# Patient Record
Sex: Female | Born: 1950 | Race: White | Hispanic: No | Smoking: Former smoker
Health system: Southern US, Community
[De-identification: ages and names within clinical notes are randomized; demographics above are authoritative.]

## PROBLEM LIST (undated history)

## (undated) DIAGNOSIS — I6523 Occlusion and stenosis of bilateral carotid arteries: Secondary | ICD-10-CM

## (undated) DIAGNOSIS — I639 Cerebral infarction, unspecified: Secondary | ICD-10-CM

## (undated) DIAGNOSIS — I5189 Other ill-defined heart diseases: Secondary | ICD-10-CM

## (undated) DIAGNOSIS — E785 Hyperlipidemia, unspecified: Secondary | ICD-10-CM

## (undated) DIAGNOSIS — I471 Supraventricular tachycardia: Secondary | ICD-10-CM

## (undated) DIAGNOSIS — F419 Anxiety disorder, unspecified: Secondary | ICD-10-CM

## (undated) DIAGNOSIS — E119 Type 2 diabetes mellitus without complications: Secondary | ICD-10-CM

## (undated) DIAGNOSIS — I259 Chronic ischemic heart disease, unspecified: Secondary | ICD-10-CM

## (undated) DIAGNOSIS — I509 Heart failure, unspecified: Secondary | ICD-10-CM

## (undated) DIAGNOSIS — H409 Unspecified glaucoma: Secondary | ICD-10-CM

## (undated) DIAGNOSIS — I2510 Atherosclerotic heart disease of native coronary artery without angina pectoris: Secondary | ICD-10-CM

## (undated) DIAGNOSIS — I10 Essential (primary) hypertension: Secondary | ICD-10-CM

## (undated) DIAGNOSIS — H3500 Unspecified background retinopathy: Secondary | ICD-10-CM

## (undated) DIAGNOSIS — T7840XA Allergy, unspecified, initial encounter: Secondary | ICD-10-CM

## (undated) DIAGNOSIS — M1990 Unspecified osteoarthritis, unspecified site: Secondary | ICD-10-CM

## (undated) DIAGNOSIS — F32A Depression, unspecified: Secondary | ICD-10-CM

## (undated) DIAGNOSIS — M810 Age-related osteoporosis without current pathological fracture: Secondary | ICD-10-CM

## (undated) DIAGNOSIS — D649 Anemia, unspecified: Secondary | ICD-10-CM

## (undated) DIAGNOSIS — G43909 Migraine, unspecified, not intractable, without status migrainosus: Secondary | ICD-10-CM

## (undated) DIAGNOSIS — F329 Major depressive disorder, single episode, unspecified: Secondary | ICD-10-CM

## (undated) DIAGNOSIS — H269 Unspecified cataract: Secondary | ICD-10-CM

## (undated) DIAGNOSIS — J45909 Unspecified asthma, uncomplicated: Secondary | ICD-10-CM

## (undated) DIAGNOSIS — G5730 Lesion of lateral popliteal nerve, unspecified lower limb: Secondary | ICD-10-CM

## (undated) DIAGNOSIS — Z87440 Personal history of urinary (tract) infections: Secondary | ICD-10-CM

## (undated) DIAGNOSIS — R011 Cardiac murmur, unspecified: Secondary | ICD-10-CM

## (undated) DIAGNOSIS — K219 Gastro-esophageal reflux disease without esophagitis: Secondary | ICD-10-CM

## (undated) HISTORY — DX: Unspecified cataract: H26.9

## (undated) HISTORY — DX: Supraventricular tachycardia: I47.1

## (undated) HISTORY — DX: Unspecified background retinopathy: H35.00

## (undated) HISTORY — DX: Lesion of lateral popliteal nerve, unspecified lower limb: G57.30

## (undated) HISTORY — DX: Atherosclerotic heart disease of native coronary artery without angina pectoris: I25.10

## (undated) HISTORY — DX: Occlusion and stenosis of bilateral carotid arteries: I65.23

## (undated) HISTORY — DX: Depression, unspecified: F32.A

## (undated) HISTORY — DX: Anemia, unspecified: D64.9

## (undated) HISTORY — DX: Major depressive disorder, single episode, unspecified: F32.9

## (undated) HISTORY — PX: VENTRAL HERNIA REPAIR: SHX424

## (undated) HISTORY — DX: Allergy, unspecified, initial encounter: T78.40XA

## (undated) HISTORY — DX: Morbid (severe) obesity due to excess calories: E66.01

## (undated) HISTORY — DX: Essential (primary) hypertension: I10

## (undated) HISTORY — DX: Unspecified asthma, uncomplicated: J45.909

## (undated) HISTORY — DX: Cerebral infarction, unspecified: I63.9

## (undated) HISTORY — PX: CHOLECYSTECTOMY: SHX55

## (undated) HISTORY — PX: RADIOFREQUENCY ABLATION: SHX2290

## (undated) HISTORY — DX: Anxiety disorder, unspecified: F41.9

## (undated) HISTORY — DX: Cardiac murmur, unspecified: R01.1

## (undated) HISTORY — PX: VAGINAL HYSTERECTOMY: SUR661

## (undated) HISTORY — DX: Hypocalcemia: E83.51

## (undated) HISTORY — DX: Type 2 diabetes mellitus without complications: E11.9

## (undated) HISTORY — DX: Chronic ischemic heart disease, unspecified: I25.9

## (undated) HISTORY — DX: Hyperlipidemia, unspecified: E78.5

## (undated) HISTORY — PX: APPENDECTOMY: SHX54

## (undated) HISTORY — DX: Other ill-defined heart diseases: I51.89

## (undated) HISTORY — PX: SMALL INTESTINE SURGERY: SHX150

## (undated) HISTORY — DX: Migraine, unspecified, not intractable, without status migrainosus: G43.909

## (undated) HISTORY — DX: Heart failure, unspecified: I50.9

## (undated) HISTORY — DX: Gastro-esophageal reflux disease without esophagitis: K21.9

## (undated) HISTORY — PX: HERNIA REPAIR: SHX51

## (undated) HISTORY — DX: Age-related osteoporosis without current pathological fracture: M81.0

## (undated) HISTORY — DX: Unspecified glaucoma: H40.9

## (undated) HISTORY — DX: Personal history of urinary (tract) infections: Z87.440

## (undated) NOTE — *Deleted (*Deleted)
[facility] EMERGENCY DEPARTMENT Provider Note   CSN: [id number] Arrival date & time: 11/07/20  0240     History Chief Complaint  Patient presents with  . Altered Mental Status    [name] is a 59 y.o. female.  HPI 50 year old female with history of dementia, unspecified CVA, diabetes mellitus type 2, recurrent UTIs, hypertension, chronic diastolic CHF, AVNRT status post ablation, COPD, hyperlipidemia, bariatric surgery presented from assisted living facility for altered mental status.  This is same presenting chief complaint from admission 11\01\2021.  During that hospitalization patient had acute metabolic encephalopathy with dementia palliative care was consulted.  No source of infection identified.  Today, [facility] assisted living reports patient has been confused for 2 to 3 days.  No report of fall or injury.  Patient is a very poor historian.  She endorses that she does not feel good generally but cannot localize any pain.  She seems uncomfortable with movements and exam and when examining endorses pain in her legs.    Past Medical History:  Diagnosis Date  . Allergy   . Anxiety   . Arthritis   . Asthma   . 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 Bilateral carotid artery occlusion 09/25/2013  . CAD s/p CABG 1994 09/25/2013   LIMA-LAD, SVG Diag, SVG-RCA, [place] Cath 2010: Stents in the proximal LAD with 40% in-stent restenosis and 50% post-stent stenosis, 80% small OM 1, 70% small OM 2, 40% first PLA; diffuse 70% distal RCA; patent SVG to diagonal, patent SVG to distal RCA, patent atretic LIMA EF 55%; normal nuclear stress test 2011  . Cataract   . CHF (congestive heart failure) ([place])   . Chronic anemia   . Depression   . Diabetes mellitus without complication ([place])   . Diastolic dysfunction   . GERD (gastroesophageal reflux disease)   . Glaucoma   . Heart murmur   . History of recurrent UTIs   . Hyperlipidemia   . Hypertension    . Hypocalcemia   . Ischemic heart disease   . Migraine   . Morbid obesity ([place])    s/p gastric bypass  . Osteoporosis   . Peroneal neuropathy   . Retinopathy   . Stroke [facility])     Patient Active Problem List   Diagnosis Date Noted  . AMS (altered mental status) 10/17/2020  . Acute metabolic encephalopathy 97/35/3299  . Hypomagnesemia 10/17/2020  . Rhabdomyolysis 09/01/2017  . Hypokalemia 09/01/2017  . Coffee ground emesis 09/01/2017  . SIRS (systemic inflammatory response syndrome) ([place]) 09/01/2017  . Acute lower UTI 09/01/2017  . AKI (acute kidney injury) ([place]) 09/01/2017  . Chronic pain 09/01/2017  . Hematemesis   . 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 COPD mixed type ([place]) 08/09/2017  . 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 Diabetes mellitus type 2, uncontrolled, without complications 24/26/8341  . Amaurosis fugax of right eye 10/26/2015  . Depression 02/28/2015  . Bilateral leg edema, asymmetrical (R>L) 02/20/2015  . SVT (supraventricular tachycardia) ([place]) 12/31/2014  . Diabetes ([place]) 12/30/2014  . Primary osteoarthritis of right hip 12/27/2014  . Angina decubitus ([place]) 11/08/2014  . Hip joint pain 11/08/2014  . Chronic diastolic heart failure ([place]) 11/08/2014  . Acute on chronic diastolic heart failure ([place]) 09/23/2014  . Protein-calorie malnutrition, severe ([place]) 03/31/2014  . Hypocalcemia 03/30/2014  . Diarrhea 03/30/2014  . Bronchitis, acute 03/30/2014  . CAD s/p CABG 1994 09/25/2013  . Bilateral carotid artery occlusion 09/25/2013  . AVNRT  s/p successful RF ablation 09/25/2013  . H/O gastric bypass 09/25/2013  . Acute encephalopathy 09/25/2013  . History of Diastolic heart failure 09/25/2013  . Iron deficiency anemia 05/28/2012    Past Surgical History:  Procedure Laterality Date  . APPENDECTOMY    . CARDIAC CATHETERIZATION  03/30/2009   3 vessel CAD,patent grafts  . CHOLECYSTECTOMY    . CORONARY ARTERY BYPASS GRAFT  1994    LIM to LAD,SVG to first diagonal,SVG to distal RCA  . GASTRIC BYPASS  2002  . HEEL SPUR EXCISION  2012  . HERNIA REPAIR    . NM MYOCAR PERF WALL MOTION  2011   no ischemia  . RADIOFREQUENCY ABLATION     AVNRT - successful  . SMALL INTESTINE SURGERY    . TOTAL HIP ARTHROPLASTY Right 12/27/2014   Procedure: TOTAL HIP ARTHROPLASTY ANTERIOR APPROACH;  Surgeon: Harvie Junior, MD;  Location: [facility] OR;  Service: Orthopedics;  Laterality: Right;  . TRIGGER FINGER RELEASE  2012  . US ECHOCARDIOGRAPHY  05/09/2010   mild MR,mild mitral and aortic sclerosis  . VAGINAL HYSTERECTOMY    . VENTRAL HERNIA REPAIR       OB History   No obstetric history on file.     Family History  Problem Relation Age of Onset  . Diabetes Brother   . Asthma Brother   . Depression Brother   . Asthma Brother   . Depression Brother   . Diabetes Brother   . Cancer Father        skin  . Alcohol abuse Father   . Arthritis Father   . Depression Father   . Hypertension Father   . Alzheimer's disease Father   . Diabetes Mother   . Heart failure Mother   . Arthritis Mother   . Heart disease Mother   . Asthma Sister   . Heart attack Sister   . Depression Sister   . Diabetes Sister   . Drug abuse Sister   . Heart disease Sister   . Mental illness Sister   . Heart attack Son   . Hypertension Son   . Kidney disease Son        transplant 2003  . Vision loss Son     Social History   Tobacco Use  . Smoking status: Former Smoker    Packs/day: 2.00    Quit date: 12/18/1987    Years since quitting: 32.9  . Smokeless tobacco: Never Used  . Tobacco comment: Pt unsure how many years she smoked total.  Substance Use Topics  . Alcohol use: No  . Drug use: No    Home Medications Prior to Admission medications   Medication Sig Start Date End Date Taking? Authorizing Provider  acetaminophen (TYLENOL) 500 MG tablet Take 1 tablet (500 mg total) by mouth every 6 (six) hours as needed for mild pain. 10/20/20  Yes  Glade Lloyd, MD  aspirin 81 MG tablet Take 81 mg by mouth daily.   Yes [provider]  blood glucose meter kit and supplies KIT Dispense based on patient and insurance preference.  Check blood sugar twice daily as directed. 12/24/16  Yes Dorena Bodo, PA-C  buPROPion (WELLBUTRIN XL) 150 MG 24 hr tablet Take 150 mg by mouth daily.   Yes [provider]  Calcium Citrate-Vitamin D (CALCIUM CITRATE MAXIMUM/VIT D PO) Take 1 tablet by mouth at bedtime. Give 1 tablet by mouth   Yes [provider]  DULoxetine (CYMBALTA) 60 MG capsule Take  60 mg by mouth daily.   Yes [provider]  furosemide (LASIX) 20 MG tablet Take 20 mg by mouth daily. For 3 days   Yes [provider]  glucose blood test strip 1 each by Other route 2 (two) times daily. Dispense based on Insurance and patient preference.  Check blood sugars twice daily as instructed. 12/24/16  Yes Orlena Sheldon, PA-C  Iron, Ferrous Sulfate, 325 (65 Fe) MG TABS Take 325 mg by mouth daily.    Yes [provider]  Multiple Vitamins-Minerals (MULTIVITAMIN WITH MINERALS) tablet Take 1 tablet by mouth daily.   Yes [provider]  nitroGLYCERIN (NITROLINGUAL) 0.4 MG/SPRAY spray USE ONE TO TWO SPRAYS UNDER THE TONGUE EVERY 5 MINUTES AS NEEDED FOR CHEST PAIN Patient taking differently: Place 2 sprays under the tongue every 5 (five) minutes x 3 doses as needed for chest pain.  09/06/14  Yes Croitoru, Mihai, MD  OLANZapine (ZYPREXA) 2.5 MG tablet Take 1 tablet (2.5 mg total) by mouth at bedtime. 10/20/20  Yes Aline August, MD  omeprazole (PRILOSEC) 40 MG capsule Take 40 mg by mouth daily.   Yes [provider]  Skin Protectants, Misc. (BAZA PROTECT EX) Apply 1 application topically. Apply to buttock topically three times a day for skin protection    Yes [provider]  sulfamethoxazole-trimethoprim (BACTRIM DS) 800-160 MG tablet Take 1 tablet by mouth 2 (two) times daily. Pt on day  4 of 7 day suppply   Yes [provider]  vitamin E (VITAMIN E) 180 MG (400 UNITS) capsule Take 400 Units by mouth daily.   Yes [provider]    Allergies    Penicillins, Latex, Metoclopramide hcl, and Neurontin [gabapentin]  Review of Systems   Review of Systems 10 systems reviewed and negative except as per HPI Physical Exam Updated Vital Signs BP (!) 89/65   Pulse 99   Temp 97.7 F (36.5 C) (Oral)   Resp (!) 25   SpO2 100%   Physical Exam Constitutional:      Comments: Patient is pale and thin in appearance.  She awakens to voice.  She seems confused but some speech is situationally appropriate.  No respiratory distress.  HENT:     Mouth/Throat:     Comments: Mucous membrane slightly dry, airway clear. Eyes:     Extraocular Movements: Extraocular movements intact.  Cardiovascular:     Rate and Rhythm: Normal rate and regular rhythm.  Pulmonary:     Comments: No respiratory distress.  Lungs are grossly clear.  Possible crackle left base. Abdominal:     General: There is no distension.     Palpations: Abdomen is soft.     Tenderness: There is no abdominal tenderness. There is no guarding.  Genitourinary:    Comments: Digital rectal exam: No stool in the vault, no impaction. Musculoskeletal:     Comments: Examination of buttock area does not show any skin or tissue breakdown.  Patient has Coban dressing wrap on the lower leg on the right appears to be a wound care dressing.  No swelling above the dressing.  Patient's toes on the right are slightly cool and significant onychomycosis.  Temperature feet however is symmetric.  Bilateral dorsalis pedis pulses present by handheld Doppler device.  Left lower leg has erythema and swelling around the calf and posterior leg.  Small crenulated wound pattern suggestive of previous, significantly larger edema of the extremity.  Left foot slightly cool significant onychomycosis.  Left hand has  what appears to been  previously a skin tear or possibly burn to the dorsum.  Small amount of purulent ooze present.  The hand itself does not have any large amount of swelling or appearance of diffuse cellulitis.  Skin:    General: Skin is warm and dry.     Coloration: Skin is pale.  Neurological:     Comments: Patient is resting.  She awakens to voice.  She can give her full name.  She is not sure which hospital she is in.  Some speech is situationally appropriate but other times wanders and exhibit significant confusion.  She attempts some help at following commands to roll over the stretcher but is very limited by habitus and pain with activity as well as comprehension.  There did not appear to be any focal motor deficits.  There is no flaccid paralysis of any extremities.  If I assist the patient to hold the stretcher rail with each hand she will do so.  She can move her lower extremities.  Overall function however is very poor.     ED Results / Procedures / Treatments   Labs (all labs ordered are listed, but only abnormal results are displayed) Labs Reviewed  COMPREHENSIVE METABOLIC PANEL - Abnormal; Notable for the following components:      Result Value   Potassium 3.3 (*)    CO2 17 (*)    Glucose, Bld 174 (*)    BUN 37 (*)    Creatinine, Ser 2.09 (*)    Calcium 7.6 (*)    Total Protein 3.9 (*)    Albumin 1.6 (*)    AST 45 (*)    ALT 45 (*)    Total Bilirubin 1.3 (*)    GFR, Estimated 25 (*)    All other components within normal limits  CBC WITH DIFFERENTIAL/PLATELET - Abnormal; Notable for the following components:   WBC 19.2 (*)    RBC 3.08 (*)    Hemoglobin 9.4 (*)    HCT 30.0 (*)    Neutro Abs 16.9 (*)    Abs Immature Granulocytes 0.15 (*)    All other components within normal limits  LACTIC ACID, PLASMA - Abnormal; Notable for the following components:   Lactic Acid, Venous 2.5 (*)    All other components within normal limits  PROTIME-INR - Abnormal; Notable for the following  components:   Prothrombin Time 17.1 (*)    INR 1.4 (*)    All other components within normal limits  PHOSPHORUS - Abnormal; Notable for the following components:   Phosphorus 5.8 (*)    All other components within normal limits  I-STAT VENOUS BLOOD GAS, ED - Abnormal; Notable for the following components:   pCO2, Ven 33.3 (*)    pO2, Ven 26.0 (*)    Bicarbonate 19.8 (*)    TCO2 21 (*)    Acid-base deficit 5.0 (*)    Potassium 3.2 (*)    Calcium, Ion 1.13 (*)    HCT 29.0 (*)    Hemoglobin 9.9 (*)    All other components within normal limits  CULTURE, BLOOD (ROUTINE X 2)  CULTURE, BLOOD (ROUTINE X 2)  RESPIRATORY PANEL BY RT PCR (FLU A&B, COVID)  MAGNESIUM  AMMONIA  URINALYSIS, ROUTINE W REFLEX MICROSCOPIC  LACTIC ACID, PLASMA  BLOOD GAS, VENOUS  BRAIN NATRIURETIC PEPTIDE  TROPONIN I (HIGH SENSITIVITY)    EKG EKG Interpretation  Date/Time:  Monday November 07 2020 11:02:15 EST Ventricular Rate:  114 PR Interval:  QRS Duration: 95 QT Interval:  339 QTC Calculation: 467 R Axis:   100 Text Interpretation: Sinus tachycardia Multiple premature complexes, vent & supraven Right axis deviation Low voltage, precordial leads similar to previous, artifact, no appearance of acute ischemia although artifact obscurs inferior leads. Confirmed by Charlesetta Shanks [phone]) on 11/07/2020 12:28:12 PM   Radiology CT Head Wo Contrast  Result Date: 11/07/2020 CLINICAL DATA:  Subdural hematoma. EXAM: CT HEAD WITHOUT CONTRAST TECHNIQUE: Contiguous axial images were obtained from the base of the skull through the vertex without intravenous contrast. COMPARISON:  CT head October 17, 2020. FINDINGS: Brain: Interval decrease in size of the left cerebral convexity low-density extra-axial collection with 6 mm persistent collection anteriorly (for example series 5, images 26 and 28). No substantial mass effect or midline shift. No evidence of acute large vascular territory infarct. Remote right frontal  infarct. Similar generalized cerebral atrophy with patchy white matter hypoattenuation, compatible with chronic microvascular ischemic disease. No hydrocephalus. No mass lesion. Vascular: Calcific atherosclerosis. Skull: No acute fracture. Sinuses/Orbits: Ethmoid air cell mucosal thickening. Otherwise, sinuses are clear. Unremarkable orbits. Other: No mastoid effusions. IMPRESSION: 1. Interval decrease in size of the left cerebral convexity low-density extra-axial collection with 5 mm persistent collection anteriorly. No substantial mass effect or midline shift. 2. 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 Electronically Signed   By: Margaretha Sheffield MD   On: 11/07/2020 10:06   DG Chest Port 1 View  Result Date: 11/07/2020 CLINICAL DATA:  Altered mental status. EXAM: PORTABLE CHEST 1 VIEW COMPARISON:  October 17, 2020. FINDINGS: Stable cardiomediastinal silhouette. Status post coronary bypass graft. No pneumothorax or pleural effusion is noted. Lungs are clear. Bony thorax is unremarkable. IMPRESSION: No active disease. Electronically Signed   By: Marijo Conception M.D.   On: 11/07/2020 11:01    Procedures Procedures (including critical care time) CRITICAL CARE Performed by: Charlesetta Shanks   Total critical care time: 60 minutes  Critical care time was exclusive of separately billable procedures and treating other patients.  Critical care was necessary to treat or prevent imminent or life-threatening deterioration.  Critical care was time spent personally by me on the following activities: development of treatment plan with patient and/or surrogate as well as nursing, discussions with consultants, evaluation of patient's response to treatment, examination of patient, obtaining history from patient or surrogate, ordering and performing treatments and interventions, ordering and review of laboratory studies, ordering and review of radiographic studies, pulse oximetry  and re-evaluation of patient's condition. Medications Ordered in ED Medications  lactated ringers infusion (has no administration in time range)  lactated ringers bolus 1,000 mL (1,000 mLs Intravenous New Bag/Given 11/07/20 1043)    And  lactated ringers bolus 1,000 mL (1,000 mLs Intravenous New Bag/Given 11/07/20 1110)  vancomycin (VANCOREADY) IVPB 1250 mg/250 mL (has no administration in time range)  vancomycin (VANCOREADY) IVPB 500 mg/100 mL (has no administration in time range)  cefTRIAXone (ROCEPHIN) 2 g in sodium chloride 0.9 % 100 mL IVPB (2 g Intravenous New Bag/Given 11/07/20 1049)    ED Course  I have reviewed the triage vital signs and the nursing notes.  Pertinent labs & imaging results that were available during my care of the patient were reviewed by me and considered in my medical decision making (see chart for details).    MDM Rules/Calculators/A&P                         Patient presents  with altered mental status approximately 18 days after last discharge from the hospital.  Repeat CT head does not show any increase of previous identified subdural hematoma.  This is decreased.  No acute findings.  Patient does have hypotension and leukocytosis.  Unclear definitive source of sepsis however patient does have erythema of the left lower extremity consistent with cellulitis and a hand wound with some purulent drainage.  Will initiate sepsis protocol.  BUN and creatinine elevated consistent with AKI.  Patient has significant leukocytosis.  Patient does have history of CHF and has some peripheral edema in the legs.  At this time she does not have significant crackle of the lungs.  Patient does appear to have risk for developing volume overload and CHF, prognosis guarded, palliative care had been consulted on last admission but patient still remains full code.  Plan for admission.  Patient with blood pressure has remained in the 80s systolic after 2-1/2 L rehydration.  Will add  Levophed.  Have consulted first hospitalist for admission but now consulted critical care due to need to start pressor.  His mental status waxes and wanes.  She will talk directly to me and answer questions.  Then she becomes confused and starts exhibiting signs of delirium.  Patient denies pain but seems very uncomfortable when I am moving touch her legs.  I did remove the Coban from her dressing on the right.  Gauze wraps left in place.  These are not tight.  Doppler pulses were confirmed in both feet.  13: 20 patient seen by hospitalist service.  At this time they advised patient his map is 88 and does not need to be in ICU.  Systolic pressure remains 80/68, heart rate low 100s.  Patient has had 3 L of fluid.  Recommendation is for continued hydration as patient is still dehydrated.  They advised they will discuss the case with hospitalist service, and continue to consult as needed.  Will add additional liter fluids to total 4 L.  Final Clinical Impression(s) / ED Diagnoses Final diagnoses:  Sepsis, due to unspecified organism, unspecified whether acute organ dysfunction present (HCC)  AKI (acute kidney injury) (HCC)  Confusion    Rx / DC Orders ED Discharge Orders    None       Arby Barrette, MD 11/07/20 1131    Arby Barrette, MD 11/07/20 1300

---

## 1992-12-17 HISTORY — PX: CORONARY ARTERY BYPASS GRAFT: SHX141

## 1998-06-29 ENCOUNTER — Ambulatory Visit (HOSPITAL_COMMUNITY): Admission: RE | Admit: 1998-06-29 | Discharge: 1998-06-29 | Payer: Self-pay | Admitting: Cardiology

## 1998-06-30 ENCOUNTER — Ambulatory Visit (HOSPITAL_COMMUNITY): Admission: RE | Admit: 1998-06-30 | Discharge: 1998-06-30 | Payer: Self-pay | Admitting: Cardiology

## 1998-10-20 ENCOUNTER — Ambulatory Visit (HOSPITAL_BASED_OUTPATIENT_CLINIC_OR_DEPARTMENT_OTHER): Admission: RE | Admit: 1998-10-20 | Discharge: 1998-10-20 | Payer: Self-pay | Admitting: Orthopedic Surgery

## 1999-02-14 ENCOUNTER — Observation Stay (HOSPITAL_COMMUNITY): Admission: AD | Admit: 1999-02-14 | Discharge: 1999-02-15 | Payer: Self-pay | Admitting: Cardiology

## 1999-03-21 ENCOUNTER — Observation Stay (HOSPITAL_COMMUNITY): Admission: AD | Admit: 1999-03-21 | Discharge: 1999-03-22 | Payer: Self-pay | Admitting: Cardiology

## 1999-05-31 ENCOUNTER — Inpatient Hospital Stay (HOSPITAL_COMMUNITY): Admission: AD | Admit: 1999-05-31 | Discharge: 1999-06-08 | Payer: Self-pay | Admitting: Cardiology

## 1999-06-01 ENCOUNTER — Encounter: Payer: Self-pay | Admitting: Thoracic Surgery (Cardiothoracic Vascular Surgery)

## 1999-06-02 ENCOUNTER — Encounter: Payer: Self-pay | Admitting: Thoracic Surgery (Cardiothoracic Vascular Surgery)

## 1999-06-03 ENCOUNTER — Encounter: Payer: Self-pay | Admitting: Thoracic Surgery (Cardiothoracic Vascular Surgery)

## 1999-06-04 ENCOUNTER — Encounter: Payer: Self-pay | Admitting: Surgery

## 1999-07-17 ENCOUNTER — Ambulatory Visit (HOSPITAL_COMMUNITY): Admission: RE | Admit: 1999-07-17 | Discharge: 1999-07-17 | Payer: Self-pay | Admitting: Cardiology

## 1999-11-01 ENCOUNTER — Encounter
Admission: RE | Admit: 1999-11-01 | Discharge: 1999-11-01 | Payer: Self-pay | Admitting: Thoracic Surgery (Cardiothoracic Vascular Surgery)

## 1999-11-01 ENCOUNTER — Encounter: Payer: Self-pay | Admitting: Thoracic Surgery (Cardiothoracic Vascular Surgery)

## 1999-11-06 ENCOUNTER — Encounter
Admission: RE | Admit: 1999-11-06 | Discharge: 1999-11-06 | Payer: Self-pay | Admitting: Thoracic Surgery (Cardiothoracic Vascular Surgery)

## 1999-11-06 ENCOUNTER — Encounter: Payer: Self-pay | Admitting: Thoracic Surgery (Cardiothoracic Vascular Surgery)

## 1999-12-19 ENCOUNTER — Encounter: Payer: Self-pay | Admitting: Thoracic Surgery (Cardiothoracic Vascular Surgery)

## 1999-12-19 ENCOUNTER — Inpatient Hospital Stay (HOSPITAL_COMMUNITY)
Admission: RE | Admit: 1999-12-19 | Discharge: 1999-12-25 | Payer: Self-pay | Admitting: Thoracic Surgery (Cardiothoracic Vascular Surgery)

## 1999-12-21 ENCOUNTER — Encounter: Payer: Self-pay | Admitting: Thoracic Surgery (Cardiothoracic Vascular Surgery)

## 1999-12-22 ENCOUNTER — Encounter: Payer: Self-pay | Admitting: Thoracic Surgery (Cardiothoracic Vascular Surgery)

## 2000-01-03 ENCOUNTER — Encounter: Admission: RE | Admit: 2000-01-03 | Discharge: 2000-04-02 | Payer: Self-pay | Admitting: Internal Medicine

## 2000-01-17 ENCOUNTER — Encounter: Payer: Self-pay | Admitting: Thoracic Surgery (Cardiothoracic Vascular Surgery)

## 2000-01-17 ENCOUNTER — Encounter
Admission: RE | Admit: 2000-01-17 | Discharge: 2000-01-17 | Payer: Self-pay | Admitting: Thoracic Surgery (Cardiothoracic Vascular Surgery)

## 2000-02-28 ENCOUNTER — Encounter
Admission: RE | Admit: 2000-02-28 | Discharge: 2000-02-28 | Payer: Self-pay | Admitting: Thoracic Surgery (Cardiothoracic Vascular Surgery)

## 2000-02-28 ENCOUNTER — Encounter: Payer: Self-pay | Admitting: Thoracic Surgery (Cardiothoracic Vascular Surgery)

## 2000-06-16 ENCOUNTER — Encounter: Payer: Self-pay | Admitting: Internal Medicine

## 2000-06-16 ENCOUNTER — Ambulatory Visit (HOSPITAL_COMMUNITY): Admission: RE | Admit: 2000-06-16 | Discharge: 2000-06-16 | Payer: Self-pay | Admitting: Internal Medicine

## 2000-06-22 ENCOUNTER — Encounter: Payer: Self-pay | Admitting: Internal Medicine

## 2000-06-22 ENCOUNTER — Ambulatory Visit (HOSPITAL_COMMUNITY): Admission: RE | Admit: 2000-06-22 | Discharge: 2000-06-22 | Payer: Self-pay | Admitting: Internal Medicine

## 2000-06-25 ENCOUNTER — Ambulatory Visit (HOSPITAL_COMMUNITY): Admission: RE | Admit: 2000-06-25 | Discharge: 2000-06-25 | Payer: Self-pay | Admitting: *Deleted

## 2000-07-01 ENCOUNTER — Encounter: Payer: Self-pay | Admitting: Pediatrics

## 2000-07-01 ENCOUNTER — Ambulatory Visit (HOSPITAL_COMMUNITY): Admission: RE | Admit: 2000-07-01 | Discharge: 2000-07-01 | Payer: Self-pay | Admitting: Pediatrics

## 2000-07-05 ENCOUNTER — Ambulatory Visit (HOSPITAL_COMMUNITY): Admission: RE | Admit: 2000-07-05 | Discharge: 2000-07-05 | Payer: Self-pay | Admitting: Pediatrics

## 2000-07-05 ENCOUNTER — Encounter: Payer: Self-pay | Admitting: Pediatrics

## 2000-07-19 ENCOUNTER — Ambulatory Visit (HOSPITAL_COMMUNITY): Admission: RE | Admit: 2000-07-19 | Discharge: 2000-07-19 | Payer: Self-pay | Admitting: Cardiovascular Disease

## 2000-12-17 HISTORY — PX: GASTRIC BYPASS: SHX52

## 2001-02-03 ENCOUNTER — Encounter: Admission: RE | Admit: 2001-02-03 | Discharge: 2001-05-04 | Payer: Self-pay | Admitting: Surgical Oncology

## 2001-03-12 ENCOUNTER — Ambulatory Visit (HOSPITAL_COMMUNITY): Admission: RE | Admit: 2001-03-12 | Discharge: 2001-03-12 | Payer: Self-pay | Admitting: Cardiology

## 2001-06-27 ENCOUNTER — Emergency Department (HOSPITAL_COMMUNITY): Admission: EM | Admit: 2001-06-27 | Discharge: 2001-06-27 | Payer: Self-pay | Admitting: Emergency Medicine

## 2001-07-19 ENCOUNTER — Emergency Department (HOSPITAL_COMMUNITY): Admission: EM | Admit: 2001-07-19 | Discharge: 2001-07-19 | Payer: Self-pay | Admitting: *Deleted

## 2001-07-19 ENCOUNTER — Encounter: Payer: Self-pay | Admitting: *Deleted

## 2001-09-18 ENCOUNTER — Inpatient Hospital Stay (HOSPITAL_COMMUNITY): Admission: RE | Admit: 2001-09-18 | Discharge: 2001-09-28 | Payer: Self-pay | Admitting: Pediatrics

## 2001-09-19 ENCOUNTER — Encounter: Payer: Self-pay | Admitting: Pediatrics

## 2001-09-22 ENCOUNTER — Encounter: Payer: Self-pay | Admitting: Pediatrics

## 2001-09-23 ENCOUNTER — Encounter: Payer: Self-pay | Admitting: Pediatrics

## 2002-01-26 ENCOUNTER — Ambulatory Visit (HOSPITAL_COMMUNITY): Admission: RE | Admit: 2002-01-26 | Discharge: 2002-01-26 | Payer: Self-pay | Admitting: Ophthalmology

## 2002-09-02 ENCOUNTER — Ambulatory Visit (HOSPITAL_COMMUNITY): Admission: RE | Admit: 2002-09-02 | Discharge: 2002-09-02 | Payer: Self-pay | Admitting: Ophthalmology

## 2003-04-04 ENCOUNTER — Emergency Department (HOSPITAL_COMMUNITY): Admission: EM | Admit: 2003-04-04 | Discharge: 2003-04-04 | Payer: Self-pay | Admitting: Emergency Medicine

## 2003-04-05 ENCOUNTER — Encounter: Payer: Self-pay | Admitting: Emergency Medicine

## 2004-07-24 ENCOUNTER — Encounter (INDEPENDENT_AMBULATORY_CARE_PROVIDER_SITE_OTHER): Payer: Self-pay | Admitting: Specialist

## 2004-07-24 ENCOUNTER — Other Ambulatory Visit: Admission: RE | Admit: 2004-07-24 | Discharge: 2004-07-24 | Payer: Self-pay | Admitting: Oncology

## 2004-09-04 ENCOUNTER — Emergency Department (HOSPITAL_COMMUNITY): Admission: EM | Admit: 2004-09-04 | Discharge: 2004-09-04 | Payer: Self-pay | Admitting: Emergency Medicine

## 2004-10-19 ENCOUNTER — Ambulatory Visit: Payer: Self-pay | Admitting: Hematology and Oncology

## 2005-01-02 ENCOUNTER — Ambulatory Visit: Payer: Self-pay | Admitting: Hematology and Oncology

## 2005-06-21 ENCOUNTER — Encounter (INDEPENDENT_AMBULATORY_CARE_PROVIDER_SITE_OTHER): Payer: Self-pay | Admitting: Specialist

## 2005-06-22 ENCOUNTER — Inpatient Hospital Stay (HOSPITAL_COMMUNITY): Admission: RE | Admit: 2005-06-22 | Discharge: 2005-06-26 | Payer: Self-pay | Admitting: Urology

## 2005-12-23 ENCOUNTER — Emergency Department (HOSPITAL_COMMUNITY): Admission: EM | Admit: 2005-12-23 | Discharge: 2005-12-23 | Payer: Self-pay | Admitting: Emergency Medicine

## 2006-02-23 ENCOUNTER — Emergency Department (HOSPITAL_COMMUNITY): Admission: EM | Admit: 2006-02-23 | Discharge: 2006-02-23 | Payer: Self-pay | Admitting: Emergency Medicine

## 2006-06-07 ENCOUNTER — Encounter: Payer: Self-pay | Admitting: Vascular Surgery

## 2006-06-07 ENCOUNTER — Inpatient Hospital Stay (HOSPITAL_COMMUNITY): Admission: EM | Admit: 2006-06-07 | Discharge: 2006-06-08 | Payer: Self-pay | Admitting: Emergency Medicine

## 2006-12-31 ENCOUNTER — Encounter: Admission: RE | Admit: 2006-12-31 | Discharge: 2006-12-31 | Payer: Self-pay | Admitting: Family Medicine

## 2007-02-28 ENCOUNTER — Ambulatory Visit (HOSPITAL_COMMUNITY): Admission: RE | Admit: 2007-02-28 | Discharge: 2007-02-28 | Payer: Self-pay | Admitting: Anesthesiology

## 2007-03-12 ENCOUNTER — Encounter: Admission: RE | Admit: 2007-03-12 | Discharge: 2007-03-12 | Payer: Self-pay | Admitting: Orthopedic Surgery

## 2007-03-18 ENCOUNTER — Encounter: Admission: RE | Admit: 2007-03-18 | Discharge: 2007-03-18 | Payer: Self-pay | Admitting: Orthopedic Surgery

## 2007-08-22 ENCOUNTER — Ambulatory Visit (HOSPITAL_COMMUNITY): Admission: RE | Admit: 2007-08-22 | Discharge: 2007-08-22 | Payer: Self-pay | Admitting: Anesthesiology

## 2007-12-06 ENCOUNTER — Emergency Department (HOSPITAL_COMMUNITY): Admission: EM | Admit: 2007-12-06 | Discharge: 2007-12-06 | Payer: Self-pay | Admitting: Emergency Medicine

## 2007-12-19 ENCOUNTER — Ambulatory Visit: Payer: Self-pay | Admitting: Hematology and Oncology

## 2007-12-31 LAB — CBC & DIFF AND RETIC
BASO%: 0.3 % (ref 0.0–2.0)
EOS%: 0.6 % (ref 0.0–7.0)
Eosinophils Absolute: 0 10*3/uL (ref 0.0–0.5)
MCH: 24.2 pg — ABNORMAL LOW (ref 26.0–34.0)
MCHC: 33 g/dL (ref 32.0–36.0)
MCV: 73.2 fL — ABNORMAL LOW (ref 81.0–101.0)
MONO%: 6.2 % (ref 0.0–13.0)
NEUT#: 4.5 10*3/uL (ref 1.5–6.5)
RBC: 3.97 10*6/uL (ref 3.70–5.32)
RDW: 18.2 % — ABNORMAL HIGH (ref 11.3–14.5)
RETIC #: 54.8 10*3/uL (ref 19.7–115.1)
Retic %: 1.4 % (ref 0.4–2.3)

## 2007-12-31 LAB — COMPREHENSIVE METABOLIC PANEL
AST: 21 U/L (ref 0–37)
Alkaline Phosphatase: 203 U/L — ABNORMAL HIGH (ref 39–117)
BUN: 11 mg/dL (ref 6–23)
Creatinine, Ser: 0.63 mg/dL (ref 0.40–1.20)
Total Bilirubin: 1 mg/dL (ref 0.3–1.2)

## 2007-12-31 LAB — IRON AND TIBC
%SAT: 6 % — ABNORMAL LOW (ref 20–55)
Iron: 24 ug/dL — ABNORMAL LOW (ref 42–145)
TIBC: 404 ug/dL (ref 250–470)
UIBC: 380 ug/dL

## 2007-12-31 LAB — FERRITIN: Ferritin: 5 ng/mL — ABNORMAL LOW (ref 10–291)

## 2008-01-20 ENCOUNTER — Ambulatory Visit: Payer: Self-pay | Admitting: Internal Medicine

## 2008-01-20 ENCOUNTER — Inpatient Hospital Stay (HOSPITAL_COMMUNITY): Admission: EM | Admit: 2008-01-20 | Discharge: 2008-01-22 | Payer: Self-pay | Admitting: Emergency Medicine

## 2008-02-02 LAB — BASIC METABOLIC PANEL
BUN: 14 mg/dL (ref 6–23)
Calcium: 8.9 mg/dL (ref 8.4–10.5)
Chloride: 107 mEq/L (ref 96–112)
Creatinine, Ser: 0.68 mg/dL (ref 0.40–1.20)

## 2008-02-02 LAB — IRON AND TIBC
%SAT: 34 % (ref 20–55)
Iron: 86 ug/dL (ref 42–145)

## 2008-02-02 LAB — CBC WITH DIFFERENTIAL/PLATELET
Basophils Absolute: 0 10*3/uL (ref 0.0–0.1)
EOS%: 0.4 % (ref 0.0–7.0)
HCT: 36 % (ref 34.8–46.6)
HGB: 11.8 g/dL (ref 11.6–15.9)
LYMPH%: 32.4 % (ref 14.0–48.0)
MCH: 25.5 pg — ABNORMAL LOW (ref 26.0–34.0)
MCV: 78 fL — ABNORMAL LOW (ref 81.0–101.0)
NEUT%: 62 % (ref 39.6–76.8)
Platelets: 391 10*3/uL (ref 145–400)
lymph#: 3.1 10*3/uL (ref 0.9–3.3)

## 2008-02-02 LAB — FERRITIN: Ferritin: 429 ng/mL — ABNORMAL HIGH (ref 10–291)

## 2008-02-04 ENCOUNTER — Ambulatory Visit: Payer: Self-pay | Admitting: Hematology and Oncology

## 2008-02-06 ENCOUNTER — Emergency Department (HOSPITAL_COMMUNITY): Admission: EM | Admit: 2008-02-06 | Discharge: 2008-02-06 | Payer: Self-pay | Admitting: Emergency Medicine

## 2008-04-26 ENCOUNTER — Ambulatory Visit: Payer: Self-pay | Admitting: Hematology and Oncology

## 2008-07-26 ENCOUNTER — Ambulatory Visit: Payer: Self-pay | Admitting: Hematology and Oncology

## 2008-08-03 LAB — CBC WITH DIFFERENTIAL/PLATELET
Basophils Absolute: 0 10*3/uL (ref 0.0–0.1)
Eosinophils Absolute: 0 10*3/uL (ref 0.0–0.5)
HGB: 11.5 g/dL — ABNORMAL LOW (ref 11.6–15.9)
MCV: 93.9 fL (ref 81.0–101.0)
MONO#: 0.3 10*3/uL (ref 0.1–0.9)
MONO%: 4.3 % (ref 0.0–13.0)
NEUT#: 4.1 10*3/uL (ref 1.5–6.5)
RBC: 3.59 10*6/uL — ABNORMAL LOW (ref 3.70–5.32)
RDW: 13.4 % (ref 11.3–14.5)
WBC: 6.4 10*3/uL (ref 3.9–10.0)
lymph#: 1.9 10*3/uL (ref 0.9–3.3)

## 2008-08-04 LAB — BASIC METABOLIC PANEL
BUN: 15 mg/dL (ref 6–23)
Calcium: 8 mg/dL — ABNORMAL LOW (ref 8.4–10.5)
Creatinine, Ser: 0.73 mg/dL (ref 0.40–1.20)

## 2008-08-04 LAB — IRON AND TIBC
Iron: 82 ug/dL (ref 42–145)
TIBC: 274 ug/dL (ref 250–470)
UIBC: 192 ug/dL

## 2009-01-28 ENCOUNTER — Ambulatory Visit: Payer: Self-pay | Admitting: Hematology and Oncology

## 2009-02-01 LAB — CBC WITH DIFFERENTIAL/PLATELET
Eosinophils Absolute: 0 10*3/uL (ref 0.0–0.5)
HCT: 33.4 % — ABNORMAL LOW (ref 34.8–46.6)
LYMPH%: 20 % (ref 14.0–48.0)
MCHC: 34.1 g/dL (ref 32.0–36.0)
MCV: 95.1 fL (ref 81.0–101.0)
MONO%: 4.9 % (ref 0.0–13.0)
NEUT#: 4.3 10*3/uL (ref 1.5–6.5)
NEUT%: 74.4 % (ref 39.6–76.8)
Platelets: 150 10*3/uL (ref 145–400)
RBC: 3.51 10*6/uL — ABNORMAL LOW (ref 3.70–5.32)

## 2009-02-01 LAB — IRON AND TIBC
%SAT: 20 % (ref 20–55)
TIBC: 323 ug/dL (ref 250–470)

## 2009-02-01 LAB — BASIC METABOLIC PANEL
Chloride: 108 mEq/L (ref 96–112)
Sodium: 139 mEq/L (ref 135–145)

## 2009-02-01 LAB — VITAMIN B12: Vitamin B-12: 603 pg/mL (ref 211–911)

## 2009-03-29 ENCOUNTER — Inpatient Hospital Stay (HOSPITAL_COMMUNITY): Admission: AD | Admit: 2009-03-29 | Discharge: 2009-03-31 | Payer: Self-pay | Admitting: Cardiology

## 2009-03-30 HISTORY — PX: CARDIAC CATHETERIZATION: SHX172

## 2009-04-06 ENCOUNTER — Encounter: Admission: RE | Admit: 2009-04-06 | Discharge: 2009-04-06 | Payer: Self-pay | Admitting: Cardiology

## 2009-05-31 ENCOUNTER — Ambulatory Visit: Payer: Self-pay | Admitting: Hematology and Oncology

## 2009-06-02 LAB — CBC WITH DIFFERENTIAL/PLATELET
Eosinophils Absolute: 0 10*3/uL (ref 0.0–0.5)
MCV: 98.3 fL (ref 79.5–101.0)
MONO#: 0.4 10*3/uL (ref 0.1–0.9)
MONO%: 6.5 % (ref 0.0–14.0)
NEUT#: 4.4 10*3/uL (ref 1.5–6.5)
RBC: 3.16 10*6/uL — ABNORMAL LOW (ref 3.70–5.45)
RDW: 13.3 % (ref 11.2–14.5)
WBC: 6.8 10*3/uL (ref 3.9–10.3)
lymph#: 1.9 10*3/uL (ref 0.9–3.3)

## 2009-06-02 LAB — IRON AND TIBC
%SAT: 29 % (ref 20–55)
Iron: 69 ug/dL (ref 42–145)
TIBC: 239 ug/dL — ABNORMAL LOW (ref 250–470)

## 2009-06-02 LAB — BASIC METABOLIC PANEL
Calcium: 6.6 mg/dL — ABNORMAL LOW (ref 8.4–10.5)
Creatinine, Ser: 0.86 mg/dL (ref 0.40–1.20)
Sodium: 141 mEq/L (ref 135–145)

## 2009-06-22 LAB — VITAMIN B12: Vitamin B-12: 972 pg/mL — ABNORMAL HIGH (ref 211–911)

## 2009-08-03 ENCOUNTER — Encounter: Admission: RE | Admit: 2009-08-03 | Discharge: 2009-08-03 | Payer: Self-pay | Admitting: Family Medicine

## 2009-08-10 ENCOUNTER — Ambulatory Visit: Payer: Self-pay | Admitting: Vascular Surgery

## 2009-08-12 ENCOUNTER — Ambulatory Visit: Payer: Self-pay | Admitting: Vascular Surgery

## 2009-08-12 ENCOUNTER — Ambulatory Visit (HOSPITAL_COMMUNITY): Admission: RE | Admit: 2009-08-12 | Discharge: 2009-08-12 | Payer: Self-pay | Admitting: Vascular Surgery

## 2009-12-17 HISTORY — PX: NM MYOCAR PERF WALL MOTION: HXRAD629

## 2010-01-03 ENCOUNTER — Ambulatory Visit (HOSPITAL_COMMUNITY): Admission: RE | Admit: 2010-01-03 | Discharge: 2010-01-03 | Payer: Self-pay | Admitting: Anesthesiology

## 2010-01-04 ENCOUNTER — Ambulatory Visit (HOSPITAL_COMMUNITY): Admission: RE | Admit: 2010-01-04 | Discharge: 2010-01-04 | Payer: Self-pay | Admitting: Anesthesiology

## 2010-01-06 ENCOUNTER — Ambulatory Visit: Payer: Self-pay | Admitting: Hematology and Oncology

## 2010-01-10 LAB — BASIC METABOLIC PANEL
BUN: 17 mg/dL (ref 6–23)
CO2: 24 mEq/L (ref 19–32)
Calcium: 7.4 mg/dL — ABNORMAL LOW (ref 8.4–10.5)
Chloride: 106 mEq/L (ref 96–112)
Creatinine, Ser: 0.67 mg/dL (ref 0.40–1.20)

## 2010-01-10 LAB — CBC WITH DIFFERENTIAL/PLATELET
HCT: 35 % (ref 34.8–46.6)
HGB: 11.9 g/dL (ref 11.6–15.9)
MCH: 34.8 pg — ABNORMAL HIGH (ref 25.1–34.0)
MCHC: 34.1 g/dL (ref 31.5–36.0)
MONO#: 0.3 10*3/uL (ref 0.1–0.9)
NEUT#: 4.3 10*3/uL (ref 1.5–6.5)
Platelets: 178 10*3/uL (ref 145–400)
RDW: 13.6 % (ref 11.2–14.5)
WBC: 6.3 10*3/uL (ref 3.9–10.3)

## 2010-01-10 LAB — IRON AND TIBC: Iron: 96 ug/dL (ref 42–145)

## 2010-01-10 LAB — FERRITIN: Ferritin: 193 ng/mL (ref 10–291)

## 2010-01-17 LAB — PROTEIN ELECTROPHORESIS, SERUM, WITH REFLEX
Alpha-2-Globulin: 8.4 % (ref 7.1–11.8)
Beta 2: 8.7 % — ABNORMAL HIGH (ref 3.2–6.5)

## 2010-01-19 ENCOUNTER — Ambulatory Visit (HOSPITAL_COMMUNITY): Admission: RE | Admit: 2010-01-19 | Discharge: 2010-01-19 | Payer: Self-pay | Admitting: Hematology and Oncology

## 2010-04-25 ENCOUNTER — Encounter: Admission: RE | Admit: 2010-04-25 | Discharge: 2010-04-25 | Payer: Self-pay | Admitting: *Deleted

## 2010-05-05 ENCOUNTER — Emergency Department (HOSPITAL_COMMUNITY): Admission: EM | Admit: 2010-05-05 | Discharge: 2010-05-05 | Payer: Self-pay | Admitting: Emergency Medicine

## 2010-05-09 HISTORY — PX: US ECHOCARDIOGRAPHY: HXRAD669

## 2010-06-14 ENCOUNTER — Ambulatory Visit: Payer: Self-pay | Admitting: Hematology and Oncology

## 2010-06-16 LAB — CBC WITH DIFFERENTIAL/PLATELET
BASO%: 0.2 % (ref 0.0–2.0)
Basophils Absolute: 0 10*3/uL (ref 0.0–0.1)
EOS%: 0.6 % (ref 0.0–7.0)
Eosinophils Absolute: 0 10*3/uL (ref 0.0–0.5)
HGB: 11.3 g/dL — ABNORMAL LOW (ref 11.6–15.9)
LYMPH%: 27.3 % (ref 14.0–49.7)
MCHC: 34.1 g/dL (ref 31.5–36.0)
MONO%: 6.3 % (ref 0.0–14.0)
NEUT#: 4.5 10*3/uL (ref 1.5–6.5)
NEUT%: 65.6 % (ref 38.4–76.8)

## 2010-06-16 LAB — LACTATE DEHYDROGENASE: LDH: 152 U/L (ref 94–250)

## 2010-06-16 LAB — COMPREHENSIVE METABOLIC PANEL
ALT: 21 U/L (ref 0–35)
Albumin: 3.5 g/dL (ref 3.5–5.2)
CO2: 20 mEq/L (ref 19–32)
Calcium: 7.4 mg/dL — ABNORMAL LOW (ref 8.4–10.5)
Chloride: 111 mEq/L (ref 96–112)
Glucose, Bld: 106 mg/dL — ABNORMAL HIGH (ref 70–99)
Sodium: 141 mEq/L (ref 135–145)
Total Protein: 5.9 g/dL — ABNORMAL LOW (ref 6.0–8.3)

## 2010-06-16 LAB — IRON AND TIBC: Iron: 46 ug/dL (ref 42–145)

## 2010-08-07 ENCOUNTER — Ambulatory Visit: Payer: Self-pay | Admitting: Cardiology

## 2010-12-13 ENCOUNTER — Ambulatory Visit: Payer: Self-pay | Admitting: Hematology and Oncology

## 2010-12-17 HISTORY — PX: TRIGGER FINGER RELEASE: SHX641

## 2010-12-17 HISTORY — PX: HEEL SPUR EXCISION: SHX1733

## 2010-12-20 LAB — IRON AND TIBC
%SAT: 28 % (ref 20–55)
Iron: 72 ug/dL (ref 42–145)
TIBC: 255 ug/dL (ref 250–470)
UIBC: 183 ug/dL

## 2010-12-20 LAB — CBC WITH DIFFERENTIAL/PLATELET
BASO%: 0.2 % (ref 0.0–2.0)
Basophils Absolute: 0 10*3/uL (ref 0.0–0.1)
EOS%: 0.7 % (ref 0.0–7.0)
Eosinophils Absolute: 0 10*3/uL (ref 0.0–0.5)
HCT: 35.1 % (ref 34.8–46.6)
HGB: 11.4 g/dL — ABNORMAL LOW (ref 11.6–15.9)
LYMPH%: 31.9 % (ref 14.0–49.7)
MCH: 31.2 pg (ref 25.1–34.0)
MCHC: 32.5 g/dL (ref 31.5–36.0)
MCV: 96.2 fL (ref 79.5–101.0)
MONO#: 0.4 10*3/uL (ref 0.1–0.9)
MONO%: 6.7 % (ref 0.0–14.0)
NEUT#: 3.7 10*3/uL (ref 1.5–6.5)
NEUT%: 60.5 % (ref 38.4–76.8)
Platelets: 166 10*3/uL (ref 145–400)
RBC: 3.65 10*6/uL — ABNORMAL LOW (ref 3.70–5.45)
RDW: 12.4 % (ref 11.2–14.5)
WBC: 6.1 10*3/uL (ref 3.9–10.3)
lymph#: 2 10*3/uL (ref 0.9–3.3)

## 2010-12-20 LAB — COMPREHENSIVE METABOLIC PANEL
ALT: 16 U/L (ref 0–35)
AST: 20 U/L (ref 0–37)
Albumin: 3.6 g/dL (ref 3.5–5.2)
Alkaline Phosphatase: 272 U/L — ABNORMAL HIGH (ref 39–117)
BUN: 10 mg/dL (ref 6–23)
CO2: 23 mEq/L (ref 19–32)
Calcium: 6.6 mg/dL — ABNORMAL LOW (ref 8.4–10.5)
Chloride: 105 mEq/L (ref 96–112)
Creatinine, Ser: 0.47 mg/dL (ref 0.40–1.20)
Glucose, Bld: 104 mg/dL — ABNORMAL HIGH (ref 70–99)
Potassium: 4.1 mEq/L (ref 3.5–5.3)
Sodium: 136 mEq/L (ref 135–145)
Total Bilirubin: 0.7 mg/dL (ref 0.3–1.2)
Total Protein: 6.1 g/dL (ref 6.0–8.3)

## 2010-12-20 LAB — VITAMIN B12: Vitamin B-12: 540 pg/mL (ref 211–911)

## 2010-12-20 LAB — FERRITIN: Ferritin: 106 ng/mL (ref 10–291)

## 2011-01-07 ENCOUNTER — Encounter: Payer: Self-pay | Admitting: Anesthesiology

## 2011-01-07 ENCOUNTER — Encounter: Payer: Self-pay | Admitting: Urology

## 2011-02-07 ENCOUNTER — Ambulatory Visit: Payer: Self-pay | Admitting: Cardiology

## 2011-03-05 LAB — CBC
HCT: 31.5 % — ABNORMAL LOW (ref 36.0–46.0)
Hemoglobin: 10.8 g/dL — ABNORMAL LOW (ref 12.0–15.0)
MCV: 100.7 fL — ABNORMAL HIGH (ref 78.0–100.0)
RBC: 3.13 MIL/uL — ABNORMAL LOW (ref 3.87–5.11)
WBC: 5.8 10*3/uL (ref 4.0–10.5)

## 2011-03-05 LAB — POCT I-STAT, CHEM 8
BUN: 12 mg/dL (ref 6–23)
Chloride: 110 mEq/L (ref 96–112)
Creatinine, Ser: 0.4 mg/dL (ref 0.4–1.2)
Potassium: 4.1 mEq/L (ref 3.5–5.1)
Sodium: 140 mEq/L (ref 135–145)
TCO2: 22 mmol/L (ref 0–100)

## 2011-03-05 LAB — POCT CARDIAC MARKERS
CKMB, poc: <1 ng/mL — ABNORMAL LOW (ref 1.0–8.0)
Myoglobin, poc: 31.7 ng/mL (ref 12–200)
Troponin i, poc: <0.05 ng/mL (ref 0.00–0.09)

## 2011-03-05 LAB — DIFFERENTIAL
Basophils Relative: 0 % (ref 0–1)
Lymphocytes Relative: 23 % (ref 12–46)
Lymphs Abs: 1.3 10*3/uL (ref 0.7–4.0)
Monocytes Relative: 5 % (ref 3–12)
Neutro Abs: 4.2 10*3/uL (ref 1.7–7.7)
Neutrophils Relative %: 72 % (ref 43–77)

## 2011-03-05 LAB — GLUCOSE, CAPILLARY: Glucose-Capillary: 136 mg/dL — ABNORMAL HIGH (ref 70–99)

## 2011-03-24 LAB — POCT I-STAT, CHEM 8
Creatinine, Ser: 0.7 mg/dL (ref 0.4–1.2)
HCT: 35 % — ABNORMAL LOW (ref 36.0–46.0)
Hemoglobin: 11.9 g/dL — ABNORMAL LOW (ref 12.0–15.0)
Sodium: 140 mEq/L (ref 135–145)
TCO2: 22 mmol/L (ref 0–100)

## 2011-03-27 ENCOUNTER — Telehealth: Payer: Self-pay | Admitting: Cardiology

## 2011-03-27 NOTE — Telephone Encounter (Signed)
[facility] Fax: [phone]. Office notes, EKG, Stress test, Echo, Cath, H&P

## 2011-03-28 LAB — COMPREHENSIVE METABOLIC PANEL
BUN: 12 mg/dL (ref 6–23)
CO2: 26 mEq/L (ref 19–32)
Chloride: 110 mEq/L (ref 96–112)
Creatinine, Ser: 0.7 mg/dL (ref 0.4–1.2)
GFR calc non Af Amer: >60 mL/min (ref >60)
Total Bilirubin: 0.7 mg/dL (ref 0.3–1.2)

## 2011-03-28 LAB — CBC
HCT: 37.6 % (ref 36.0–46.0)
Hemoglobin: 10.1 g/dL — ABNORMAL LOW (ref 12.0–15.0)
MCV: 96.9 fL (ref 78.0–100.0)
Platelets: 141 10*3/uL — ABNORMAL LOW (ref 150–400)
RBC: 3.06 MIL/uL — ABNORMAL LOW (ref 3.87–5.11)
RBC: 3.88 MIL/uL (ref 3.87–5.11)
RDW: 14 % (ref 11.5–15.5)
WBC: 5.4 10*3/uL (ref 4.0–10.5)
WBC: 5.5 10*3/uL (ref 4.0–10.5)

## 2011-03-28 LAB — CARDIAC PANEL(CRET KIN+CKTOT+MB+TROPI)
CK, MB: 1.7 ng/mL (ref 0.3–4.0)
Relative Index: INVALID (ref 0.0–2.5)
Total CK: 91 U/L (ref 7–177)
Troponin I: <0.01 ng/mL (ref 0.00–0.06)
Troponin I: <0.01 ng/mL (ref 0.00–0.06)

## 2011-03-28 LAB — GLUCOSE, CAPILLARY
Glucose-Capillary: 120 mg/dL — ABNORMAL HIGH (ref 70–99)
Glucose-Capillary: 94 mg/dL (ref 70–99)

## 2011-03-28 LAB — PROTIME-INR
INR: 1.5 (ref 0.00–1.49)
Prothrombin Time: 18.8 seconds — ABNORMAL HIGH (ref 11.6–15.2)

## 2011-03-28 LAB — APTT: aPTT: 31 seconds (ref 24–37)

## 2011-04-24 ENCOUNTER — Emergency Department (HOSPITAL_COMMUNITY): Payer: Federal, State, Local not specified - PPO

## 2011-04-24 ENCOUNTER — Inpatient Hospital Stay (HOSPITAL_COMMUNITY)
Admission: EM | Admit: 2011-04-24 | Discharge: 2011-04-27 | DRG: 551 | Disposition: A | Discharge status: Home / Self Care | Payer: Federal, State, Local not specified - PPO | Attending: Internal Medicine | Admitting: Internal Medicine

## 2011-04-24 DIAGNOSIS — Z9884 Bariatric surgery status: Secondary | ICD-10-CM

## 2011-04-24 DIAGNOSIS — K7689 Other specified diseases of liver: Secondary | ICD-10-CM | POA: Diagnosis present

## 2011-04-24 DIAGNOSIS — H3500 Unspecified background retinopathy: Secondary | ICD-10-CM | POA: Diagnosis present

## 2011-04-24 DIAGNOSIS — E119 Type 2 diabetes mellitus without complications: Secondary | ICD-10-CM | POA: Diagnosis present

## 2011-04-24 DIAGNOSIS — E876 Hypokalemia: Secondary | ICD-10-CM | POA: Diagnosis present

## 2011-04-24 DIAGNOSIS — F341 Dysthymic disorder: Secondary | ICD-10-CM | POA: Diagnosis present

## 2011-04-24 DIAGNOSIS — I2589 Other forms of chronic ischemic heart disease: Secondary | ICD-10-CM | POA: Diagnosis present

## 2011-04-24 DIAGNOSIS — I5032 Chronic diastolic (congestive) heart failure: Secondary | ICD-10-CM | POA: Diagnosis present

## 2011-04-24 DIAGNOSIS — G609 Hereditary and idiopathic neuropathy, unspecified: Secondary | ICD-10-CM | POA: Diagnosis present

## 2011-04-24 DIAGNOSIS — Z8673 Personal history of transient ischemic attack (TIA), and cerebral infarction without residual deficits: Secondary | ICD-10-CM

## 2011-04-24 DIAGNOSIS — I509 Heart failure, unspecified: Secondary | ICD-10-CM | POA: Diagnosis present

## 2011-04-24 DIAGNOSIS — Z87440 Personal history of urinary (tract) infections: Secondary | ICD-10-CM

## 2011-04-24 DIAGNOSIS — A088 Other specified intestinal infections: Principal | ICD-10-CM | POA: Diagnosis present

## 2011-04-24 DIAGNOSIS — M542 Cervicalgia: Secondary | ICD-10-CM | POA: Diagnosis present

## 2011-04-24 DIAGNOSIS — F1120 Opioid dependence, uncomplicated: Secondary | ICD-10-CM | POA: Diagnosis present

## 2011-04-24 DIAGNOSIS — I739 Peripheral vascular disease, unspecified: Secondary | ICD-10-CM | POA: Diagnosis present

## 2011-04-24 DIAGNOSIS — E440 Moderate protein-calorie malnutrition: Secondary | ICD-10-CM | POA: Diagnosis present

## 2011-04-24 DIAGNOSIS — G8929 Other chronic pain: Secondary | ICD-10-CM | POA: Diagnosis present

## 2011-04-24 DIAGNOSIS — I2510 Atherosclerotic heart disease of native coronary artery without angina pectoris: Secondary | ICD-10-CM | POA: Diagnosis present

## 2011-04-24 DIAGNOSIS — M549 Dorsalgia, unspecified: Secondary | ICD-10-CM | POA: Diagnosis present

## 2011-04-24 LAB — COMPREHENSIVE METABOLIC PANEL
BUN: 6 mg/dL (ref 6–23)
CO2: 24 mEq/L (ref 19–32)
Calcium: 7.8 mg/dL — ABNORMAL LOW (ref 8.4–10.5)
Creatinine, Ser: 0.81 mg/dL (ref 0.4–1.2)
GFR calc Af Amer: >60 mL/min (ref >60)
GFR calc non Af Amer: >60 mL/min (ref >60)
Glucose, Bld: 97 mg/dL (ref 70–99)

## 2011-04-24 LAB — CBC
HCT: 34.3 % — ABNORMAL LOW (ref 36.0–46.0)
Hemoglobin: 12 g/dL (ref 12.0–15.0)
MCH: 31.3 pg (ref 26.0–34.0)
MCHC: 35 g/dL (ref 30.0–36.0)
MCV: 89.6 fL (ref 78.0–100.0)

## 2011-04-24 LAB — URINALYSIS, ROUTINE W REFLEX MICROSCOPIC
Bilirubin Urine: NEGATIVE
Glucose, UA: NEGATIVE mg/dL
Ketones, ur: NEGATIVE mg/dL
pH: 6.5 (ref 5.0–8.0)

## 2011-04-24 LAB — CLOSTRIDIUM DIFFICILE BY PCR: Toxigenic C. Difficile by PCR: NEGATIVE

## 2011-04-24 LAB — LIPASE, BLOOD: Lipase: 25 U/L (ref 11–59)

## 2011-04-24 LAB — DIFFERENTIAL
Basophils Absolute: 0 10*3/uL (ref 0.0–0.1)
Lymphs Abs: 0.7 10*3/uL (ref 0.7–4.0)
Monocytes Relative: 4 % (ref 3–12)
Neutro Abs: 6.4 10*3/uL (ref 1.7–7.7)

## 2011-04-24 LAB — GLUCOSE, CAPILLARY
Glucose-Capillary: 117 mg/dL — ABNORMAL HIGH (ref 70–99)
Glucose-Capillary: 65 mg/dL — ABNORMAL LOW (ref 70–99)

## 2011-04-24 MED ORDER — IOHEXOL 300 MG/ML  SOLN
80.0000 mL | Freq: Once | INTRAMUSCULAR | Status: AC | PRN
  Administered 2011-04-24: 80 mL via INTRAVENOUS

## 2011-04-25 LAB — COMPREHENSIVE METABOLIC PANEL
AST: 12 U/L (ref 0–37)
CO2: 22 mEq/L (ref 19–32)
Chloride: 114 mEq/L — ABNORMAL HIGH (ref 96–112)
Creatinine, Ser: 0.75 mg/dL (ref 0.4–1.2)
GFR calc Af Amer: >60 mL/min (ref >60)
GFR calc non Af Amer: >60 mL/min (ref >60)
Total Bilirubin: 0.2 mg/dL — ABNORMAL LOW (ref 0.3–1.2)

## 2011-04-25 LAB — LIPID PANEL
HDL: 20 mg/dL — ABNORMAL LOW (ref >39)
Total CHOL/HDL Ratio: 2.6 RATIO
Triglycerides: 47 mg/dL (ref <150)
VLDL: 9 mg/dL (ref 0–40)

## 2011-04-25 LAB — CBC
Hemoglobin: 9.5 g/dL — ABNORMAL LOW (ref 12.0–15.0)
MCH: 30.4 pg (ref 26.0–34.0)
MCHC: 33.8 g/dL (ref 30.0–36.0)
Platelets: 163 10*3/uL (ref 150–400)

## 2011-04-25 LAB — CARDIAC PANEL(CRET KIN+CKTOT+MB+TROPI)
Total CK: 88 U/L (ref 7–177)
Troponin I: <0.3 ng/mL (ref <0.30)

## 2011-04-25 LAB — MAGNESIUM: Magnesium: 1.7 mg/dL (ref 1.5–2.5)

## 2011-04-25 LAB — GLUCOSE, CAPILLARY: Glucose-Capillary: 91 mg/dL (ref 70–99)

## 2011-04-26 LAB — CBC
Hemoglobin: 11.3 g/dL — ABNORMAL LOW (ref 12.0–15.0)
MCHC: 34.2 g/dL (ref 30.0–36.0)
Platelets: 168 10*3/uL (ref 150–400)
RDW: 13.8 % (ref 11.5–15.5)

## 2011-04-26 LAB — MAGNESIUM
Magnesium: 1.5 mg/dL (ref 1.5–2.5)
Magnesium: 1.6 mg/dL (ref 1.5–2.5)

## 2011-04-26 LAB — BASIC METABOLIC PANEL
Calcium: 7.9 mg/dL — ABNORMAL LOW (ref 8.4–10.5)
GFR calc Af Amer: >60 mL/min (ref >60)
GFR calc non Af Amer: >60 mL/min (ref >60)
Sodium: 140 mEq/L (ref 135–145)

## 2011-04-26 LAB — POTASSIUM: Potassium: 3.5 mEq/L (ref 3.5–5.1)

## 2011-04-27 LAB — CBC
HCT: 32.5 % — ABNORMAL LOW (ref 36.0–46.0)
MCV: 89.8 fL (ref 78.0–100.0)
Platelets: 181 10*3/uL (ref 150–400)
Platelets: 159 10*3/uL (ref 150–400)
RBC: 3.59 MIL/uL — ABNORMAL LOW (ref 3.87–5.11)
RBC: 3.62 MIL/uL — ABNORMAL LOW (ref 3.87–5.11)
RDW: 13.6 % (ref 11.5–15.5)
WBC: 7.1 10*3/uL (ref 4.0–10.5)
WBC: 6.6 10*3/uL (ref 4.0–10.5)

## 2011-04-27 LAB — BASIC METABOLIC PANEL
Chloride: 108 mEq/L (ref 96–112)
Creatinine, Ser: 0.58 mg/dL (ref 0.4–1.2)
GFR calc Af Amer: >60 mL/min (ref >60)
GFR calc non Af Amer: >60 mL/min (ref >60)
Potassium: 3.5 mEq/L (ref 3.5–5.1)

## 2011-04-28 LAB — STOOL CULTURE

## 2011-04-30 ENCOUNTER — Other Ambulatory Visit: Payer: Self-pay | Admitting: *Deleted

## 2011-04-30 DIAGNOSIS — I2510 Atherosclerotic heart disease of native coronary artery without angina pectoris: Secondary | ICD-10-CM

## 2011-04-30 MED ORDER — ISOSORBIDE MONONITRATE ER 30 MG PO TB24: 30.0000 mg | ORAL_TABLET | Freq: Every day | ORAL | Status: DC

## 2011-04-30 MED ORDER — NITROGLYCERIN 0.4 MG SL SUBL: 0.4000 mg | SUBLINGUAL_TABLET | SUBLINGUAL | Status: DC | PRN

## 2011-05-01 NOTE — Consult Note (Signed)
[name], [name] NO.:  [id number]   MEDICAL RECORD NO.:  [id number]          PATIENT TYPE:  INP   LOCATION:  1514                         FACILITY:  [facility]   PHYSICIAN:  Ardeth Sportsman, MD     DATE OF BIRTH:  1951/09/27   DATE OF CONSULTATION:  01/20/2008  DATE OF DISCHARGE:                                 CONSULTATION   PRIMARY CARE PHYSICIAN:  Sigmund Hazel, M.D.   REQUESTING PHYSICIAN:  Bethann Berkshire, M.D.   REASON FOR CONSULTATION:  Nausea, vomiting, abdominal pain with history  of bariatric procedure, question surgical etiology.   HISTORY OF PRESENT ILLNESS:  Ms. Sesma is a 60 year old female who had  a bariatric procedure done in 2002 by Dr. Marcos Eke at [place] in [place].  She tells me with a duodenal switch which usually involves a  sleeve gastrectomy.  She had an open cholecystectomy done at the same  time.  She has had no abdominal surgeries since that time.  Normally she  has about 4-6 bowel movements a day and often eats large volumes of  food.  She occasionally will get bloating and diarrhea after she eats,  and she confesses that she is poorly compliant with diet and taking of  her medications.  She has complicated health history including diabetes,  hypocalcemia, anemia, history of kidney stones, chronic back pain with  herniated disks. She is on chronic anticoagulation.   She notes since 2 weeks ago she has been having intermittent episodes of  nausea, vomiting and worsening diarrhea.  She says her son was sick  similar is about 2 weeks ago, but he got better.  She has persisted.  She feels like she is getting dehydrated.  No hematochezia or melena.  No hematemesis.  She is usually just bringing up small volumes of clear  liquids and not really any solid food.  She has never really had  anything like this before.  Based on concerns, she was brought in for  evaluation.   PAST MEDICAL HISTORY:  1. Coronary disease status post  cardiac bypass surgery back in 2000.  2. Chronic anemia from her bariatric surgery, being evaluated Dr.      Dalene Carrow with poor compliance and intake of iron.  3. Diabetes, used to be on 4 medications, currently just on oral      hypoglycemic.  4. History of left renal calculi status post nephroscopy and removal      of a large left renal stone back in a July 2006.  5. She had a stroke after that, and I think she has been on chronic      anticoagulation for that.  6. Some question of cardiac dysrhythmia or chronic atrial      fibrillation, and she was ablated prior to her cardiac surgery back      in the late 1990s. No episodes since that she can recall.  7. Obesity, used to be fairly morbid but improved after 180-pound      weight loss.  8. Depression  9. Peripheral neuropathy.  10.Diabetic retinopathy.  11.Right retinal hemorrhage  and vitreous hemorrhage in the right eye      in the past.   PAST SURGICAL HISTORY:  1. She had three-vessel cardiac bypass.  2. She has had repair for sternal debridement and rewiring in 2002 by      Dr. Marcos Eke in [place] at [place].  3. She had some type of bariatric procedure which she tells me was a      duodenal switch.  She had a cholecystectomy done at that time.      Other reports talk about a gastric bypass, but it looks like she      had a sleeve gastrectomy by CT scan  4. Hysterectomy in the past.  5. Question carotid endarterectomy in the distant past.  6. Questionable ventral hernia repair and appendectomy in 2003,      although I do not see where that was done.  It was not done in the      [employer]   ALLERGIES:  She tells me she is allergic to PENICILLIN, REGLAN and  NEURONTIN.   MEDICATIONS:  Include Cymbalta, lovastatin, morphine, iron, Glucophage,  Citrucel, Prilosec and Coumadin.  She does not know why she still on  Coumadin.   SOCIAL HISTORY:  I guess she used to work for [employer] .  I  do not  know if she is actively employed at this time.  She confesses she  is poorly compliant. No tobacco or drug use.  She is here today with her  son.   FAMILY HISTORY:  Not contributory for any major GI issues except for  recent gastroenteritis per her son.   REVIEW OF SYSTEMS:  CONSTITUTIONAL:  Negative for any fever, chills or  sweats but positive for fatigue.  RESPIRATORY:  She has had a little bit  of a cough but no shortness of breath or wheezing.  CARDIAC:  Negative  for any exertional chest pain or angina.  GI:  As noted above.  No  melena.  No abdominal pain, no hematochezia or melena.  No abdominal  cramps.  No hematemesis or rectal bleeding.  GU:  She denies any  frequency, urgency, dysuria, pyuria, hematuria.  NEUROLOGICAL:  Occasional headache but no dizziness.  No mental status changes or  confusion or aphasia or weakness or lightheadedness.  METABOLIC:  She  says her sugars have been much rather controlled with the oral  medication.  No polyuria, some mild thirst, and she has been feeling  dehydrated and nauseated.  HEPATIC AND ENDOCRINE:  Negative.  PSYCHIATRIC:  No recent bouts of depression.  DERMATOLOGIC:  Negative.  MUSCULOSKELETAL:  Chronic back pain, unchanged.  BREASTS:  Negative.  GYN:  Negative.   PHYSICAL EXAMINATION:  VITAL SIGNS:  Her temperature is 91.  Her pulse  has been 88-100, respirations 18. Blood pressure has been in the  120s/70s.  GENERAL:  She is a well-developed, pale female, fairly groomed, tired  but not frankly toxic.  PSYCH:  She is interactive with at least average intelligence. Seems to  have relatively good insight and no evidence of dementia, delirium,  psychosis, paranoia.  EYES:  Pupils equal, round, and reactive to light.  Extraocular  movements intact.  Sclerae nonicteric or injected.  NEUROLOGICAL:  Cranial nerves II-XII are intact.  Strength 5/5 equal and  symmetrical.  No resting or intention tremors.  HEENT:  She is normocephalic  with no facial asymmetry.  Mucous membranes  are dry.  Dentition fair.  Nasopharynx and oropharynx  are clear.  NECK:  Supple, no mass.  Trachea is midline.  HEART:  Regular rate and rhythm.  No murmurs, heaves or rubs.  CHEST:  Clear to auscultation bilaterally.  No wheezes, rales, or  rhonchi.  No pain on regular sternal compression.  ABDOMEN:  Completely soft, nontender, nondistended.  She has a few  midline incisions and laparoscopic incisions.  She has no abdominal pain  anywhere.  No Murphy's sign.  No tenderness over McBurney's point and no  incisional hernia.  No umbilical hernia.  GENITOURINARY:  No inguinal hernias.  Normal external female genitalia.  RECTAL:  Deferred per patient request.  EXTREMITIES:  No clubbing, cyanosis, no edema.  MUSCULOSKELETAL:  Full range motion shoulders, elbows, wrists, ankles.  BREASTS:  Deferred.  SKIN:  No obvious petechia or purpura. No sores or lesions.   LABORATORY VALUES:  Her urinalysis was strongly positive with leukocyte  esterase, nitrates and white blood cells.  Her white count was 6.1.  She  does not have a left shift.  Her chemistries are normal range.   She has a CT scan of the abdomen which I reviewed with radiology.  Radiologist concerned with some fecalization of the small bowel and some  decompressed small bowel.  He could not see a definite transition wound.  There are mildly dilated small-bowel loops and colon as well.  To me, it  looks more like an ileus, more consistent with a gastroenteritis.  The  radiologist shows concern for possible partial small-bowel obstruction.  I do not see any definite transition point so is not massively dilated.  She does not have any evidence hydronephrosis or significant kidney  stones to my evaluation.   ASSESSMENT AND PLAN:  A 60 year old woman with prior bariatric surgery,  probable duodenal switch, with most of her intestine bypassed with  nausea, vomiting, diarrhea with a history of  recent contact with a son  with gastritis as well.  1. I do not think this is a surgical abdomen by exam nor by CT scan.  2. I strongly suspect gastroenteritis with some ileus given the      dilated small-bowel and colon loops.  There is no definite      transition point.  I am skeptical of a partial small-bowel      obstruction.  I would recommend hydration and follow.  3. Urinary tract infection.  I would recommend antibiotic treatment      for that.  There is no evidence of a pyelonephritis and would not      use anything stronger than oral antibiotics at this point but will      defer to  ER and medicine for that.  4. If she is admitted, I would be willing to follow with you, but I      strongly doubt that she needs any surgical intervention at this      time and just needs good hydration and followup.  5. Severe anemia.  Agree with Dr. Hyacinth Meeker that she needs to see Dr.      Dalene Carrow, and I strongly recommended that she take her iron more      regularly.  I wonder if she needs some IV iron supplement this.  6. Hypocalcemia secondary to poor oral intake as well.  7. Chronic anticoagulation.  I defer to medicine whether or not that      is still needed.  8. It would be good to get a copy of her prior operative reports  to      see what exactly was done in 2002 at [place].  It is hard to tell      exactly what was done until we get that operative report.   Overall, she has no evidence of a surgical abdomen or anything needing  intervention at this time.   Thank you.      Ardeth Sportsman, MD  Electronically Signed     SCG/MEDQ  D:  01/20/2008  T:  01/21/2008  Job:  [id number]

## 2011-05-01 NOTE — H&P (Signed)
NAMECAYLAH, PLOUFF              ACCOUNT NO.:  [id number]   MEDICAL RECORD NO.:  [id number]          PATIENT TYPE:  OBV   LOCATION:  2036                         FACILITY:  [facility]   PHYSICIAN:  Colleen Can. Sharron Chalk, M.D.DATE OF BIRTH:  12-08-1951   DATE OF ADMISSION:  03/29/2009  DATE OF DISCHARGE:                              HISTORY & PHYSICAL   CHIEF COMPLAINT:  Chest pain.   HISTORY OF PRESENT ILLNESS:  Mrs. [name] is a 60 year old white female,  who is referred for admission for chest pain.  She has a known history  of ischemic heart disease with remote bypass surgery.  She has had  significant weight loss following gastric bypass surgery.  She does have  known diabetes since the age of 50.  Blood sugars have been better since  she has had weight loss.  She was seen in the office on March 23, 2009,  for her 6 months visit.  At that time, she was doing reasonably well,  but reportedly had 2 episodes of substernal chest pain which was very  characteristic of her angina.  They occurred while she was sitting at  the computer.  She was certainly nonexertional.  They were very short  lived.  At that time, she was referred for adenosine Cardiolite study.  Today, however, she had an episode again that occurred while sitting at  the computer.  She noted that it felt as if an elephant was standing on  her chest.  She rated it 10/10 on a scale of 0-10.  She had not filled  her nitroglycerin, so she drove herself to the [facility] where the  pharmacist gave her one nitroglycerin with immediate relief.  She had  associated shortness of breath, felt clammy, and nauseated.  She had no  actual vomiting.  She had some involvement down to the left hand.  The  whole episode lasted about 15 minutes and had subsequently left her  feeling just exhausted.  She is now referred for admission with plans  for cardiac catheterization.   PAST MEDICAL HISTORY:  1. Known ischemic heart disease with previous  coronary artery bypass      grafting per Dr. Charlett Lango in June 2000.  At that time,      she had left internal mammary to the LAD, saphenous vein graft to      the first diagonal, and saphenous vein graft to the distal right      coronary.  Her last Cardiolite study was in April 2009 which showed      the ejection fraction to be normal.  There may have been some      septal hypokinesia, but it was not felt to be significant.  Her      ejection fraction was 68%.  2. History of headaches.  3. Chronic anemia.  4. History of UTIs.  5. Type 2 diabetes.  6. Peripheral neuropathy.  7. Retinopathy.  8. History of cerebrovascular accident.  9. Hypocalcemia.  10.History of morbid obesity, status post gastric bypass surgery.  11.History of a left renal calculi with removal of large left  renal      stone in July 2006.  12.History of depression.  13.Ventral hernia repair.  14.History of appendectomy in 2003.  15.History of LVH.  16.History of SVT, status post ablation in July 1997.  17.Pseudotumor cerebri followed by Dr. Sharene Skeans.  18.History of asthma.  19.Childbirth x2.  20.Vaginal hysterectomy.   ALLERGIES:  PENICILLIN, REGLAN, and NEURONTIN.   CURRENT MEDICATIONS:  1. Metformin 500 mg a day.  2. MSIR 15 mg q.6 h. p.r.n.  3. Extra Strength Tylenol 2 tablets q.6 h. p.r.n.  4. Cymbalta 60 mg a day.  5. Calcitriol 0.5 b.i.d.  6. Sublingual B12 1000 mcg b.i.d.  7. Baby aspirin daily.   FAMILY HISTORY:  Positive for diabetes, myocardial infarction,  hypertension, and asthma.   SOCIAL HISTORY:  She is currently disabled.  She stopped smoking in  1989.  She has no alcohol use.  She is divorced.  Her son escorted her  to the office today.   REVIEW OF SYSTEMS:  Her chest pain is as described above.  She does not  really exert herself and does not adhere to any type of exercise  program.  She has had no recent fever, flu, or cough.  No signs of a  stroke.  Her appetite is  unremarkable.  Typically she is not short of  breath.  She denies having constipation.  She has had significant weight  loss following her gastric bypass surgery.  She has really noted no  complaints of lower extremity edema.  All other review of systems are  negative.   PHYSICAL EXAMINATION:  GENERAL:  She is somewhat anxious.  She is very  tearful.  She looks exhausted.  She is currently pain free.  VITAL SIGNS:  Her weight was 168 pounds, blood pressure 140/68 sitting,  140/66 standing, and heart rate 76 and regular. Respirations are 18 and  regular.  HEENT:  Normocephalic and atraumatic.  Pupils are equal and reactive to  light.  Conjunctivae were normal.  NECK:  Supple.  No masses.  No JVD.  LUNGS:  Clear to auscultate.  She is not short of breath at rest.  CARDIAC:  Regular rhythm.  There is no murmur.  ABDOMEN:  Multiple scars, but is soft, positive bowel sounds, and  nontender.  EXTREMITIES:  Full but really no significant edema.  MUSCULOSKELETAL:  Strength is symmetric.  She has full range of motion.  Gait is intact.  NEUROLOGIC:  She is intact with no gross focal deficits.   Pertinent labs are pending.   Her EKG shows sinus rhythm with poor R-wave progression with no acute  changes.   OVERALL IMPRESSION:  1. Prolonged episode of chest pain identical to her previous chest      pain syndrome.  2. Remote coronary artery bypass grafting x3 in 2000 per Dr. Charlett Lango.  3. History of morbid obesity, status post gastric bypass surgery with      significant weight loss.  4. Diabetes.  5. History of atrioventricular nodal ablation because of      supraventricular tachycardia.   PLAN:  We will proceed on admission to the hospital.  Home medicines  will be continued except for her metformin.  We will place her on IV  nitroglycerin and IV heparin.  We have made arrangements for Dr. Swaz[place]and  to perform cardiac catheterization tomorrow morning, that procedure has   been reviewed in full detail and she is willing to proceed.  Sharlee Blew, N.P.      Colleen Can. Batya Chalk, M.D.  Electronically Signed    LC/MEDQ  D:  03/29/2009  T:  03/30/2009  Job:  [id number]

## 2011-05-01 NOTE — Discharge Summary (Signed)
NAMECARMELL, ELGIN NO.:  [id number]   MEDICAL RECORD NO.:  [id number]          PATIENT TYPE:  INP   LOCATION:  2036                         FACILITY:  [facility]   PHYSICIAN:  Peter M. Swaziland, M.D.  DATE OF BIRTH:  [birth year]   DATE OF ADMISSION:  03/29/2009  DATE OF DISCHARGE:  03/31/2009                               DISCHARGE SUMMARY   HISTORY OF PRESENT ILLNESS:  Ms. Dufrane is a 60 year old white female  with complex medical history who presented with symptoms of increasing  chest pain consistent with angina.  She had had prior coronary artery  bypass surgery 10 years ago and was admitted for further evaluation.  For details of her past medical history, social history, family history  and physical exam, please see admission history and physical.   LABORATORY DATA:  The patient's ECG showed low voltage but no acute  change.  Her chest x-ray showed no active disease.  Initial hemoglobin  was 12.8,  and hematocrit 37.6.  Following morning, her hemoglobin was  10.1, hematocrit 29.5 which was consistent with her chronic anemia.  White count was 5500, platelets 141,000.  Sodium 139, potassium 3.8,  chloride 110, CO2 26, BUN 12, creatinine 0.7, glucose of 136, PTT was  31, PT was 18.8 with an INR of 1.5.  Liver function studies showed  alkaline phosphatase at 93, bili and transaminases were normal.  Albumin  was 3.0.  Serial cardiac enzymes were negative x3.   HOSPITAL COURSE:  The patient was admitted to telemetry monitor.  She  was initially placed on IV nitroglycerin and heparin.  She ruled out for  myocardial infarction.  She had no subsequent chest pain during her  hospital stay.  On March 30, 2009 she underwent cardiac catheterization.  This demonstrated that all her grafts were patent including saphenous  vein graft to the first diagonal, saphenous vein graft to the distal  right coronary artery and an LIMA graft to the LAD.  The LIMA graft was  very small in  caliber and atretic distally but there was no significant  obstructive disease in the LAD proper and excellent competitive flow  down the native LAD so this was not felt to be a significant issue.  She  did have moderate disease in two very small marginal branches that were  felt not to be amenable to intervention.  Left ventricular function was  normal.  Based on these findings, we recommended continued medical  therapy and we anticipated discharging the patient home that same day  but subsequently after cardiac catheterization, she developed nausea,  vomiting and developed a moderate sized right groin hematoma.  She was  treated with IV Protonix and p.r.n. Phenergan.  By the following  morning, her nausea and vomiting had resolved.  Her groin hematoma was  stable in fact it was small and soft.  There was mild tenderness but no  bruit.  Pedal pulses were normal.  At this point, we recommended  addition of proton pump inhibitor to her prior medications and  discharged her home to follow up with Dr. Marita Chalk in 1  week.   DISCHARGE DIAGNOSES:  1. Chest pain, noncardiac.  2. History of coronary artery disease status post coronary artery      bypass surgery.  3. Chronic anemia.  4. History of urinary tract infection.  5. Type 2 diabetes mellitus.  6. Status post gastric bypass surgery.  7. History of supraventricular tachycardia.   DISCHARGE MEDICATIONS:  1. Aspirin 81 mg per day.  2. Morphine MSIR 15 mg p.r.n.  3. Metformin 500 mg daily to be resumed on April 01, 2009.  4. Cymbalta 60 mg per day.  5. Calcitriol 0.5 mg twice a day.  6. B12 1000 mcg twice a day.  7. Tums as needed.  8. Nitroglycerin sublingual as needed.  9. Protonix 40 mg per day.   DISCHARGE STATUS:  Improved.  She is to follow up with Dr. Darene Chalk in  approximately 1 week.           ______________________________  Peter M. Swaziland, M.D.     PMJ/MEDQ  D:  03/31/2009  T:  04/01/2009  Job:  [id number]   cc:    Colleen Can. Cova Chalk, M.D.

## 2011-05-01 NOTE — Discharge Summary (Signed)
[name], [name] NO.:  [id number]   MEDICAL RECORD NO.:  [id number]          PATIENT TYPE:  INP   LOCATION:  1514                         FACILITY:  [facility]   PHYSICIAN:  Ramiro Harvest, MD    DATE OF BIRTH:  1951/01/14   DATE OF ADMISSION:  01/20/2008  DATE OF DISCHARGE:  01/22/2008                               DISCHARGE SUMMARY   PRIMARY CARE PHYSICIAN:  Dr. Hyacinth Meeker of [facility].   DISCHARGE DIAGNOSES:  1. Ileus.  2. Viral gastroenteritis.  3. Probable urinary tract infection.  4. 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  5. Anemia, chronic on iron replacement.  6. Coronary artery disease, status post coronary artery bypass graft.  7. History of cerebrovascular accident.  8. Hypocalcemia.  9. Morbid obesity, status post gastric bypass surgery.  10.History of left renal calculi, status post removal of large left      renal stone July 2006. 11.  Question of cardiac dysrhythmia.  11.Depression.  12.History of right retinal hemorrhage and vitreous hemorrhage of the      right eye.  13.Ventral hernia repair and appendectomy in 2003.   DISCHARGE MEDICATIONS:  1. Bactrim DS 1 tablet p.o. b.i.d. x 1 day.  2. Morphine 15 mg p.o. b.i.d.  3. Coumadin 4 mg q.h.s.  4. Cymbalta 60 mg p.o. daily  5. Lovastatin 40 mg p.o. daily.  6. Citrucel fiber laxative.  7. Glucophage 1000 mg p.o. b.i.d.  8. Extra Strength Tylenol b.i.d.  9. Slo-Iron tablets 1 tablet b.i.d.  10.Calcitriol 2 tablets b.i.d.  11.Aspirin 81 mg daily.  12.Calcium citrate 2 tablets p.o. b.i.d.   DISPOSITION/FOLLOW UP:  The patient will be discharged home with follow  up with her PCP in 2 weeks.  Follow up CBC will need to be checked to  follow up on the patient's hemoglobin.  Basic metabolic profile will  also need to be checked to follow up on the patient's electrolytes.  The  patient to keep her scheduled appointment with her PCP for her INR  check.   CONSULTATIONS:  A general surgery consult was done on January 20, 2008.  The patient was seen in consultation per Dr. Estelle Grumbles.   PROCEDURES:  1. Chest x-ray was done on January 20, 2008 that showed post-CABG and      post-coronary artery stent placement, no acute findings.  2. CT of the abdomen and pelvis were done January 20, 2008 which      showed probable ileus with small bowel.  Also a large amount of      fecal material throughout the colon.  No complications of gastric      bypass evident by CT findings likely reflective of ileus.  No acute      inflammatory process identified.  Dilated small bowel loops in the      pelvis.   HISTORY/PHYSICAL:  Ms. Malkiewicz is a 60 year old white female with history  of type 2 diabetes with retinopathy and neuropathy, history of  hypocalcemia, morbid obesity, status post gastric bypass surgery,  coronary artery disease, status post three-vessel CABG, hyperlipidemia,  CVA,  anemia and a question of a cardiac dysrhythmia, who presented with  complaints of nausea and vomiting for the past 2 weeks prior to  admission.  The patient reported that she was feeling nauseated as well  as having vomited for the prior 2 weeks to admission.  After more  questioning, she told the admitting physician that essentially she was  dry heaving and not producing much vomitus.  She also reported that at  baseline, she had some nausea and vomiting because of the gastric bypass  surgery as well as possible diabetic gastropathy.  She also reported  loose stools which were not watery for the past 4 days prior to  admission.  The patient also reported a fever of 103.8 four days prior  to admission.  The patient denied any sick contacts.  The patient denied  eating any food at an outside place.  The patient did report some  congestion in the chest, otherwise denied any chest pain, no abdominal  pain.  The patient denied any abdominal distention, but reported passing  gas  and having some loose stools.  Further denied any symptoms of  urinary tract infection such as frequency, urgency or dysuria.   PHYSICAL EXAMINATION:  VITAL SIGNS:  Temperature 99.1, pulse of 88,  respirations 20, blood pressure 123/75.  GENERAL:  The patient was pale,  otherwise in no acute distress.  HEENT:  Normocephalic, atraumatic.  Pupils are equal, round and reactive  to light and accommodation.  Extraocular movements intact.  NECK:  Supple.  No lymphadenopathy.  RESPIRATORY:  Lungs were clear to auscultation bilaterally.  CARDIOVASCULAR:  Regular rate and rhythm.  No murmurs, rubs or gallops.  ABDOMEN:  Soft, nontender, nondistended.  Hyperactive bowel sounds.  SKIN:  Pale.   LABORATORY DATA:  Sodium 139, potassium 4.2, BUN 13, creatinine 0.7,  white count 6.1, hemoglobin 11.3, hematocrit 34.5, platelets 208.  Urinalysis microscopy showed a WBC of 21-50, had some mild bacteria and  nitrite positive, moderate leukocyte esterase, INR was 1.2.  CBGs ranged from 96-146.  Discharge labs:  Basic metabolic profile:  Sodium 133, potassium 4.0, chloride 102, bicarb 25, glucose 75, BUN 5,  creatinine 0.70, calcium 7.6, albumin 2.5, PT 17.4, INR 1.4.  CBC:  White count 5.4, hemoglobin 10.5, hematocrit 31.5, platelet count 179.   DIAGNOSTICS:  CT of the abdomen and pelvis with results as stated above.   HOSPITAL COURSE:  1. Ileus.  It was noted that the patient had an ileus per CT and      surgery was consulted on the patient.  The patient had stated she      was having some nausea and vomiting, but felt like this was more      dry heaving.  CT did not show any bowel obstruction.  The patient      was seen in consultation by surgery.  The patient was then placed      on clears for her diet initially.  The next day, the patient had      tolerated her clears well, had no more episodes of nausea and      vomiting, was passing gas and also had a well-formed bowel movement      as well.  The  patient was not complaining of abdominal pain.  The      patient's diet was then advanced as tolerated.  It was felt there      were no surgical issues at the time and  surgery signed off.  The      patient tolerated her diet well and was doing okay, had no more      episodes of nausea and vomiting, and was having normal bowel      movements.  The patient was also hydrated.  During the rest of the      hospitalization, the patient was in stable and improved condition.      The patient will be discharged home in stable and improved      condition to follow up with her PCP.  2. Viral gastroenteritis.  The patient did not have any episodes of      diarrhea throughout the hospitalization.  The patient did have well-      formed bowel movements.  The patient remained also afebrile and had      a normal white count.  It was felt that the patient might have had      a viral gastroenteritis which had resolved by the time of      hospitalization.  The patient will be discharged in stable and      improved condition.  3. Probable urinary tract infection.  Urinalysis has shown the      presence of nitrite positive moderate leukocyte esterase.  Urine      cultures were obtained and showed greater than 100,000 of E-coli.      The patient was placed on Bactrim DS for a 3 day course.  The      patient was asymptomatic.  Sensitivities were pending at the time      of discharge.  This can be followed up per PCP.  The patient will      be given 1 more day of Bactrim DS 1 tablet twice a day  to complete      a 3 day course for uncomplicated urinary tract infection.  4. The rest of the patient's chronic medical issues were stable      throughout the hospitalization.  The patient was maintained on her      home medications throughout the hospitalization.  Follow up with      her PCP as scheduled 2 weeks.   On day of discharge vital signs:  Temperature 98.8, pulse of 93,  respirations 20, blood pressure  135/81, satting 94% on room air.   On day of discharge, the patient was in improved and stable condition.  The patient will be discharged home to follow up with PCP in 2 weeks.  Also to follow up with PCP as scheduled for her INR check.  The patient  will be discharged back home on her home dose of Coumadin 4 mg daily.  It has been a pleasure taking care of Ms. Neta Mends.      Ramiro Harvest, MD  Electronically Signed     DT/MEDQ  D:  01/22/2008  T:  01/23/2008  Job:  [id number]

## 2011-05-01 NOTE — Op Note (Signed)
NAMEJESSALYN, [name]              ACCOUNT NO.:  [id number]   MEDICAL RECORD NO.:  [id number]          PATIENT TYPE:  AMB   LOCATION:  SDS                          FACILITY:  [facility]   PHYSICIAN:  Charles E. Fields, MD  DATE OF BIRTH:  [birth year]   DATE OF PROCEDURE:  08/12/2009  DATE OF DISCHARGE:  08/12/2009                               OPERATIVE REPORT   PROCEDURES:  1. Arch aortogram.  2. Selective right and left carotid angiogram.   PREOPERATIVE DIAGNOSIS:  High-grade stenosis, left carotid artery.   POSTOPERATIVE DIAGNOSIS:  Bilateral internal carotid artery occlusion.   INDICATIONS:  The patient previously had a carotid duplex exam, which  showed 70% stenosis of the left internal carotid artery.  She has  symptoms of dizziness.  She previously had an arteriogram in 2002, which  suggested carotid artery occlusion.  She presents today for carotid  angiogram for clarification between the discrepancy of the ultrasound  and the carotid angiogram.   OPERATIVE DETAILS:  After obtaining informed consent, the patient was  taken to the PV lab.  The patient was placed in supine position on the  angio table.  Right groin was prepped and draped in usual sterile  fashion.  Local anesthesia was infiltrated over the right common femoral  artery.  An introducer needle was used to cannulate the right common  femoral artery and a 0.035 Versacore wire threaded through the right  femoral artery and up to the abdominal aorta under fluoroscopic  guidance.  Next, a 5-French sheath was placed over the guidewire in the  right common femoral artery and this was thoroughly flushed with  heparinized saline.  A 5-French pigtail catheter was then placed over  the guidewire and these were advanced as a unit up into the aortic arch.  An arch aortogram was obtained.  This shows aberrant great vessel  location with an aberrant left subclavian artery coming off the arch  just distal to the left subclavian  artery.  The right common carotid  artery and left common carotid artery have a common trunk, which is the  first vessel off aortic arch.  The right vertebral artery comes off of  the right common carotid artery.  The left vertebral artery comes off  normal position off the left subclavian artery.  The left and right  vertebral arteries are quite large, almost the same diameter as the  common carotid arteries.  They are widely patent.  The left and right  subclavian arteries are patent.  The left and right common carotid  arteries are patent.   Next, a 45-degree Berenstein catheter was brought up in the operative  field and this was placed over the Versacore wire after removing the  pigtail catheter over the [employer] wire.  The Berenstein catheter was  used to selectively catheterize the right common carotid artery and a  selective injection of this was performed.  Intracranial views were also  performed in the AP and lateral projection to be interpreted later by  the radiologist.  The lateral view of the common carotid injection shows  that the right  internal carotid artery is occluded just after its  origin.  The external carotid artery is patent.  There was some reflux  of contrast back into the vertebral system and this fills normally up to  the skull base.   Next, the Berenstein catheter was pulled back over a guidewire down in  the aortic arch.  Several attempts were made to get this to engage the  left common carotid artery, but these were unsuccessful.  The catheter  was flushed thoroughly with heparinized saline and aspirated prior to  this.  This was then removed over a guidewire.  A 90-degree Berenstein  catheter was then brought up in the operative field and placed over the  guidewire up in the aortic arch and then I was able to successfully use  as to catheterize the left common carotid artery.  A selective injection  of the left common carotid artery was then performed in  an AP and  lateral projection also with intracranial views to be interpreted by the  neuroradiologist.  This also shows that the left internal carotid artery  is occluded just after its origin.  The left external carotid artery is  widely patent.  At this point, the catheter was removed over a  guidewire.  The 5-French sheath was left in place to be pulled in the  holding area.  The patient tolerated the procedure well and there were  no complications.  The patient was taken to the holding area in stable  condition.   OPERATIVE FINDINGS:  1. Bilateral internal carotid artery occlusions.  2. Aberrant left subclavian artery.  3. No significant great vessel stenosis.  4. Patent vertebral arteries, which are quite large and compensating      for bilateral internal carotid artery occlusions.      Janetta Hora. Fields, MD  Electronically Signed     CEF/MEDQ  D:  08/13/2009  T:  08/13/2009  Job:  [id number]

## 2011-05-01 NOTE — H&P (Signed)
[name], TURNER              ACCOUNT NO.:  [id number]   MEDICAL RECORD NO.:  [id number]          PATIENT TYPE:  INP   LOCATION:  1514                         FACILITY:  [facility]   PHYSICIAN:  Sabino Donovan, MD        DATE OF BIRTH:  06/17/1951   DATE OF ADMISSION:  01/20/2008  DATE OF DISCHARGE:                              HISTORY & PHYSICAL   PRIMARY CARE PHYSICIAN:  Dr. Kinnie Feil hospitalist.   CHIEF COMPLAINT:  Nausea, vomiting and diarrhea.   HISTORY OF PRESENT ILLNESS:  The patient is a 60 year old white female  with a history of diabetes, hypoglycemia, morbid obesity obese, status  post gastric bypass surgery, coronary artery disease, status post three-  vessel coronary artery bypass graft surgery, hyperlipidemia,  cerebrovascular accident and anemia and questionable atrial  fibrillation, who presented with the complaint of nausea and vomiting  for the last two weeks.  She reports that she has been feeling nauseated  as well as having vomiting for the last two weeks.  After more  questioning, she tells me that essentially she is having dry heaving and  not producing as much vomitus.  She reports that at baseline she has  some nausea and vomiting, because of her gastric bypass surgery as well  as some diabetic gastropathy.  She also reports loose stools which are  not watery, for the last four days.  She reports a fever of 103.8  degrees four days prior to presentation.  She denies any sick contacts.  Denies eating any food at an outside place.  She does report some  congestion in her chest, otherwise denies any chest pain.  No abdominal  pain.  She denies any abdominal distention, but does report passing gas  and she is having loose stools, as mentioned earlier.  Further, she  denies any symptoms of urinary tract infections such as frequency or  urgency.   PAST MEDICAL HISTORY:  1. History of morbid obesity, status post gastric bypass surgery in      2002.  Had lost  about 180 pounds since then.  2. Anemia.  Has been on iron replacement.  3. Diabetes.  Previously was on insulin, currently taking Glucophage.  4. Hypoglycemia.  Has been on iron replacement therapy.  5. Coronary artery disease, status post three-vessel bypass graft that      was done in 1993.  6. History of CVA.  She reports having a mild CVA in 1993, and has      been on Coumadin.  7. Questionable atrial fibrillation.  She tells me that she has some      sort of irregular heart rhythm, for which she underwent a critical      ablation by Dr. Colleen Can. Tennant at [place], in 1993.   FAMILY HISTORY:  Unremarkable.   SOCIAL HISTORY:  She quit smoking 38 years ago.  No alcohol, no IV drug  use.   ALLERGIES:  PENICILLIN which gives her anaphylactic shock.  She is  allergic to [facility].  It makes her vomit.  NEURONTIN makes her nervous.   CURRENT MEDICATIONS:  1. Cymbalta 60 mg once daily.  2. Lovastatin 40 mg once daily.  3. Morphine 50 mg p.o. twice daily.  4. Iron.  5. Glucophage 1000 mg p.o. twice daily.  6. Citrucel.  7. Calcitriol.  8. Coumadin 4 mg p.o. q.h.s.   REVIEW OF SYSTEMS:  Otherwise unremarkable.   PHYSICAL EXAMINATION:  VITAL SIGNS:  Temperature 99.1 degrees, pulse 88,  respirations 20, blood pressure 123/75.  GENERAL:  She is pale, otherwise in no acute distress.  HEENT:  Pupils equal, round, reactive to light and accommodation.  EOMI.  NECK:  No lymphadenopathy, no thyromegaly.  CHEST:  Clear to auscultation bilaterally.  HEART:  A regular rate and rhythm.  No murmurs, rubs or gallops.  ABDOMEN:  Soft, nontender, non-distended.  She had hyperactive bowel  sounds.  SKIN:  Pale.   LABORATORY DATA:  Sodium 139, potassium 4.2, BUN 13, creatinine 0.7.  White count 6.1, H&H 11.3 and 34.5 respectively, platelets 208.  Urinalysis showed white blood cell count of 21-50, bacteria and nitrite  positive, leukocyte esterase moderate.  INR 1.2, subtherapeutic.   A CT of  the abdomen and pelvis showed dilated loops of bowel which is  more consistent with ileus and not small bowel obstruction.  She also  has a large amount of feces in the colon.  There was no complication of  gastric bypass anatomy that was evident by CT scan.   IMPRESSION:  The patient is a 60 year old with multiple medical  problems, who presented with two weeks of nausea and vomiting.   PROBLEM:  1. Nausea and vomiting:  It could be multifactorial.  It could be due      to a urinary tract infection.  Her urinalysis is consistent with a      urinary tract infection although she did not have any symptoms of      it.  She does not have any signs of hydronephritis by CT scan.      Other possibility could be a gastroenteritis.  She had a      temperature along with nausea, vomiting and diarrhea.  The patient      is a poor historian, so it is kind of difficult to say whether she      is just dry heaving or having true nausea and vomiting.  Other      thing could be a diabetic gastropathy.  She does report that she      eats very fast.  An ileus could be a reason, although she has no      signs of a small bowel obstruction.  We will do the following:  We      will give the patient Bactrim for a urinary tract infection and      give IV fluids, as the patient seems dehydrated.  I will send a      urine culture as well as a white blood cell count, stool Gram's      stain and culture.  2. Ileus:  The patient by the examination has a very soft abdomen and      very active bowel sounds and she is hungry, passing gas.  Could be      due to morphine as well.  Will hold the morphine for now.  Will      only give this for breakthrough pain.  Give her Lortab instead.      Will give her IV fluids.  The patient is requesting food and I  do      not think there is any reason to keep her n.p.o..  Will start with      clear liquid diet for overnight.  If she continues to do well, we      may be able to  advance her diet.  Apparently she has a large amount      of diarrhea.  We will follow overnight.  If this continues, a      laxative could be considered.  3. Diabetes:  The patient may require being n.p.o. as well.  Her food      intake is not optimal.  Will hold Glucophage for now.  Get a CBC      and cover with sliding scale insulin.  4. Coronary artery disease:  Will start the patient on aspirin.      Continue Lovastatin.  She was noted not to be on beta blockers and      ACE inhibitor.  This may be considered during the course of her      hospitalization.  5. Cerebrovascular accident:  She was apparently on Coumadin for a      CVA.  It was being held because she was to have a colonoscopy.  As      there was a concern for ileus, I will continue to hold the Coumadin      for now and follow her.  This should be restarted prior to      discharge.  6. Questionable atrial fibrillation:  I am uncertain whether this is a      real diagnosis, although she does tell me that this irregular      rhythm for which she underwent ablation.  I am unsure whether this      is the reason she is on Coumadin as well.  In any case, we will      hold Coumadin for now.  Will restart the Coumadin when the patient      is feeling a bit better.      Sabino Donovan, MD  Electronically Signed     MJ/MEDQ  D:  01/20/2008  T:  01/21/2008  Job:  [id number]

## 2011-05-01 NOTE — Cardiovascular Report (Signed)
NAMEJANIKA, JEDLICKA NO.:  [id number]   MEDICAL RECORD NO.:  [id number]          PATIENT TYPE:  INP   LOCATION:  2036                         FACILITY:  [facility]   PHYSICIAN:  Peter M. Swaziland, M.D.  DATE OF BIRTH:  Apr 11, 1951   DATE OF PROCEDURE:  03/30/2009  DATE OF DISCHARGE:                            CARDIAC CATHETERIZATION   INDICATIONS FOR PROCEDURE:  A 60 year old white female with known  history of coronary disease status post CABG 10 years ago.  She presents  with recent episodic chest pain consistent with angina pectoris.   PROCEDURES:  Left heart catheterization, coronary and left ventricular  angiography, saphenous vein graft angiography x2, left internal mammary  artery graft angiography.   EQUIPMENT:  A 6-French 4-cm right and left Judkins catheter, 6-French  pigtail catheter, 6-French arterial sheath, and 6-French left internal  mammary artery catheter.   MEDICATIONS:  Local anesthesia with 1% Xylocaine, Versed 3 mg IV,  fentanyl 25 mcg IV.   CONTRAST:  140 mL of Omnipaque.   HEMODYNAMIC DATA:  Aortic pressure was 100/54 with a mean of 74 mmHg,  left ventricular pressure was 102 with EDP of 10 mmHg.   ANGIOGRAPHIC DATA:  Left coronary artery arises and distributes  normally.  It is a codominant vessel.  Left main coronary is normal.   Left anterior descending artery demonstrates a stented segment in the  proximal vessel covering the first diagonal branch.  There is diffuse 30-  40% disease within the stent.  There is a focal 40-50% stenosis  following the stented segment.  There is excellent antegrade flow down  the LAD.  The diagonal branch fills by competitive flow from the graft.   The left circumflex coronary is a codominant vessel.  It gives rise to  two small marginal vessels proximally.  It then terminates in two larger  posterolateral branches.  The left circumflex proper is without  significant disease.  The first obtuse marginal  vessel is very small in  caliber and has an 80% stenosis proximally.   The second obtuse marginal vessels also small in caliber and has  approximately 70% stenosis in a focal area in the proximal vessel.  The  first large posterolateral branch has 40% disease in the mid to distal  vessel.   The right coronary artery arises and distributes normally.  It has  competitive flow from the vein graft.  There is diffuse 70% disease in  the proximal and distal vessel.   The saphenous vein graft to the first diagonal branch is widely patent.   The saphenous vein graft to the distal right coronary is widely patent.   The LIMA graft to the LAD is patent.  However, it tapers down to a very  small graft distally at the insertion and the predominant flow in the  LAD is via the native vessel.   Left ventricular angiography was performed in the RAO view.  This  demonstrates normal left ventricular size and contractility with normal  systolic function.  Ejection fraction is estimated at 65%.  There is no  mitral regurgitation or prolapse.  FINAL INTERPRETATION:  1. Three-vessel obstructive coronary artery disease.  2. Patent saphenous vein graft to the diagonal branch.  3. Patent saphenous vein graft to the distal right coronary artery.  4. Patent but atretic left internal mammary artery graft to the left      anterior descending, predominately due to significant competitive      flow down the native left anterior descending.  5. Normal left ventricular systolic function.   PLAN:  We would recommend continued medical therapy.  The marginal  vessels are too small to intervene on.           ______________________________  Peter M. Swaziland, M.D.     PMJ/MEDQ  D:  03/30/2009  T:  03/31/2009  Job:  [id number]   cc:   Colleen Can. Lisamarie Chalk, M.D.

## 2011-05-01 NOTE — Assessment & Plan Note (Signed)
OFFICE VISIT   [name], [name]  DOB:  Dec 09, 1951                                       08/10/2009  NFAOZ#:[id number]   [name] is a 60 year old female referred by Dr. Tenny Craw for evaluation  of carotid stenosis.  She is a 60 year old female who has had 2 months  of dizziness.  She says that the dizziness occurs primarily when she  goes from lying to standing position.  She also states that she tends to  fall to the right side when she becomes dizzy.  She has a history of  stroke in 2000 after coronary bypass grafting.  At that time, she had a  left-sided droop of her face and a left hemiparesis.  She also had some  clumsiness of her right hand.  This all resolved by the next day.  She  also states that she has had some TIAs in the past.  She stated that she  had some right hand clumsiness approximately 5 years ago and some  confusion.  This was worked up by Dr. Sharene Skeans with Neurology at that  time.  Apparently, she had a carotid angiogram then and carotid stenting  was entertained.  However, according to Dr. Fatima Sanger carotid  angiogram report, she had occlusion of the right internal carotid artery  and occlusion of the left internal carotid artery or high-grade stenosis  at the terminus intracranially.  She was treated medically at that time.  She apparently saw Dr. Suszanne Conners recently for her dizziness and did not have  an ENT explanation for this.  Her atherosclerotic risk factors include  diabetes, elevated cholesterol, hypertension, and coronary artery  disease.  She has been followed by Dr. Delfin Edis for her coronary  disease in the past.  She apparently had a cardiac catheterization by  Dr. Peter Swaziland several months ago which she stated did not have any  significant findings that needed treatment.   She does also have a history of congestive heart failure.  Past medical  history is otherwise remarkable for asthma.  Past medical history of  chronic back pain.   PAST SURGICAL HISTORY:  She has had laser ablation of a bleed in her  right eye, coronary bypass grafting, gastric bypass in 2002,  cholecystectomy at the same time.  She has had multiple cardiac  catheterizations in the past.   MEDICATIONS:  1. Protonix once daily.  2. Cymbalta 60 mg once a day.  3. Morphine 15 mg 1-3 p.r.n.  4. Extra Strength Tylenol 500 mg 2 daily with morphine.  5. Nitroglycerin p.r.n. chest pain.  6. Liquid calcium.   ALLERGIES:  1. REGLAN and causes her to vomit.  2. PENICILLIN causes anaphylactic reaction.  3. NEURONTIN causes severe tremors.   FAMILY HISTORY:  Remarkable for her mother having heart disease at age  less than 28.   SOCIAL HISTORY:  She is single, has 2 children, is retired.  She is a  former smoker but quit 20 years ago.   REVIEW OF SYSTEMS:  She is 5 foot 7, 160 pounds.  CARDIAC:  She occasionally has chest pain.  PULMONARY, GI, RENAL VASCULAR, NEUROLOGIC, ORTHOPEDIC, PSYCHIATRIC,  HEMATOLOGIC review of systems are otherwise negative.  ENT:  She has had some slight decrease of her eyesight and hearing  recently.   PHYSICAL EXAM:  Blood pressure is  128/80 lying flat in the right arm  with a pulse of 77, 106/69 sitting with a heart rate 85, 103/69 with  heart rate of 85 standing.  HEENT:  Unremarkable.  Neck:  2+ carotid  pulses without bruit.  Chest:  Clear to auscultation.  Cardiac:  Exam is  regular rate rhythm without murmur.  Abdomen:  Soft, nontender,  nondistended, no masses.  Extremities:  She has 2+ radial and femoral  pulses bilaterally.  She has 1+ popliteal pulses bilaterally.  She has a  2+ right dorsalis pedis pulse with absent posterior tibial pulse.  She  has 1+ dorsalis pedis and posterior tibial pulse in the left foot.  Neurologic:  Exam shows symmetric upper extremity and lower extremity  motor strength which is 5/5 bilaterally.  She has no pronator drift.   She had a carotid duplex scan  performed on August 18 at [facility] which showed a proximal 70% to 99% right internal carotid artery  stenosis and a high-grade left internal carotid artery stenosis  proximally.  She had normal vertebral artery antegrade flow bilaterally.  It was noted that there were low velocities in the left internal carotid  artery suggesting possible distal occlusion.   In summary, [name] has multiple atherosclerotic risk factors for  carotid occlusive disease.  She does have some dizziness but I am not  completely convinced this is related to her carotid disease.  She does  also have blood pressures consistent with orthostatic hypotension.  I  believe the best test for determining the amount of her carotid disease  is going to be a carotid arteriogram.  She previously was noted to have  occlusion of her right internal carotid artery with subtotal occlusion  with multiple distal lesions in the left internal carotid artery.  I  believe the arteriogram will further clarify the discrepancy between the  duplex ultrasound recently and the previous carotid angiogram.  I have  discussed with [name] today the risks, benefits, possible  complications, and procedure details including not limited to bleeding,  contrast reaction, stroke.  She understands and agrees to proceed.  This  is scheduled for Friday, August 27th.   Janetta Hora. Fields, MD  Electronically Signed   CEF/MEDQ  D:  08/10/2009  T:  08/11/2009  Job:  [id number]   cc:   Colleen Can. Seana Chalk, M.D.  Sigmund Hazel, M.D.  Vianne Bulls, M.D.

## 2011-05-04 ENCOUNTER — Telehealth: Payer: Self-pay | Admitting: Cardiology

## 2011-05-04 NOTE — Op Note (Signed)
[name], [name]              ACCOUNT NO.:  [id number]   MEDICAL RECORD NO.:  [id number]          PATIENT TYPE:  OBV   LOCATION:  1409                         FACILITY:  [facility]   PHYSICIAN:  Mark C. Vernie Ammons, M.D.  DATE OF BIRTH:  1951-09-04   DATE OF PROCEDURE:  06/25/2005  DATE OF DISCHARGE:                                 OPERATIVE REPORT   PREOP DIAGNOSIS:  Retained left renal calculi.   POSTOP DIAGNOSIS:  Retained left renal calculi.   PROCEDURE:  Second look nephroscopy, antegrade pyelogram with  interpretation, antegrade ureteroscopy and double-J stent placement.   SURGEON:  Mark C. Vernie Ammons, M.D.   ANESTHESIA:  General.   BLOOD LOSS:  Less than 10 mL.   DRAINS:  6-French, 26-cm, double-J stent in the left ureter (no string).   SPECIMENS:  None.   COMPLICATIONS:  None.   INDICATIONS:  The patient is a 60 year old white female who had a large left  renal calculus treated with percutaneous nephrostolithotomy on 06/21/2005.  She had a couple of retained fragments that appeared to be a low pole and is  brought to the OR, today, for relook nephroscopy in an attempt to extract  the remaining fragments. I discussed with the patient, the limitations of  visibility and also explained the calyceal anatomy; and, the fact that the  stones may not be accessible, through this nephrostomy tract. She  understands this as well as the risks and complications and elected to  proceed with surgery.   DESCRIPTION OF OPERATION:  After informed consent, the patient was brought  to the main OR and placed on the table and administered general anesthesia  and then moved to the prone position. Appropriate padding was placed.  A 24-  French Foley catheter was placed in the bladder and the nephrostomy site was  sterilely prepped and draped with the tube in place. The I then removed the  tube and initially inserted the 17-French flexible cystoscope. I noted some  clots in the renal pelvis.  These were extracted and flushed out. I then  visualized the renal pelvis and noted no stone fragments in that location. I  was able to get the scope easily into the upper pole and that appeared  entirely normal with no stones. I was also able to visualize the UPJ. There  appeared to be some whitish material, either clot or stone, I used the  nitinol basket to grasp this.  It turned out to be clot only.   I then injected direct contrast through the cystoscope under direct  fluoroscopic control. I was able to outline the calyceal anatomy relative to  the location of the tube and found that there appeared to be a lower pole  calix nearly parallel to the tube and slightly inferior. There was a middle  pole calix noted as well.   I then removed the flexible cystoscope and used a 6-French ureteroscope. I  was able to negotiate into the middle pole calix easily, inspected this and  noted no stones or other abnormality. I then flexed the scope on itself at  180 degrees,  in an attempt to anterior the lower pole calix. I was able to  eventually negotiate the scope into the opening of the calix, but was not  able to advance it back into its furthest reaches. I, therefore, used a  nitinol basket and passed this into the lower pole calix in attempt to  blindly grasp the stones, since they could not be seen on fluoroscopy. This  was unsuccessful at extracting any stone material.   My concern was that since I could not visualize any stones on the  fluoroscopy, that the further stones may have migrated down the ureter  potentially causing obstruction; and, therefore, passed a 0.038-inch floppy-  tip guidewire down the ureter under direct fluoroscopic control through the  flexible ureteroscope. I then passed the flexible ureteroscope over the  guidewire all the way into the bladder and removed the guidewire. I then  extracted the ureteroscope, slowly, in order to visualize the entire ureter  and found  no stones within the ureter. A second look with both ureteroscope  and the flexible cystoscope was then undertaken. Again, no stones could be  identified at any location accessible by the scopes.   I replaced the guidewire using the flexible cystoscope down the left ureter,  under fluoroscopic control, into the bladder; and then passed the 6-French,  double-J stent over the guidewire and used the pusher to fluoroscopically  place the renal curl within the renal pelvis and removed the guidewire. I  reinserted the flexible cystoscope to confirm that there was good curl in  the renal pelvis and that the stent was not protruding into the nephrostomy  tract. I then removed the flexible cystoscope and closed her skin incision  with Steri-Strips, 4 x 4s and a Tegaderm. The patient was awakened and taken  to recovery in stable, satisfactory condition. She tolerated procedure well.  No intraoperative complications. Her Foley catheter will be removed. She  will be observed in the hospital until this afternoon; and hopefully  discharged at that time.       MCO/MEDQ  D:  06/25/2005  T:  06/25/2005  Job:  [id number]

## 2011-05-04 NOTE — Op Note (Signed)
[facility]  Patient:    [name], [name] Visit Number: [id number] MRN: [id number]          Service Type: DSU Location: [facility] 2853 [id number] Attending Physician:  Ernesto Rutherford Dictated by:   Ernesto Rutherford, M.D. Proc. Date: 01/26/02 Admit Date:  01/26/2002 Discharge Date: 01/26/2002                             Operative Report  PREOPERATIVE DIAGNOSES: 1. 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 2. Proliferative diabetic retinopathy, right eye despite previous panretinal    photocoagulation. 3. Mild vitreous hemorrhage, right eye.  POSTOPERATIVE DIAGNOSES: 1. 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 2. Proliferative diabetic retinopathy, right eye despite previous panretinal    photocoagulation. 3. Mild vitreous hemorrhage, right eye.  PROCEDURE: 1. Posterior vitrectomy with membrane peel, OD. 2. Endolaser panretinal photocoagulation, OD. 3. Injection of vitreous substitute - air, OD to reattach the traction in the    retinal detachment involving the supertemporal arcade. 4. Endodiathermy for retinopathy.  SURGEON:  Ernesto Rutherford, M.D.  ANESTHESIA:  Local retrobulbar with monitored anesthesia control.  INDICATIONS FOR PROCEDURE:  The patient is a 60 year old woman with proliferative diabetic retinopathy of the right eye despite previous panretinal photocoagulation, now macular threatening detachment.  She also has potential for ocular ischemia on the basis of occlusive cerebrovascular disease of extensive nature.  This is an attempt to reattach the retina, but also to induce quiescence of proliferative diabetic retinopathy in the right eye.  She understands the risks of anesthesia including the rare occurrence of death, but also to the eye including hemorrhage, infection, scarring, need for further surgery, no change in vision, loss of vision, and progression of disease despite  intervention, loss of the eye, and progression of cataract in this presently mildly cataractous right eye.  She understands these risks and wished to proceed.  DESCRIPTION OF PROCEDURE:  After appropriate signed consent was obtained, she was taken to the operating room.  In the operating room, appropriate monitoring was followed by mild sedation.  Marcaine 0.75% delivered 5 cc retrobulbar by an additional 5 cc laterally in fashion of modified Darel Hong. No complications occurred.  The right periocular region was sterilely prepped and draped in the usual ophthalmic fashion.  A lid speculum applied. Conjunctival peritomy fashioned temporally and supranasally.  A 4 mm infusion was secured 4 mm posterior to the limbus in the inferotemporal quadrant. Placement in the vitreous cavity verified visually.  Superior sclerotomies were then fashioned.  Wild microscope was placed into position.  Core vitrectomy was then begun.  Modified en bloc and delamination techniques were then used to remove the dense fibrovascular attachment on the temporal arcades and on the optic nerve.  Hemostasis was obtained with endodiathermy. Endodiathermy was in fact a portion of the procedure, and the fourth portion of the procedure up there should be endodiathermy for retinopathy.  At this time, the vitreous skirt was elevated 360 degrees, and trimmed 360 degrees.  Care was taken to avoid lens contact.  No complications of this type occurred.  At this time, Endolaser photocoagulation was then carried out.  Fluid air exchange was then completed to flatten the traction of retinal detachment approximately 2.5 to 3 ________ in size with apparent old, but nonetheless fluid beneath the retinal detachment on the supertemporal arcade.  This flattened nicely.  Endolaser photocoagulation was  then carried out in the bed of the this detachment under air.  Air was left in place.  The superior sclerotomies were then closed with 7-0  Vicryl suture.  The infusion removed and similarly closed with 7-0 Vicryl suture.  Intraocular pressure assessed and found to be adequate.  Conjunctiva closed with 7-0 Vicryl suture. Subconjunctival injection with steroid applied.  The patient was given intravenous ciprofloxacin for prophylaxis.  A sterile patch and a Fox shield applied to the right eye.  The patient was awakened from the anesthesia without difficulty, tolerating it extremely well, and taken to the recovery room.  She is anticipated to go home through the short stay and be discharged home as an outpatient.  See me the next day in the office.  This is per the patients request to be at home as well and this is perfectly fine for her situation.  She has excellent home care. Dictated by:   Ernesto Rutherford, M.D. Attending Physician:  Ernesto Rutherford DD:  01/26/02 TD:  01/27/02 Job: [id number]

## 2011-05-04 NOTE — Procedures (Signed)
[facility]  Patient:    [name], [name]                     MRN: [id number] Proc. Date: 07/01/00 Adm. Date:  [id number] Attending:  Mick Sell                           Procedure Report  PROCEDURE:  Lumbar puncture.  DATE OF BIRTH:  06-Nov-1951.  INDICATIONS FOR PROCEDURE:  Evaluate patient for multiple sclerosis.  HISTORY OF PRESENT ILLNESS:  The patient is a 60 year old with onset of weakness in her legs and arms. She has bilateral white matter lesions with areas of dysmyelination and also cystic changes in the centrum semiovale. She has 1 area of enhancement in the deep left frontal white matter. There is no involvement on the corpus callosum or the brain stem. The MRI picture is consistent with but not diagnostic of multiple sclerosis. The patient has a cardiomyopathy and may also have had bilateral embolic stroke events.  After informed consent, the patient was sterilely prepped and draped with local anesthesia with 1% xylocaine. I made attempts with her sitting up to penetrate in the the L3-4, and L2-3 interspaces. I was unsuccessful and struck bone, or what appeared to be bone with the needle inserted deep inside her back. It was clear after several passes, that I was going to be unable to obtain spinal fluid.  The patient was then taken to the fluoroscopy suite where first Dr. Peggye Pitt and then Dr. Lynnette Caffey attempted both with 3.5 inch 20 gauge needles, 5.5 and 6 inch 20 gauge needles at L3-4, L4-5 and L5-S1. Each instance, it was clear that the needle passed to the level of the dura and then would angle to the right or the left away from the dural sac.  In the final attempt, the patient felt pain for the first and only time radiating down her left leg and thus decided that the procedure should be terminated before any injury occurred to her.  She will be sent home on ibuprofen 800 mg every 6h, OxyContin 20 mg every  12h, bedrest, fluids. She can go to work tomorrow if she feels up to it. In the last pass, a small amount of bloody spinal fluid was obtained. There was also a small chance that a dural leak could occur at this site and the patient could develop a post lumbar puncture headache. The last time that this was done 9 years ago, she had 8 days of spinal headache. We will treat that with bedrest, abdominal binder, caffeine. It may be very difficult to perform a blood patch at what would have been the L3, L4 interspace. I will discuss another means to obtain spinal fluid with [facility]. The patient tolerated the procedure with difficulty. DD:  07/01/00 TD:  07/02/00 Job: [id number]
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
## 2011-05-04 NOTE — Consult Note (Signed)
NAME:  [name], [name] NO.:  [id number]   MEDICAL RECORD NO.:  [id number]                   PATIENT TYPE:  EMS   LOCATION:  MAJO                                 FACILITY:  [facility]   PHYSICIAN:  Abigail Miyamoto, M.D.              DATE OF BIRTH:  1951/08/10   DATE OF CONSULTATION:  DATE OF DISCHARGE:                                   CONSULTATION   CHIEF COMPLAINT:  Abdominal pain.   HISTORY:  This is a 60 year old female who has had multiple abdominal  procedures including a gastric bypass and a ventral hernia repair and  panniculectomy back in October 2003.  She developed tenderness and erythema  over the lower aspect of her transverse incision last evening and presented  to the emergency room for evaluation.  She denies any fever or chills.  She  has had no nausea or vomiting and no diffuse abdominal pain.  She has no  chest pain, fever, or shortness of breath.   PAST MEDICAL HISTORY:  Past medical history, again, is significant for a CVA  and morbid obesity.   PAST SURGICAL HISTORY:  Previous surgical history, again, includes the  gastric bypass and then the ventral hernia repair as well as a  panniculectomy.   ALLERGIES:  She is allergic to PENICILLIN.   MEDICATIONS:  Coumadin, Effexor, Actos, and insulin.   REVIEW OF SYSTEMS:  Otherwise negative.   PHYSICAL EXAMINATION:  GENERAL:  Physical examination reveals a mildly obese  female in no acute distress.  VITAL SIGNS: Temperature is 98.3, heart rate is 77, blood pressure 172/51,  respiratory rate 16.  LUNGS:  Clear to auscultation bilaterally.  CARDIOVASCULAR:  Regular rate and rhythm.  ABDOMEN:  Soft.  There are multiple well-healed incisions.  There is an area  of tenderness and fluctuance at the midpoint of the low transverse incision  where the upper midline incision comes into it.  There is no crepitus.  The  rest of the abdomen is soft and there are no palpable hernias.  EXTREMITIES:  Warm and well perfused.   LABORATORY DATA:  The patient has a normal white blood count of 7.8, her PT  is 16 with an INR of 1.3.  CAT scan of the abdomen and pelvis was performed  which showed superficial inflammation in the abdominal wall without evidence  of a hernia and a question of an abscess.   ASSESSMENT AND PLAN:  This is a 60 year old female with superficial  infection in the abdominal wall.  My concern, at this point is whether there  is infection of her mesh chronically.  I did prep the abdomen with Betadine  and then scrubbed with 1% lidocaine and made a small incision with the #11  blade and drained purulence from the superficial abscess.  This did not  appear to extend deep.  No bowel was involved.  The wound was washed with  saline  a day packed with the gauze.   At this point, I will place her on Levaquin daily and she was given wound  care instructions. I have also given her a prescription of Percocet for  pain.  She will be following back up with her doctors at [facility] in  [place] next week for a postoperative visit and they will continue to  follow her infection, at this time.                                               Abigail Miyamoto, M.D.    DB/MEDQ  D:  04/05/2003  T:  04/05/2003  Job:  [id number]

## 2011-05-04 NOTE — Telephone Encounter (Signed)
Fax: [phone] Latest EKG, STRESS, ECHO, OV

## 2011-05-04 NOTE — Op Note (Signed)
[place]  Patient:    [name]                      MRN: [id number] Proc. Date: 12/19/99 Adm. Date:  [id number] Attending:  Charlett Lango CC:         Colleen Can. Yu Chalk, M.D.                           Operative Report  PREOPERATIVE DIAGNOSIS:  Sternal nonunion.  POSTOPERATIVE DIAGNOSIS:  Sternal nonunion.  OPERATION:  Sternal debridement and rewiring.  SURGEON:  Salvatore Decent. Dorris Fetch, M.D.  ANESTHESIA:  General.  FINDINGS:  Nonunion of lower half of sternum.  Upper half of sternum solidly healed. 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  No evidence of infection.  INDICATIONS:  [name] underwent coronary artery bypass grafting in June. Postoperatively, she did well but did develop chronic sternal pain.  This pain interfered significantly with her ability to work which required lifting and resulted in disability.  On physical examination, there was instability of the lower half of the sternum  where the primary pain was.  Chest x-ray showed the wires completely intact. CT scan was performed which showed the nonunion in the lower half of the sternum.   The patient was advised that debridement and rewiring would be required to achieve union of the sternum but this was not guaranteed due to her size and previous nonhealing.  The indications, risks, and benefits of the procedure were discussed in detail ith the patient and she did understand that there was no guarantee that her pain would be improved by the procedure.  She understood these issues and agreed to proceed.  DESCRIPTION OF PROCEDURE:  [name] was brought to the preoperative holding area on December 19, 1999.  Intravenous access was obtained.  The patient was taken to the operating room, anesthetized, and intubated.  The chest, abdomen, and upper legs were prepped and draped in the usual fashion.  PAS hose were applied to  her lower legs.  The patient was given intravenous antibiotics with vancomycin.  An incision was made through the pre-existing scar.  It was carried through a thick layer of subcutaneous fat down to the sternum.  The pectoralis fascia was divided down to the bony cortex.  There was a clear plane of nonunion into the pseudojoint in the lower half of the sternum.  No plane could be identified in the upper half of the sternum.  The bone anteriorly was solidly healed.  Using a laminar spreader to separate the two halves of the sternum, careful dissection was carried out.  The synovial membrane was debrided and the cortical bone was debrided back to good bleeding bone.  There was thinning of the cortex and of the cancellous bone consistent with osteoporosis.  The underlying mediastinal contents then were carefully dissected off the underside of each half of the sternum.  There was bleeding from the epicardial surface which was controlled with a single 5-0 Prolene suture.  This was not from a graft or rom a major epicardial vessel.  The sternal wires were then placed.  Two figure-of-eight wires were placed beginning above the area of nonunion with the first wire pulling up the wires. The uppermost wire tore through the left half of the hemisternum.  An area of severe osteoporosis and previous transverse fracture.  The wires were removed and the wires were placed  once again.  The left hemisternum is approximated at the fourth interspace above and below the interspace where the transverse fracture was. It was extremely friable and again the wire pulled through this region.  The wires were then pulled together and the sternal halves were reapproximated.  The wires were carefully tightened.  The pectoralis fascia was then closed with a running #1 Vicryl suture.  The subcutaneous tissue was closed with a running 2-0 Vicryl suture and the skin was closed with staples.  All sponge,  needle, and instrument counts were correct at the end of the procedure.  Of note, a #15 Blake drain was left below the sternum and a #19 Blake drain was  left in the subcutaneous tissue.  Both were placed to bulb suction.  All sponge, needle, and instrument counts were correct at the end of this procedure.  There  were no intraoperative complications.  The patient was taken from the operating room to the postanesthetic care unit and extubated in a stable condition. DD:  12/19/99 TD:  12/20/99 Job: [id number]

## 2011-05-04 NOTE — Op Note (Signed)
NAMEFRANCHELLE, FOSKETT              ACCOUNT NO.:  [id number]   MEDICAL RECORD NO.:  [id number]          PATIENT TYPE:  OBV   LOCATION:  1409                         FACILITY:  [facility]   PHYSICIAN:  Excell Seltzer. Annabell Howells, M.D.    DATE OF BIRTH:  1951/03/06   DATE OF PROCEDURE:  06/22/2005  DATE OF DISCHARGE:                                 OPERATIVE REPORT   PROCEDURE:  Left percutaneous nephrolithotomy.   PREOPERATIVE DIAGNOSIS:  A large left renal stone.   POSTOPERATIVE DIAGNOSIS:  A large left renal stone.   SURGEON:  Dr. Bjorn Pippin.   RADIOLOGIST:  Dr. Irish Lack   ANESTHESIA:  General.   DRAINS:  24-French Ainsworth nephrostomy tube and safety catheter and Foley  catheter.   SPECIMEN:  Stones.   COMPLICATIONS:  None.   INDICATIONS:  [name] is a 60 year old white female with a history of  gastric bypass, who was seen for hematuria.  She had actually passed a small  ureteral stone on the right but was found to have a greater than 2 cm left  renal pelvic stone that was felt to require percutaneous nephrolithotomy.   FINDINGS:  At procedure, the patient was given preoperative antibiotic  therapy.  She was fitted with thigh TED and PAS hose.  She was taken to the  operating room where general anesthetic was induced on the holding room  stretcher.  She was then rolled prone onto chest rolls.  All pressure points  were padded, and she was positioned for the percutaneous nephrolithotomy.  Her left flank which had been previously marked was prepped with Betadine  solution, and she was draped in the usual sterile fashion with a Lingeman  drape.   Once she was draped Dr. Irish Lack came in and performed the tract  dilation using a Track Master balloon, and he left the sheath in after  removal of the balloon.   I then performed percutaneous nephrolithotomy.  The rigid nephroscope was  used to visualize the stone which was engaged with the combination  lithoclast ultrasonic  device and fragmented.  The fragments were then  removed using a three-pronged grasper.  One fragment was engaged with the  grasper, but it was too large to remove but could not be disengaged from the  grasper, so the fragment was removed along with the sheath.  The sheath was  then re-placed using the Track Master balloon, and additional fragments were  removed.  At this point, fluoroscopy revealed no obvious residual fragments.  The sheath was removed once again, and a 24-French Ainsworth catheter was  passed over the safety wire into the renal pelvis.  The balloon was filled  with 3 mL of sterile fluid.  A Compy safety catheter was placed over the  remaining wire, down to the level of the distal ureter, and the wires were  removed.  An antegrade nephrostogram at this point confirmed good placement  of the nephrostomy tube.  The nephrostomy tube was then secured in place  with a silk suture and placed to straight drainage.  The drapes were then  removed  and a dressing of 4x4s, ABDs, and Hypafix tape was applied.  The  catheter tube was secured to the patient's leg.   At this point, she was rolled back supine on a recovery room stretcher;  anesthetic was reversed.  She was removed to the recovery room in stable  condition.  There were no complications.       JJW/MEDQ  D:  06/22/2005  T:  06/22/2005  Job:  [id number]   cc:   Colleen Can. Vasiliki Chalk, M.D.  Fax: [phone]   Sigmund Hazel, M.D.  [address]  [address] [place], [place] [address]  Fax: [phone]

## 2011-05-04 NOTE — Discharge Summary (Signed)
[name], [name]              ACCOUNT NO.:  [id number]   MEDICAL RECORD NO.:  [id number]          PATIENT TYPE:  INP   LOCATION:  1409                         [facility]ACILITY:  [facility]   PHYSICIAN:  Excell Seltzer. Annabell Howells, M.D.    DATE OF BIRTH:  1951/07/15   DATE OF ADMISSION:  06/22/2005  DATE OF DISCHARGE:  06/26/2005                                 DISCHARGE SUMMARY   [name] is a 60 year old white female with a large left renal stone. She  has a history of gastric bypass and had 175 pound weight loss and stone  formation following that procedure. She had also had a 2-3 mm left distal  ureteral stone, small right renal calculi.   PAST MEDICAL HISTORY:  Significant for allergy to PENICILLIN. Admission  medications include  Actos, lovastatin, Requip, Cymbalta, Citracal, Viactiv, and Niferex. She had  been on Coumadin but that had been stopped. Medical history was significant  for coronary artery disease with prior heart failure, diabetes, anemia,  history of stroke in 1995, glaucoma, malabsorption syndrome, hyperlipidemia  and osteoporosis.   PAST SURGICAL HISTORY:  Pertinent for coronary bypass in 1995, hysterectomy  in 1994, cardiac ablation for atrial fibrillation in 1993, carotid artery  stents in 2001 and 2002, gastric bypass in 2003, left shoulder surgery and  abdominoplasty with panniculectomy.   SOCIAL HISTORY:  She was a two pack a day smoker for 20 years, quit in 1989,  she denies alcohol.   For additional details of the history and physical, please see the office  H&P attached to the chart.   HOSPITAL COURSE:  Prior to admission, her Coumadin had been stopped. On the  day of admission, she was taken to the x-ray suite where she underwent  placement of a left percutaneous nephrostomy tube. She was then taken to the  operating room where she underwent a left percutaneous nephrolithotomy.  Postoperatively her calcium was diminished, so she was given calcium  gluconate. Her PCA  morphine was not adequate so it was switched to PCA  Dilaudid. She had been placed on Cipro postoperatively. On the first  postoperative day, her pain was well controlled with Dilaudid, she was  afebrile, vital signs were stable. She was noted to have some elevated liver  function tests and a GI consult was obtained. Dr. Evette Cristal saw the patient and  felt that with her prior history that the enzymes were likely related to the  stress of surgery and should resolve spontaneously. Her Foley catheter was  discontinue on the first postoperative day. Her potassium was 5 so her  potassium was removed from her IV. Hemoglobin was 10. A KUB with tomography  was performed on the first postoperative day, this revealed residual 3 and 4  mm fragments in the left lower pole and it was felt that a second look  procedure would be indicated. On the second postoperative day, she continued  to do well, tube was draining bloody fluid but was draining well. An  ultrasound was obtained looking for liver and gallbladder issues, no  abnormalities were found other than the renal calcifications. A  hepatitis  profile was obtained and repeat LFT's were obtained on July 8 and there was  an improvement in her numbers with an alkaline phosphatase of 286, AST of 56  and ALT of 85, iron level of 39, ceruloplasmin of 28. On July 9 which would  be the third postoperative day, urine was clear from her nephrostomy tubes.  The abdominal ultrasound was reviewed by GI and they did feel that the  common bile duct was slightly dilated to 10 mm with some __________  echotexture to the __________ liver suggestive of fatty liver. Her  hemoglobin had apparently dropped to 8.8 on July 9 and a 2 unit transfusion  was ordered. Post transfusion hemoglobin was 10.4. On July 10, she was taken  back to the operating room for a second loop nephroscopy, antegrade  pyelogram and ureteroscopy by Dr. Vernie Ammons. He was not able to find any  significant  residual calculations despite a very thorough inspection. On  July 11, she was felt to be ready for discharge home with a diagnosis of  left renal calculi, left distal ureteral calculi. Her admission was  complicated by acute blood loss anemia requiring transfusion and also  elevated liver function studies requiring GI consult.   DISCHARGE MEDICATIONS:  Tylox, Cipro 250 mg twice daily. She was instructed  to resume her home medications with the exception of Coumadin which will be  held for an additional several days. She was instructed to followup with me  in one week. A GI followup was arranged by Dr. Evette Cristal.   DISPOSITION:  To home. Please note on July 10 following her second look  nephrostomy, her nephrostomy tube was removed and a 6 French 26 cm double-J  stent was left indwelling.   CONDITION:  Improved.   PROGNOSIS:  Good.      Excell Seltzer. Annabell Howells, M.D.  Electronically Signed     JJW/MEDQ  D:  07/30/2005  T:  07/30/2005  Job:  [id number]   cc:   Graylin Shiver, M.D.  [address]  [address]  [place], [place] [address]  Fax: [phone]   Sigmund Hazel, M.D.  [address]  [address] [place], [place] [address]  Fax: [phone]   Colleen Can. Keani Chalk, M.D.  Fax: [phone]

## 2011-05-04 NOTE — Consult Note (Signed)
[place]. [facility]  Patient:    [name]                      MRN: [id number] Proc. Date: 12/22/99 Adm. Date:  [id number] Attending:  Charlett Lango Dictator:   [id number]                          Consultation Report  CHIEF COMPLAINT:  Left sided weakness.  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 As reported in her medical record, she had some pain, nausea, some deconditioning.  She was put on a PCA pump which was discharged yesterday.  As noted by nursing staff she was drowsy and not very responsive all day yesterday.  The patient was taking off the PCA pump and put on p.o. meds for pain.  This morning while cardiac rehab was ambulating the patient it was noted  that she was not moving her left side of her body very well.  Dragging her leg nd decreased strength in her hand grip on the left side.  The patient denies prior  history of strokes or TIAs.  She denies slurred speech, no facial changes, no dysarthria.  The patient has been scheduled to have a CAT scan of her head this  morning.  PAST MEDICAL HISTORY:  Quite extensive includes diabetes, hypertension, coronary artery disease, congestive heart failure, asthma, anemia, pseudotumor cerebri, hypercholesterolemia, morbid obesity.  MEDICATIONS:  Toprol, Diamox, Lasix, K-Dur, Lopid, Glucophage, insulin, Prilosec, Celexa, aspirin, Vioxx, OxyContin and Percocet.  ALLERGIES:  Penicillin.  SOCIAL HISTORY:  She is married.  She has two children.  She quit smoking 11 years ago, two packs per day.  She denies alcohol intake.  FAMILY HISTORY:  Significant for morbid obesity in most family members.  Mother  died at age 26 of coronary artery disease. Father  died at age 75 history of hypertension.  One of her son has diabetes as well.  REVIEW OF SYSTEMS: As per history of present illness.  PHYSICAL EXAMINATION:  The patients blood pressure is 114/60, pulse 106, respirations 18, temperature 99.0.  The patient is morbidly obese lying on her ed in no acute distress.  Her head is normocephalic and atraumatic.  Neck is supple. There is no bruits.  The patients lungs are clear bilaterally.  Her heart sounds are regular in rate.  Abdomen is soft.  Bowel sounds are present.  Extremities: No cyanosis or edema.  Neurologic exam:  She is awake and alert.  She is oriented.  Speech is fluent.  Memory and language are normal.  Her Cranial nerves II through XII:  Her pupils are equal and reactive bilateral. Extraocular cephalic movements intact.  Face is symmetric.  Tongue is in the midline.  Palate is elevated symmetrically.  Motor examination:  Displaced left hemiparesis.  Strength 4/5 with decreased fine motor control of her hand and her foot.  Reflexes are +1 upper extremity, +2 lower extremity.  Plantars are downgoing.  Coordination:  Slow movements on her left upper and left lower extremity.  Gait not evaluated at this time.  TESTING:  Have reviewed a previous echocardiogram performed in May 1988 had shown left atrium measurement of 4.1 cm.  LV and diastolic dimension 3.4.  LV and systolic dimension 1.8.  Technically suboptimal study.  Normal aortic root diameter, normal mitral valve, normal LV internal dimension.  Moderate left ventricular hypertrophy. There is no mention about her left ventricular ejection fraction.  Also has reviewed a report of a carotid ultrasound performed on May 30, 1999 as showing mild noncalcific plaque in the bifurcation and ECA bilaterally. No significant ICA stenosis.  Vertebral artery flow is antegrade.  IMPRESSION: 1. Right subcortical infarction, small vessel disease. 2. Hypertension. 3. Diabetes. 4.  Coronary artery disease. 5. Congestive heart failure. 6. Morbid obesity.  PLAN AND RECOMMENDATIONS:  Diagnoses, condition and further interventions were discussed with the patient at the bedside.  Further recommendations will include completion and reevaluation of testing for cerebrovascular disease including a repeat 2D echocardiogram with Doppler for cardiac evaluation.  Also, noninvasive evaluation of the intracranial vessel is recommended, at least with a transcranial Doppler in view of the difficulties which may be obtaining a formal MRI and MRA of her brain due to her morbid obesity.  At this time, would like to add Plavix 75 mg q.d. for secondary stroke prevention.  CT scan of her head needs to be completed this morning to exclude any other cranial abnormalities.  Suspect the patient will need inpatient physical therapy and occupational therapy in rehabilitation.  Thank you for allowing me to participate in the care of this patient. DD:  12/22/99 TD:  12/22/99 Job: [id number]

## 2011-05-04 NOTE — Assessment & Plan Note (Signed)
OFFICE VISIT   SHANTIKA, BERMEA  DOB:  Jul 19, 1951                                       08/17/2009  ZOXWR#:[id number]   Ms. Scaff was referred on August 25th for evaluation of possible high-  grade carotid stenosis.  She underwent carotid angiogram on August 12, 2009.  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  She does not need any intervention for these currently.  Fortunately, she has not really had any stroke symptoms.  The carotid  arteries are unreconstructible bilaterally.  She has very large  vertebral arteries which have probably enlarged to improve circulation  in her brain overall.  I do not believe she needs any further  intervention at this point.  She will follow up with me on an as-needed  basis.   Janetta Hora. Fields, MD  Electronically Signed   CEF/MEDQ  D:  08/17/2009  T:  08/18/2009  Job:  [id number]   cc:   Colleen Can. Lezly Chalk, M.D.  Sigmund Hazel, M.D.  Miguel Aschoff, M.D.

## 2011-05-04 NOTE — Consult Note (Signed)
[name], [name]              ACCOUNT NO.:  [id number]   MEDICAL RECORD NO.:  [id number]          PATIENT TYPE:  AMB   LOCATION:  SDS                          FACILITY:  [facility]   PHYSICIAN:  Marin Roberts, MDDATE OF BIRTH:  20-May-1951   DATE OF CONSULTATION:  08/18/2009  DATE OF DISCHARGE:  08/12/2009                                 CONSULTATION   REFERRING PHYSICIAN:  Janetta Hora. Fields, MD   REASON FOR CONSULTATION:  Intracranial interpretation of carotid  arteriogram.   FINDINGS:  Injection of the right common carotid artery demonstrates  reflux into the right vertebral artery with filling of the  vertebrobasilar system.  There is no significant right-sided  intracranial flow.  There is filling of the external carotid system.  There is a small cluster of vessels where I would expect the ICA  terminus to be.  No discrete ICA terminus is identified.  A faint blush  of MCA branch vessels can be appreciated on the lateral view.   Injection of the left common carotid artery also demonstrates no  significant proximal MCA or ACA vessels.  The external carotid system  fills robustly.  The MCA and ACA branch vessels are not well seen.   There is no significant filling of the dural sinuses on the left-sided  injection.  The right-sided injection does demonstrate filling of the  left transverse sinus and jugular sinus.  The studies were compared to  previous arteriogram dated September 19, 2001.  At that point, there was a  small amount of flow through the distal left internal carotid artery.  That flow was no longer appreciated.   IMPRESSION:  1. No significant distal internal carotid artery flow at the skull      base on either side.  2. Faint filling of MCA and ACA branch vessels on the right without      significant proximal MCA or ACA filling.  These may be filling by      collaterals.  3. No significant MCA or ACA filling from the left-sided injection.      Marin Roberts, MD  Electronically Signed     CM/MEDQ  D:  08/18/2009  T:  08/19/2009  Job:  [id number]

## 2011-05-04 NOTE — Op Note (Signed)
NAMEKEIVA, DINA                        ACCOUNT NO.:  [id number]   MEDICAL RECORD NO.:  [id number]                   PATIENT TYPE:  OIB   LOCATION:  2550                                 FACILITY:  [facility]   PHYSICIAN:  Alford Highland. Rankin, M.D.                DATE OF BIRTH:  1951/05/28   DATE OF PROCEDURE:  09/02/2002  DATE OF DISCHARGE:                                 OPERATIVE REPORT   PREOPERATIVE DIAGNOSES:  1. Dense premacular vitreous hemorrhage of the left eye with risk of retinal     detachment.  2. Progressive proliferative diabetic retinopathy, left eye, despite     previous panphotocoagulation.   POSTOPERATIVE DIAGNOSES:  1. Dense premacular vitreous hemorrhage of the left eye with risk of retinal     detachment.  2. Progressive proliferative diabetic retinopathy, left eye, despite     previous panphotocoagulation.  3. Tractional detachment, both temporally, nasally and superotemporally, of     the left eye.   PROCEDURES:  1. Posterior vitrectomy with membrane peel, left eye.  2. Endolaser panphotocoagulation, left eye.  3. Endodiathermy of retinopathy, left eye.   SURGEON:  Alford Highland. Rankin, M.D.   ANESTHESIA:  Local retrobulbar and monitored anesthesia control.   INDICATIONS:  The patient is a 60 year old woman with advanced  cardiovascular and diabetic retinopathy and has developed dense premacular  vitreous hemorrhage overlying the macular region with profound vision loss  and vitreous tabletop macular detachment.  This will attempt to reattach the  retina.  This will also attempt to clear the vitreous hemorrhage and also  attempt to induce quiescence of the proliferative diabetic retinopathy and  improve visual acuity.  Informed consent was obtained.  She was taken to the  operating room.  She understood the risks including but not limiting to  hemorrhage, infection, scarring, need for surgery, no change in vision, loss  of vision, progress of disease  despite intervention. In the operating room  appropriate monitoring was provided followed by mild  0.5% Marcaine  delivered 5 cc retrobulbar and additional 5 cc laterally and ventrally in  modified van Lint.  The left ocular region was sterilely prepped and draped  in the usual ophthalmic fashion.  A lid speculum was applied.  A conjunctiva  peritomy was fashioned temporally and superonasally.  A 4-mm fusion secured  4 mm posterior to the limbus and inferior temporal quadrant.  Placement in  the vitreous cavity was verified visually.  The superior sclerotomy was then  fashioned.  A _________ stent was placed in position for retinal detachment.  Core vitrectomy was then begun.  Modified en bloc dissection was then used  to remove posterior hyaloid and evacuate the preretinal hemorrhage.  Scissors delamination was then carried out for the vitreal retinal  detachments.  No complications occurred.  Retinal hole was noted nasal to  the optic nerve.  This was marked with endodiathermy.  Bleeding  sites were  diathermized with internal diathermy.  The vitreous was then elevated  anterior to the equator at 360-degrees and trimmed.  Scleral depression was  then used inferiorly to remove vitreous hemorrhage in the vitreous base.  At  this time, endolaser photocoagulation was then carried out at 350-degrees as  well as over the sites of previous neovascularization peripherally.  Hemostasis was spontaneous.  At this time, the instruments were removed from the eye and no peripheral  retinal holes or tears identified on  examination.  The sclerotomy was closed with 7-0 Vicryl suture.  The  infusion wound was closed with 7-0 Vicryl suture.  Conjunctiva closed with 7-  0 Vicryl suture.  Subconjunctival injections of antibiotics were applied.  The patient tolerated the procedure with no complications.                                               Alford Highland Rankin, M.D.    GAR/MEDQ  D:  09/02/2002  T:   09/03/2002  Job:  [id number]

## 2011-05-04 NOTE — Discharge Summary (Signed)
[place]. [facility]  Patient:    [name], [name] Visit Number: [id number] MRN: [id number]          Service Type: SUR Location: 3000 [id number] Attending Physician:  Mick Sell Dictated by:   Genene Churn. Love, M.D. Admit Date:  09/18/2001 Discharge Date: 09/28/2001   CC:         Gwen Pounds, M.D.  Roosvelt Harps, M.D.  Dr. Corliss Skains, M.D.-neuroradiologist   Discharge Summary  DATE OF BIRTH:  January 23, 1951  This is one of several [facility] admissions for this 60 year old right-handed white married female from [place], [place] admitted for evaluation of procedure to treat vasculopathy.  HISTORY OF PRESENT ILLNESS:  [name] has a known history of pseudotumor cerebri with obesity and subcortical right brain stroke secondary to small vessel disease.  She has risk factors of hypertension, diabetes mellitus, coronary artery disease, congestive heart failure, morbid obesity, and anemia. She was admitted by Dr. Sharene Skeans on September 18, 2001 with a history of dysphagia and dysgraphia on September 08, 2001 when her husband was a patient at [facility].  She has been evaluated as an outpatient and thought to have bilateral lesions in the parietal watershed white matter and carotid signals to show very severe signal loss in the right internal carotid artery with diminished signal also in the left internal carotid artery.  Because of this, she was admitted for further evaluation.  PAST MEDICAL HISTORY:  Significant for insulin-dependent diabetes mellitus with retinopathy, vertical banded gastroplexy with gastrojejunostomy for obesity, hypertension, congestive heart failure, peripheral neuropathy, retinopathy with right retinal hemorrhage, depression.  She has had a history of congestive heart failure.  MEDICATIONS: 1. Diamox 250 mg per day for pseudotumor cerebri. 2. Glucophage 500 mg XR two in the morning for  diabetes mellitus. 3. K-Dur 10 mEq b.i.d. for hypokalemia. 4. Plavix 75 mg q.d. 5. Celexa 40 mg q.d. 6. Toprol XL 50 mg q.d. 7. Lantus insulin 40 units at bedtime with a sliding scale of Humalog for    blood sugars greater than 150.  ALLERGIES:  PENICILLIN, poor drug tolerance to TRICOR.  SOCIAL HISTORY:  Patient works for the [employer].  PHYSICAL EXAMINATION  GENERAL:  Well-developed, obese white female.  VITAL SIGNS:  Weight 235 pounds, sitting blood pressure 112/62, heart rate 58, respiratory rate 18, temperature 97.4, O2 saturation 93%.  NEUROLOGIC:  MMSE 29/30, 20/50 right eye, 20/30 left eye.  She had had a previous bleed in her right eye.  She had absent reflexes in the lower extremities.  She had bilateral plantar flexor responses.  LABORATORIES:  Urine showed no growth.  Total cholesterol 108, triglycerides 119, HDL 29, LDL 55.  Hemoglobin A1C 7.0.  Sodium 138, potassium 3.7, chloride 106, CO2 content 28, glucose 122, BUN 15, creatinine 0.7, calcium 9.2, total protein 6.7, albumin 3.4 with repeat chemistries normal.  PTs in the hospital initially 15.2 with INR 1.3, and PTT 28.  She subsequently was treated with heparin and was therapeutic with PTT.  She subsequently was switched to Coumadin 10 mg per day x 3 days and had an INR of 3.2 the day of discharge. Her last CBC revealed a hemoglobin of 11.6, hematocrit 35.1, white blood cell count 7900, platelet count 361,000 on September 28, 2001.  PTT was 93 while still on heparin.  EKG showed unusual P axis, possible ectopic atrial bradycardia.  The telemetry showed sinus rhythm in the hospital.  Chest x-rays showed  no evidence of active disease.  Carotid cerebral arteriogram September 19, 2001 showed complete occlusion of the right internal carotid artery just distal to the bulb without distal reconstitution, three areas of severe focal stenosis involving the left internal carotid artery at the cranial skull base  with antegraded flow demonstrated in the distal middle artery and retrograde opacification of the right middle cerebral and right anterior cerebral arteries via the ipsilateral peak from the vertebral arteries and extensive left meningeal collaterals from the left A3 and left P3 branches opacifying the left hemisphere.  A angioplasty procedure was performed on September 23, 2001 which was possibly associated with dissection of the left ICA.  There was reconstitution of the supraclinoid, left CA, left middle cerebral artery branches, retrograde through the external collaterals.  There was no hemodynamic or neurologic complications with this so the procedure was not performed.  It was possibly associated with the complications of dissection. She was treated with Reopro. She had no neurologic deficits with this.  She was switched from heparin to Coumadin therapy.  She was seen in consult by Dr. Luther Parody because of some question of GI blood loss and underwent colonoscopy and EGD which showed no source of bleeding.  Patients protime the day of discharge was 3.2.  Her hemoglobin was 11.6, hematocrit 35.1, white blood cell count 7900, platelet count 361,000.  IMPRESSION:  1. History of transient left brain ischemic attack (code 435.9).  2. 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  3. Insulin-dependent diabetes mellitus (code 250.60).  4. Morbid obesity (code 278.01).  5. Hypertension (code 796.2).  6. Depression (code 311).  7. Peripheral neuropathy (code 357.2).  8. Retinopathy with right retinal hemorrhage (code unknown).  9. History of pseudotumor cerebri (code unknown). 10. Vertical banded gastroplexy with gastrojujenostomy. 11. Gastrointestinal bleeding (code 578.9).   PLAN:  Discharge the patient on Coumadin.  She will hold todays dose and  go to 5 mg q.d. at 6 p.m. on Monday and Tuesday with protime on Tuesday and then switch to 2.5 mg alternating with 5 mg.  DISCHARGE MEDICATIONS: 1. Diamox 250 mg q.d. 2. Glucophage 500 mg XR two q.a.m. 3. K-Dur 10 mEq b.i.d. 4. Celexa 40 mg q.d. 5. Toprol XL 50 mg q.d. 6. Lantus insulin 40 units q.h.s. with sliding scale. 7. Maalox 30 cc q.d.  ACTIVITY:  She is discharged not to drive a car for three weeks.  DIET:  2000 calorie ADA diet.  FOLLOW-UP:  Have a CBC and protime September 30, 2001 and weekly.  She will return to Dr. Sharene Skeans in two weeks and to be seen by Demetrio Lapping, P.A. at that time. Dictated by:   Genene Churn. Love, M.D. Attending Physician:  Mick Sell DD:  09/28/01 TD:  09/28/01 Job: [id number]

## 2011-05-04 NOTE — Discharge Summary (Signed)
[place]. [facility]  Patient:    [name]                      MRN: [id number] Adm. Date:  [id number] Disc. Date: 12/25/99 Attending:  Charlett Lango Dictator:   Loura Pardon, P.A. CC:         Salvatore Decent. Dorris Fetch, M.D.             Colleen Can. Debby Chalk, M.D.             Jonelle Sports. Cheryll Cockayne, M.D.                           Discharge Summary  DATE OF BIRTH:  05-Jun-1951  FINAL DIAGNOSES: 1. 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 2. Left hemiparesis postoperative day #3 this admission--reversible ischemic    neurologic defect.  CT negative for infarct.  Motor incoordination deficits ow    resolved.  SECONDARY DIAGNOSES: 1. Atherosclerotic coronary artery disease. 2. Status post coronary artery bypass graft surgery x 20 May 1999. 3. Congestive heart failure. 4. Hypertension. 5. Asthma. 6. History of anemia. 7. Pseudotumor cerebri. 8. Hypercholesterolemia. 9. Morbid obesity.  DISPOSITION:  [name] is judged suitable for discharge on postoperative day 6. She underwent rewiring for sternal nonunion on December 19, 1999.  She tolerated he procedure well.  She had been ready for discharge with oral analgesia and her regular medications on postoperative day #3; however, ambulating that morning she was dragging her left foot and she had clumsiness with the left hand.  A developing left hemiparesis was diagnosed.  Dr. Iran Planas, neurologist, was consulted.  He ordered a CT of the brain, a 2-D echocardiogram, transcranial Dopplers, and he started n Plavix, as well as aspirin.  In addition, he ordered physical therapy and occupational therapy consults while she was in-house.  The CT of the brain was negative for acute infarct.  A transcranial Doppler was negative for evidence of abnormalities.  By postoperative day #5, [name] had regained significant improvement in both motor and coordination of  the left upper extremity and left  lower extremity.  The grip was 4/5 in the left hand and she was ambulating independently.  By postoperative day #6, she had complete resolution of her left hemiparesis.  She at no time had confusion or agitation.  Her speech had always  remained clear.  She was judged a suitable candidate for discharge December 25, 1999, postoperative day #6.  DISCHARGE MEDICATIONS:  1. Percocet 5/325 1-2 tablets p.o. q.4-6h.  2. Plavix 75 mg daily with food.  3. Enteric-coated aspirin 325 mg daily.  4. Glucophage 500 mg 2 tablets twice daily.  5. Lopid 600 mg twice daily.  6. Lasix 40 mg t.i.d.  7. Potassium chloride 10 mEq daily.  8. Diamox 250 mg daily.  9. Celexa 20 mg 1/2 tablet daily. 10. Toprol XL 50 mg daily. 11. Prilosec 20 mg at bedtime. 12. Vioxx 25 mg daily. 13. She is also to resume her regular vitamins she had been taking. 14. Insulin:  Humulin 70/30 units subcu in the morning, Humulin N 30 units     subcu in the evening, and Humalog 20 units subcu at dinner.  DISCHARGE ACTIVITIES:  Ambulation as tolerated.  She is asked not to drive for he next four weeks.  DISCHARGE DIET:  Low sodium/low cholesterol  ADA diet.  WOUND CARE:  She may shower daily, keeping her incision clean and dry.  She has an office visit with Dr. Dorris Fetch Wednesday, January 03, 2000, when staples will be removed, and she will see him also at 9:30 in the morning.  BRIEF HISTORY:  [name] is a 60 year old obese white female.  She is status ost coronary artery bypass graft surgery x 3 on June 02, 1999.  She was doing well until on July 26, 1999.  The patient presented to the office of [facility] with clicking and popping sensation in the sternum.  Physical examination demonstrated sternal nonunion.  Chest x-ray, however, showed that all wires were intact and there were no signs of separation of the hemisternum.  She did admit  lifting a 36-pound child about five weeks postoperatively.  She returned November 01, 1999 with the same complaint.  A followup chest x-ray showed all wires to be in good position with no evidence of sternal dehiscence.  A CT was recommended.  This showed good bony position in the upper half of the sternum without definite callous formation.  There was mild displacement of the lower sternum with maximum spread about 3 mm.  The wires were intact but it was suspected that the wires may have pulled through the sternum n the lower portion allowing some displacement.  She was continuing to have pain nd no relief with narcotic medications.  The patient returns to the office December 19, 1999 prior to surgery.  She denies any more clicking or popping, although the pain has not disappeared.  HOSPITAL COURSE:  [name] presented to [facility] December 19, 1999 for rewiring of the sternum by Dr. Charlett Lango.  She tolerated the procedure well; however, just prior to discharge, on postoperative day #3, she developed a left hemiparesis, which has now resolved by postoperative day #6.  She was seen in consultation by Dr. Iran Planas, as dictated above.  CT scan f the brain was negative.  Transcranial Doppler was also negative for abnormality. She has made good progress with resolution of her neurologic deficit.  Her incisions are healing nicely.  There was no evidence of wound breakdown, nor is  there any evidence of "clicking or popping."  The patient has been comfortable n oral analgesia.  She is ambulating independently.  She is taking oral nourishment and has full GI tract function.  She has follow-up with Dr. Dorris Fetch on January 03, 2000 at 9:30 in the morning. DD:  12/25/99 TD:  12/25/99 Job: [id number]

## 2011-05-04 NOTE — Discharge Summary (Signed)
[place]. [facility]  Patient:    [name]                      MRN: [id number] Adm. Date:  [id number] Disc. Date: 12/22/99 Attending:  Charlett Lango Dictator:   Lissa Merlin, P.A.                           Discharge Summary  DATE OF BIRTH:  04-Jun-1951.  CARDIOLOGIST:  Dr. Talena Chalk.  DATE OF ADMISSION:  December 19, 1999.  ANTICIPATED DATE OF DISCHARGE:  December 22, 1999.  ADMISSION DIAGNOSES: 1. Sternal nonunion with resulting chronic pain, status post coronary artery bypass    grafting in June 2000. 2. Coronary artery disease. 3. Congestive heart failure. 4. Hypertension. 5. Asthma. 6. History of anemia. 7. Pseudotumor cerebri. 8. Hypercholesterolemia. 9. Morbid obesity.  DISCHARGE DIAGNOSES: 1. Sternal nonunion with resulting chronic pain, status post coronary artery bypass    grafting in June 2000. 2. Coronary artery disease. 3. Congestive heart failure. 4. Hypertension. 5. Asthma. 6. History of anemia. 7. Pseudotumor cerebri. 8. Hypercholesterolemia. 9. Morbid obesity.  PROCEDURES:  Sternal debridement and rewiring done on December 19, 1999, by Dr. Dorris Fetch.  ALLERGIES:  The patient is allergic to PENICILLIN and its derivatives (throat swelling).  BRIEF ADMISSION HISTORY:  This is a pleasant 60 year old female with multiple medical problems that underwent a CABG x 3 in June 2000.  She did well postoperatively, but developed chronic sternal pain.  The pain interfered with er daily activities.  Physical exam showed instability in the lower half of the sternum where the primary pain was located.  ADMISSION LABORATORY AND X-RAY DATA:  Chest x-ray showed the wires were intact. A CT scan showed nonunion in the lower half of the sternum.  HOSPITAL COURSE:  The patient underwent the procedure as planned on January 2 without complications.  Her postoperative course was significant only for pain,  some nausea, and some  deconditioning.  The patient was put on a PCA pump for pain which was discontinued the evening of January 4.  She was put on p.o. pain medications at that time.  With regards to her deconditioning she has been ambulatory in the hallway with rehab walking slowly with assistance.  She has made slow steady progress.  Her nausea is currently under control.  The patient has otherwise done very well, her heart and lungs have been stable, her vital signs  have been stable, she has been in normal sinus rhythm, her incision has been clean. Vital signs December 21, 1999, showed blood pressure 100/67, pulse sinus tachycardia at 102, respirations at 20, pulse oximetry 93% on room air, and she was afebrile at 98.9.  She is scheduled for follow-up lab work the morning of December 22, 1999, t which time she will have a CBC and a comprehensive metabolic panel.  We will follow up her very slightly increased white count at that time and if her vital signs nd lab work are still satisfactory and the patient is otherwise stable, she will be discharged tomorrow, December 22, 1999.  LABORATORY AND X-RAY DATA:  The latest results from January 3 show a sodium of 35, potassium 4.8, chloride of 104, CO2 of 26, BUN of 27, creatinine of 1.4, and glucose of 125.  Her white count was 11.1 on January 3, hemoglobin is also 11.1, hematocrit 32.5, platelets 297.  A tissue culture from sternum  shows now growth as of December 19, 1999.  DISCHARGE MEDICATIONS:  1. Toprol XL 50 mg tablet one p.o. q.d.  2. Diamox 250 mg tablet one p.o. q.d.  3. Lasix 40 mg tablet one p.o. t.i.d.  4. K-Dur 10 mEq tablet one p.o. q.d.  5. Lopid 600 mg tablet one p.o. b.i.d. a.c.  6. Glucophage 500 mg tablet two p.o. b.i.d. with meals.  7. Insulin 70/30 30 units q.a.m.  8. Humalog 20 units at dinner.  9. Humulin NPH 30 units q.h.s. 10. Prilosec take as before admission. 11. Celexa 20 mg tablet one-half tablet p.o. q.d. 12. Enteric-coated  aspirin 325 mg tablet one p.o. q.d. 13. Vioxx take as before admission. 14. OxyContin 20 mg tablet one p.o. b.i.d. 15. Percocet one p.o. q.4h. p.r.n.  DISCHARGE INSTRUCTIONS:  The patient was instructed to not engage in any strenuous activity or driving.  The patient was told to walk daily.  The patient was told  that he could shower.  The patient was told to keep her wound clean and dry. She was told to use soap and water only on the wound.  DIET:  The patient was told to maintain a diabetic low-fat low-cholesterol low-salt diet.  SPECIAL INSTRUCTIONS:  She was told to get a chest x-ray at [facility] before her appointment with Dr. Dorris Fetch and to bring it with her to hat appointment.  FOLLOWUP: 1. The patient is to follow up with Dr. Dorris Fetch Wednesday, January 03, 2000, at    9:30 a.m. with a chest x-ray from [facility]. 2. The patient is instructed to call Dr. Angelina Pih office to see when she needs o    follow up with him again.  CONDITION ON DISCHARGE:  The patient is currently stable for anticipated discharge Friday, December 22, 1999.  She will be discharged pending satisfactory morning rounds, lab work, physical exam, and vital signs. DD:  12/21/99 TD:  12/22/99 Job: [id number]

## 2011-05-04 NOTE — Procedures (Signed)
[place]. [facility]  Patient:    [name], [name]                     MRN: [id number] Proc. Date: 06/25/00 Adm. Date:  [id number] Attending:  Mingo Amber CC:         Colleen Can. Makinsey Chalk, M.D.                           Procedure Report  PROCEDURES:  Video upper endoscopy and video colonoscopy.  ENDOSCOPIST:  Roosvelt Harps, M.D.  INDICATIONS:  A 60 year old female with reported iron-deficiency anemia  who has been taking aspirin, Celebrex, and Plavix.  In addition, she reports that she had some hematochezia a few days prior to the colonoscopy.  PREPARATION:  She is n.p.o. since midnight, having taken Phospho-Soda prep, and a clear liquid diet.  The mucosa is clean.  PREPROCEDURE SEDATION:  Prior to the upper endoscopy, she received 100 mg of Demerol and 10 mg of Versed.  In addition, she was on 2 L of nasal cannula O2.  DESCRIPTION OF PROCEDURE:  After anesthetizing her throat with Hurricaine spray, the Olympus video upper endoscope was inserted via the mouth and advanced easily to the upper esophageal sphincter.  Intubation was then carried out to the descending duodenum.  On withdrawal, the mucosa was carefully evaluated.  The descending duodenum and bulb appeared completely normal.  There were a few mild erythematous streaks in the antrum of the stomach and a single erosion.  Retroflex view of the GE junction and withdrawal through the entire esophagus was normal.  The patient tolerated this procedure well.  At its conclusion, her position was reversed for video colonoscopy.  The Olympus video colonoscope was inserted via the rectum and advanced easily through the entire colon to the cecum.  Cecal landmarks were identified and photographed.  There were a few pill fragments in the cecum.  On withdrawal, the mucosa was carefully evaluated and found to be entirely normal from the cecum to rectum.  Retroflexed view of the rectum demonstrated  grade 1 internal hemorrhoids.  There were no areas of neoplasia, polyposis, active bleeding, or diverticulosis seen.  The patient tolerated the procedures well.  Pulse, blood pressure, and oximetry testing were stable throughout.  She was observed in recovery for 45 minutes and discharged home alert with a benign abdomen.  She may resume all of her medications.  In light of the mild erosive antritis in the face of aspirin and Celebrex, the patient could be treated with Prilosec which I will prescribe for one month course of therapy.  If her indices truly indicate iron-deficiency, she should be placed on iron therapy.  I do not necessarily need to follow up this patient in the office, but will be glad to do so.  If she has worsening anemia or any other symptoms, consideration could be given to a small bowel series.  CLOtest was checked of the stomach and the patient will be notified if it is positive. DD:  06/25/00 TD:  06/25/00 Job: [id number]

## 2011-05-04 NOTE — H&P (Signed)
[name], [name] NO.:  [id number]   MEDICAL RECORD NO.:  [id number]          PATIENT TYPE:  EMS   LOCATION:  MAJO                         FACILITY:  [facility]   PHYSICIAN:  Hollice Espy, M.D.DATE OF BIRTH:  30-Mar-1951   DATE OF ADMISSION:  06/07/2006  DATE OF DISCHARGE:                                HISTORY & PHYSICAL   PRIMARY CARE PHYSICIAN:  Sigmund Hazel, M.D.   CHIEF COMPLAINT:  Tremors and pain.   HISTORY OF PRESENT ILLNESS:  The patient is a 60 year old white female with  past medical history of chronic pain secondary to herniated discs in the  thoracic spine, diabetes with neuropathy, and history of gastric bypass with  chronic hypocalcemia.  The patient tells me that she has normally been on  Neurontin 200 mg p.o. b.i.d., but for the last several days has had some  problems with tremors.  Went to go see her doctor, who advised her to wean  off of the Neurontin, but today she has had so much severe pain in her lower  back radiating down her legs, as well as severe tremors, that she could not  take it anymore and came into the emergency room.  Her pain is much higher  than her baseline today.  The patient came in.  Her white count was noted to  be normal.  No shift.  Labs of concern, however, were potassium 2.9, albumin  3, alkaline phosphatase 346, INR 1.3 while the patient is on Coumadin, and a  mild UTI, as well as a calcium of 6.4.  The patient was given a little bit  of IV Ativan for her tremors, as well as replacement of potassium and  calcium in the emergency room.  She started feeling a little bit better, but  is still somewhat shaky.  A Doppler of her lower extremities was done  secondary to concerns of a DVT and her subtherapeutic INR, which was  negative.  Currently, the patient also tells me that she has been so weak in  the last few days that she tried to get to the bathroom and lost control of  her bowel and bladder.  She denies any  headaches, vision changes, dysphagia,  chest pain, palpitations, shortness of breath, wheeze, cough, abdominal  pain, but she does have lower back pain radiating down both of her legs.  No  hematuria or dysuria.  No constipation or diarrhea, but she has chronic  neuropathy.   REVIEW OF SYSTEMS:  Otherwise negative.   PAST MEDICAL HISTORY:  1.  Diabetes mellitus.  2.  Peripheral neuropathy.  3.  Chronic back pain secondary to herniated disc, mostly in the thoracic      spine.  4.  Diabetes mellitus.  5.  History of gastric bypass.  6.  History of hypocalcemia.   MEDICATIONS:  1.  Actos 45.  2.  Ultram ES.  3.  Lidoderm patch 5% topically q.12 h.  4.  Requip 2 mg at bedtime.  5.  Coumadin 5 mg p.o. at bedtime.  6.  Cymbalta 60 p.o. daily.  7.  Neurontin, which is a tapering dose, 100 tonight, 100 in the morning,      and then finish.   ALLERGIES:  The patient is allergic to PENICILLIN.   SOCIAL HISTORY:  She denies any tobacco, alcohol, or drug use.   FAMILY HISTORY:  Noncontributory.   PHYSICAL EXAMINATION:  VITAL SIGNS:  Temperature 97.6, heart rate 82, blood  pressure 108/72.  Following administration of some fluids, increased to  140/79.  Respirations 20, O2 saturation 95% on room air.  GENERAL:  The patient is alert and oriented x3.  Currently, in some very  minimal distress secondary to pain.  HEENT:  Normocephalic, atraumatic.  Her mucous membranes are slightly dry.  NECK:  She has no carotid bruits.  HEART:  Regular rate and rhythm.  S1, S2.  LUNGS:  Clear to auscultation bilaterally.  ABDOMEN:  Soft, nontender, nondistended.  Positive bowel sounds.  EXTREMITIES:  Show no clubbing, cyanosis, trace pitting edema.  She has  decreased sensation bilaterally in a stocking-glove fashion on both lower  extremities.   LABORATORY WORK:  White count 7.3, H&H 11.1 and 33, MCV 89, platelet count  241, no shift.  Sodium 141, potassium 2.9, chloride 112, bicarbonate 22, BUN   4, creatinine 0.6, glucose 135.  LFTs noted for an albumin of 3.  Calcium is  low at 6.9.  PT 16.5, INR 1.3, subtherapeutic.  UA shows a mild UTI, with  moderate leukocyte esterase, 7-10 white cells, 0-2 red cells, rare bacteria.  BNP less than 30.   ASSESSMENT AND PLAN:  1.  Hypocalcemia.  Likely secondary to gastric bypass.  I question if this      could be a cause of her tremors.  Will replace with IV amps of calcium.      Recheck in the morning.  2.  Tremors.  This may be possible withdrawal from Neurontin, possible      hypocalcemia, or other.  Will need to further assess.  Treat with p.r.n.      Ativan.  3.  Mild urinary tract infection.  Treat with IV Cipro.  4.  History of lower back pain with herniated discs, and now reports      questionable bowel and bladder incontinence, although this may be more      secondary to weakness.  Will go ahead and check MRI of the patient's      lumbar spine, as she had one scheduled for tomorrow as an outpatient.  5.  Depression.  Continue Cymbalta.  6.  History of cerebrovascular accident.  Subtherapeutic on Coumadin.      Replace and follow.  7.  Peripheral neuropathy.  Continue Requip.  Taper off Neurontin.  8.  Diabetes mellitus.  Continue Actos plus sliding scale.      Hollice Espy, M.D.  Electronically Signed     SKK/MEDQ  D:  06/07/2006  T:  06/07/2006  Job:  [id number]

## 2011-05-04 NOTE — Consult Note (Signed)
[name], Paula NO.:  [id number]   MEDICAL RECORD NO.:  [id number]          PATIENT TYPE:  OBV   LOCATION:  1409                         FACILITY:  [facility]   PHYSICIAN:  Graylin Shiver, M.D.   DATE OF BIRTH:  [birth year]   DATE OF CONSULTATION:  06/21/2005  DATE OF DISCHARGE:                                   CONSULTATION   REASON FOR CONSULTATION:  The patient is a 60 year old female who underwent  surgery yesterday for kidney stones.  GI is being consulted because of  elevated liver enzymes.  In reviewing records on June 18, 2005 her total  bilirubin was 0.6, alkaline phosphatase 293, ALT 25, AST 21.  She was not  reporting any right upper quadrant abdominal pain and she has had her  gallbladder out in the past but did not have gallstones.  She states that  this was taken out as a matter of routine when she had her gastric bypass  surgery in 2002.  On June 20, 2005, her alkaline phosphatase was 293, AST 21,  ALT 25.  On June 21, 2005, she underwent her surgery.  She received  anesthesia beginning at around 11 a.m.  Postoperatively further labs were  drawn at 1:21 p.m. and at this time the alkaline phosphatase was 266, AST  124, ALT 47.  She denies any complaints of right upper quadrant abdominal  pain.  She denies jaundice.  She denies a history of liver disease to her  knowledge.   PAST MEDICAL HISTORY:  Significant for coronary artery disease, diabetes,  history of stroke, history of chronic atrial fibrillation for which she was  taking Coumadin.  This was stopped for several days prior to surgery.   CURRENT MEDICATIONS:  Coumadin.  Lovastatin.  Actos.  Cymbalta.  Requip.  Calcium.  Citracal.   PAST SURGICAL HISTORY:  Three-vessel coronary artery bypass graft in 1995.  Gastric bypass surgery in 2002.  Accompanied with this was a  cholecystectomy.  Shoulder surgery.  Carotid artery surgery.  Hysterectomy.   SOCIAL HISTORY:  She does not smoke or drink  alcohol.   REVIEW OF SYSTEMS:  She currently is feeling well postoperatively.  Denies  any chest pain or shortness of breath.   PHYSICAL EXAMINATION:  GENERAL:  She does not appear in any acute distress.  She is nonicteric.  HEART:  Irregular.  LUNG:  Clear.  ABDOMEN:  Soft, nontender.   IMPRESSION:  Elevated liver enzymes as described above.  She did have some  elevation of the alkaline phosphatase prior to surgery, however, there has  been an increase in the transaminase level since the anesthesia and  sedation.   PLAN:  Obtain a hepatitis profile, ceruloplasmin level, serum iron percent  saturation, and an abdominal ultrasound with attention to the liver.  She  may have fatty liver in light of her history of obesity status post gastric  bypass.  I will also repeat her transaminase level tomorrow.  I suspect that  this will resolve spontaneously over time but we will follow her clinically.       SFG/MEDQ  D:  06/22/2005  T:  06/22/2005  Job:  [id number]   cc:   Dr. Waynetta Sandy N. Morgana Chalk, M.D.  Fax: [phone]

## 2011-05-08 ENCOUNTER — Other Ambulatory Visit: Payer: Self-pay | Admitting: Cardiology

## 2011-05-10 ENCOUNTER — Telehealth: Payer: Self-pay | Admitting: Cardiology

## 2011-05-10 ENCOUNTER — Encounter (HOSPITAL_BASED_OUTPATIENT_CLINIC_OR_DEPARTMENT_OTHER)
Admission: RE | Admit: 2011-05-10 | Discharge: 2011-05-10 | Disposition: A | Discharge status: Home / Self Care | Payer: Federal, State, Local not specified - PPO | Source: Ambulatory Visit | Attending: Orthopedic Surgery | Admitting: Orthopedic Surgery

## 2011-05-10 LAB — BASIC METABOLIC PANEL
Chloride: 104 mEq/L (ref 96–112)
Creatinine, Ser: 0.73 mg/dL (ref 0.4–1.2)
GFR calc Af Amer: >60 mL/min (ref >60)
Sodium: 137 mEq/L (ref 135–145)

## 2011-05-10 NOTE — Telephone Encounter (Signed)
[facility] REQUESTING, LAST OFFICE NOTE ,STESS, EKG, NUCLEAR FAX [phone] SURGERY 5/25

## 2011-05-11 ENCOUNTER — Other Ambulatory Visit: Payer: Self-pay | Admitting: Orthopedic Surgery

## 2011-05-11 ENCOUNTER — Ambulatory Visit (HOSPITAL_BASED_OUTPATIENT_CLINIC_OR_DEPARTMENT_OTHER)
Admission: RE | Admit: 2011-05-11 | Discharge: 2011-05-12 | Disposition: A | Discharge status: Home / Self Care | Payer: Federal, State, Local not specified - PPO | Source: Ambulatory Visit | Attending: Orthopedic Surgery | Admitting: Orthopedic Surgery

## 2011-05-11 DIAGNOSIS — M129 Arthropathy, unspecified: Secondary | ICD-10-CM | POA: Insufficient documentation

## 2011-05-11 DIAGNOSIS — E119 Type 2 diabetes mellitus without complications: Secondary | ICD-10-CM | POA: Insufficient documentation

## 2011-05-11 DIAGNOSIS — Z01810 Encounter for preprocedural cardiovascular examination: Secondary | ICD-10-CM | POA: Insufficient documentation

## 2011-05-11 DIAGNOSIS — I2510 Atherosclerotic heart disease of native coronary artery without angina pectoris: Secondary | ICD-10-CM | POA: Insufficient documentation

## 2011-05-11 DIAGNOSIS — M6520 Calcific tendinitis, unspecified site: Secondary | ICD-10-CM | POA: Insufficient documentation

## 2011-05-11 DIAGNOSIS — M928 Other specified juvenile osteochondrosis: Secondary | ICD-10-CM | POA: Insufficient documentation

## 2011-05-11 DIAGNOSIS — M6530 Trigger finger, unspecified finger: Secondary | ICD-10-CM | POA: Insufficient documentation

## 2011-05-11 DIAGNOSIS — I10 Essential (primary) hypertension: Secondary | ICD-10-CM | POA: Insufficient documentation

## 2011-05-11 DIAGNOSIS — D2370 Other benign neoplasm of skin of unspecified lower limb, including hip: Secondary | ICD-10-CM | POA: Insufficient documentation

## 2011-05-11 DIAGNOSIS — Z01812 Encounter for preprocedural laboratory examination: Secondary | ICD-10-CM | POA: Insufficient documentation

## 2011-05-11 LAB — POCT HEMOGLOBIN-HEMACUE: Hemoglobin: 11.2 g/dL — ABNORMAL LOW (ref 12.0–15.0)

## 2011-05-12 NOTE — Discharge Summary (Signed)
[name], [name]              ACCOUNT NO.:  [id number]  MEDICAL RECORD NO.:  [id number]           PATIENT TYPE:  I  LOCATION:  6702                         FACILITY:  [facility]  PHYSICIAN:  Isidor Holts, M.D.  DATE OF BIRTH:  03-13-1951  DATE OF ADMISSION:  04/24/2011 DATE OF DISCHARGE:  04/27/2011                              DISCHARGE SUMMARY   PRIMARY MD:  Paula Saupe, MD, [facility].  PRIMARY CARDIOLOGIST:  [name]. Paula Chalk, MD  DISCHARGE DIAGNOSES: 1. Acute gastroenteritis, likely viral etiology.  Clostridium     difficile PCR negative. 2  Hypokalemia, secondary to acute gastroenteritis. 1. Previous history of morbid obesity, status post gastric bypass. 2. Chronic diarrhea, secondary to above. 3. History of chronic back pain/opiate dependence. 4. History of diastolic congestive heart failure, ejection fraction     68%. 5. Coronary artery disease/ischemic cardiomyopathy. 6. Peripheral vascular disease, status post bilateral carotid stenosis. 7. Status post previous cerebrovascular accident. 8. History of supraventricular tachycardia, status post ablation. 9. Anxiety/depression. 10.History of pseudotumor cerebri/chronic headaches. 11.Diet-controlled type 2 diabetes mellitus.  DISCHARGE MEDICATIONS: 1. Loperamide 2 mg p.o. p.r.n. t.i.d. for diarrhea. 2. Calcium citrate 2 tablets p.o. daily. 3. Cymbalta 60 mg p.o. daily. 4. Furosemide 20 mg p.o. p.r.n. daily for edema. 5. Isosorbide mononitrate XR 30 mg p.o. daily. 6. Lumigan ophthalmic solution one drop each eye at bedtime. 7. MSIR 15 mg p.o. p.r.n. q.6 hourly for pain. 8. Multivitamin 3 tablets p.o. daily. 9. Protonix 40 mg p.o. daily. 10.Tylenol Extra Strength 1000 mg p.o. p.r.n. q.6 hourly for pain. 11.Vitamin D2 50,000 units weekly on Saturdays. 12.K-Dur 20 mEq p.o. daily.  PROCEDURES:  Abdominal/pelvic CT scan on Apr 24, 2011, this showed postoperative changes from gastric bypass and  cholecystectomy, fatty infiltration of the liver, no acute findings.  CONSULTATIONS:  None.  ADMISSION HISTORY:  As in the H and P notes of Apr 24, 2011, dictated by Dr. Lonia Sloan.  However, in brief this is a 60 year old female, with known history of diet-controlled type 2 diabetes mellitus, previous morbid obesity status post gastric bypass surgery remotely, history of chronic diarrhea and occasional episodes of vomiting, diastolic congestive heart failure, depression and anxiety, chronic low back pain, bilateral carotid stenosis, previous history of CVA, status post kidney stone extraction in 2006, recurrent UTIs, retinopathy, peripheral neuropathy, pseudotumor cerebri, SVT status post ablation in July 1997, history of childhood asthma, status post appendectomy, status post ventral hernia repair, status post vaginal hysterectomy, presenting with nausea, vomiting, and diarrhea of approximately 2 days' duration that was described as watery.  On detailed questioning, it appears that the patient ate several times in different locations in the recent few days and has been on recent antibiotic therapy.  She does have a prior history of C. difficile colitis.  She was subsequently admitted for further evaluation, investigation, and management.  CLINICAL COURSE: 1. Acute gastroenteritis.  The patient presented as described above.     Stool study sent for C. difficile PCR was negative.     Abdominal/pelvic CT scan was negative for acute pathology.  The     patient was managed  with bowel rest, intravenous fluid hydration,     proton pump inhibitor, antiemetics.  Clinical response was     satisfactory.  By Apr 25, 2011, the patient was able to tolerate     liquid diet, which we gradually advanced, and by Apr 26, 2011, she     was managing regular diet without any problems whatsoever.  Because     of negative stool studies, we felt comfortable commencing the     patient on loperamide for  symptom control. Clinical effect was     satisfactory, and by Apr 27, 2011, the patient was passing formed     stool, as this had markedly reduced in frequency.  Likely, the     patient has a viral acute gastroenteritis, which is predictably     transient and self-limited.  2. Hypokalemia.  The patient was plagued by hypokalemia during this     hospitalization.  This was readily addressed with appropriate     supplementation.  3. History of coronary artery disease.  There were no problems     referable to this.  The patient has a known history of ischemic     cardiomyopathy.  She showed no clinical evidence of congestive     heart failure during the course of this hospitalization.  4. Diet-controlled type 2 diabetes mellitus.  The patient remained     euglycemic on carbohydrate-modified diet during the course of this     hospitalization.  5. Chronic back pain.  This did not prove problematic.  The patient     was managed with preadmission opiate medications, as well as     Tylenol.  6. History of anxiety/depression.  The patient's mood remained stable.  DISPOSITION:  The patient was on Apr 27, 2011, asymptomatic.  There were no new issues.  She was considered clinically stable for discharge and therefore discharged accordingly.  The patient did indicate during our discussions, that her primary MD, Dr. Jillyn Sloan, has been considering possible gastric emptying study, given her problems with intermittent vomiting which she states has been going on for approximately 1 year.  Given the fact that her diabetes mellitus appears well controlled, it is doubtful that she actually does have gastroparesis; however, she does have a known history of peripheral neuropathy.  We feel therefore, that this study may be worthwhile doing, although given the fact that the patient is allergic to [facility], should this prove to be a positive study, options will be limited.  The patient also has a history of  chronic diarrhea, which may be worsened by introduction of a prokinetic.  We feel that this is a study that Sloan be carried out on an outpatient basis.  We have therefore recommended to the patient's primary MD, Dr. Jillyn Sloan, to arrange outpatient gastric emptying study.  The patient was discharged on Apr 27, 2011.  ACTIVITY:  As tolerated.  Recommended to increase activity slowly.  DIET:  Heart healthy/carbohydrate modified.  FOLLOWUP INSTRUCTIONS:  The patient is to follow up with her primary MD, Dr. Nunzio Cory at [facility], next week.  She is instructed to call for an appointment.  In addition, she is to follow up with her primary cardiologist, Dr. Roger Shelter, per prior scheduled appointment.  SPECIAL INSTRUCTIONS:  Dr. Jillyn Sloan is recommended to arrange gastric emptying study at her own discretion.  All this has been communicated to the patient and she has verbalized understanding.     Isidor Holts, M.D.     CO/MEDQ  D:  04/27/2011  T:  04/28/2011  Job:  [id number]  cc:   Paula Saupe, MD [name]. Donalee Sloan, M.D.  Electronically Signed by Isidor Holts M.D. on 05/12/2011 06:20:34 PM

## 2011-05-17 NOTE — H&P (Signed)
NAMEALANNIE, [name]              ACCOUNT NO.:  [id number]  MEDICAL RECORD NO.:  [id number]           PATIENT TYPE:  E  LOCATION:  [facility]                         FACILITY:  [facility]  PHYSICIAN:  Lonia Blood, M.D.      DATE OF BIRTH:  [birth year]  DATE OF ADMISSION:  04/24/2011 DATE OF DISCHARGE:                             HISTORY & PHYSICAL   PRIMARY CARE PHYSICIAN:  [facility].  PRESENTING COMPLAINT:  Nausea, vomiting, and diarrhea.  HISTORY OF PRESENT ILLNESS:  The patient is a 60 year old female with history of gastric bypass surgery many years ago who has had on and off diarrhea since her surgery.  The patient has been doing all right until this past 2 days when she started having nausea and vomiting, it got worse to include watery diarrhea.  The patient states her diarrhea at this time is nothing compared to her usual diarrhea.  This is more pure water.  She was also vomiting a lot of initially clear, but the later greenish emesis.  Abdominal pain was also there, but only about 2/10, mainly discomfort.  She has been unable to keep anything down today, so she came to the emergency room.  She denied any hematemesis.  No melena. No bright red blood per rectum.  Here in the ED, her diarrhea has slowed down, it is still there but not as bad as it was yesterday.  PAST MEDICAL HISTORY:  Significant for CHF, history of depression and anxiety, chronic back pain, history of bilateral carotid artery stenosis, history of CVA, diet-controlled diabetes, narcotic dependence, history of kidney stones status post removal back in 2006.  Ischemic cardiomyopathy.  Her last EF was said to be over 68% so it is more diastolic dysfunction than anything.  History of chronic headaches, history of chronic UTIs, history of retinopathy, history of peripheral neuropathy, history of hypocalcemia.  Morbid obesity in the past, but she had gastric bypass surgery, since she has been doing fine.   History of pseudomotor cerebri, history of SVT.  She is status post ablation July 1997.  Childhood history of asthma, status post appendectomy, status post ventral hernia repair, status post vaginal hysterectomy.  ALLERGIES:  She is allergic to NEURONTIN, PENICILLIN, PHENERGAN, and REGLAN.  CURRENT MEDICATIONS: 1. Calcium citrate 1-2 tablets daily. 2. Multivitamin 3 tablets daily. 3. Lasix 20 mg daily as needed. 4. Vitamin D2 50,000 units 1 capsule weekly. 5. Tylenol Extra Strength 500 mg 2 tablets q.6 h. p.r.n. 6. MS Contin 50 mg 1 tablet daily twice daily. 7. Cymbalta 60 mg daily. 8. Protonix 40 mg daily. 9. Lumigan eye drops 1 drop in both eyes at bedtime. 10.Isosorbide mononitrate XL 30 mg daily.  SOCIAL HISTORY:  The patient lives here in [place].  She denied any tobacco, alcohol, or IV drug use.  FAMILY HISTORY:  Positive for diabetes, MI, hypertension, and asthma.  REVIEW OF SYSTEMS:  All system reviewed are negative except per HPI.  PHYSICAL EXAMINATION:  VITAL SIGNS:  Temperature 101.1.  Her blood pressure 124/82, pulse 70, respiratory rate 18, sats 98% on room air. GENERAL:  She is awake, alert,  oriented.  She is in no acute distress. HEENT.  PERRL.  EOMI.  She has dry mucous membrane, but no rhinorrhea. No pharyngitis. NECK:  Supple with bilateral carotid bruits.  No JVD.  No lymphadenopathy. RESPIRATORY:  Good air entry bilaterally.  No wheezes.  No rales. CARDIOVASCULAR:  S1 and S2.  No audible murmur. ABDOMEN:  Soft, full with some diffuse discomfort, but nontender with positive bowel sound.  No organomegaly. EXTREMITIES:  No edema, cyanosis, or clubbing. SKIN:  No visible rashes, mainly dry coarse skin. MUSCULOSKELETAL:  No significant joint involvement.  No swelling.  No tenderness.  LABORATORY DATA:  Her potassium is 2.5.  Sodium 143, chloride 110, CO2 of 24, glucose 97, BUN 6, creatinine 0.81, total bilirubin 0.3, alkaline phosphatase 244, AST 40, ALT  11, total protein 5.6 with albumin 2.5. Her calcium level is 7.8, so corrected is within normal range.  Lipase is 25.  White count is 7.4, hemoglobin 12.1, platelet count of 195 with normal differentials.  Urinalysis showed clean urine with mainly trace blood.  Urine microscopy is negative.  Clostridium difficile stool assay PCR is negative.  CT abdomen and pelvis showed postoperative changes from gastric bypass and cholecystectomy, fatty infiltration of the liver.  Otherwise, no acute findings.  ASSESSMENT: 1. This is a 60 year old female with known history of chronic     diarrhea, presenting with nausea, vomiting, and diarrhea.  Per     patient, she ate out several times in different locations.  She had     mild fever, but no white count.  I suspect this could be viral     gastroenteritis; however, the patient had prior history of C. diff     colitis over 8 months ago.  Her PCR currently is negative making it     less likely, but due to her past history and the fact that she has     been on Cipro antibiotics recently over 3 weeks ago, I am more     inclined towards empirically treating her by repeating PCR assay in     the morning.  If that comes back as negative, then I will stop all     antibiotics and presume this is not C. diff.  In the meantime, we     will continue supportive care, continue with Zofran, plus/minus     Phenergan, IV hydration. 2. History of congestive heart failure.  Her ejection fraction was     normal back last year at 68%, so she should be able to handle the     fluids. 3. Diet-controlled diabetes.  We will watch her sugar closely and use     sliding scale if we have to.  She used to be on metformin, but     since her surgery, she has not really had problem with her sugar. 4. Severe hypokalemia.  The patient will replete both IV and p.o. as     needed. 5. Moderate protein-calorie malnutrition probably from the diarrhea. 6. Narcotic dependence.  The patient  has chronic back pain that she     takes narcotics.  We will make sure she does not go into     withdrawal, which will make her condition even worse.    Lonia Blood, M.D.    Verlin Grills  D:  04/24/2011  T:  04/24/2011  Job:  [id number]  Electronically Signed by Lonia Blood M.D. on 05/17/2011 11:44:52 AM

## 2011-05-17 NOTE — Op Note (Signed)
NAMETWYLIA, OKA              ACCOUNT NO.:  [id number]  MEDICAL RECORD NO.:  [id number]           PATIENT TYPE:  LOCATION:                                 FACILITY:  PHYSICIAN:  Harvie Junior, M.D.   DATE OF BIRTH:  [birth year]  DATE OF PROCEDURE:  05/11/2011 DATE OF DISCHARGE:                              OPERATIVE REPORT   PREOPERATIVE DIAGNOSES: 1. Trigger finger, left. 2. Mass, left posterior calf. 3. Haglund deformity with calcified Achilles tendonitis.  POSTOPERATIVE DIAGNOSES: 1. Trigger finger, left. 2. Mass, left posterior calf. 3. Haglund deformity with calcified Achilles tendonitis.  PROCEDURE: 1. Trigger finger release, left ring. 2. Excision of mass with a sort of an extensile exposure with wide     margins, left calf. 3. Excision of Haglund deformity in essence a partial calcaneal     exostectomy. 4. Tenolysis of the Achilles tendon with debridement of Achilles     tendon in the coronal plane. 5. Intraoperative interpretation of multiple fluoroscopic images.  SURGEON:  Harvie Junior, MD.  ASSISTANT:  Marshia Ly, PA.  ANESTHESIA:  General.  BRIEF HISTORY:  Ms. Deshpande is a 60 year old female with long history of having multiple orthopedic problems, had a trigger finger in the left ring finger with overlying slight amount of Dupuytren's tissue.  There was no contracture.  We injected her with relief, but her pain recurred and triggering recurred.  Because of continued complaints of pain, she was ultimately elected to go to the operating room for trigger finger release.  She also noted to have a large painful mass on the posterior aspect of her left calf, which initially thought to be somewhat benign, but when we rolled her to get much better look at this mass, it seem to be more concerning.  She had Achilles tendonitis, painful Achilles tendon, which had failed conservative care, stretching, ultrasound, activity modification.  Because of  continued complaints of pain, she was taken here for excision of Haglund deformity and debridement of Achilles tendon.  DESCRIPTION OF PROCEDURE:  The patient was taken to the operating room. After adequate anesthesia was obtained with a general anesthetic, the patient was placed supine on the operating table.  Attention was turned to the left arm, where after routine prep and drape, the arm was exsanguinated and blood pressure tourniquet to 250 mmHg.  An angled incision was made over the fourth finger given that she had some Dupuytren's tissue.  We did not want to excise that but felt that in the future she may need some sort of burner style incision in the hand, so we ultimately elected to make the angle limb of the incision and then did the trigger finger release through that.  The A1 pulley was clearly identified and then released, care being taken to retract all the digital nerves out of the way.  Once that was incised, the right angle retractor was used to pull the tendons into the wound and the tendons were pulled into the wound.  At this point, the wound was irrigated, suctioned dry.  Skin closed in layers.  Sterile compressive dressing was applied and  the patient was taken to recovery room and was noted to be in satisfactory condition.  Estimated blood loss for this procedure was none.  At this point, the scrub was broken and the patient was placed onto a stretcher and then rolled into the prone position.  The tourniquet was applied to the left leg prior to this.  At this point, we prepped and draped the left leg in the usual sterile fashion and following this the mass was palpated over the back of the leg with little bit concerning at this point, it did not seem to be respectable of the skin, it did not seem to be freely mobile, and at that point, became somewhat anxious that this could be something more than just a benign lipoma which we thought potentially going into the  surgery.  At that point, we elected to do a wide excision and a wide excision was made with margins of about 5 mm on all sides and including a deep margin.  This mass was sent to pathology for evaluation.  Attention now was turned towards the distal leg where a curved lateral incision was made just neighboring the adjacent Achilles tendon.  Subcutaneous tissue was dissected down to the level of the Achilles tendon.  We removed the retrocalcaneal bursa.  We identified under fluoro the Haglund deformity which was removed with a saw and a rongeur.  We then irrigated the wound thoroughly and sort of felt the coronal thickness of the tendon.  We debrided some of that away because of the significant size of the tendon.  At this point, the Haglund deformity has been excised.  We took the fluoro out and could identify the Haglund deformity and the calcified Achilles tendonitis.  This was then debrided with a rongeur and once the calcified Achilles tendonitis had been removed and the Haglund deformity had been excised, the wound was irrigated, suctioned dry.  Bone wax was placed over the exposed cancellous bone and the layer between the Achilles tendon and the periosteum was closed laterally. The skin was closed with 0 and 2-0 Vicryl and nylon interrupted stitches.  Sterile compressive dressing was applied and the patient was placed into a Cam walker and then taken to the recovery room where she was noted to be in satisfactory condition.  The estimated blood loss for procedure in the lower extremity was none.     Harvie Junior, M.D.     Ranae Plumber  D:  05/11/2011  T:  05/12/2011  Job:  [id number]  Electronically Signed by Jodi Geralds M.D. on 05/17/2011 08:24:27 AM

## 2011-06-27 ENCOUNTER — Encounter (HOSPITAL_BASED_OUTPATIENT_CLINIC_OR_DEPARTMENT_OTHER): Payer: Federal, State, Local not specified - PPO | Admitting: Hematology and Oncology

## 2011-06-27 ENCOUNTER — Other Ambulatory Visit: Payer: Self-pay | Admitting: Hematology and Oncology

## 2011-06-27 DIAGNOSIS — Z9884 Bariatric surgery status: Secondary | ICD-10-CM

## 2011-06-27 DIAGNOSIS — D509 Iron deficiency anemia, unspecified: Secondary | ICD-10-CM

## 2011-06-27 DIAGNOSIS — E538 Deficiency of other specified B group vitamins: Secondary | ICD-10-CM

## 2011-06-27 LAB — COMPREHENSIVE METABOLIC PANEL
AST: 62 U/L — ABNORMAL HIGH (ref 0–37)
Albumin: 2.5 g/dL — ABNORMAL LOW (ref 3.5–5.2)
BUN: 13 mg/dL (ref 6–23)
Calcium: 7.8 mg/dL — ABNORMAL LOW (ref 8.4–10.5)
Chloride: 105 mEq/L (ref 96–112)
Glucose, Bld: 128 mg/dL — ABNORMAL HIGH (ref 70–99)
Potassium: 3.7 mEq/L (ref 3.5–5.3)

## 2011-06-27 LAB — CBC WITH DIFFERENTIAL/PLATELET
Basophils Absolute: 0 10*3/uL (ref 0.0–0.1)
Eosinophils Absolute: 0 10*3/uL (ref 0.0–0.5)
HGB: 10.6 g/dL — ABNORMAL LOW (ref 11.6–15.9)
NEUT#: 4.3 10*3/uL (ref 1.5–6.5)
RDW: 17.8 % — ABNORMAL HIGH (ref 11.2–14.5)
lymph#: 1.8 10*3/uL (ref 0.9–3.3)

## 2011-06-27 LAB — FERRITIN: Ferritin: 294 ng/mL — ABNORMAL HIGH (ref 10–291)

## 2011-06-27 LAB — VITAMIN B12: Vitamin B-12: 612 pg/mL (ref 211–911)

## 2011-07-04 ENCOUNTER — Encounter (HOSPITAL_BASED_OUTPATIENT_CLINIC_OR_DEPARTMENT_OTHER): Payer: Federal, State, Local not specified - PPO | Admitting: Hematology and Oncology

## 2011-07-04 DIAGNOSIS — Z9884 Bariatric surgery status: Secondary | ICD-10-CM

## 2011-07-04 DIAGNOSIS — E538 Deficiency of other specified B group vitamins: Secondary | ICD-10-CM

## 2011-07-04 DIAGNOSIS — D509 Iron deficiency anemia, unspecified: Secondary | ICD-10-CM

## 2011-07-20 ENCOUNTER — Other Ambulatory Visit: Payer: Self-pay | Admitting: Gastroenterology

## 2011-07-20 DIAGNOSIS — R112 Nausea with vomiting, unspecified: Secondary | ICD-10-CM

## 2011-07-23 ENCOUNTER — Other Ambulatory Visit: Payer: Federal, State, Local not specified - PPO

## 2011-08-13 ENCOUNTER — Other Ambulatory Visit: Payer: Self-pay | Admitting: Gastroenterology

## 2011-08-13 ENCOUNTER — Ambulatory Visit
Admission: RE | Admit: 2011-08-13 | Discharge: 2011-08-13 | Disposition: A | Discharge status: Home / Self Care | Payer: Federal, State, Local not specified - PPO | Source: Ambulatory Visit | Attending: Gastroenterology | Admitting: Gastroenterology

## 2011-08-13 DIAGNOSIS — R945 Abnormal results of liver function studies: Secondary | ICD-10-CM

## 2011-08-13 DIAGNOSIS — R112 Nausea with vomiting, unspecified: Secondary | ICD-10-CM

## 2011-08-22 ENCOUNTER — Ambulatory Visit
Admission: RE | Admit: 2011-08-22 | Discharge: 2011-08-22 | Disposition: A | Discharge status: Home / Self Care | Payer: Federal, State, Local not specified - PPO | Source: Ambulatory Visit | Attending: Gastroenterology | Admitting: Gastroenterology

## 2011-08-22 DIAGNOSIS — R945 Abnormal results of liver function studies: Secondary | ICD-10-CM

## 2011-08-22 MED ORDER — GADOBENATE DIMEGLUMINE 529 MG/ML IV SOLN: 15.0000 mL | Freq: Once | INTRAVENOUS | Status: AC | PRN

## 2011-09-07 LAB — COMPREHENSIVE METABOLIC PANEL
ALT: 25
AST: 31
Albumin: 2.5 — ABNORMAL LOW
CO2: 25
Chloride: 105
Creatinine, Ser: 0.71
GFR calc Af Amer: >60
GFR calc non Af Amer: >60
Potassium: 4.5
Sodium: 135
Total Bilirubin: 0.7

## 2011-09-07 LAB — BASIC METABOLIC PANEL WITH GFR
BUN: 13
CO2: 23
Chloride: 107
GFR calc non Af Amer: >60
Glucose, Bld: 95
Potassium: 4.2
Sodium: 139

## 2011-09-07 LAB — BASIC METABOLIC PANEL
BUN: 5 — ABNORMAL LOW
Calcium: 7.6 — ABNORMAL LOW
Calcium: 8.4
Creatinine, Ser: 0.72
GFR calc Af Amer: >60
GFR calc non Af Amer: >60
Glucose, Bld: 75
Potassium: 4
Sodium: 133 — ABNORMAL LOW

## 2011-09-07 LAB — CBC
HCT: 34.5 — ABNORMAL LOW
Hemoglobin: 10.5 — ABNORMAL LOW
Hemoglobin: 11.3 — ABNORMAL LOW
MCHC: 32.6
MCV: 76.4 — ABNORMAL LOW
Platelets: 179
Platelets: 208
RBC: 4.52
RDW: 26.4 — ABNORMAL HIGH
RDW: 27.2 — ABNORMAL HIGH
WBC: 5.4
WBC: 6.1

## 2011-09-07 LAB — DIFFERENTIAL
Basophils Absolute: 0
Basophils Relative: 0
Eosinophils Absolute: 0
Eosinophils Relative: 0
Lymphocytes Relative: 24
Lymphs Abs: 1.5
Monocytes Absolute: 0.5
Monocytes Relative: 9
Neutro Abs: 4.1
Neutrophils Relative %: 67

## 2011-09-07 LAB — PROTIME-INR
INR: 1.2
INR: 1.4
Prothrombin Time: 15.2
Prothrombin Time: 17.4 — ABNORMAL HIGH
Prothrombin Time: 23.8 — ABNORMAL HIGH

## 2011-09-07 LAB — URINALYSIS, ROUTINE W REFLEX MICROSCOPIC
Glucose, UA: NEGATIVE
Hgb urine dipstick: NEGATIVE
Nitrite: POSITIVE — AB
Protein, ur: 30 — AB
Specific Gravity, Urine: 1.026
Urobilinogen, UA: 0.2
pH: 5.5

## 2011-09-07 LAB — URINE MICROSCOPIC-ADD ON

## 2011-09-07 LAB — FECAL LACTOFERRIN, QUANT: Fecal Lactoferrin: NEGATIVE

## 2011-09-07 LAB — URINE CULTURE

## 2011-09-07 LAB — STOOL CULTURE

## 2011-12-14 ENCOUNTER — Telehealth: Payer: Self-pay | Admitting: Hematology and Oncology

## 2011-12-14 NOTE — Telephone Encounter (Signed)
L/M ON MACHINE WITH 1/25 AND 1/29 APPT,ALSO MAILED,AOM   APPTS PER MOSAIQ

## 2012-01-03 ENCOUNTER — Telehealth: Payer: Self-pay | Admitting: Hematology and Oncology

## 2012-01-03 NOTE — Telephone Encounter (Signed)
Returned pt's call and r/s jan appts to march due to pt will be out of town and not returning until 2/26. Dates scheduled per pt.

## 2012-01-11 ENCOUNTER — Other Ambulatory Visit: Payer: Federal, State, Local not specified - PPO | Admitting: Lab

## 2012-01-15 ENCOUNTER — Ambulatory Visit: Payer: Federal, State, Local not specified - PPO | Admitting: Hematology and Oncology

## 2012-02-22 ENCOUNTER — Other Ambulatory Visit (HOSPITAL_BASED_OUTPATIENT_CLINIC_OR_DEPARTMENT_OTHER): Payer: Federal, State, Local not specified - PPO | Admitting: Lab

## 2012-02-22 ENCOUNTER — Ambulatory Visit: Payer: Federal, State, Local not specified - PPO | Admitting: Hematology and Oncology

## 2012-02-22 DIAGNOSIS — E538 Deficiency of other specified B group vitamins: Secondary | ICD-10-CM

## 2012-02-22 DIAGNOSIS — D509 Iron deficiency anemia, unspecified: Secondary | ICD-10-CM

## 2012-02-22 DIAGNOSIS — Z9884 Bariatric surgery status: Secondary | ICD-10-CM

## 2012-02-22 LAB — FERRITIN: Ferritin: 23 ng/mL (ref 10–291)

## 2012-02-22 LAB — CBC WITH DIFFERENTIAL/PLATELET
BASO%: 0.2 % (ref 0.0–2.0)
Basophils Absolute: 0 10*3/uL (ref 0.0–0.1)
EOS%: 1.4 % (ref 0.0–7.0)
Eosinophils Absolute: 0.1 10*3/uL (ref 0.0–0.5)
HCT: 37.6 % (ref 34.8–46.6)
HGB: 12.3 g/dL (ref 11.6–15.9)
LYMPH%: 22.2 % (ref 14.0–49.7)
MCH: 29.7 pg (ref 25.1–34.0)
MCHC: 32.8 g/dL (ref 31.5–36.0)
MCV: 90.7 fL (ref 79.5–101.0)
MONO#: 0.4 10*3/uL (ref 0.1–0.9)
MONO%: 6.1 % (ref 0.0–14.0)
NEUT#: 4.4 10*3/uL (ref 1.5–6.5)
NEUT%: 70.1 % (ref 38.4–76.8)
Platelets: 126 10*3/uL — ABNORMAL LOW (ref 145–400)
RBC: 4.15 10*6/uL (ref 3.70–5.45)
RDW: 14.5 % (ref 11.2–14.5)
WBC: 6.3 10*3/uL (ref 3.9–10.3)
lymph#: 1.4 10*3/uL (ref 0.9–3.3)

## 2012-02-22 LAB — IRON AND TIBC
%SAT: 22 % (ref 20–55)
Iron: 76 ug/dL (ref 42–145)
TIBC: 341 ug/dL (ref 250–470)
UIBC: 265 ug/dL (ref 125–400)

## 2012-02-22 LAB — COMPREHENSIVE METABOLIC PANEL WITH GFR
ALT: 27 U/L (ref 0–35)
AST: 33 U/L (ref 0–37)
Albumin: 3.6 g/dL (ref 3.5–5.2)
Alkaline Phosphatase: 322 U/L — ABNORMAL HIGH (ref 39–117)
BUN: 7 mg/dL (ref 6–23)
CO2: 25 meq/L (ref 19–32)
Calcium: 8.3 mg/dL — ABNORMAL LOW (ref 8.4–10.5)
Chloride: 108 meq/L (ref 96–112)
Creatinine, Ser: 0.64 mg/dL (ref 0.50–1.10)
Glucose, Bld: 220 mg/dL — ABNORMAL HIGH (ref 70–99)
Potassium: 4.2 meq/L (ref 3.5–5.3)
Sodium: 138 meq/L (ref 135–145)
Total Bilirubin: 0.3 mg/dL (ref 0.3–1.2)
Total Protein: 6.3 g/dL (ref 6.0–8.3)

## 2012-02-22 LAB — VITAMIN B12: Vitamin B-12: 561 pg/mL (ref 211–911)

## 2012-02-25 ENCOUNTER — Other Ambulatory Visit: Payer: Self-pay | Admitting: *Deleted

## 2012-02-25 ENCOUNTER — Telehealth: Payer: Self-pay | Admitting: *Deleted

## 2012-02-25 NOTE — Telephone Encounter (Signed)
Spoke with pt and gave pt date and time for f/u with md on 02/26/12 at  10 am.   Pt voiced understanding.

## 2012-02-26 ENCOUNTER — Telehealth: Payer: Self-pay | Admitting: *Deleted

## 2012-02-26 ENCOUNTER — Ambulatory Visit: Payer: Federal, State, Local not specified - PPO | Admitting: Hematology and Oncology

## 2012-02-26 NOTE — Telephone Encounter (Signed)
Spoke with pt at home today.   Informed pt that md had faxed pt's lab results to her primary .   Instructed pt to follow up with her primary for further instructions.   Pt voiced understanding.

## 2012-04-08 ENCOUNTER — Encounter: Payer: Self-pay | Admitting: *Deleted

## 2012-05-14 ENCOUNTER — Telehealth: Payer: Self-pay | Admitting: *Deleted

## 2012-05-14 NOTE — Telephone Encounter (Signed)
Rec'd New Patient referral on patient from primary, Dr Cain Saupe, however, patient last seen by Dr Dalene Carrow 06/2011, forwarded new information and office notes to Dr Lonell Face staff to order appts in [facility].

## 2012-05-16 ENCOUNTER — Telehealth: Payer: Self-pay | Admitting: *Deleted

## 2012-05-16 NOTE — Telephone Encounter (Signed)
1600   -    Spoke with pt at home and was informed that pt had lots of stressors going on past few months.    Asked if pt would like to f/u with Dr. Dalene Carrow.   Pt stated  " Yes ".      Informed pt that a scheduler will contact pt with f/u appt as per md's instructions.    Pt voiced understanding.

## 2012-05-16 NOTE — Telephone Encounter (Signed)
Called pt on both home and cell phones unsuccessfully.   Left messages on both voice mail for pt to call nurse back as soon as possible.

## 2012-05-21 ENCOUNTER — Other Ambulatory Visit: Payer: Self-pay | Admitting: *Deleted

## 2012-05-22 ENCOUNTER — Telehealth: Payer: Self-pay | Admitting: *Deleted

## 2012-05-22 ENCOUNTER — Telehealth: Payer: Self-pay | Admitting: Hematology and Oncology

## 2012-05-22 NOTE — Telephone Encounter (Signed)
Per staff message from Rose, I have schedule treatment appts.  JMW  

## 2012-05-22 NOTE — Telephone Encounter (Signed)
Called pt, left message for the upcoming appt in June 2013

## 2012-05-28 ENCOUNTER — Ambulatory Visit (HOSPITAL_BASED_OUTPATIENT_CLINIC_OR_DEPARTMENT_OTHER): Payer: Federal, State, Local not specified - PPO | Admitting: Nurse Practitioner

## 2012-05-28 ENCOUNTER — Other Ambulatory Visit: Payer: Self-pay | Admitting: Hematology and Oncology

## 2012-05-28 ENCOUNTER — Telehealth: Payer: Self-pay | Admitting: Hematology and Oncology

## 2012-05-28 ENCOUNTER — Encounter: Payer: Self-pay | Admitting: Nurse Practitioner

## 2012-05-28 ENCOUNTER — Ambulatory Visit (HOSPITAL_BASED_OUTPATIENT_CLINIC_OR_DEPARTMENT_OTHER): Payer: Federal, State, Local not specified - PPO

## 2012-05-28 VITALS — BP 123/84 | HR 109 | Temp 97.5°F | Wt 150.6 lb

## 2012-05-28 DIAGNOSIS — D509 Iron deficiency anemia, unspecified: Secondary | ICD-10-CM

## 2012-05-28 DIAGNOSIS — K912 Postsurgical malabsorption, not elsewhere classified: Secondary | ICD-10-CM

## 2012-05-28 DIAGNOSIS — D539 Nutritional anemia, unspecified: Secondary | ICD-10-CM

## 2012-05-28 DIAGNOSIS — E538 Deficiency of other specified B group vitamins: Secondary | ICD-10-CM

## 2012-05-28 MED ORDER — SODIUM CHLORIDE 0.9 % IV SOLN
1020.0000 mg | Freq: Once | INTRAVENOUS | Status: AC
  Administered 2012-05-28: 1020 mg via INTRAVENOUS
  Filled 2012-05-28: qty 34

## 2012-05-28 MED ORDER — SODIUM CHLORIDE 0.9 % IV SOLN
Freq: Once | INTRAVENOUS | Status: AC
  Administered 2012-05-28: 16:00:00 via INTRAVENOUS

## 2012-05-28 NOTE — Telephone Encounter (Signed)
appts made and printed for pt aom °

## 2012-05-28 NOTE — Patient Instructions (Signed)
Ferumoxytol injection What is this medicine? FERUMOXYTOL is an iron complex. Iron is used to make healthy red blood cells, which carry oxygen and nutrients throughout the body. This medicine is used to treat iron deficiency anemia in people with chronic kidney disease. This medicine may be used for other purposes; ask your health care provider or pharmacist if you have questions. What should I tell my health care provider before I take this medicine? They need to know if you have any of these conditions: -anemia not caused by low iron levels -high levels of iron in the blood -magnetic resonance imaging (MRI) test scheduled -an unusual or allergic reaction to iron, other medicines, foods, dyes, or preservatives -pregnant or trying to get pregnant -breast-feeding How should I use this medicine? This medicine is for infusion into a vein. It is given by a health care professional in a hospital or clinic setting. Talk to your pediatrician regarding the use of this medicine in children. Special care may be needed. Overdosage: If you think you've taken too much of this medicine contact a poison control center or emergency room at once. Overdosage: If you think you have taken too much of this medicine contact a poison control center or emergency room at once. NOTE: This medicine is only for you. Do not share this medicine with others. What if I miss a dose? It is important not to miss your dose. Call your doctor or health care professional if you are unable to keep an appointment. What may interact with this medicine? This medicine may interact with the following medications: -other iron products This list may not describe all possible interactions. Give your health care provider a list of all the medicines, herbs, non-prescription drugs, or dietary supplements you use. Also tell them if you smoke, drink alcohol, or use illegal drugs. Some items may interact with your medicine. What should I watch  for while using this medicine? Visit your doctor or healthcare professional regularly. Tell your doctor or healthcare professional if your symptoms do not start to get better or if they get worse. You may need blood work done while you are taking this medicine. You may need to follow a special diet. Talk to your doctor. Foods that contain iron include: whole grains/cereals, dried fruits, beans, or peas, leafy green vegetables, and organ meats (liver, kidney). What side effects may I notice from receiving this medicine? Side effects that you should report to your doctor or health care professional as soon as possible: -allergic reactions like skin rash, itching or hives, swelling of the face, lips, or tongue -breathing problems -changes in blood pressure -feeling faint or lightheaded, falls -fever or chills -flushing, sweating, or hot feelings -swelling of the ankles or feet Side effects that usually do not require medical attention (Report these to your doctor or health care professional if they continue or are bothersome.): -diarrhea -headache -nausea, vomiting -stomach pain This list may not describe all possible side effects. Call your doctor for medical advice about side effects. You may report side effects to FDA at 1-800-FDA-1088. Where should I keep my medicine? This drug is given in a hospital or clinic and will not be stored at home. NOTE: This sheet is a summary. It may not cover all possible information. If you have questions about this medicine, talk to your doctor, pharmacist, or health care provider.  2012, Elsevier/Gold Standard. (08/25/2008 9:48:25 PM) 

## 2012-05-28 NOTE — Progress Notes (Signed)
CC: Cain Saupe, MD  Tonita Cong, M.D.   IDENTIFYING STATEMENT:  Natlie Asfour is a 61 year old white female with iron deficiency anemia who presents for followup.  INTERIM HISTORY:  Ms. Thakur reports since her last clinic visit, on July 04, 2011, she has had some intermittent fatigue but is able to complete ADLs.  She has had no fevers, chills, or night sweats.  No dyspnea or cough.  She does report normal appetite.  She is not having any abdominal pain or cramping.  No rectal bleeding.  She does report loose stools but states that that has been her normal pattern, has not really changed over baseline.  She has not had any rectal bleeding.  She has been taking care of her son who has diabetes and has neglected to keep several appointments. She states she is depressed but is being treated with Celexa. She was encouraged to go on daily walks as well to help reduce her stress and depressive symptoms.  CURRENT MEDICATIONS:  Are reviewed and recorded.  She is on Celexa, Prilosec, Morphine for back pain, Tylenol  PHYSICAL EXAMINATION:  Filed Vitals:   05/28/12 1417  BP: 131/67  Pulse: 72  Temp: 98 F (36.7 C)  Weight 160.3 .  General:  This is a well-developed, well-nourished white female in no acute distress.  HEENT:  Sclerae nonicteric.  There is no thrush or mucositis.  Skin:  Reveals multiple ecchymotic areas without lesions. Skin is thin, and slight rash noted to cheeks. Lymph:  No peripheral lymphadenopathy.  Cardiac:  Reveals regular rate and rhythm without murmurs or gallops.  Peripheral pulses are palpable.  Chest:  Lungs clear to auscultation.  Abdomen:  Positive bowel sounds, soft, nontender, and nondistended.  No organomegaly.  Neuro:  Alert and oriented x3.  Strength, sensation, and coordination all grossly intact.  LABORATORY DATA:  Iron  Date/Time Value Range Status  02/22/2012  1:52 PM 76  42 - 145 ug/dL Final   Ferritin  Date/Time Value Range Status  02/22/2012  1:52 PM 23   10 - 291 ng/mL Final   HGB  Date/Time Value Range Status  02/22/2012  1:52 PM 12.3  11.6 - 15.9 g/dL Final     Hemoglobin  Date/Time Value Range Status  05/11/2011 11:28 AM 11.2* 12.0 - 15.0 g/dL Final   HCT  Date/Time Value Range Status  02/22/2012  1:52 PM 37.6  34.8 - 46.6 % Final  04/27/2011  9:02 AM 32.5* 36.0 - 46.0 % Final   Platelets  Date/Time Value Range Status  02/22/2012  1:52 PM 126* 145 - 400 10e3/uL Final  04/27/2011  9:02 AM 159  150 - 400 K/uL Final   WBC  Date/Time Value Range Status  02/22/2012  1:52 PM 6.3  3.9 - 10.3 10e3/uL Final  04/27/2011  9:02 AM 6.6  4.0 - 10.5 K/uL Final   Iron  Date/Time Value Range Status  02/22/2012  1:52 PM 76  42 - 145 ug/dL Final    IMPRESSION/PLAN:   1. 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  She has received intravenous iron in the past in the form of INFeD.  She currently remains on oral B12.  Her ferritin level is low and she will receive Feraheme today. 2. The patient will be scheduled for followup visit with Dr. Dalene Carrow in 6 months' time.  A few days before this  we will reassess CBC with diff, CMET, ferritin, iron, IBC, and B12 levels.  She is instructed to call in the interim if any questions or problems. 3. The patient has depression and is on Celexa, and voiced feelings of being overwhelmed today. 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 She has missed multiple appts in the past. Emotional support offered - patient receptive to staff support. She knows to call us prior to next office visit if we can be of assistance.   ______________________________ Bobbe Medico, AOCNP, NP-C, ANP, MSN

## 2012-06-11 ENCOUNTER — Encounter: Payer: Self-pay | Admitting: Cardiology

## 2012-07-04 ENCOUNTER — Encounter: Payer: Self-pay | Admitting: Cardiology

## 2012-11-20 ENCOUNTER — Other Ambulatory Visit: Payer: Federal, State, Local not specified - PPO | Admitting: Lab

## 2012-11-24 ENCOUNTER — Other Ambulatory Visit (HOSPITAL_BASED_OUTPATIENT_CLINIC_OR_DEPARTMENT_OTHER): Payer: Federal, State, Local not specified - PPO | Admitting: Lab

## 2012-11-24 DIAGNOSIS — D509 Iron deficiency anemia, unspecified: Secondary | ICD-10-CM

## 2012-11-24 LAB — CBC WITH DIFFERENTIAL/PLATELET
Eosinophils Absolute: 0.1 10*3/uL (ref 0.0–0.5)
MONO#: 0.4 10*3/uL (ref 0.1–0.9)
NEUT#: 3.8 10*3/uL (ref 1.5–6.5)
Platelets: 121 10*3/uL — ABNORMAL LOW (ref 145–400)
RBC: 3.93 10*6/uL (ref 3.70–5.45)
RDW: 13 % (ref 11.2–14.5)
WBC: 6.1 10*3/uL (ref 3.9–10.3)
lymph#: 1.8 10*3/uL (ref 0.9–3.3)

## 2012-11-24 LAB — COMPREHENSIVE METABOLIC PANEL (CC13)
ALT: 34 U/L (ref 0–55)
AST: 24 U/L (ref 5–34)
Alkaline Phosphatase: 260 U/L — ABNORMAL HIGH (ref 40–150)
CO2: 22 mEq/L (ref 22–29)
Calcium: 7 mg/dL — ABNORMAL LOW (ref 8.4–10.4)
Chloride: 110 mEq/L — ABNORMAL HIGH (ref 98–107)
Glucose: 99 mg/dl (ref 70–99)
Potassium: 4.6 mEq/L (ref 3.5–5.1)
Sodium: 139 mEq/L (ref 136–145)
Total Bilirubin: 1.07 mg/dL (ref 0.20–1.20)

## 2012-11-24 LAB — VITAMIN B12: Vitamin B-12: 398 pg/mL (ref 211–911)

## 2012-11-24 LAB — IRON AND TIBC
%SAT: 39 % (ref 20–55)
TIBC: 298 ug/dL (ref 250–470)
UIBC: 182 ug/dL (ref 125–400)

## 2012-11-26 ENCOUNTER — Encounter: Payer: Self-pay | Admitting: Hematology and Oncology

## 2012-11-26 ENCOUNTER — Telehealth: Payer: Self-pay | Admitting: Hematology and Oncology

## 2012-11-26 ENCOUNTER — Ambulatory Visit (HOSPITAL_BASED_OUTPATIENT_CLINIC_OR_DEPARTMENT_OTHER): Payer: Federal, State, Local not specified - PPO | Admitting: Hematology and Oncology

## 2012-11-26 VITALS — BP 139/64 | HR 80 | Temp 98.4°F | Resp 20 | Ht 65.0 in | Wt 162.0 lb

## 2012-11-26 DIAGNOSIS — D509 Iron deficiency anemia, unspecified: Secondary | ICD-10-CM

## 2012-11-26 NOTE — Progress Notes (Signed)
This office note has been dictated.

## 2012-11-26 NOTE — Patient Instructions (Addendum)
[name]  [id number]   [facility] - AFTER VISIT SUMMARY   **RECOMMENDATIONS MADE BY THE CONSULTANT AND ANY TEST    RESULTS WILL BE SENT TO YOUR REFERRING DOCTORS.   YOUR EXAM FINDINGS, LABS AND RESULTS WERE DISCUSSED BY YOUR MD TODAY.  YOU CAN GO TO THE [place] WEB SITE FOR INSTRUCTIONS ON HOW TO ASSESS MY CHART FOR ADDITIONAL INFORMATION AS NEEDED.  Your Updated drug allergies are: Allergies as of 11/26/2012 - Review Complete 11/26/2012  Allergen Reaction Noted  . Metoclopramide hcl  04/08/2012  . Neurontin (gabapentin)  04/08/2012  . Penicillins  04/08/2012    Your current list of medications are: Current Outpatient Prescriptions  Medication Sig Dispense Refill  . acetaminophen (TYLENOL) 500 MG tablet Take 500 mg by mouth every 6 (six) hours as needed.      Marland Kitchen aspirin 81 MG tablet Take 81 mg by mouth daily.      . bimatoprost (LUMIGAN) 0.01 % SOLN 1 drop at bedtime.      . Bromfenac Sodium (BROMDAY) 0.09 % SOLN Apply to eye daily.      . cholecalciferol (VITAMIN D) 1000 UNITS tablet Take 1,000 Units by mouth daily.      . Cyanocobalamin (VITAMIN B-12) 1000 MCG SUBL Place under the tongue as needed.      . DULoxetine (CYMBALTA) 60 MG capsule Take 60 mg by mouth daily.      . furosemide (LASIX) 20 MG tablet Take 20 mg by mouth as needed.      . isosorbide mononitrate (IMDUR) 30 MG 24 hr tablet Take 1 tablet (30 mg total) by mouth daily.  30 tablet  6  . morphine (MSIR) 15 MG tablet Take 15 mg by mouth every 6 (six) hours as needed.      . moxifloxacin (VIGAMOX) 0.5 % ophthalmic solution Place 1 drop into the right eye 4 (four) times daily.      . nitroGLYCERIN (NITROSTAT) 0.4 MG SL tablet Place 1 tablet (0.4 mg total) under the tongue every 5 (five) minutes as needed for chest pain.  25 tablet  6  . pantoprazole (PROTONIX) 40 MG tablet Take 40 mg by mouth daily.         INSTRUCTIONS GIVEN AND DISCUSSED:  See attached schedule   SPECIAL  INSTRUCTIONS/FOLLOW-UP:  See above.  I acknowledge that I have been informed and understand all the instructions given to me and received a copy.I know to contact the clinic, my physician, or go to the emergency Department if any problems should occur.   I do not have any more questions at this time, but understand that I may call the [facility] at [phone] during business hours should I have any further questions or need assistance in obtaining follow-up care.

## 2012-11-26 NOTE — Telephone Encounter (Signed)
gv pt appt schedule for April 2014.  °

## 2012-12-15 ENCOUNTER — Telehealth: Payer: Self-pay | Admitting: Oncology

## 2012-12-15 NOTE — Telephone Encounter (Signed)
Former pt of LO re-establishing w/JG. Moved 4/9 appt w/LO to 4/11 w/JG. Called pt several times but line busy and 2nd number not working.

## 2013-02-07 ENCOUNTER — Encounter: Payer: Self-pay | Admitting: Oncology

## 2013-03-23 ENCOUNTER — Other Ambulatory Visit (HOSPITAL_BASED_OUTPATIENT_CLINIC_OR_DEPARTMENT_OTHER): Payer: Federal, State, Local not specified - PPO | Admitting: Lab

## 2013-03-23 DIAGNOSIS — D509 Iron deficiency anemia, unspecified: Secondary | ICD-10-CM

## 2013-03-23 LAB — BASIC METABOLIC PANEL (CC13)
CO2: 23 mEq/L (ref 22–29)
Calcium: 7.1 mg/dL — ABNORMAL LOW (ref 8.4–10.4)
Sodium: 136 mEq/L (ref 136–145)

## 2013-03-23 LAB — CBC WITH DIFFERENTIAL/PLATELET
BASO%: 0.2 % (ref 0.0–2.0)
HCT: 35.4 % (ref 34.8–46.6)
LYMPH%: 25.6 % (ref 14.0–49.7)
MCH: 32.3 pg (ref 25.1–34.0)
MCHC: 33.2 g/dL (ref 31.5–36.0)
MCV: 97.2 fL (ref 79.5–101.0)
MONO#: 0.4 10*3/uL (ref 0.1–0.9)
NEUT%: 67.3 % (ref 38.4–76.8)
Platelets: 143 10*3/uL — ABNORMAL LOW (ref 145–400)
WBC: 6.6 10*3/uL (ref 3.9–10.3)

## 2013-03-23 LAB — FERRITIN: Ferritin: 95 ng/mL (ref 10–291)

## 2013-03-23 LAB — IRON AND TIBC: UIBC: 218 ug/dL (ref 125–400)

## 2013-03-25 ENCOUNTER — Ambulatory Visit: Payer: Federal, State, Local not specified - PPO | Admitting: Hematology and Oncology

## 2013-03-27 ENCOUNTER — Ambulatory Visit: Payer: Federal, State, Local not specified - PPO | Admitting: Oncology

## 2013-03-31 ENCOUNTER — Other Ambulatory Visit: Payer: Self-pay | Admitting: *Deleted

## 2013-03-31 ENCOUNTER — Ambulatory Visit (HOSPITAL_BASED_OUTPATIENT_CLINIC_OR_DEPARTMENT_OTHER): Payer: Federal, State, Local not specified - PPO | Admitting: Oncology

## 2013-03-31 ENCOUNTER — Telehealth: Payer: Self-pay | Admitting: Oncology

## 2013-03-31 ENCOUNTER — Ambulatory Visit (HOSPITAL_BASED_OUTPATIENT_CLINIC_OR_DEPARTMENT_OTHER): Payer: Federal, State, Local not specified - PPO | Admitting: Lab

## 2013-03-31 ENCOUNTER — Encounter: Payer: Self-pay | Admitting: Oncology

## 2013-03-31 VITALS — BP 153/71 | HR 85 | Temp 98.4°F | Resp 19 | Ht 65.0 in | Wt 167.7 lb

## 2013-03-31 DIAGNOSIS — E538 Deficiency of other specified B group vitamins: Secondary | ICD-10-CM

## 2013-03-31 DIAGNOSIS — R6889 Other general symptoms and signs: Secondary | ICD-10-CM

## 2013-03-31 DIAGNOSIS — D509 Iron deficiency anemia, unspecified: Secondary | ICD-10-CM

## 2013-03-31 DIAGNOSIS — Z9884 Bariatric surgery status: Secondary | ICD-10-CM

## 2013-03-31 LAB — TSH: TSH: 0.652 u[IU]/mL (ref 0.350–4.500)

## 2013-03-31 LAB — T3: T3, Total: 92.9 ng/dL (ref 80.0–204.0)

## 2013-03-31 LAB — T4: T4, Total: 7.2 ug/dL (ref 5.0–12.5)

## 2013-03-31 NOTE — Patient Instructions (Signed)
You may want to talk with Dr [name] about IV bisphosphonate for low bone density

## 2013-03-31 NOTE — Telephone Encounter (Signed)
gv and printed pt appt sched and avs for pt. °

## 2013-03-31 NOTE — Progress Notes (Signed)
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 She is on oral B12 and has needed IV iron intermittently, last with Feraheme in June 2013.  She sees PCP Dr [name] q 6 months, last in fall 2013. She sees Dr Royann Shivers yearly, Dr Vear Clock every other month, saw Dr Dulce Sellar recently and has a follow up visit upcoming.   Patient was seen by Dr Dalene Carrow for first time in 07-2004, with long history of iron deficiency anemia. She had history of menorrhagia prior to hysterectomy for this in 1995, and gastric bypass surgery in 2002. At Dr Lonell Face first visit, Hgb was 9.2, MCV 74, serum iron 25. Bone marrow exam 07-24-2004 had trilineage hematopoiesis with absent iron stores. SPEP in 12-2009 had no findings of concern. She had IV iron, including Infed in 01-2009 and feraheme 1020 mg in 05-2012. She has not continued oral iron. She is on oral B12. She is not aware of any bleeding. She has low bone density, thought also related to absorption problems from gastric bypass.   Patient complains of "feeling cold all the time".; she has not had thyroid function tests by PCP in past year per that office. 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 She has had NP cough this week, with more upper back and rib pain with coughing. She has early satiety since gastric bypass, eats small amounts multiple times daily.She lost 250 lbs with the gastric bypass, but has gained some weight recently. Bowels move 5-10 times daily despite no liquids with meals and not improved with imodium. She has significant GERD, just began omeprazole by Dr Dulce Sellar  in past 2 weeks. She "vomits" mucous about 15 min after eating, has just started zofran also by Dr Dulce Sellar. She has overactive bladder, "oxy patch" not helpful. Off DM meds x 6 years, still follows blood sugars at home, highest 130. No HA, wears reading glasses, no cardiac symptoms. No mammograms x years due to painful. Lots of bruising tho I believe no longer on warfarin, as this is not on EMR med list.    Remainder of 10 point Review of Systems negative.  Past Medical/ Surgical History 3 vessel CABG ~ 20 years ago TIA post gastric bypass with left sided weakness x 3 hours, resolved without residual, had been on anticoagulation for CVA (as above, I am not certain that she is still on this) Previous type 2 DM Diabetic retinopathy Gastric bypass 2002 at [facility], with cholecystectomy Hysterectomy for menorrhagia 1995 Surgery for spurs left shoulder -- Dr Jodi Geralds Colonoscopy 2002 70-99% stenosis right proximal ICA and near occlusive stenosis proximal left ICA by [place] 2010. Carotid angiogram 2002 had completely occulded right ICA; left ICA with focal areas of severe stenosis, and extensive collaterals thruout. No mammograms x years   Family History Father alcoholic Mother MI and HTN Brother HTN Sister cardiac disease, DM Son with DM, amputee from complications  Social History Originaly from [place], divorced, lives alone. Son lives locally, daughter in [place]. 3 grandchildren. Worked 30 years with [employer] and [employer], retired. Smoked 2 ppd x 15 years, quit age 57 using set of 4 filters. Has  chihuahua.   Objective:  Vital signs in last 24 hours:  BP 153/71  Pulse 85  Temp(Src) 98.4 F (36.9 C) (Oral)  Resp 19  Ht 5\' 5"  (1.651 m)  Wt 167 lb 11.2 oz (76.068 kg)  BMI 27.91 kg/m2  Pleasant lady looks older than stated age, articulate, good historian. Cough x 1 during lengthy visit. Ambulatory slowly due to back, but without assistance.  HEENT: normal hair pattern. PERRL, not icteric. Poor  dentition. Oral mucosa moist and clear, posterior pharynx with dull erythema, no exudate. No JVD or obvious thyroid mass LymphaticsCervical, supraclavicular, and axillary nodes normal. Resp: diminished BS without wheezes or rales, clear to percussion Cardio: regular rate and rhythm . Median sternotomy scar. Tender to palpation upper spine and mid spine. GI: soft, not tender, few bowel sounds, not distended, no appreciable mass or HSM Midline scar not remarkable Extremities: extremities normal, atraumatic, no cyanosis or edema Neuro: motor, sensory, cerebellar nonfocal Breasts bilaterally without dominant mass, skin or nipple findings. Right breast generally tender medially Skin with multiple ecchymoses UE. No rash.  Lab Results:  Results for orders placed in visit on 03/23/13  CBC WITH DIFFERENTIAL      Result Value Range   WBC 6.6  3.9 - 10.3 10e3/uL   NEUT# 4.4  1.5 - 6.5 10e3/uL   HGB 11.8  11.6 - 15.9 g/dL   HCT 16.1  09.6 - 04.5 %   Platelets 143 (*) 145 - 400 10e3/uL   MCV 97.2  79.5 - 101.0 fL   MCH 32.3  25.1 - 34.0 pg   MCHC 33.2  31.5 - 36.0 g/dL   RBC 4.09 (*) 8.11 - 9.14 10e6/uL   RDW 13.3  11.2 - 14.5 %   lymph# 1.7  0.9 - 3.3 10e3/uL   MONO# 0.4  0.1 - 0.9 10e3/uL   Eosinophils Absolute 0.0  0.0 - 0.5 10e3/uL   Basophils Absolute 0.0  0.0 - 0.1 10e3/uL   NEUT% 67.3  38.4 - 76.8 %   LYMPH% 25.6  14.0 - 49.7 %   MONO% 6.3  0.0 - 14.0 %   EOS% 0.6  0.0 - 7.0 %   BASO% 0.2  0.0 - 2.0 %  VITAMIN B12      Result Value Range   Vitamin B-12 428  211 - 911 pg/mL  FOLATE      Result Value Range   Folate 13.6    IRON AND TIBC      Result Value Range   Iron 60  42 - 145 ug/dL   UIBC 782  956 - 213 ug/dL   TIBC 086  578 - 469 ug/dL   %SAT 22  20 - 55 %  FERRITIN      Result Value Range   Ferritin 95  10 - 291 ng/mL  BASIC METABOLIC PANEL (CC13)      Result Value Range   Sodium 136  136 - 145 mEq/L   Potassium 4.3  3.5 - 5.1 mEq/L   Chloride 107  98 - 107 mEq/L    CO2 23  22 - 29 mEq/L   Glucose 163 (*) 70 - 99 mg/dl   BUN 62.9  7.0 - 52.8 mg/dL   Creatinine 0.6  0.6 - 1.1 mg/dL   Calcium 7.1 (*) 8.4 - 10.4 mg/dL   Last CMET in this EMR 11-2012 AP 260 (has ranged 174-322 since 06-2011), total protein 6.6 and alb 3.6, calcium 7.0  WIth complaints of feeling cold  and having confirmed with PCP office no recent TFTs, these were added to blood work today and resulted after visit with TSH  0.65, T3  92.9 and T4  7.2  Studies/Results: No DEXA study in this EMR in past 3 years.  Last CXR this EMR 04-2010 lungs clear, generalized osteopenia and degenerative changes in spine  Medications: I have reviewed the patient's current medications. She no longer takes oral calcium and has not been on IV bisphosphonate, which I suggested she may want to discuss with PCP.  We have discussed iron studies as above: Hgb is a little lower than last here tho still in good range, MCV ok, serum iron good with %sat lower but ferritin good. B12 good. I will see her with repeat iron studies in 6 months.  Assessment/Plan:  1.Iron deficiency related to inadequate absorption post gastric bypass: last full dose feraheme June 2013. I will see her with repeat labs in 6 months. 2.B12 deficiency related to inadequate absorption post gastric bypass: level good now with oral supplement 3.gastric bypass 2002 with subsequent 250 lb weight loss. Chronic diarrhea related. 4.coronary artery and carotid artery disease: CABG 20 years ago and extensive collateral circulation compensating for the carotid artery occlusions. 5.30 pack year smoking history, stopped age 4 6.hx diabetes resolved with weight loss 7. TIA 2002. We will clarify if she is still on coumadin 8.overdue mammograms, which she does not want 9.cholecystectomy 10. Normal TFTs, which we will let patient know 11.low bone density: may want to consider IV bisphosphonate per PCP. I do not know if she has had DEXA scan  Patient was  comfortable with discussion and in agreement with plan.  Time spent 45 min, >50% in discussionl LIVESAY,LENNIS P, MD   03/31/2013, 12:15 PM

## 2013-04-01 ENCOUNTER — Telehealth: Payer: Self-pay | Admitting: *Deleted

## 2013-04-01 NOTE — Telephone Encounter (Signed)
Message copied by Carola Rhine A on Wed Apr 01, 2013  3:22 PM ------      Message from: Jama Flavors P      Created: Wed Apr 01, 2013  3:09 PM       Labs seen and need follow up: please let her know the thyroid tests were all ok. We will let her PCP know. ------

## 2013-04-01 NOTE — Telephone Encounter (Signed)
Attempted to call with results. No answer on home phone, fast busy signal on other phone lines

## 2013-04-06 ENCOUNTER — Telehealth: Payer: Self-pay

## 2013-04-06 NOTE — Telephone Encounter (Signed)
Message copied by Lorine Bears on Mon Apr 06, 2013  5:41 PM ------      Message from: Jama Flavors P      Created: Wed Apr 01, 2013  3:09 PM       Labs seen and need follow up: please let her know the thyroid tests were all ok. We will let her PCP know. ------

## 2013-04-06 NOTE — Telephone Encounter (Signed)
Told [name] her thyroid tests wre fine as noted below  by Dr. Darrold Span.

## 2013-04-06 NOTE — Telephone Encounter (Signed)
ENCOUNTER OPENED IN ERROR

## 2013-05-07 ENCOUNTER — Telehealth: Payer: Self-pay | Admitting: Oncology

## 2013-05-07 NOTE — Telephone Encounter (Signed)
Faxed pt medical records to [facility] °

## 2013-09-07 ENCOUNTER — Other Ambulatory Visit: Payer: Self-pay | Admitting: Gastroenterology

## 2013-09-07 DIAGNOSIS — R112 Nausea with vomiting, unspecified: Secondary | ICD-10-CM

## 2013-09-17 ENCOUNTER — Ambulatory Visit
Admission: RE | Admit: 2013-09-17 | Discharge: 2013-09-17 | Disposition: A | Discharge status: Home / Self Care | Payer: Federal, State, Local not specified - PPO | Source: Ambulatory Visit | Attending: Gastroenterology | Admitting: Gastroenterology

## 2013-09-17 DIAGNOSIS — R112 Nausea with vomiting, unspecified: Secondary | ICD-10-CM

## 2013-09-19 ENCOUNTER — Encounter: Payer: Self-pay | Admitting: *Deleted

## 2013-09-22 ENCOUNTER — Encounter: Payer: Self-pay | Admitting: Cardiovascular Disease

## 2013-09-22 ENCOUNTER — Ambulatory Visit (INDEPENDENT_AMBULATORY_CARE_PROVIDER_SITE_OTHER): Payer: Federal, State, Local not specified - PPO | Admitting: Cardiovascular Disease

## 2013-09-22 VITALS — BP 120/82 | HR 71 | Ht 64.0 in | Wt 164.6 lb

## 2013-09-22 DIAGNOSIS — D509 Iron deficiency anemia, unspecified: Secondary | ICD-10-CM

## 2013-09-22 DIAGNOSIS — I658 Occlusion and stenosis of other precerebral arteries: Secondary | ICD-10-CM

## 2013-09-22 DIAGNOSIS — G932 Benign intracranial hypertension: Secondary | ICD-10-CM

## 2013-09-22 DIAGNOSIS — Z9884 Bariatric surgery status: Secondary | ICD-10-CM

## 2013-09-22 DIAGNOSIS — I6523 Occlusion and stenosis of bilateral carotid arteries: Secondary | ICD-10-CM

## 2013-09-22 DIAGNOSIS — I2510 Atherosclerotic heart disease of native coronary artery without angina pectoris: Secondary | ICD-10-CM

## 2013-09-22 DIAGNOSIS — I498 Other specified cardiac arrhythmias: Secondary | ICD-10-CM

## 2013-09-22 DIAGNOSIS — I5030 Unspecified diastolic (congestive) heart failure: Secondary | ICD-10-CM

## 2013-09-22 DIAGNOSIS — I471 Supraventricular tachycardia: Secondary | ICD-10-CM

## 2013-09-22 DIAGNOSIS — I6529 Occlusion and stenosis of unspecified carotid artery: Secondary | ICD-10-CM

## 2013-09-22 NOTE — Patient Instructions (Signed)
Your physician recommends that you weigh, daily, at the same time every day, and in the same amount of clothing. Please record your daily weights on the handout provided and bring it to your next appointment. Your physician recommends that you schedule a follow-up appointment in: 12 months

## 2013-09-25 ENCOUNTER — Encounter: Payer: Self-pay | Admitting: Cardiovascular Disease

## 2013-09-25 DIAGNOSIS — I6523 Occlusion and stenosis of bilateral carotid arteries: Secondary | ICD-10-CM

## 2013-09-25 DIAGNOSIS — I5030 Unspecified diastolic (congestive) heart failure: Secondary | ICD-10-CM | POA: Insufficient documentation

## 2013-09-25 DIAGNOSIS — Z9884 Bariatric surgery status: Secondary | ICD-10-CM | POA: Insufficient documentation

## 2013-09-25 DIAGNOSIS — G9340 Encephalopathy, unspecified: Secondary | ICD-10-CM | POA: Insufficient documentation

## 2013-09-25 DIAGNOSIS — I2510 Atherosclerotic heart disease of native coronary artery without angina pectoris: Secondary | ICD-10-CM | POA: Insufficient documentation

## 2013-09-25 DIAGNOSIS — I471 Supraventricular tachycardia: Secondary | ICD-10-CM

## 2013-09-25 DIAGNOSIS — I4719 Other supraventricular tachycardia: Secondary | ICD-10-CM

## 2013-09-25 HISTORY — DX: Other supraventricular tachycardia: I47.19

## 2013-09-25 HISTORY — DX: Supraventricular tachycardia: I47.1

## 2013-09-25 HISTORY — DX: Atherosclerotic heart disease of native coronary artery without angina pectoris: I25.10

## 2013-09-25 HISTORY — DX: Occlusion and stenosis of bilateral carotid arteries: I65.23

## 2013-09-25 NOTE — Progress Notes (Signed)
Patient ID: [name], female   DOB: 23-Feb-1951, 62 y.o.   MRN: [id number]      Reason for office visit Followup coronary disease and carotid artery disease  [name] returns for routine followup and is doing very well. She has no cardiovascular complaints other than occasional ankle edema for which she takes furosemide. On the average she uses diuretics less than once a week. She does not weigh herself on a regular basis. She is very careful with sodium restriction. She has a previous history of diastolic heart failure without any recent exacerbation.  She is known to have angiographic evidence of bilateral carotid artery occlusion and her entire cerebrovascular blood supply is via large vertebral arteries, previously evaluated by Dr. Darrick Penna  It has been 20 years since she underwent bypass surgery. In the past she had diabetes mellitus and an adverse lipid profile but she is now metabolically very well compensated taking no medications for diabetes or hyperlipidemia. She did require periodic venous iron infusions for iron deficiency anemia (presumed to be secondary to malabsorption since there was no evidence of a bleeding source), but even this has not been necessary for about 2 years.   Allergies  Allergen Reactions  . Metoclopramide Hcl   . Neurontin [Gabapentin]   . Penicillins     Current Outpatient Prescriptions  Medication Sig Dispense Refill  . acetaminophen (TYLENOL) 500 MG tablet Take 500 mg by mouth every 6 (six) hours as needed.      Marland Kitchen aspirin 81 MG tablet Take 81 mg by mouth daily.      . cholecalciferol (VITAMIN D) 1000 UNITS tablet Take 1,000 Units by mouth daily.      . citalopram (CELEXA) 10 MG tablet Take 10 mg by mouth daily.      . Cyanocobalamin (VITAMIN B-12) 1000 MCG SUBL Place under the tongue as needed.      . ERY-TAB 250 MG EC tablet Take 250 mg by mouth daily.       . furosemide (LASIX) 20 MG tablet Take 20 mg by mouth as needed.      Marland Kitchen morphine (MS  CONTIN) 30 MG 12 hr tablet Take 30 mg by mouth 2 (two) times daily. q 12 hours      . NEXIUM 40 MG capsule Take 40 mg by mouth 2 (two) times daily.      . nitroGLYCERIN (NITROSTAT) 0.4 MG SL tablet Place 1 tablet (0.4 mg total) under the tongue every 5 (five) minutes as needed for chest pain.  25 tablet  6  . omeprazole (PRILOSEC) 20 MG capsule Take 20 mg by mouth 2 (two) times daily.       . ondansetron (ZOFRAN) 4 MG tablet Take 4 mg by mouth 2 (two) times daily.        No current facility-administered medications for this visit.    Past Medical History  Diagnosis Date  . Ischemic heart disease   . Diastolic dysfunction   . CAD (coronary artery disease)   . Chronic anemia   . History of recurrent UTIs   . DM (diabetes mellitus)   . Peroneal neuropathy   . Retinopathy   . Stroke   . Hypocalcemia   . Morbid obesity     s/p gastric bypass  . Kidney stones   . Depression   . CAD s/p CABG 1994 09/25/2013    LIMA-LAD, SVG Diag, SVG-RCA, [place] Cath 2010: Stents in the proximal LAD with 40% in-stent restenosis and 50% post-stent  stenosis, 80% small OM 1, 70% small OM 2, 40% first PLA; diffuse 70% distal RCA; patent SVG to diagonal, patent SVG to distal RCA, patent atretic LIMA EF 55%; normal nuclear stress test 2011  . Bilateral carotid artery occlusion 09/25/2013  . 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 History of Pseudotumor cerebri 09/25/2013    Past Surgical History  Procedure Laterality Date  . Ventral hernia repair    . Appendectomy    . Vaginal hysterectomy    . Coronary artery bypass graft  1994    LIM to LAD,SVG to first diagonal,SVG to distal RCA  . Cardiac catheterization  03/30/2009    3 vessel CAD,patent grafts  . Gastric bypass  2002  . Cholecystectomy    . Heel spur excision  2012  . Trigger finger release  2012  . Radiofrequency ablation      AVNRT - successful  . Nm myocar perf wall motion  2011    no ischemia  . US echocardiography  05/09/2010      mild MR,mild mitral and aortic sclerosis    Family History  Problem Relation Age of Onset  . Diabetes Brother   . Heart attack Sister   . Hypertension    . Asthma Brother   . Cancer Father   . Diabetes Mother   . Diabetes Father   . Heart failure Mother     History   Social History  . Marital Status: Divorced    Spouse Name: N/A    Number of Children: 2  . Years of Education: N/A   Occupational History  . disabled    Social History Main Topics  . Smoking status: Former Smoker    Quit date: 12/18/1987  . Smokeless tobacco: Not on file  . Alcohol Use: No  . Drug Use: Not on file  . Sexual Activity: Not on file   Other Topics Concern  . Not on file   Social History Narrative  . No narrative on file    Review of systems: The patient specifically denies any chest pain at rest or with exertion, dyspnea at rest or with exertion, orthopnea, paroxysmal nocturnal dyspnea, syncope, palpitations, focal neurological deficits, intermittent claudication,  unexplained weight gain, cough, hemoptysis or wheezing.  The patient also denies abdominal pain, nausea, vomiting, dysphagia, diarrhea, constipation, polyuria, polydipsia, dysuria, hematuria, frequency, urgency, abnormal bleeding or bruising, fever, chills, unexpected weight changes, mood swings, change in skin or hair texture, change in voice quality, auditory or visual problems, allergic reactions or rashes, new musculoskeletal complaints other than usual "aches and pains".    PHYSICAL EXAM BP 120/82  Pulse 71  Ht 5\' 4"  (1.626 m)  Wt 164 lb 9.6 oz (74.662 kg)  BMI 28.24 kg/m2  General: Alert, oriented x3, no distress Head: no evidence of trauma, PERRL, EOMI, no exophtalmos or lid lag, no myxedema, no xanthelasma; normal ears, nose and oropharynx Neck: normal jugular venous pulsations and no hepatojugular reflux; diminished carotid pulses without delay and bilateral faint carotid bruits (presumably external carotid  bruits) Chest: clear to auscultation, no signs of consolidation by percussion or palpation, normal fremitus, symmetrical and full respiratory excursions Cardiovascular: normal position and quality of the apical impulse, regular rhythm, normal first and second heart sounds, no murmurs, rubs or gallops Abdomen: no tenderness or distention, no masses by palpation, no abnormal pulsatility or arterial bruits, normal bowel sounds, no hepatosplenomegaly Extremities: no clubbing, cyanosis or edema; 2+ radial, ulnar and brachial pulses bilaterally; 2+ right femoral,  posterior tibial and dorsalis pedis pulses; 2+ left femoral, posterior tibial and dorsalis pedis pulses; no subclavian or femoral bruits Neurological: grossly nonfocal   EKG: Normal sinus rhythm, possible left atrial enlargement, no repolarization abnormalities  Lipid Panel     Component Value Date/Time   CHOL 51 04/24/2011 2341   TRIG 47 04/24/2011 2341   HDL 20* 04/24/2011 2341   CHOLHDL 2.6 04/24/2011 2341   VLDL 9 04/24/2011 2341   LDLCALC  Value: 22        Total Cholesterol/HDL:CHD Risk Coronary Heart Disease Risk Table                     Men   Women  1/2 Average Risk   3.4   3.3  Average Risk       5.0   4.4  2 X Average Risk   9.6   7.1  3 X Average Risk  23.4   11.0        Use the calculated Patient Ratio above and the CHD Risk Table to determine the patient's CHD Risk.        ATP III CLASSIFICATION (LDL):  <100     mg/dL   Optimal  161-096  mg/dL   Near or Above                    Optimal  130-159  mg/dL   Borderline  045-409  mg/dL   High  >811     mg/dL   Very High 08/17/4781 9562    BMET    Component Value Date/Time   NA 136 03/23/2013 1401   NA 138 02/22/2012 1352   K 4.3 03/23/2013 1401   K 4.2 02/22/2012 1352   CL 107 03/23/2013 1401   CL 108 02/22/2012 1352   CO2 23 03/23/2013 1401   CO2 25 02/22/2012 1352   GLUCOSE 163* 03/23/2013 1401   GLUCOSE 220* 02/22/2012 1352   BUN 10.1 03/23/2013 1401   BUN 7 02/22/2012 1352   CREATININE 0.6 03/23/2013  1401   CREATININE 0.64 02/22/2012 1352   CALCIUM 7.1* 03/23/2013 1401   CALCIUM 8.3* 02/22/2012 1352   GFRNONAA >60 05/10/2011 1400   GFRAA  Value: >60        The eGFR has been calculated using the MDRD equation. This calculation has not been validated in all clinical situations. eGFR's persistently <60 mL/min signify possible Chronic Kidney Disease. 05/10/2011 1400     ASSESSMENT AND PLAN CAD s/p CABG 1994 She remains asymptomatic and has very well controlled risk factors. Gastric bypass has led to "cure" for her diabetes mellitus and her most recent lipid profile was also exceptionally good, blood pressure is normal on no therapy.  History of Diastolic heart failure Asymptomatic. She takes loop diuretics very infrequently. Impaired relaxation pattern by echocardiogram 2011. She uses the degree of ankle edema to make decisions regarding use of diuretics. We discussed the fact that her weight is a better overall indicator of fluid overload and she agrees to perform daily weights. She is very cautious with sodium restriction already.  Iron deficiency anemia Previous endoscopic evaluations have not shown a source of bleeding. She requires periodic intravenous iron. She may have malabsorption related to her previous gastric bypass surgery.  Orders Placed This Encounter  Procedures  . EKG 12-Lead   Meds ordered this encounter  Medications  . NEXIUM 40 MG capsule    Sig: Take 40 mg by mouth 2 (two) times  daily.    Junious Silk, MD, [facility] [phone] office [phone] pager

## 2013-09-25 NOTE — Assessment & Plan Note (Signed)
She remains asymptomatic and has very well controlled risk factors. Gastric bypass has led to "cure" for her diabetes mellitus and her most recent lipid profile was also exceptionally good, blood pressure is normal on no therapy.

## 2013-09-25 NOTE — Assessment & Plan Note (Addendum)
Asymptomatic. She takes loop diuretics very infrequently. Impaired relaxation pattern by echocardiogram 2011. She uses the degree of ankle edema to make decisions regarding use of diuretics. We discussed the fact that her weight is a better overall indicator of fluid overload and she agrees to perform daily weights. She is very cautious with sodium restriction already.

## 2013-09-25 NOTE — Assessment & Plan Note (Signed)
Previous endoscopic evaluations have not shown a source of bleeding. She requires periodic intravenous iron. She may have malabsorption related to her previous gastric bypass surgery.

## 2013-09-28 ENCOUNTER — Encounter: Payer: Self-pay | Admitting: Cardiovascular Disease

## 2013-09-30 ENCOUNTER — Ambulatory Visit (HOSPITAL_BASED_OUTPATIENT_CLINIC_OR_DEPARTMENT_OTHER): Payer: Federal, State, Local not specified - PPO | Admitting: Internal Medicine

## 2013-09-30 ENCOUNTER — Other Ambulatory Visit: Payer: Self-pay | Admitting: Internal Medicine

## 2013-09-30 ENCOUNTER — Other Ambulatory Visit (HOSPITAL_BASED_OUTPATIENT_CLINIC_OR_DEPARTMENT_OTHER): Payer: Federal, State, Local not specified - PPO | Admitting: Lab

## 2013-09-30 VITALS — BP 148/60 | HR 73 | Temp 98.2°F | Resp 18 | Ht 64.0 in | Wt 164.1 lb

## 2013-09-30 DIAGNOSIS — D509 Iron deficiency anemia, unspecified: Secondary | ICD-10-CM

## 2013-09-30 DIAGNOSIS — Z9884 Bariatric surgery status: Secondary | ICD-10-CM

## 2013-09-30 DIAGNOSIS — E538 Deficiency of other specified B group vitamins: Secondary | ICD-10-CM

## 2013-09-30 LAB — FERRITIN CHCC: Ferritin: 70 ng/ml (ref 9–269)

## 2013-09-30 LAB — COMPREHENSIVE METABOLIC PANEL (CC13)
Alkaline Phosphatase: 354 U/L — ABNORMAL HIGH (ref 40–150)
Anion Gap: 4 mEq/L (ref 3–11)
BUN: 9.1 mg/dL (ref 7.0–26.0)
CO2: 23 mEq/L (ref 22–29)
Calcium: 6.5 mg/dL — ABNORMAL LOW (ref 8.4–10.4)
Chloride: 109 mEq/L (ref 98–109)
Creatinine: 0.7 mg/dL (ref 0.6–1.1)

## 2013-09-30 LAB — CBC WITH DIFFERENTIAL/PLATELET
Basophils Absolute: 0 10*3/uL (ref 0.0–0.1)
HCT: 35.8 % (ref 34.8–46.6)
HGB: 12 g/dL (ref 11.6–15.9)
LYMPH%: 27.5 % (ref 14.0–49.7)
MCH: 32.3 pg (ref 25.1–34.0)
MONO#: 0.4 10*3/uL (ref 0.1–0.9)
NEUT%: 64.1 % (ref 38.4–76.8)
Platelets: 145 10*3/uL (ref 145–400)
WBC: 6.3 10*3/uL (ref 3.9–10.3)
lymph#: 1.7 10*3/uL (ref 0.9–3.3)

## 2013-09-30 LAB — IRON AND TIBC CHCC
Iron: 75 ug/dL (ref 41–142)
TIBC: 311 ug/dL (ref 236–444)
UIBC: 236 ug/dL (ref 120–384)

## 2013-09-30 NOTE — Patient Instructions (Signed)

## 2013-10-02 NOTE — Progress Notes (Signed)
[facility] OFFICE PROGRESS NOTE  FULP, CAMMIE, MD [address] [place] [address] [name] [id number]  DIAGNOSIS: H/O gastric bypass - Plan: CBC with Differential, Comprehensive metabolic panel, Iron and TIBC, Ferritin  Iron deficiency anemia - Plan: CBC with Differential, Comprehensive metabolic panel, Iron and TIBC, Ferritin  Chief Complaint  Patient presents with  . Iron deficiency anemia    CURRENT THERAPY: Oral B12 and has needed IV iron intermittently, last with Feraheme in June 2013.   INTERVAL HISTORY: [name] 62 y.o. 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 She was last seen on March 31, 2013 by Dr. Darrold Span.  She sees PCP Dr [name] q 6 months. She sees Dr Royann Shivers yearly, Dr Vear Clock every other month, saw Dr Dulce Sellar recently and has a follow up visit upcoming.  Patient was seen by Dr Dalene Carrow for first time in 07-2004, with long history of iron deficiency anemia. She had history of menorrhagia prior to hysterectomy for this in 1995, and gastric bypass surgery in 2002. At Dr Lonell Face first visit, Hgb was 9.2, MCV 74, serum iron 25. Bone marrow exam 07-24-2004 had trilineage hematopoiesis with absent iron stores. SPEP in 12-2009 had no findings of concern. She had IV iron, including Infed in 01-2009 and feraheme 1020 mg in 05-2012. She has not continued oral iron. She is on oral B12. She is not aware of any bleeding. She has low bone density, thought also related to absorption problems from gastric bypass.   Today she denies any recent emergency room visits or hospitalizations.  She still reports diarrhea secondary to her gastric bypass.  She is scheduled to see Dr. Dulce Sellar next week as noted above.  She saw cardiology last week for her check up without modifications in her medicine.   She feels tired and still feels cold at time. 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  She has early satiety since gastric bypass, eats small amounts multiple times daily.She lost 250 lbs with the gastric bypass, but has gained some weight recently. Bowels move 5-10 times daily despite no liquids with meals and not improved with imodium. She has significant GERD, just began omeprazole by Dr Dulce Sellar in past 2 weeks. She "vomits" mucous about 15 min after eating, has just started zofran also by Dr Dulce Sellar. She has overactive bladder, "oxy patch" not helpful. Off DM meds x 6 years, still follows blood sugars at home, highest 130. No Sloan, wears reading glasses, no cardiac symptoms.   MEDICAL HISTORY: Past Medical History  Diagnosis Date  . Ischemic heart disease   . Diastolic dysfunction   . CAD (coronary artery disease)   . Chronic anemia   . History of recurrent UTIs   . DM (diabetes mellitus)   . Peroneal neuropathy   . Retinopathy   . Stroke   . Hypocalcemia   . Morbid obesity     s/p gastric bypass  . Kidney stones   . Depression   . CAD s/p CABG 1994 09/25/2013    LIMA-LAD, SVG Diag, SVG-RCA, Hendrickson Cath 2010: Stents in the proximal LAD with 40% in-stent restenosis and 50% post-stent stenosis, 80% small OM 1, 70% small OM 2, 40% first PLA; diffuse 70% distal RCA; patent SVG to diagonal, patent SVG to distal RCA, patent atretic LIMA EF 55%; normal nuclear stress test 2011  . Bilateral carotid artery  occlusion 09/25/2013  . 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 History of Pseudotumor cerebri 09/25/2013    INTERIM HISTORY: has Iron deficiency anemia; CAD s/p CABG 1994; Bilateral carotid artery occlusion; AVNRT s/p successful RF ablation; H/O gastric bypass; History of Pseudotumor cerebri; and History of Diastolic heart failure on her problem list.    ALLERGIES:  is allergic to metoclopramide hcl; neurontin; and penicillins.  MEDICATIONS: has a current medication list which includes the following prescription(s): acetaminophen, aspirin,  cholecalciferol, citalopram, vitamin b-12, ery-tab, furosemide, morphine, nexium, nitroglycerin, and ondansetron.  SURGICAL HISTORY:  Past Surgical History  Procedure Laterality Date  . Ventral hernia repair    . Appendectomy    . Vaginal hysterectomy    . Coronary artery bypass graft  1994    LIM to LAD,SVG to first diagonal,SVG to distal RCA  . Cardiac catheterization  03/30/2009    3 vessel CAD,patent grafts  . Gastric bypass  2002  . Cholecystectomy    . Heel spur excision  2012  . Trigger finger release  2012  . Radiofrequency ablation      AVNRT - successful  . Nm myocar perf wall motion  2011    no ischemia  . US echocardiography  05/09/2010    mild MR,mild mitral and aortic sclerosis    REVIEW OF SYSTEMS:   Constitutional: Denies fevers, chills or abnormal weight loss Eyes: Denies blurriness of vision Ears, nose, mouth, throat, and face: Denies mucositis or sore throat Respiratory: Denies cough, dyspnea or wheezes Cardiovascular: Denies palpitation, chest discomfort or lower extremity swelling Gastrointestinal:  Denies nausea, heartburn or change in bowel habits Skin: Denies abnormal skin rashes Lymphatics: Denies new lymphadenopathy or easy bruising Neurological:Denies numbness, tingling or new weaknesses Behavioral/Psych: Mood is stable, no new changes  All other systems were reviewed with the patient and are negative.  PHYSICAL EXAMINATION: ECOG PERFORMANCE STATUS: 0 - Asymptomatic  Blood pressure 148/60, pulse 73, temperature 98.2 F (36.8 C), temperature source Oral, resp. rate 18, height 5\' 4"  (1.626 m), weight 74.435 kg (164 lb 1.6 oz).  GENERAL:alert, no distress and comfortable SKIN: skin color, texture, turgor are normal, no rashes or significant lesions; median sternotomy scar.  Multiple ecchymoses on UE.  EYES: normal, Conjunctiva are pink and non-injected, sclera clear OROPHARYNX:no exudate, no erythema and lips, buccal mucosa, and tongue normal; poor  dentition.  NECK: supple, thyroid normal size, non-tender, without nodularity LYMPH:  no palpable lymphadenopathy in the cervical, axillary or supraclavicular LUNGS: clear to auscultation and percussion with normal breathing effort HEART: regular rate & rhythm and no murmurs and no lower extremity edema ABDOMEN:abdomen soft, non-tender and normal bowel sounds Musculoskeletal:no cyanosis of digits and no clubbing  NEURO: alert & oriented x 3 with fluent speech, no focal motor/sensory deficits   LABORATORY DATA: Results for orders placed in visit on 09/30/13 (from the past 48 hour(s))  CBC WITH DIFFERENTIAL     Status: None   Collection Time    09/30/13  2:03 PM      Result Value Range   WBC 6.3  3.9 - 10.3 10e3/uL   NEUT# 4.0  1.5 - 6.5 10e3/uL   HGB 12.0  11.6 - 15.9 g/dL   HCT 16.1  09.6 - 04.5 %   Platelets 145  145 - 400 10e3/uL   MCV 96.3  79.5 - 101.0 fL   MCH 32.3  25.1 - 34.0 pg   MCHC 33.5  31.5 - 36.0 g/dL   RBC 4.09  8.11 -  5.45 10e6/uL   RDW 13.2  11.2 - 14.5 %   lymph# 1.7  0.9 - 3.3 10e3/uL   MONO# 0.4  0.1 - 0.9 10e3/uL   Eosinophils Absolute 0.1  0.0 - 0.5 10e3/uL   Basophils Absolute 0.0  0.0 - 0.1 10e3/uL   NEUT% 64.1  38.4 - 76.8 %   LYMPH% 27.5  14.0 - 49.7 %   MONO% 6.9  0.0 - 14.0 %   EOS% 1.1  0.0 - 7.0 %   BASO% 0.4  0.0 - 2.0 %  IRON AND TIBC [facility]     Status: None   Collection Time    09/30/13  2:03 PM      Result Value Range   Iron 75  41 - 142 ug/dL   TIBC 829  562 - 130 ug/dL   UIBC 865  784 - 696 ug/dL   %SAT 24  21 - 57 %  FERRITIN [facility]     Status: None   Collection Time    09/30/13  2:03 PM      Result Value Range   Ferritin 70  9 - 269 ng/ml  COMPREHENSIVE METABOLIC PANEL ([facility])     Status: Abnormal   Collection Time    09/30/13  2:04 PM      Result Value Range   Sodium 137  136 - 145 mEq/L   Potassium 4.1  3.5 - 5.1 mEq/L   Chloride 109  98 - 109 mEq/L   CO2 23  22 - 29 mEq/L   Glucose 130  70 - 140 mg/dl   BUN 9.1  7.0 - 29.5  mg/dL   Creatinine 0.7  0.6 - 1.1 mg/dL   Total Bilirubin 2.84  0.20 - 1.20 mg/dL   Alkaline Phosphatase 354 (*) 40 - 150 U/L   AST 35 (*) 5 - 34 U/L   ALT 33  0 - 55 U/L   Total Protein 6.4  6.4 - 8.3 g/dL   Albumin 3.3 (*) 3.5 - 5.0 g/dL   Calcium 6.5 (*) 8.4 - 10.4 mg/dL   Anion Gap 4  3 - 11 mEq/L       Labs:  Lab Results  Component Value Date   WBC 6.3 09/30/2013   HGB 12.0 09/30/2013   HCT 35.8 09/30/2013   MCV 96.3 09/30/2013   PLT 145 09/30/2013   NEUTROABS 4.0 09/30/2013      Chemistry      Component Value Date/Time   NA 137 09/30/2013 1404   NA 138 02/22/2012 1352   K 4.1 09/30/2013 1404   K 4.2 02/22/2012 1352   CL 107 03/23/2013 1401   CL 108 02/22/2012 1352   CO2 23 09/30/2013 1404   CO2 25 02/22/2012 1352   BUN 9.1 09/30/2013 1404   BUN 7 02/22/2012 1352   CREATININE 0.7 09/30/2013 1404   CREATININE 0.64 02/22/2012 1352      Component Value Date/Time   CALCIUM 6.5* 09/30/2013 1404   CALCIUM 8.3* 02/22/2012 1352   ALKPHOS 354* 09/30/2013 1404   ALKPHOS 322* 02/22/2012 1352   AST 35* 09/30/2013 1404   AST 33 02/22/2012 1352   ALT 33 09/30/2013 1404   ALT 27 02/22/2012 1352   BILITOT 0.51 09/30/2013 1404   BILITOT 0.3 02/22/2012 1352     Basic Metabolic Panel:  Recent Labs Lab 09/30/13 1404  NA 137  K 4.1  CO2 23  GLUCOSE 130  BUN 9.1  CREATININE 0.7  CALCIUM 6.5*  GFR Estimated Creatinine Clearance: 71.1 ml/min (by C-G formula based on Cr of 0.7). Liver Function Tests:  Recent Labs Lab 09/30/13 1404  AST 35*  ALT 33  ALKPHOS 354*  BILITOT 0.51  PROT 6.4  ALBUMIN 3.3*   CBC:  Recent Labs Lab 09/30/13 1403  WBC 6.3  NEUTROABS 4.0  HGB 12.0  HCT 35.8  MCV 96.3  PLT 145   Anemia work up  Recent Labs  09/30/13 1403  FERRITIN 70  TIBC 311  IRON 75   Microbiology No results found for this or any previous visit (from the past 240 hour(s)).  Studies:  No results found.   RADIOGRAPHIC STUDIES: Dg Ugi W/high Density  W/kub  09/17/2013   *RADIOLOGY REPORT*  Clinical Data:Nausea and vomiting  UPPER GI SERIES W/HIGH DENSITY W/KUB  Technique: After obtaining a scout radiograph, upper GI series performed with high density barium and effervescent agent. Thin barium also used.  Fluoroscopy Time: 6 minutes 0 seconds  Comparison: Upper GI 12/31/2006; CT abdomen 04/24/2011.  Findings: Double contrast upper GI was performed.  There is poor mucosal coating.  There is marked esophageal dysmotility with diffuse esophageal spasm and intraesophageal reflux. Redemonstrated small hiatal hernia.  Contrast did flow from the gastric pouch through anastomoses into the small bowel. Redemonstrated outpouching noted just distal to the anastomosis, not significantly changed, likely post surgical.  There is narrowing near the anastomosis which persisted on multiple projections.  IMPRESSION: 1. Postoperative changes compatible with duodenal switch procedure. Contrast passed from the esophagus through the gastric pouch into the proximal small bowel without evidence for obstruction.  There was some narrowing near the anastomosis which persists on multiple projections.  This may be postoperative or inflammatory in etiology. Consider further evaluation with EGD as clinically indicated. 2.  Small hiatal hernia. 3.  Marked esophageal dysmotility.   Original Report Authenticated By: Annia Belt, M.D    ASSESSMENT: Guy Sandifer 62 y.o. female with a history of H/O gastric bypass - Plan: CBC with Differential, Comprehensive metabolic panel, Iron and TIBC, Ferritin  Iron deficiency anemia - Plan: CBC with Differential, Comprehensive metabolic panel, Iron and TIBC, Ferritin   PLAN:  1.Iron deficiency related to inadequate absorption post gastric bypass: last full dose feraheme June 2013. We will see her with repeat labs in 6 months. Iron studies as noted above.   2.B12 deficiency related to inadequate absorption post gastric bypass: level good now with  oral supplement   3.gastric bypass 2002 with subsequent 250 lb weight loss. Chronic diarrhea related. Imodium prn.   4.coronary artery and carotid artery disease: CABG 20 years ago and extensive collateral circulation compensating for the carotid artery occlusions.   5. 30 pack year smoking history, stopped age 64   6.hx diabetes resolved with weight loss   7. TIA 2002. She is no longer on coumadin.  8.overdue mammograms, which she does not want   9.cholecystectomy   10. Normal TFTs, which we will let patient know   11.low bone density: may want to consider IV bisphosphonate per PCP.  All questions were answered. The patient knows to call the clinic with any problems, questions or concerns. We can certainly see the patient much sooner if necessary.  I spent 15 minutes counseling the patient face to face. The total time spent in the appointment was 25 minutes.    Darric Plante, MD 10/02/2013 8:15 AM

## 2013-11-17 ENCOUNTER — Telehealth: Payer: Self-pay | Admitting: Oncology

## 2013-11-17 NOTE — Telephone Encounter (Signed)
lmonvm for pt re appt for 4.13.15. schedule mailed.

## 2014-03-28 ENCOUNTER — Other Ambulatory Visit: Payer: Self-pay | Admitting: Oncology

## 2014-03-29 ENCOUNTER — Ambulatory Visit: Payer: Federal, State, Local not specified - PPO | Admitting: Oncology

## 2014-03-29 ENCOUNTER — Other Ambulatory Visit: Payer: Federal, State, Local not specified - PPO

## 2014-03-30 ENCOUNTER — Inpatient Hospital Stay (HOSPITAL_COMMUNITY): Payer: Federal, State, Local not specified - PPO

## 2014-03-30 ENCOUNTER — Encounter (HOSPITAL_COMMUNITY): Payer: Self-pay | Admitting: Emergency Medicine

## 2014-03-30 ENCOUNTER — Telehealth: Payer: Self-pay | Admitting: Oncology

## 2014-03-30 ENCOUNTER — Inpatient Hospital Stay (HOSPITAL_COMMUNITY)
Admission: EM | Admit: 2014-03-30 | Discharge: 2014-04-02 | DRG: 391 | Disposition: A | Discharge status: Home / Self Care | Payer: Federal, State, Local not specified - PPO | Attending: Internal Medicine | Admitting: Internal Medicine

## 2014-03-30 ENCOUNTER — Other Ambulatory Visit: Payer: Self-pay

## 2014-03-30 ENCOUNTER — Emergency Department (HOSPITAL_COMMUNITY): Payer: Federal, State, Local not specified - PPO

## 2014-03-30 DIAGNOSIS — I5030 Unspecified diastolic (congestive) heart failure: Secondary | ICD-10-CM

## 2014-03-30 DIAGNOSIS — Z79899 Other long term (current) drug therapy: Secondary | ICD-10-CM

## 2014-03-30 DIAGNOSIS — I6523 Occlusion and stenosis of bilateral carotid arteries: Secondary | ICD-10-CM

## 2014-03-30 DIAGNOSIS — K5289 Other specified noninfective gastroenteritis and colitis: Secondary | ICD-10-CM | POA: Diagnosis present

## 2014-03-30 DIAGNOSIS — J209 Acute bronchitis, unspecified: Secondary | ICD-10-CM | POA: Diagnosis present

## 2014-03-30 DIAGNOSIS — D509 Iron deficiency anemia, unspecified: Secondary | ICD-10-CM

## 2014-03-30 DIAGNOSIS — I509 Heart failure, unspecified: Secondary | ICD-10-CM | POA: Diagnosis present

## 2014-03-30 DIAGNOSIS — R197 Diarrhea, unspecified: Principal | ICD-10-CM | POA: Diagnosis present

## 2014-03-30 DIAGNOSIS — Z951 Presence of aortocoronary bypass graft: Secondary | ICD-10-CM

## 2014-03-30 DIAGNOSIS — I2510 Atherosclerotic heart disease of native coronary artery without angina pectoris: Secondary | ICD-10-CM | POA: Diagnosis present

## 2014-03-30 DIAGNOSIS — B9789 Other viral agents as the cause of diseases classified elsewhere: Secondary | ICD-10-CM | POA: Diagnosis present

## 2014-03-30 DIAGNOSIS — K909 Intestinal malabsorption, unspecified: Secondary | ICD-10-CM | POA: Diagnosis present

## 2014-03-30 DIAGNOSIS — K901 Tropical sprue: Secondary | ICD-10-CM | POA: Diagnosis present

## 2014-03-30 DIAGNOSIS — Z87891 Personal history of nicotine dependence: Secondary | ICD-10-CM

## 2014-03-30 DIAGNOSIS — E43 Unspecified severe protein-calorie malnutrition: Secondary | ICD-10-CM | POA: Diagnosis present

## 2014-03-30 DIAGNOSIS — E876 Hypokalemia: Secondary | ICD-10-CM | POA: Diagnosis present

## 2014-03-30 DIAGNOSIS — G9340 Encephalopathy, unspecified: Secondary | ICD-10-CM | POA: Diagnosis present

## 2014-03-30 DIAGNOSIS — Z9884 Bariatric surgery status: Secondary | ICD-10-CM

## 2014-03-30 DIAGNOSIS — E860 Dehydration: Secondary | ICD-10-CM | POA: Diagnosis present

## 2014-03-30 DIAGNOSIS — I471 Supraventricular tachycardia: Secondary | ICD-10-CM

## 2014-03-30 DIAGNOSIS — Z8673 Personal history of transient ischemic attack (TIA), and cerebral infarction without residual deficits: Secondary | ICD-10-CM

## 2014-03-30 LAB — URINE MICROSCOPIC-ADD ON

## 2014-03-30 LAB — URINALYSIS, ROUTINE W REFLEX MICROSCOPIC
Bilirubin Urine: NEGATIVE
Glucose, UA: NEGATIVE mg/dL
Ketones, ur: NEGATIVE mg/dL
NITRITE: POSITIVE — AB
Protein, ur: NEGATIVE mg/dL
SPECIFIC GRAVITY, URINE: 1.019 (ref 1.005–1.030)
UROBILINOGEN UA: 0.2 mg/dL (ref 0.0–1.0)
pH: 5.5 (ref 5.0–8.0)

## 2014-03-30 LAB — COMPREHENSIVE METABOLIC PANEL
ALBUMIN: 3.1 g/dL — AB (ref 3.5–5.2)
ALT: 21 U/L (ref 0–35)
AST: 21 U/L (ref 0–37)
Alkaline Phosphatase: 263 U/L — ABNORMAL HIGH (ref 39–117)
BUN: 9 mg/dL (ref 6–23)
CO2: 22 meq/L (ref 19–32)
CREATININE: 0.51 mg/dL (ref 0.50–1.10)
Calcium: 5.8 mg/dL — CL (ref 8.4–10.5)
Chloride: 101 mEq/L (ref 96–112)
GFR calc Af Amer: >90 mL/min (ref >90)
Glucose, Bld: 117 mg/dL — ABNORMAL HIGH (ref 70–99)
Potassium: 3.1 mEq/L — ABNORMAL LOW (ref 3.7–5.3)
Sodium: 138 mEq/L (ref 137–147)
Total Bilirubin: 0.5 mg/dL (ref 0.3–1.2)
Total Protein: 6.7 g/dL (ref 6.0–8.3)

## 2014-03-30 LAB — CBC WITH DIFFERENTIAL/PLATELET
BASOS ABS: 0 10*3/uL (ref 0.0–0.1)
BASOS PCT: 0 % (ref 0–1)
Eosinophils Absolute: 0 10*3/uL (ref 0.0–0.7)
Eosinophils Relative: 0 % (ref 0–5)
HEMATOCRIT: 35.9 % — AB (ref 36.0–46.0)
Hemoglobin: 12.2 g/dL (ref 12.0–15.0)
Lymphocytes Relative: 9 % — ABNORMAL LOW (ref 12–46)
Lymphs Abs: 0.9 10*3/uL (ref 0.7–4.0)
MCH: 32.4 pg (ref 26.0–34.0)
MCHC: 34 g/dL (ref 30.0–36.0)
MCV: 95.2 fL (ref 78.0–100.0)
MONO ABS: 0.5 10*3/uL (ref 0.1–1.0)
Monocytes Relative: 5 % (ref 3–12)
Neutro Abs: 8.5 10*3/uL — ABNORMAL HIGH (ref 1.7–7.7)
Neutrophils Relative %: 86 % — ABNORMAL HIGH (ref 43–77)
Platelets: 194 10*3/uL (ref 150–400)
RBC: 3.77 MIL/uL — ABNORMAL LOW (ref 3.87–5.11)
RDW: 15 % (ref 11.5–15.5)
WBC: 10 10*3/uL (ref 4.0–10.5)

## 2014-03-30 LAB — PHOSPHORUS: Phosphorus: 3.2 mg/dL (ref 2.3–4.6)

## 2014-03-30 LAB — TROPONIN I: Troponin I: <0.3 ng/mL (ref <0.30)

## 2014-03-30 LAB — MAGNESIUM: Magnesium: 1.5 mg/dL (ref 1.5–2.5)

## 2014-03-30 LAB — TSH: TSH: 0.715 u[IU]/mL (ref 0.350–4.500)

## 2014-03-30 MED ORDER — CITALOPRAM HYDROBROMIDE 10 MG PO TABS
10.0000 mg | ORAL_TABLET | Freq: Every day | ORAL | Status: DC
  Administered 2014-03-30 – 2014-04-02 (×4): 10 mg via ORAL
  Filled 2014-03-30 (×4): qty 1

## 2014-03-30 MED ORDER — PANTOPRAZOLE SODIUM 40 MG PO TBEC
40.0000 mg | DELAYED_RELEASE_TABLET | Freq: Every day | ORAL | Status: DC
  Administered 2014-03-30 – 2014-03-31 (×2): 40 mg via ORAL
  Filled 2014-03-30 (×2): qty 1

## 2014-03-30 MED ORDER — HYDROCODONE-ACETAMINOPHEN 5-325 MG PO TABS
1.0000 | ORAL_TABLET | ORAL | Status: DC | PRN
  Administered 2014-03-31: 1 via ORAL
  Administered 2014-03-31: 2 via ORAL
  Administered 2014-03-31: 1 via ORAL
  Administered 2014-04-01 – 2014-04-02 (×6): 2 via ORAL
  Filled 2014-03-30 (×4): qty 2
  Filled 2014-03-30: qty 1
  Filled 2014-03-30: qty 2
  Filled 2014-03-30: qty 1
  Filled 2014-03-30 (×2): qty 2

## 2014-03-30 MED ORDER — PROMETHAZINE HCL 25 MG PO TABS: 12.5000 mg | ORAL_TABLET | Freq: Four times a day (QID) | ORAL | Status: DC | PRN

## 2014-03-30 MED ORDER — IOHEXOL 300 MG/ML  SOLN
100.0000 mL | Freq: Once | INTRAMUSCULAR | Status: AC | PRN
  Administered 2014-03-30: 100 mL via INTRAVENOUS

## 2014-03-30 MED ORDER — FUROSEMIDE 10 MG/ML IJ SOLN
20.0000 mg | Freq: Once | INTRAMUSCULAR | Status: AC
  Administered 2014-03-30: 20 mg via INTRAVENOUS
  Filled 2014-03-30: qty 2

## 2014-03-30 MED ORDER — POTASSIUM CHLORIDE CRYS ER 20 MEQ PO TBCR
40.0000 meq | EXTENDED_RELEASE_TABLET | Freq: Once | ORAL | Status: AC
  Administered 2014-03-30: 40 meq via ORAL
  Filled 2014-03-30: qty 2

## 2014-03-30 MED ORDER — SODIUM CHLORIDE 0.9 % IV BOLUS (SEPSIS)
500.0000 mL | Freq: Once | INTRAVENOUS | Status: AC
  Administered 2014-03-30: 500 mL via INTRAVENOUS

## 2014-03-30 MED ORDER — IOHEXOL 300 MG/ML  SOLN: 25.0000 mL | INTRAMUSCULAR | Status: AC

## 2014-03-30 MED ORDER — DM-GUAIFENESIN ER 30-600 MG PO TB12
1.0000 | ORAL_TABLET | Freq: Two times a day (BID) | ORAL | Status: DC
  Administered 2014-03-30 – 2014-04-02 (×7): 1 via ORAL
  Filled 2014-03-30 (×9): qty 1

## 2014-03-30 MED ORDER — SODIUM CHLORIDE 0.9 % IV SOLN
1.0000 g | Freq: Once | INTRAVENOUS | Status: AC
  Administered 2014-03-30: 1 g via INTRAVENOUS
  Filled 2014-03-30: qty 10

## 2014-03-30 MED ORDER — POTASSIUM CHLORIDE IN NACL 20-0.9 MEQ/L-% IV SOLN
INTRAVENOUS | Status: DC
  Administered 2014-03-30 – 2014-04-01 (×4): via INTRAVENOUS
  Filled 2014-03-30 (×10): qty 1000

## 2014-03-30 MED ORDER — ENOXAPARIN SODIUM 40 MG/0.4ML ~~LOC~~ SOLN
40.0000 mg | SUBCUTANEOUS | Status: DC
  Administered 2014-03-31 – 2014-04-01 (×2): 40 mg via SUBCUTANEOUS
  Filled 2014-03-30 (×4): qty 0.4

## 2014-03-30 NOTE — Telephone Encounter (Signed)
returned pt call and lvm with new d.t. for April appt....mailed pt appt sched and letter to April

## 2014-03-30 NOTE — ED Notes (Signed)
CRITICAL VALUE ALERT  Critical value received: Ca 5.8  Date of notification:  03/30/2014   Time of notification:  1214  Critical value read back:yes  Nurse who received alert:  Leodis Rains  MD notified (1st page):  Abigail Butts, PA  Time of first page:  1215 No further orders at this time

## 2014-03-30 NOTE — ED Provider Notes (Signed)
CSN: [id number]     Arrival date & time 03/30/14  1048 History   First MD Initiated Contact with Patient 03/30/14 1138     Chief Complaint  Patient presents with  . Abdominal Pain     (Consider location/radiation/quality/duration/timing/severity/associated sxs/prior Treatment) The history is provided by the patient and medical records. No language interpreter was used.    [name] is a 63 y.o. female  with a hx of CAD, Diastolic  presents to the Emergency Department complaining of gradual, persistent, progressively worsening watery diarrhea onset 3 weeks ago. Associated symptoms include mild abdominal cramping.  She was initially going every hour, but now it is 3-4 x per day.  Diarrhea is watery without melena, BRB or other abnormality.  Pt also reports decreased appetite.  She is keeping up with her PO fluids, but she feels as if she is losing weight. Pt reports a Hx of C. Diff 2 years ago.   Nothing seems to make it better or worse. Pt reports she saw an urgent care for this and it was reported to her that she did not have any parasites in her diarrhea.  It is unknown if she was tested for C. diff.  Pt denies fever, chills, headache, neck pain, chest pain, abdominal pain, syncope, dysuria, hematuria. Patient reports several bouts of emesis all directly after taking her Flagyl therefore she stopped taking it. She reports no other issues with emesis.      Pt reports chest congestion with nonproductive cough x2 days with associated weakness and SOB. She reports  she is supposed to be taking Lasix for fluid overload as needed but has not taken any in more than 3 weeks.     Past Medical History  Diagnosis Date  . Ischemic heart disease   . Diastolic dysfunction   . CAD (coronary artery disease)   . Chronic anemia   . History of recurrent UTIs   . DM (diabetes mellitus)   . Peroneal neuropathy   . Retinopathy   . Stroke   . Hypocalcemia   . Morbid obesity     s/p gastric bypass  .  Kidney stones   . Depression   . CAD s/p CABG 1994 09/25/2013    LIMA-LAD, SVG Diag, SVG-RCA, Hendrickson Cath 2010: Stents in the proximal LAD with 40% in-stent restenosis and 50% post-stent stenosis, 80% small OM 1, 70% small OM 2, 40% first PLA; diffuse 70% distal RCA; patent SVG to diagonal, patent SVG to distal RCA, patent atretic LIMA EF 55%; normal nuclear stress test 2011  . Bilateral carotid artery occlusion 09/25/2013  . 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 History of Pseudotumor cerebri 09/25/2013   Past Surgical History  Procedure Laterality Date  . Ventral hernia repair    . Appendectomy    . Vaginal hysterectomy    . Coronary artery bypass graft  1994    LIM to LAD,SVG to first diagonal,SVG to distal RCA  . Cardiac catheterization  03/30/2009    3 vessel CAD,patent grafts  . Gastric bypass  2002  . Cholecystectomy    . Heel spur excision  2012  . Trigger finger release  2012  . Radiofrequency ablation      AVNRT - successful  . Nm myocar perf wall motion  2011    no ischemia  . US echocardiography  05/09/2010    mild MR,mild mitral and aortic sclerosis   Family History  Problem Relation Age of Onset  . Diabetes  Brother   . Heart attack Sister   . Hypertension    . Asthma Brother   . Cancer Father   . Diabetes Mother   . Diabetes Father   . Heart failure Mother    History  Substance Use Topics  . Smoking status: Former Smoker    Quit date: 12/18/1987  . Smokeless tobacco: Not on file  . Alcohol Use: No   OB History   Grav Para Term Preterm Abortions TAB SAB Ect Mult Living                 Review of Systems  Constitutional: Negative for fever, diaphoresis, appetite change, fatigue and unexpected weight change.  HENT: Negative for mouth sores.   Eyes: Negative for visual disturbance.  Respiratory: Positive for shortness of breath. Negative for cough, chest tightness and wheezing.   Cardiovascular: Negative for chest pain.  Gastrointestinal:  Positive for diarrhea (watery). Negative for nausea, vomiting, abdominal pain and constipation.  Endocrine: Negative for polydipsia, polyphagia and polyuria.  Genitourinary: Negative for dysuria, urgency, frequency and hematuria.  Musculoskeletal: Negative for back pain and neck stiffness.  Skin: Negative for rash.  Allergic/Immunologic: Negative for immunocompromised state.  Neurological: Positive for weakness (generalized). Negative for syncope, light-headedness and headaches.  Hematological: Does not bruise/bleed easily.  Psychiatric/Behavioral: Negative for sleep disturbance. The patient is not nervous/anxious.       Allergies  Latex; Metoclopramide hcl; Neurontin; and Penicillins  Home Medications   Prior to Admission medications   Medication Sig Start Date End Date Taking? Authorizing Provider  acetaminophen (TYLENOL) 500 MG tablet Take 500 mg by mouth every 6 (six) hours as needed.    Historical Provider, MD  aspirin 81 MG tablet Take 81 mg by mouth daily.    Historical Provider, MD  cholecalciferol (VITAMIN D) 1000 UNITS tablet Take 1,000 Units by mouth daily.    Historical Provider, MD  citalopram (CELEXA) 10 MG tablet Take 10 mg by mouth daily.    Historical Provider, MD  Cyanocobalamin (VITAMIN B-12) 1000 MCG SUBL Place under the tongue as needed.    Historical Provider, MD  ERY-TAB 250 MG EC tablet Take 250 mg by mouth daily.  03/24/13   Historical Provider, MD  furosemide (LASIX) 20 MG tablet Take 20 mg by mouth as needed.    Historical Provider, MD  morphine (MS CONTIN) 30 MG 12 hr tablet Take 30 mg by mouth 2 (two) times daily. q 12 hours    Historical Provider, MD  NEXIUM 40 MG capsule Take 40 mg by mouth 2 (two) times daily. 09/07/13   Historical Provider, MD  nitroGLYCERIN (NITROLINGUAL) 0.4 MG/SPRAY spray Place 1 spray under the tongue every 5 (five) minutes as needed for chest pain.    Historical Provider, MD  ondansetron (ZOFRAN) 4 MG tablet Take 4 mg by mouth 2 (two)  times daily.  03/17/13   Historical Provider, MD   BP 108/59  Pulse 84  Temp(Src) 98.8 F (37.1 C) (Oral)  Resp 15  Wt 151 lb (68.493 kg)  SpO2 100% Physical Exam  Nursing note and vitals reviewed. Constitutional: She is oriented to person, place, and time. She appears well-developed and well-nourished. No distress.  Awake, alert, nontoxic appearance No distress noted, patient appears tired  HENT:  Head: Normocephalic and atraumatic.  Mouth/Throat: Oropharynx is clear and moist. No oropharyngeal exudate.  Eyes: Conjunctivae are normal. No scleral icterus.  Neck: Normal range of motion. Neck supple.  Cardiovascular: Normal rate, regular rhythm and intact  distal pulses.   Pulmonary/Chest: No accessory muscle usage. Tachypnea noted. No respiratory distress. She has decreased breath sounds (throughout). She has no wheezes. She has rales (in all lung fields except apices).  Patient with increased respiratory effort and mild tachypnea Rales throughout except the apices  Abdominal: Soft. Bowel sounds are normal. She exhibits no mass. There is no tenderness. There is no rebound and no guarding.  Musculoskeletal: Normal range of motion. She exhibits no edema.  Neurological: She is alert and oriented to person, place, and time. She exhibits normal muscle tone. Coordination normal.  Speech is clear and goal oriented Moves extremities without ataxia  Skin: Skin is warm and dry. She is not diaphoretic. There is pallor.  Patient appears pale  Psychiatric: She has a normal mood and affect.    ED Course  Procedures (including critical care time) Labs Review Labs Reviewed  CBC WITH DIFFERENTIAL - Abnormal; Notable for the following:    RBC 3.77 (*)    HCT 35.9 (*)    Neutrophils Relative % 86 (*)    Neutro Abs 8.5 (*)    Lymphocytes Relative 9 (*)    All other components within normal limits  COMPREHENSIVE METABOLIC PANEL - Abnormal; Notable for the following:    Potassium 3.1 (*)     Glucose, Bld 117 (*)    Calcium 5.8 (*)    Albumin 3.1 (*)    Alkaline Phosphatase 263 (*)    All other components within normal limits  OVA AND PARASITE EXAMINATION  TROPONIN I  URINALYSIS, ROUTINE W REFLEX MICROSCOPIC  GI PATHOGEN PANEL BY PCR, STOOL  MAGNESIUM  VITAMIN D 1,25 DIHYDROXY  PARATHYROID HORMONE, INTACT (NO CA)  PHOSPHORUS    Imaging Review Dg Chest 2 View  03/30/2014   CLINICAL DATA:  Several week history of diarrhea and vomiting with history of recent travel to [place]. Short of breath.  EXAM: CHEST  2 VIEW  COMPARISON:  Prior chest x-ray 05/05/2010  FINDINGS: Cardiac and mediastinal contours remain unchanged. Patient is status post median sternotomy with evidence of multivessel CABG including LIMA bypass. Stable central bronchitic changes and mild diffuse interstitial prominence. Negative for focal airspace consolidation, pulmonary edema, pleural effusion or pneumothorax. A metallic coronary stent projects over the heart. No suspicious pulmonary nodule. No acute osseous abnormality. Surgical clips noted in the right upper quadrant.  IMPRESSION: No active cardiopulmonary disease.   Electronically Signed   By: Jacqulynn Cadet M.D.   On: 03/30/2014 13:18     EKG Interpretation   Date/Time:  Tuesday March 30 2014 13:55:51 EDT Ventricular Rate:  79 PR Interval:  122 QRS Duration: 88 QT Interval:  443 QTC Calculation: 508 R Axis:   66 Text Interpretation:  Age not entered, assumed to be  63 years old for  purpose of ECG interpretation Sinus rhythm Prolonged QT interval Confirmed  by RAY MD, Andee Poles [phone]) on 03/30/2014 2:40:11 PM      MDM   Final diagnoses:  Hypocalcemia  Diarrhea  Hypokalemia   Fulton Reek presents with 3 weeks of diarrhea.  Patient initially only discusses diarrhea however on exam she has Rales and she then endorses shortness of breath and weakness.    We'll obtain troponin and EKG, basic labs and stool samples.    1:30PM Troponin  normal, CBC without anemia or leukocytosis. CMP with hypokalemia at 3.1 and hypocalcemia at 5.8.  Pt with prolonged QT; will replace calcium  2:28 PM Chest x-ray without evidence of pneumonia, pulmonary  edema or pneumothorax.  Pt with BP 97/57.  Repletion of potassium and calcium.    2:59 PM Discussed with Pattricia Boss, MD who recommends 572mL bolus of fluid and admission.  Pt abd remains soft and nontender.    Discussed with Dr. Wynelle Cleveland of [facility] who requests CT abd and will admit to med-surg.    BP 108/59  Pulse 84  Temp(Src) 98.8 F (37.1 C) (Oral)  Resp 15  Wt 151 lb (68.493 kg)  SpO2 100%  The patient was discussed with and seen by Dr. Jeanell Sparrow who agrees with the treatment plan.   Abigail Butts, PA-C 03/30/14 [place], PA-C 03/30/14 1500

## 2014-03-30 NOTE — H&P (Signed)
[facility] History and Physical  [name] NWG:[id number] DOB: [birth year] DOA: 03/30/2014  PCP: Antony Blackbird, MD  Specialists: Dr Paulita Fujita, Dr Nicholaus Bloom (pain management), Dr Marko Plume  Chief Complaint: diarrhea for 3 wks.   HPI: [name] is a 63 y.o. female with PMH as below developed diarrhea after returning home from [place] and the day of return. This was March 18th. She has 1-2 episodes a day of watery stool. She is not eating much but has tried eating solid food. She has had a couple of BMs that were soft. She had an appt with Dr Paulita Fujita with GI this Thursday but decided to come in today. She states this is because last night she was urinating on herself every time she stood up and every muscle in her body hurts. No fevers, blood in stool, or nausea or vomiting. She was given a Z pak and another antibiotic last Thursday at urgent care but when she took them she began to have nausea and vomiting. She stopped both after the urgent care doc told her to stop. The stool samples at that time were negative for ova/parasites. She feels like she is severely dehydrated. In there ER she actually received Lasix because the ED suspected she had CHF.  I noted she has a cough which she said started in the last couple of days. She took Lamoni DM but this has not helped. Sputum is yellow.  She has lost 19 lbs of weight since being sick this past 2 wks. abd pain only when she coughs.   General: The patient denies fever Cardiac: Denies chest pain, syncope, palpitations, pedal edema  Respiratory: Denies dyspnea on exertion, shortness of breath, wheezing  GI: Denies severe  indigestion/heartburn, abdominal pain, nausea, vomiting, diarrhea  GU: Denies hematuria, dysuria + incontinence Musculoskeletal: + muscle pain Skin: Denies suspicious skin lesions Neurologic: Denies focal weakness or numbness, change in vision  Past Medical History  Diagnosis Date  . Ischemic heart disease   .  Diastolic dysfunction   . Chronic anemia   . History of recurrent UTIs   . Peroneal neuropathy   . Retinopathy   . Stroke   . Hypocalcemia   . Morbid obesity     s/p gastric bypass  . Kidney stones   . Depression   . CAD s/p CABG 1994 09/25/2013    LIMA-LAD, SVG Diag, SVG-RCA, Hendrickson Cath 2010: Stents in the proximal LAD with 40% in-stent restenosis and 50% post-stent stenosis, 80% small OM 1, 70% small OM 2, 40% first PLA; diffuse 70% distal RCA; patent SVG to diagonal, patent SVG to distal RCA, patent atretic LIMA EF 55%; normal nuclear stress test 2011  . Bilateral carotid artery occlusion 09/25/2013  . 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 Ventral hernia repair    . Appendectomy    . Vaginal hysterectomy    . Coronary artery bypass graft  1994    LIM to LAD,SVG to first diagonal,SVG to distal RCA  . Cardiac catheterization  03/30/2009    3 vessel CAD,patent grafts  . Gastric bypass  2002  . Cholecystectomy    . Heel spur excision  2012  . Trigger finger release  2012  . Radiofrequency ablation      AVNRT - successful  . Nm myocar perf wall motion  2011    no ischemia  . US echocardiography  05/09/2010    mild MR,mild mitral and aortic  sclerosis   Social History:  reports that she quit smoking about 26 years ago. She does not have any smokeless tobacco history on file. She reports that she does not drink alcohol or use illicit drugs. Lives at home, retired Good with ADLs  Allergies  Allergen Reactions  . Latex   . Metoclopramide Hcl   . Neurontin [Gabapentin]   . Penicillins     Family History  Problem Relation Age of Onset  . Diabetes Brother   . Heart attack Sister   . Hypertension    . Asthma Brother   . Cancer Father   . Diabetes Mother   . Diabetes Father   . Heart failure Mother      Prior to Admission medications   Medication Sig Start Date End Date Taking? Authorizing Provider   acetaminophen (TYLENOL) 500 MG tablet Take 1,500 mg by mouth every 6 (six) hours as needed for mild pain.    Yes Historical Provider, MD  citalopram (CELEXA) 10 MG tablet Take 10 mg by mouth daily.   Yes Historical Provider, MD  Cyanocobalamin (VITAMIN B-12) 1000 MCG SUBL Place under the tongue as needed.   Yes Historical Provider, MD  ERY-TAB 250 MG EC tablet Take 250 mg by mouth daily.  03/24/13  Yes Historical Provider, MD  furosemide (LASIX) 20 MG tablet Take 20 mg by mouth as needed for fluid.    Yes Historical Provider, MD  morphine (MS CONTIN) 30 MG 12 hr tablet Take 30 mg by mouth 2 (two) times daily. q 12 hours   Yes Historical Provider, MD  NEXIUM 40 MG capsule Take 40 mg by mouth 2 (two) times daily. 09/07/13  Yes Historical Provider, MD  nitroGLYCERIN (NITROLINGUAL) 0.4 MG/SPRAY spray Place 1 spray under the tongue every 5 (five) minutes as needed for chest pain.   Yes Historical Provider, MD  ondansetron (ZOFRAN) 4 MG tablet Take 4 mg by mouth 2 (two) times daily.  03/17/13  Yes Historical Provider, MD     Physical Exam: Filed Vitals:   03/30/14 1230 03/30/14 1245 03/30/14 1328 03/30/14 1415  BP: 107/53 104/56 107/52 108/59  Pulse: 84 79 82 84  Temp:      TempSrc:      Resp:   16 15  Weight:      SpO2: 96% 95%  100%     General: AAO x 3, coughing-  HEENT: Normocephalic and Atraumatic, Mucous membranes pink                PERRLA; EOM intact; No scleral icterus,                 Nares: Patent, Oropharynx: Clear, Fair Dentition                 Neck: FROM, no cervical lymphadenopathy, thyromegaly, carotid bruit or JVD;  Breasts: deferred CHEST WALL: No tenderness  CHEST: Normal respiration, clear to auscultation bilaterally  HEART: Regular rate and rhythm; no murmurs rubs or gallops  BACK: No kyphosis or scoliosis; no CVA tenderness  ABDOMEN: Positive Bowel Sounds, soft, non-tender; no masses, no organomegaly Rectal Exam: deferred EXTREMITIES: No cyanosis, clubbing, or  edema Genitalia: not examined  SKIN:  no rash or ulceration  CNS: Alert and Oriented x 4, Nonfocal exam, CN 2-12 intact  Labs on Admission:  Basic Metabolic Panel:  Recent Labs Lab 03/30/14 1120  NA 138  K 3.1*  CL 101  CO2 22  GLUCOSE 117*  BUN 9  CREATININE 0.51  CALCIUM 5.8*   Liver Function Tests:  Recent Labs Lab 03/30/14 1120  AST 21  ALT 21  ALKPHOS 263*  BILITOT 0.5  PROT 6.7  ALBUMIN 3.1*   No results found for this basename: LIPASE, AMYLASE,  in the last 168 hours No results found for this basename: AMMONIA,  in the last 168 hours CBC:  Recent Labs Lab 03/30/14 1120  WBC 10.0  NEUTROABS 8.5*  HGB 12.2  HCT 35.9*  MCV 95.2  PLT 194   Cardiac Enzymes:  Recent Labs Lab 03/30/14 1245  TROPONINI <0.30    BNP (last 3 results) No results found for this basename: PROBNP,  in the last 8760 hours CBG: No results found for this basename: GLUCAP,  in the last 168 hours  Radiological Exams on Admission: Dg Chest 2 View  03/30/2014   CLINICAL DATA:  Several week history of diarrhea and vomiting with history of recent travel to [place]. Short of breath.  EXAM: CHEST  2 VIEW  COMPARISON:  Prior chest x-ray 05/05/2010  FINDINGS: Cardiac and mediastinal contours remain unchanged. Patient is status post median sternotomy with evidence of multivessel CABG including LIMA bypass. Stable central bronchitic changes and mild diffuse interstitial prominence. Negative for focal airspace consolidation, pulmonary edema, pleural effusion or pneumothorax. A metallic coronary stent projects over the heart. No suspicious pulmonary nodule. No acute osseous abnormality. Surgical clips noted in the right upper quadrant.  IMPRESSION: No active cardiopulmonary disease.   Electronically Signed   By: Jacqulynn Cadet M.D.   On: 03/30/2014 13:18    EKG: Independently reviewed. Sinus rhythm with prolonged QTc - 508 ms  Assessment/Plan Principal Problem:   Diarrhea - watery- but  does not seem too significant in amount if she is going only 2x day.  - Will obtain stool studies and a CT scan.  - She had an appt for this Thursday with Dr Paulita Fujita,  therefore will ask for a GI eval.  - hydrate with IVF  Active Problems:    Hypocalcemia/ hypokalemia - replaced IV, recheck in AM- had chronically low calcium but this is lower than her usual. - f/u on Mag and replaced as needed  Prolonged Qtc - repeat EKG in AM - avoid QT prolonging meds    Bronchitis, acute - likely viral- monitor- guaifenesin and dextromethorphan ordered    CAD s/p CABG 1994 - stable    H/O gastric bypass - no long term complications  Consulted: [facility]  Code Status: Full code  Family Communication: with son  Disposition Plan: 1-2 day stay   Time spent: >45 min  Debbe Odea, MD [facility]  If 7PM-7AM, please contact night-coverage [email or web address] 03/30/2014, 3:42 PM

## 2014-03-30 NOTE — ED Notes (Signed)
Pt states started having lower abdominal pain for the last 3 weeks, pt states she takes morphine for back pain.  Pt states she started having explosive diarrhea after coming back from [place] 3 weeks ago and the diarrhea continues.  Pt was seen at urgent care last week and was given thing for stool samples.  States it was sent of and was told no parasite.  Pt reports some vomiting as well.  Pt was placed on zpack and another anbx (?Flagyl).

## 2014-03-30 NOTE — ED Notes (Signed)
Hospitalist at bedside 

## 2014-03-30 NOTE — ED Notes (Signed)
Patient transported to X-ray 

## 2014-03-31 DIAGNOSIS — R197 Diarrhea, unspecified: Principal | ICD-10-CM

## 2014-03-31 DIAGNOSIS — J209 Acute bronchitis, unspecified: Secondary | ICD-10-CM

## 2014-03-31 DIAGNOSIS — I658 Occlusion and stenosis of other precerebral arteries: Secondary | ICD-10-CM

## 2014-03-31 DIAGNOSIS — I6529 Occlusion and stenosis of unspecified carotid artery: Secondary | ICD-10-CM

## 2014-03-31 DIAGNOSIS — Z9884 Bariatric surgery status: Secondary | ICD-10-CM

## 2014-03-31 DIAGNOSIS — I2510 Atherosclerotic heart disease of native coronary artery without angina pectoris: Secondary | ICD-10-CM

## 2014-03-31 DIAGNOSIS — E43 Unspecified severe protein-calorie malnutrition: Secondary | ICD-10-CM | POA: Diagnosis present

## 2014-03-31 DIAGNOSIS — I5030 Unspecified diastolic (congestive) heart failure: Secondary | ICD-10-CM

## 2014-03-31 LAB — BASIC METABOLIC PANEL
BUN: 7 mg/dL (ref 6–23)
CHLORIDE: 107 meq/L (ref 96–112)
CO2: 23 mEq/L (ref 19–32)
Calcium: 6.2 mg/dL — CL (ref 8.4–10.5)
Creatinine, Ser: 0.53 mg/dL (ref 0.50–1.10)
GFR calc Af Amer: >90 mL/min (ref >90)
Glucose, Bld: 101 mg/dL — ABNORMAL HIGH (ref 70–99)
POTASSIUM: 3.4 meq/L — AB (ref 3.7–5.3)
SODIUM: 145 meq/L (ref 137–147)

## 2014-03-31 LAB — CBC
HEMATOCRIT: 35.9 % — AB (ref 36.0–46.0)
Hemoglobin: 11.8 g/dL — ABNORMAL LOW (ref 12.0–15.0)
MCH: 32.2 pg (ref 26.0–34.0)
MCHC: 32.9 g/dL (ref 30.0–36.0)
MCV: 97.8 fL (ref 78.0–100.0)
Platelets: 185 10*3/uL (ref 150–400)
RBC: 3.67 MIL/uL — AB (ref 3.87–5.11)
RDW: 15.5 % (ref 11.5–15.5)
WBC: 7.1 10*3/uL (ref 4.0–10.5)

## 2014-03-31 LAB — GI PATHOGEN PANEL BY PCR, STOOL
C DIFFICILE TOXIN A/B: NEGATIVE
CAMPYLOBACTER BY PCR: NEGATIVE
Cryptosporidium by PCR: NEGATIVE
E COLI (STEC): NEGATIVE
E coli (ETEC) LT/ST: NEGATIVE
E coli 0157 by PCR: NEGATIVE
G LAMBLIA BY PCR: NEGATIVE
Norovirus GI/GII: NEGATIVE
ROTAVIRUS A BY PCR: NEGATIVE
Salmonella by PCR: NEGATIVE
Shigella by PCR: NEGATIVE

## 2014-03-31 LAB — PARATHYROID HORMONE, INTACT (NO CA): PTH: 292.1 pg/mL — AB (ref 14.0–72.0)

## 2014-03-31 LAB — CLOSTRIDIUM DIFFICILE BY PCR: Toxigenic C. Difficile by PCR: NEGATIVE

## 2014-03-31 MED ORDER — MAGNESIUM SULFATE 40 MG/ML IJ SOLN
2.0000 g | Freq: Once | INTRAMUSCULAR | Status: AC
  Administered 2014-03-31: 2 g via INTRAVENOUS
  Filled 2014-03-31: qty 50

## 2014-03-31 MED ORDER — CALCIUM CARBONATE ANTACID 500 MG PO CHEW
400.0000 mg | CHEWABLE_TABLET | Freq: Three times a day (TID) | ORAL | Status: DC
  Administered 2014-03-31 – 2014-04-02 (×7): 400 mg via ORAL
  Filled 2014-03-31 (×10): qty 2

## 2014-03-31 MED ORDER — MORPHINE SULFATE ER 30 MG PO TBCR
30.0000 mg | EXTENDED_RELEASE_TABLET | Freq: Two times a day (BID) | ORAL | Status: DC
  Administered 2014-03-31 – 2014-04-02 (×5): 30 mg via ORAL
  Filled 2014-03-31 (×5): qty 1

## 2014-03-31 MED ORDER — SODIUM CHLORIDE 0.9 % IV SOLN
1.0000 g | Freq: Once | INTRAVENOUS | Status: AC
  Administered 2014-03-31: 1 g via INTRAVENOUS
  Filled 2014-03-31: qty 10

## 2014-03-31 MED ORDER — CALCIUM CARBONATE ANTACID 500 MG PO CHEW
1.0000 | CHEWABLE_TABLET | Freq: Three times a day (TID) | ORAL | Status: DC
  Administered 2014-03-31: 200 mg via ORAL
  Filled 2014-03-31 (×3): qty 1

## 2014-03-31 NOTE — Progress Notes (Signed)
INITIAL NUTRITION ASSESSMENT  Pt meets criteria for severe MALNUTRITION in the context of acute illness as evidenced by <50% estimated energy intake with 11.5% weight loss in the past 3 weeks per pt report.  DOCUMENTATION CODES Per approved criteria  -Severe malnutrition in the context of acute illness or injury   INTERVENTION: - Encouraged increased meal intake  - RD to continue to monitor   NUTRITION DIAGNOSIS: Altered GI function related to prolonged diarrhea as evidenced by H&P.   Goal: 1. Resolution of diarrhea 2. Pt to consume >90% of meals  Monitor:  Weights, labs, intake, diarrhea  Reason for Assessment: Malnutrition screening tool   63 y.o. female  Admitting Dx: Diarrhea  ASSESSMENT: Pt presentsw with diarrhea after returning home from [place] and the day of return was March 18th. She has had 4-5 episodes a day of watery stool. Concerned for Tropical sprue given recent travel. Possible viral illness or collagenous colitis.  Pt reports hx of duodenal switch gastric bypass in 2004 in [place] so it takes her a long time to eat her meals (>1 hour). Reports very poor appetite in the past 3 weeks with 20-25 pound unintended weight loss. Was eating things like salads, shrimp, and sandwiches. Has been drinking 3-4 Isopure bariatric shakes/day at home. Had vomiting for 2 days PTA but states this has resolved. C/o only a little diarrhea today. Denies any abdominal pain. Eating lunch during visit. Reports appetite improving and denies need for any nutritional supplements.   Potassium low, getting IV replacement Alk phos elevated   Height: Ht Readings from Last 1 Encounters:  03/31/14 $RemoveB'5\' 5"'mznrUEoU$  (1.651 m)    Weight: Wt Readings from Last 1 Encounters:  03/30/14 153 lb 14.4 oz (69.809 kg)    Ideal Body Weight: 125 lbs   % Ideal Body Weight: 122%  Wt Readings from Last 10 Encounters:  03/30/14 153 lb 14.4 oz (69.809 kg)  09/30/13 164 lb 1.6 oz (74.435 kg)  09/22/13  164 lb 9.6 oz (74.662 kg)  03/31/13 167 lb 11.2 oz (76.068 kg)  11/26/12 162 lb (73.483 kg)  05/28/12 150 lb 9.6 oz (68.312 kg)    Usual Body Weight: 173-178 lbs per pt  % Usual Body Weight: 86-88%  BMI:  Body mass index is 25.61 kg/(m^2).  Estimated Nutritional Needs: Kcal: 1400-1600 Protein: 70-85g Fluid: 1.4-1.6L/day  Skin: Non-pitting RLE, LLE edema  Diet Order: General  EDUCATION NEEDS: -No education needs identified at this time   Intake/Output Summary (Last 24 hours) at 03/31/14 1557 Last data filed at 03/31/14 1000  Gross per 24 hour  Intake    240 ml  Output      0 ml  Net    240 ml    Last BM: 4/15, diarrhea  Labs:   Recent Labs Lab 03/30/14 1120 03/30/14 1438 03/31/14 0630  NA 138  --  145  K 3.1*  --  3.4*  CL 101  --  107  CO2 22  --  23  BUN 9  --  7  CREATININE 0.51  --  0.53  CALCIUM 5.8*  --  6.2*  MG  --  1.5  --   PHOS  --  3.2  --   GLUCOSE 117*  --  101*    CBG (last 3)  No results found for this basename: GLUCAP,  in the last 72 hours  Scheduled Meds: . calcium carbonate  1 tablet Oral TID AC & HS  . citalopram  10 mg  Oral Daily  . dextromethorphan-guaiFENesin  1 tablet Oral BID  . enoxaparin (LOVENOX) injection  40 mg Subcutaneous Q24H  . morphine  30 mg Oral BID    Continuous Infusions: . 0.9 % NaCl with KCl 20 mEq / L 100 mL/hr at 03/31/14 0600    Past Medical History  Diagnosis Date  . Ischemic heart disease   . Diastolic dysfunction   . Chronic anemia   . History of recurrent UTIs   . Peroneal neuropathy   . Retinopathy   . Stroke   . Hypocalcemia   . Morbid obesity     s/p gastric bypass  . Kidney stones   . Depression   . CAD s/p CABG 1994 09/25/2013    LIMA-LAD, SVG Diag, SVG-RCA, Hendrickson Cath 2010: Stents in the proximal LAD with 40% in-stent restenosis and 50% post-stent stenosis, 80% small OM 1, 70% small OM 2, 40% first PLA; diffuse 70% distal RCA; patent SVG to diagonal, patent SVG to distal  RCA, patent atretic LIMA EF 55%; normal nuclear stress test 2011  . Bilateral carotid artery occlusion 09/25/2013  . 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 Ventral hernia repair    . Appendectomy    . Vaginal hysterectomy    . Coronary artery bypass graft  1994    LIM to LAD,SVG to first diagonal,SVG to distal RCA  . Cardiac catheterization  03/30/2009    3 vessel CAD,patent grafts  . Gastric bypass  2002  . Cholecystectomy    . Heel spur excision  2012  . Trigger finger release  2012  . Radiofrequency ablation      AVNRT - successful  . Nm myocar perf wall motion  2011    no ischemia  . US echocardiography  05/09/2010    mild MR,mild mitral and aortic sclerosis    Mikey College MS, RD, LDN [phone] Pager [phone] After Hours Pager

## 2014-03-31 NOTE — Progress Notes (Signed)
PROGRESS NOTE  [name] VOH:[id number] DOB: [birth year] DOA: 03/30/2014 PCP: Antony Blackbird, MD  63 yo with h/o gastric bypass, and multiple cardiac issues, went to [place] for dental work and developed diarrhea.  Went to urgent care and was prescribed Azith.  Seemed to develop vomiting after Azith which has now resolved.  Overall diarrhea has improved  - now 4-5 loose stools per day.  O&P from Urgent Care was reportedly negative.  Patient sees Dr. Wonda Horner for n/v issues.  Last EGD in October.  Essentially normal other than possible narrowing at the gastric limbs.  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  Possible viral illness or collagenous colitis. BMs are 4-5 x a day and loose.  No abdominal pain. C-diff negative, GI Pathogen panel and O&P are pending. Patient reports she is scheduled to see Dr Paulita Fujita for Upper EGD follow up. Received gentle IVF. D/C PPI.  Substitute tums. Regular diet. If not improved 4/16 will consult GI for recommendations.  Hypocalcemia/ hypokalemia -Chronic problem related to poor absorption after gastric bypass exacerbated by recent diarrhea.  - replaced IV, recheck in AM-  - Received IV Mag as well.  Prolonged Qtc  - avoid QT prolonging meds   Bronchitis, acute  - likely viral- monitor- guaifenesin and dextromethorphan ordered   CAD s/p CABG 1994  - stable   H/O gastric bypass  - no long term complications    DVT Prophylaxis:  lovenox  Code Status: full Family Communication:  Disposition Plan: to home when appropriate.   Consultants:      Antibiotics: Anti-infectives   None      Patient denies pain, blood in her bowel movements, or current nausea / vomiting.  HPI/Subjective:   Objective: Filed Vitals:   03/30/14 1838 03/30/14 2025 03/31/14 0457 03/31/14 0613  BP: 121/77 110/69  125/78    Pulse: 94 84  82  Temp: 99.8 F (37.7 C) 99 F (37.2 C)  98.6 F (37 C)  TempSrc: Oral Oral  Oral  Resp: 16 16  16   Height:   5\' 5"  (1.651 m)   Weight: 69.809 kg (153 lb 14.4 oz)     SpO2: 98% 94%  95%    Intake/Output Summary (Last 24 hours) at 03/31/14 1556 Last data filed at 03/31/14 1000  Gross per 24 hour  Intake    240 ml  Output      0 ml  Net    240 ml   Filed Weights   03/30/14 1107 03/30/14 1838  Weight: 68.493 kg (151 lb) 69.809 kg (153 lb 14.4 oz)    Exam: General: Well developed, well nourished, NAD, appears stated age , pale, appears weak HEENT:   Anicteic Sclera, MMM. No pharyngeal erythema or exudates  Neck: Supple, no JVD, no masses  Cardiovascular: RRR, S1 S2 auscultated, no rubs, murmurs or gallops.   Respiratory: Clear to auscultation bilaterally with equal chest rise  Abdomen: Soft, nontender, nondistended, + bowel sounds  Extremities: warm dry without cyanosis clubbing or edema.  Neuro: AAOx3, cranial nerves grossly intact. Strength 5/5 in upper and lower extremities  Skin: Without rashes exudates or nodules.   Psych: Normal affect and demeanor with intact judgement and insight       Data Reviewed: Basic Metabolic Panel:  Recent Labs Lab 03/30/14 1120 03/30/14 1438 03/31/14 0630  NA 138  --  145  K 3.1*  --  3.4*  CL 101  --  107  CO2 22  --  23  GLUCOSE 117*  --  101*  BUN 9  --  7  CREATININE 0.51  --  0.53  CALCIUM 5.8*  --  6.2*  MG  --  1.5  --   PHOS  --  3.2  --    Liver Function Tests:  Recent Labs Lab 03/30/14 1120  AST 21  ALT 21  ALKPHOS 263*  BILITOT 0.5  PROT 6.7  ALBUMIN 3.1*   CBC:  Recent Labs Lab 03/30/14 1120 03/31/14 0630  WBC 10.0 7.1  NEUTROABS 8.5*  --   HGB 12.2 11.8*  HCT 35.9* 35.9*  MCV 95.2 97.8  PLT 194 185   Cardiac Enzymes:  Recent Labs Lab 03/30/14 1245  TROPONINI <0.30     Recent Results (from the past 240 hour(s))  CLOSTRIDIUM DIFFICILE BY PCR     Status: None    Collection Time    03/31/14  8:46 AM      Result Value Ref Range Status   C difficile by pcr NEGATIVE  NEGATIVE Final     Studies: Dg Chest 2 View  03/30/2014   CLINICAL DATA:  Several week history of diarrhea and vomiting with history of recent travel to [place]. Short of breath.  EXAM: CHEST  2 VIEW  COMPARISON:  Prior chest x-ray 05/05/2010  FINDINGS: Cardiac and mediastinal contours remain unchanged. Patient is status post median sternotomy with evidence of multivessel CABG including LIMA bypass. Stable central bronchitic changes and mild diffuse interstitial prominence. Negative for focal airspace consolidation, pulmonary edema, pleural effusion or pneumothorax. A metallic coronary stent projects over the heart. No suspicious pulmonary nodule. No acute osseous abnormality. Surgical clips noted in the right upper quadrant.  IMPRESSION: No active cardiopulmonary disease.   Electronically Signed   By: Jacqulynn Cadet M.D.   On: 03/30/2014 13:18   Ct Abdomen Pelvis W Contrast  03/30/2014   CLINICAL DATA:  Lower abdominal pain for the past 3 weeks. Explosive diarrhea.  EXAM: CT ABDOMEN AND PELVIS WITH CONTRAST  TECHNIQUE: Multidetector CT imaging of the abdomen and pelvis was performed using the standard protocol following bolus administration of intravenous contrast.  CONTRAST:  149mL OMNIPAQUE IOHEXOL 300 MG/ML  SOLN  COMPARISON:  CT of abdomen and pelvis 04/24/2011.  FINDINGS: Lung Bases: Small amount of ground-glass attenuation in the medial aspect of the right lower lobe may reflect sequela of recent aspiration. Borderline cardiomegaly. Status post median sternotomy. Moderate size hiatal hernia.  Abdomen/Pelvis: Status post cholecystectomy. Mild intrahepatic biliary ductal dilatation, similar to the prior study. Common bile duct is mildly dilated measuring 15 mm in the porta hepatis. This is favored to be functional biliary ductal dilatation in a postcholecystectomy patient rather than indicative  of obstruction. Small duodenal diverticulum medial to the second portion of the duodenum incidentally noted. The appearance of the pancreas, spleen, bilateral adrenal glands and bilateral kidneys is unremarkable. Atherosclerosis throughout the abdominal and pelvic vasculature, without definite aneurysm or dissection.  No significant volume of ascites. No pneumoperitoneum. No pathologic distention of small bowel. There is mural thickening involving the cecum and proximal ascending colon, suggestive of colitis. The remainder the colon does not demonstrate wall thickening. The terminal ileum is unremarkable in appearance. Surgical clips are noted throughout the small bowel mesenteries, and there postoperative changes in the anterior abdominal wall, potentially related to prior hernia repair. Status post hysterectomy. Ovaries  are not confidently identified may be surgically absent or atrophic. Urinary bladder is remarkable for a tiny locule of gas non dependently.  Musculoskeletal: Bones are osteopenic. Compared to prior study from 04/24/2011 there has been mild compression of the L5 vertebral body with approximately 10-15% loss of vertebral body height. No surrounding soft tissue swelling on today's examination to suggest an acute fracture. There are no aggressive appearing lytic or blastic lesions noted in the visualized portions of the skeleton.  IMPRESSION: 1. Mural thickening of the cecum and ascending colon suggestive of a colitis. 2. No other acute findings are identified in the abdomen or pelvis at this time. 3. Probable functional biliary ductal dilatation in a post cholecystectomy patient, as above. 4. Age indeterminate compression fracture of L5 with approximately 10-15% loss of vertebral body height. This does not appear to be acute, but is new compared to remote prior study from 04/24/2011. 5. Small locule of gas non dependently within the lumen of the urinary bladder. If the patient has been catheterized  for a recent urinalysis, this is presumably iatrogenic. In the absence of such history, clinical correlation for signs and symptoms of urinary tract infection would be recommended, as infection with gas-forming organisms could also account for this finding. 6. Moderate size hiatal hernia. 7. Additional incidental findings, as detailed above.   Electronically Signed   By: Vinnie Langton M.D.   On: 03/30/2014 18:07    Scheduled Meds: . calcium carbonate  1 tablet Oral TID AC & HS  . citalopram  10 mg Oral Daily  . dextromethorphan-guaiFENesin  1 tablet Oral BID  . enoxaparin (LOVENOX) injection  40 mg Subcutaneous Q24H  . morphine  30 mg Oral BID   Continuous Infusions: . 0.9 % NaCl with KCl 20 mEq / L 100 mL/hr at 03/31/14 0600    Principal Problem:   Diarrhea Active Problems:   CAD s/p CABG 1994   H/O gastric bypass   Hypocalcemia   Bronchitis, acute    Melton Alar, PA-C  [facility] Pager [phone]. If 7PM-7AM, please contact night-coverage at [email or web address], password [facility] 03/31/2014, 3:56 PM  LOS: 1 day

## 2014-03-31 NOTE — Progress Notes (Signed)
Addendum  Patient seen and examined, chart and data base reviewed.  I agree with the above assessment and plan.  For full details please see Mrs. Imogene Burn PA note.  Diarrhea started after patient returned from [place], stool studies pending.   Birdie Hopes, MD [facility] Pager: [phone] 03/31/2014, 5:49 PM

## 2014-03-31 NOTE — ED Provider Notes (Signed)
63 y.o. Female presents complaining of watery diarrhea.  Abdomen is soft and nontender.  Patient appears somewhat volume depleted.  K and Calcium low- Calcium being repleted iv as it is extremely low at 5.8.  QT slightly prolonged.  Labs sent for hypocalcemia work up.  PE VSS Abdomen soft and nontender.   I performed a history and physical examination of [name] and discussed her management with Ms. Eleele.  I agree with the history, physical, assessment, and plan of care, with the following exceptions: None  I was present for the following procedures: None Time Spent in Critical Care of the patient: None Time spent in discussions with the patient and family: [place], MD 03/31/14 [phone]

## 2014-03-31 NOTE — Progress Notes (Signed)
Utilization review completed.  

## 2014-04-01 ENCOUNTER — Encounter (HOSPITAL_COMMUNITY): Payer: Self-pay | Admitting: Anesthesiology

## 2014-04-01 LAB — COMPREHENSIVE METABOLIC PANEL
ALT: 22 U/L (ref 0–35)
AST: 32 U/L (ref 0–37)
Albumin: 2.6 g/dL — ABNORMAL LOW (ref 3.5–5.2)
Alkaline Phosphatase: 202 U/L — ABNORMAL HIGH (ref 39–117)
BILIRUBIN TOTAL: 0.3 mg/dL (ref 0.3–1.2)
BUN: 8 mg/dL (ref 6–23)
CHLORIDE: 103 meq/L (ref 96–112)
CO2: 19 meq/L (ref 19–32)
CREATININE: 0.57 mg/dL (ref 0.50–1.10)
Calcium: 5.7 mg/dL — CL (ref 8.4–10.5)
GFR calc Af Amer: >90 mL/min (ref >90)
Glucose, Bld: 115 mg/dL — ABNORMAL HIGH (ref 70–99)
POTASSIUM: 4 meq/L (ref 3.7–5.3)
SODIUM: 138 meq/L (ref 137–147)
Total Protein: 5.7 g/dL — ABNORMAL LOW (ref 6.0–8.3)

## 2014-04-01 LAB — OVA AND PARASITE EXAMINATION

## 2014-04-01 LAB — CBC
HCT: 31 % — ABNORMAL LOW (ref 36.0–46.0)
Hemoglobin: 10.1 g/dL — ABNORMAL LOW (ref 12.0–15.0)
MCH: 31.7 pg (ref 26.0–34.0)
MCHC: 32.6 g/dL (ref 30.0–36.0)
MCV: 97.2 fL (ref 78.0–100.0)
PLATELETS: 175 10*3/uL (ref 150–400)
RBC: 3.19 MIL/uL — ABNORMAL LOW (ref 3.87–5.11)
RDW: 15.2 % (ref 11.5–15.5)
WBC: 6.1 10*3/uL (ref 4.0–10.5)

## 2014-04-01 LAB — MAGNESIUM: Magnesium: 1.7 mg/dL (ref 1.5–2.5)

## 2014-04-01 MED ORDER — SACCHAROMYCES BOULARDII 250 MG PO CAPS
250.0000 mg | ORAL_CAPSULE | Freq: Two times a day (BID) | ORAL | Status: DC
  Administered 2014-04-01 – 2014-04-02 (×2): 250 mg via ORAL
  Filled 2014-04-01 (×4): qty 1

## 2014-04-01 MED ORDER — SODIUM CHLORIDE 0.9 % IV SOLN
1.0000 g | Freq: Once | INTRAVENOUS | Status: AC
  Administered 2014-04-01: 1 g via INTRAVENOUS
  Filled 2014-04-01: qty 10

## 2014-04-01 MED ORDER — SODIUM CHLORIDE 0.9 % IV SOLN
2.0000 g | Freq: Once | INTRAVENOUS | Status: AC
  Administered 2014-04-01: 2 g via INTRAVENOUS
  Filled 2014-04-01: qty 20

## 2014-04-01 NOTE — Care Management Note (Signed)
    Page 1 of 1   04/01/2014     4:04:16 PM   CARE MANAGEMENT NOTE 04/01/2014  Patient:  [name], [name]   Account Number:  [id number]  Date Initiated:  04/01/2014  Documentation initiated by:  Tomi Bamberger  Subjective/Objective Assessment:   dx diarrhea, chronic hypocalcemia  admit- lives alone.     Action/Plan:   pt eval- no pt f/u needed.   Anticipated DC Date:  04/02/2014   Anticipated DC Plan:  [place]  CM consult      Choice offered to / List presented to:             Status of service:  In process, will continue to follow Medicare Important Message given?   (If response is "NO", the following Medicare IM given date fields will be blank) Date Medicare IM given:   Date Additional Medicare IM given:    Discharge Disposition:    Per UR Regulation:    If discussed at Long Length of Stay Meetings, dates discussed:    Comments:

## 2014-04-01 NOTE — Evaluation (Signed)
Physical Therapy Evaluation Patient Details Name: ESBEYDI MANAGO MRN: [id number] DOB: 1951-09-22 Today's Date: 04/01/2014   History of Present Illness  pt presents diarrhea.    Clinical Impression  Pt moving at baseline level per pt.  Moves slowly, but no deficits or LOB.  No further PT needs at this time.  Will sign off.      Follow Up Recommendations No PT follow up    Equipment Recommendations  None recommended by PT    Recommendations for Other Services       Precautions / Restrictions Precautions Precautions: None Restrictions Weight Bearing Restrictions: No      Mobility  Bed Mobility                  Transfers Overall transfer level: Modified independent Equipment used: None                Ambulation/Gait Ambulation/Gait assistance: Modified independent (Device/Increase time) Ambulation Distance (Feet): 200 Feet Assistive device: Straight cane Gait Pattern/deviations: Step-through pattern;Decreased stride length   Gait velocity interpretation: Below normal speed for age/gender General Gait Details: pt moves slowly, but demos good use of her cane.  pt fatigues easily, but pt states this is normal for her.    Stairs            Wheelchair Mobility    Modified Rankin (Stroke Patients Only)       Balance                                             Pertinent Vitals/Pain Indicates chronic back pain with ambulation.      Home Living Family/patient expects to be discharged to:: Private residence Living Arrangements: Alone Available Help at Discharge: Family;Available PRN/intermittently Type of Home: House Home Access: Stairs to enter Entrance Stairs-Rails: None Entrance Stairs-Number of Steps: 1 Home Layout: One level Home Equipment: Cane - single point      Prior Function Level of Independence: Independent with assistive device(s)               Hand Dominance        Extremity/Trunk Assessment    Upper Extremity Assessment: Overall WFL for tasks assessed           Lower Extremity Assessment: Overall WFL for tasks assessed         Communication   Communication: No difficulties  Cognition Arousal/Alertness: Awake/alert Behavior During Therapy: WFL for tasks assessed/performed Overall Cognitive Status: Within Functional Limits for tasks assessed                      General Comments      Exercises        Assessment/Plan    PT Assessment Patent does not need any further PT services  PT Diagnosis     PT Problem List    PT Treatment Interventions     PT Goals (Current goals can be found in the Care Plan section) Acute Rehab PT Goals PT Goal Formulation: No goals set, d/c therapy    Frequency     Barriers to discharge        Co-evaluation               End of Session Equipment Utilized During Treatment: Gait belt Activity Tolerance: Patient tolerated treatment well Patient left: in chair;with call bell/phone within reach Nurse  Communication: Mobility status         Time: 1138-1201 PT Time Calculation (min): 23 min   Charges:   PT Evaluation $Initial PT Evaluation Tier I: 1 Procedure PT Treatments $Gait Training: 8-22 mins   PT G CodesCatarina Hartshorn, PT [phone] 04/01/2014, 1:02 PM

## 2014-04-01 NOTE — Progress Notes (Signed)
Addendum  Patient seen and examined, chart and data base reviewed.  I agree with the above assessment and plan.  For full details please see Mrs. Imogene Burn PA note.  Diarrhea, improving. Seen by [facility], Dr. Cristina Gong recommended to advance diet and obtain colonoscopy as outpatient.   Birdie Hopes, MD [facility] Pager: [phone] 04/01/2014, 5:19 PM

## 2014-04-01 NOTE — Progress Notes (Signed)
PROGRESS NOTE  [name] EUM:[id number] DOB: 01/31/1951 DOA: 03/30/2014 PCP: Antony Blackbird, MD  63 yo with h/o gastric bypass, and multiple cardiac issues, went to [place] for dental work and developed diarrhea.  Went to urgent care and was prescribed Azith.  Seemed to develop vomiting after Azith which has now resolved.  Overall diarrhea has improved  - now 4-5 loose stools per day.  O&P from Urgent Care was reportedly negative.  Patient sees Dr. Wonda Horner for n/v issues.  Last EGD in October.  Essentially normal other than possible narrowing at the gastric limbs.  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   Assessment/Plan:  Diarrhea  Concerned for [place] spr[place] given recent travel.  Possible viral illness or collagenous colitis. BMs are still 4-5x daily but more solid than previous days .  No abdominal pain. C-diff negative, GI Pathogen panel and O&P are negative Patient reports she is scheduled to see Dr Paulita Fujita for Upper EGD follow up. Received gentle IVF. D/C PPI.  Substitute tums. [facility] consulted. CT scan showed thickened in the cecum and ascending colon. Currently on clears.  Hypocalcemia/ hypokalemia -Chronic problem related to poor absorption after gastric bypass exacerbated by recent diarrhea.  -corrected calcium is 6.58 on 04/01/2014 -  2 gm of calcium was given 4/16.  She is near her baseline. -PTH is elevated at 292.  Prolonged Qtc  - avoid QT prolonging meds   Bronchitis, acute  - likely viral- monitor- guaifenesin and dextromethorphan ordered   CAD s/p CABG 1994  - stable   H/O gastric bypass  - no long term complications    DVT Prophylaxis:  lovenox  Code Status: full Family Communication:  Disposition Plan: to home when appropriate.   Consultants:  Gertie Fey   Antibiotics: Anti-infectives   None      Patient denies pain, blood in her bowel movements, or current  nausea / vomiting.  HPI/Subjective:   Objective: Filed Vitals:   03/31/14 0613 03/31/14 2114 04/01/14 0544 04/01/14 1433  BP: 125/78 131/75 106/67 126/73  Pulse: 82 69 72 64  Temp: 98.6 F (37 C) 98.7 F (37.1 C) 98.2 F (36.8 C) 98.3 F (36.8 C)  TempSrc: Oral Oral Oral Oral  Resp: 16 18 18 18   Height:      Weight:      SpO2: 95% 97% 96% 98%   No intake or output data in the 24 hours ending 04/01/14 1525 Filed Weights   03/30/14 1107 03/30/14 1838  Weight: 68.493 kg (151 lb) 69.809 kg (153 lb 14.4 oz)    Exam: General: Well developed, well nourished, NAD, appears stated age,  HEENT:   Anicteic Sclera, MMM. No pharyngeal erythema or exudates  Neck: Supple, no JVD, no masses  Cardiovascular: RRR, S1 S2 auscultated, no rubs, murmurs or gallops.   Respiratory: Clear to auscultation bilaterally with equal chest rise  Abdomen: Soft, nontender, nondistended, + bowel sounds  Extremities: warm dry without cyanosis clubbing or edema.  Neuro: AAOx3, cranial nerves grossly intact. Strength 5/5 in upper and lower extremities  Skin: Without rashes exudates or nodules.   Psych: Normal affect and demeanor with intact judgement and insight    Data Reviewed: Basic Metabolic Panel:  Recent Labs Lab 03/30/14 1120 03/30/14 1438 03/31/14 0630 04/01/14 0347  NA 138  --  145 138  K 3.1*  --  3.4* 4.0  CL 101  --  107 103  CO2 22  --  23 19  GLUCOSE 117*  --  101* 115*  BUN 9  --  7 8  CREATININE 0.51  --  0.53 0.57  CALCIUM 5.8*  --  6.2* 5.7*  MG  --  1.5  --  1.7  PHOS  --  3.2  --   --    Liver Function Tests:  Recent Labs Lab 03/30/14 1120 04/01/14 0347  AST 21 32  ALT 21 22  ALKPHOS 263* 202*  BILITOT 0.5 0.3  PROT 6.7 5.7*  ALBUMIN 3.1* 2.6*   CBC:  Recent Labs Lab 03/30/14 1120 03/31/14 0630 04/01/14 0347  WBC 10.0 7.1 6.1  NEUTROABS 8.5*  --   --   HGB 12.2 11.8* 10.1*  HCT 35.9* 35.9* 31.0*  MCV 95.2 97.8 97.2  PLT 194 185 175   Cardiac  Enzymes:  Recent Labs Lab 03/30/14 1245  TROPONINI <0.30     Recent Results (from the past 240 hour(s))  OVA AND PARASITE EXAMINATION     Status: None   Collection Time    03/31/14  2:42 AM      Result Value Ref Range Status   Specimen Description STOOL   Final   Special Requests NONE   Final   Ova and parasites     Final   Value: NO OVA OR PARASITES SEEN ABUNDANT YEAST     Performed at [facility]   Report Status 04/01/2014 FINAL   Final  CLOSTRIDIUM DIFFICILE BY PCR     Status: None   Collection Time    03/31/14  8:46 AM      Result Value Ref Range Status   C difficile by pcr NEGATIVE  NEGATIVE Final     Studies: Ct Abdomen Pelvis W Contrast  03/30/2014   CLINICAL DATA:  Lower abdominal pain for the past 3 weeks. Explosive diarrhea.  EXAM: CT ABDOMEN AND PELVIS WITH CONTRAST  TECHNIQUE: Multidetector CT imaging of the abdomen and pelvis was performed using the standard protocol following bolus administration of intravenous contrast.  CONTRAST:  182mL OMNIPAQUE IOHEXOL 300 MG/ML  SOLN  COMPARISON:  CT of abdomen and pelvis 04/24/2011.  FINDINGS: Lung Bases: Small amount of ground-glass attenuation in the medial aspect of the right lower lobe may reflect sequela of recent aspiration. Borderline cardiomegaly. Status post median sternotomy. Moderate size hiatal hernia.  Abdomen/Pelvis: Status post cholecystectomy. Mild intrahepatic biliary ductal dilatation, similar to the prior study. Common bile duct is mildly dilated measuring 15 mm in the porta hepatis. This is favored to be functional biliary ductal dilatation in a postcholecystectomy patient rather than indicative of obstruction. Small duodenal diverticulum medial to the second portion of the duodenum incidentally noted. The appearance of the pancreas, spleen, bilateral adrenal glands and bilateral kidneys is unremarkable. Atherosclerosis throughout the abdominal and pelvic vasculature, without definite aneurysm or  dissection.  No significant volume of ascites. No pneumoperitoneum. No pathologic distention of small bowel. There is mural thickening involving the cecum and proximal ascending colon, suggestive of colitis. The remainder the colon does not demonstrate wall thickening. The terminal ileum is unremarkable in appearance. Surgical clips are noted throughout the small bowel mesenteries, and there postoperative changes in the anterior abdominal wall, potentially related to prior hernia repair. Status post hysterectomy. Ovaries are not confidently identified may be surgically absent or atrophic. Urinary bladder is remarkable for a tiny locule of gas non dependently.  Musculoskeletal: Bones are osteopenic. Compared to prior study from 04/24/2011 there has been mild compression of  the L5 vertebral body with approximately 10-15% loss of vertebral body height. No surrounding soft tissue swelling on today's examination to suggest an acute fracture. There are no aggressive appearing lytic or blastic lesions noted in the visualized portions of the skeleton.  IMPRESSION: 1. Mural thickening of the cecum and ascending colon suggestive of a colitis. 2. No other acute findings are identified in the abdomen or pelvis at this time. 3. Probable functional biliary ductal dilatation in a post cholecystectomy patient, as above. 4. Age indeterminate compression fracture of L5 with approximately 10-15% loss of vertebral body height. This does not appear to be acute, but is new compared to remote prior study from 04/24/2011. 5. Small locule of gas non dependently within the lumen of the urinary bladder. If the patient has been catheterized for a recent urinalysis, this is presumably iatrogenic. In the absence of such history, clinical correlation for signs and symptoms of urinary tract infection would be recommended, as infection with gas-forming organisms could also account for this finding. 6. Moderate size hiatal hernia. 7. Additional  incidental findings, as detailed above.   Electronically Signed   By: Vinnie Langton M.D.   On: 03/30/2014 18:07    Scheduled Meds: . calcium carbonate  400 mg of elemental calcium Oral TID AC & HS  . citalopram  10 mg Oral Daily  . dextromethorphan-guaiFENesin  1 tablet Oral BID  . enoxaparin (LOVENOX) injection  40 mg Subcutaneous Q24H  . morphine  30 mg Oral BID   Continuous Infusions: . 0.9 % NaCl with KCl 20 mEq / L 100 mL/hr at 04/01/14 9323    Principal Problem:   Diarrhea Active Problems:   CAD s/p CABG 1994   H/O gastric bypass   Hypocalcemia   Bronchitis, acute   Protein-calorie malnutrition, severe   [name], Student-PA Melton Alar, [place]  [facility] Pager [phone]. If 7PM-7AM, please contact night-coverage at [email or web address], password [facility] 04/01/2014, 3:25 PM  LOS: 2 days

## 2014-04-01 NOTE — Progress Notes (Signed)
CRITICAL VALUE ALERT  Critical value received:  Ca 2+ 5.7  Date of notification:  04/01/14   Time of notification:  0525  Critical value read back:yes  Nurse who received alert:  Evonte Prestage, RN   MD notified (1st page):  Baltazar Najjar, NP  Time of first page:  0525  MD notified (2nd page):  Time of second page:  Responding MD:  Baltazar Najjar, NP  Time MD responded:  [phone] , orders given.

## 2014-04-01 NOTE — Consult Note (Signed)
Referring Provider: Dr. Verlee Monte   Primary Care Physician:  Antony Blackbird, MD Primary Gastroenterologist:  Dr. Paulita Fujita  Reason for Consultation:  Diarrhea, abnormal CT scan  HPI: [name] is a 63 y.o. female with a 4 week history of diarrhea, admitted to the hospital 2 days ago for that reason. It began when she came back from [place], having visited there for the purpose of extensive dental work.  At its worst, the patient was having about 6 bowel movements a day which would be explosive, and frequently associated with incontinence. No problem with fevers, bleeding, or severe abdominal cramps, but she did have some degree of nocturnal diarrhea, and significant weight loss.  A parasite study through the urgent care center where she was seen recently was reportedly negative. She was treated briefly with his azithromycin, which led to nausea and vomiting. Since going off that medication, the nausea and vomiting have resolved.  However, in the past 48 hours or so since coming into the hospital, the patient seems to have turn the corner. She says she is close to 100% better. She is tolerating solid food.  Meanwhile, the patient had a CT of the abdomen and pelvis which showed some apparent thickening of the cecum and proximal colon.  Patient has been afebrile. Her white count has been normal. A GI pathogens panel came back completely negative, including Clostridium difficile. I do not see a fecal occult blood test.  The patient indicates that it has been about 10 years since her last colonoscopy, which was apparently negative. There is no family history of colon cancer.  The patient is status post bariatric surgery and has been endoscoped by Dr. Paulita Fujita in the past. She has chronic back pain and is chronically on morphine.  She also reports a recent bronchitis problem.      Past Medical History  Diagnosis Date  . Ischemic heart disease   . Diastolic dysfunction   . Chronic anemia    . History of recurrent UTIs   . Peroneal neuropathy   . Retinopathy   . Stroke   . Hypocalcemia   . Morbid obesity     s/p gastric bypass  . Kidney stones   . Depression   . CAD s/p CABG 1994 09/25/2013    LIMA-LAD, SVG Diag, SVG-RCA, [facility] 2010: Stents in the proximal LAD with 40% in-stent restenosis and 50% post-stent stenosis, 80% small OM 1, 70% small OM 2, 40% first PLA; diffuse 70% distal RCA; patent SVG to diagonal, patent SVG to distal RCA, patent atretic LIMA EF 55%; normal nuclear stress test 2011  . Bilateral carotid artery occlusion 09/25/2013  . 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 Ventral hernia repair    . Appendectomy    . Vaginal hysterectomy    . Coronary artery bypass graft  1994    LIM to LAD,SVG to first diagonal,SVG to distal RCA  . Cardiac catheterization  03/30/2009    3 vessel CAD,patent grafts  . Gastric bypass  2002  . Cholecystectomy    . Heel spur excision  2012  . Trigger finger release  2012  . Radiofrequency ablation      AVNRT - successful  . Nm myocar perf wall motion  2011    no ischemia  . US echocardiography  05/09/2010    mild MR,mild mitral and aortic sclerosis    Prior to Admission medications   Medication Sig  Start Date End Date Taking? Authorizing Provider  acetaminophen (TYLENOL) 500 MG tablet Take 1,500 mg by mouth every 6 (six) hours as needed for mild pain.    Yes Historical Provider, MD  citalopram (CELEXA) 10 MG tablet Take 10 mg by mouth daily.   Yes Historical Provider, MD  Cyanocobalamin (VITAMIN B-12) 1000 MCG SUBL Place under the tongue as needed.   Yes Historical Provider, MD  ERY-TAB 250 MG EC tablet Take 250 mg by mouth daily.  03/24/13  Yes Historical Provider, MD  furosemide (LASIX) 20 MG tablet Take 20 mg by mouth as needed for fluid.    Yes Historical Provider, MD  morphine (MS CONTIN) 30 MG 12 hr tablet Take 30 mg by mouth 2 (two) times daily.  q 12 hours   Yes Historical Provider, MD  NEXIUM 40 MG capsule Take 40 mg by mouth 2 (two) times daily. 09/07/13  Yes Historical Provider, MD  nitroGLYCERIN (NITROLINGUAL) 0.4 MG/SPRAY spray Place 1 spray under the tongue every 5 (five) minutes as needed for chest pain.   Yes Historical Provider, MD  ondansetron (ZOFRAN) 4 MG tablet Take 4 mg by mouth 2 (two) times daily.  03/17/13  Yes Historical Provider, MD    Current Facility-Administered Medications  Medication Dose Route Frequency Provider Last Rate Last Dose  . 0.9 % NaCl with KCl 20 mEq/ L  infusion   Intravenous Continuous Debbe Odea, MD 100 mL/hr at 04/01/14 0242    . calcium carbonate (TUMS - dosed in mg elemental calcium) chewable tablet 400 mg of elemental calcium  400 mg of elemental calcium Oral TID AC & HS Verlee Monte, MD   400 mg of elemental calcium at 04/01/14 1032  . citalopram (CELEXA) tablet 10 mg  10 mg Oral Daily Debbe Odea, MD   10 mg at 04/01/14 1032  . dextromethorphan-guaiFENesin (MUCINEX DM) 30-600 MG per 12 hr tablet 1 tablet  1 tablet Oral BID Debbe Odea, MD   1 tablet at 04/01/14 1032  . enoxaparin (LOVENOX) injection 40 mg  40 mg Subcutaneous Q24H Debbe Odea, MD   40 mg at 03/31/14 2112  . HYDROcodone-acetaminophen (NORCO/VICODIN) 5-325 MG per tablet 1-2 tablet  1-2 tablet Oral Q4H PRN Debbe Odea, MD   2 tablet at 04/01/14 1036  . morphine (MS CONTIN) 12 hr tablet 30 mg  30 mg Oral BID Verlee Monte, MD   30 mg at 04/01/14 1101  . promethazine (PHENERGAN) tablet 12.5 mg  12.5 mg Oral Q6H PRN Debbe Odea, MD        Allergies as of 03/30/2014 - Review Complete 03/30/2014  Allergen Reaction Noted  . Latex  03/30/2014  . Metoclopramide hcl  04/08/2012  . Neurontin [gabapentin]  04/08/2012  . Penicillins  04/08/2012    Family History  Problem Relation Age of Onset  . Diabetes Brother   . Heart attack Sister   . Hypertension    . Asthma Brother   . Cancer Father   . Diabetes Mother   . Diabetes  Father   . Heart failure Mother     History   Social History  . Marital Status: Divorced    Spouse Name: N/A    Number of Children: 2  . Years of Education: N/A   Occupational History  . disabled    Social History Main Topics  . Smoking status: Former Smoker    Quit date: 12/18/1987  . Smokeless tobacco: Not on file  . Alcohol Use: No  . Drug  Use: No  . Sexual Activity: Not on file   Other Topics Concern  . Not on file   Social History Narrative  . No narrative on file    Review of Systems: Pertinent per history of present illness  Physical Exam: Vital signs in last 24 hours: Temp:  [98.2 F (36.8 C)-98.7 F (37.1 C)] 98.3 F (36.8 C) (04/16 1433) Pulse Rate:  [64-72] 64 (04/16 1433) Resp:  [18] 18 (04/16 1433) BP: (106-131)/(67-75) 126/73 mmHg (04/16 1433) SpO2:  [96 %-98 %] 98 % (04/16 1433) Last BM Date: 04/01/14 This is a pleasant and fairly healthy-appearing female who is not in any acute distress. She is anicteric and without overt pallor. The chest has diffuse inspiratory and expiratory wheezes (no history of COPD). Heart unremarkable. Abdomen overtly nontender, without guarding or overt mass effect. Oropharynx benign. Neurologically grossly intact  Intake/Output from previous day: 04/15 0701 - 04/16 0700 In: 240 [P.O.:240] Out: -  Intake/Output this shift:    Lab Results:  Recent Labs  03/30/14 1120 03/31/14 0630 04/01/14 0347  WBC 10.0 7.1 6.1  HGB 12.2 11.8* 10.1*  HCT 35.9* 35.9* 31.0*  PLT 194 185 175   BMET  Recent Labs  03/30/14 1120 03/31/14 0630 04/01/14 0347  NA 138 145 138  K 3.1* 3.4* 4.0  CL 101 107 103  CO2 22 23 19   GLUCOSE 117* 101* 115*  BUN 9 7 8   CREATININE 0.51 0.53 0.57  CALCIUM 5.8* 6.2* 5.7*   LFT  Recent Labs  04/01/14 0347  PROT 5.7*  ALBUMIN 2.6*  AST 32  ALT 22  ALKPHOS 202*  BILITOT 0.3   PT/INR No results found for this basename: LABPROT, INR,  in the last 72 hours  Studies/Results: Ct  Abdomen Pelvis W Contrast  03/30/2014   CLINICAL DATA:  Lower abdominal pain for the past 3 weeks. Explosive diarrhea.  EXAM: CT ABDOMEN AND PELVIS WITH CONTRAST  TECHNIQUE: Multidetector CT imaging of the abdomen and pelvis was performed using the standard protocol following bolus administration of intravenous contrast.  CONTRAST:  176mL OMNIPAQUE IOHEXOL 300 MG/ML  SOLN  COMPARISON:  CT of abdomen and pelvis 04/24/2011.  FINDINGS: Lung Bases: Small amount of ground-glass attenuation in the medial aspect of the right lower lobe may reflect sequela of recent aspiration. Borderline cardiomegaly. Status post median sternotomy. Moderate size hiatal hernia.  Abdomen/Pelvis: Status post cholecystectomy. Mild intrahepatic biliary ductal dilatation, similar to the prior study. Common bile duct is mildly dilated measuring 15 mm in the porta hepatis. This is favored to be functional biliary ductal dilatation in a postcholecystectomy patient rather than indicative of obstruction. Small duodenal diverticulum medial to the second portion of the duodenum incidentally noted. The appearance of the pancreas, spleen, bilateral adrenal glands and bilateral kidneys is unremarkable. Atherosclerosis throughout the abdominal and pelvic vasculature, without definite aneurysm or dissection.  No significant volume of ascites. No pneumoperitoneum. No pathologic distention of small bowel. There is mural thickening involving the cecum and proximal ascending colon, suggestive of colitis. The remainder the colon does not demonstrate wall thickening. The terminal ileum is unremarkable in appearance. Surgical clips are noted throughout the small bowel mesenteries, and there postoperative changes in the anterior abdominal wall, potentially related to prior hernia repair. Status post hysterectomy. Ovaries are not confidently identified may be surgically absent or atrophic. Urinary bladder is remarkable for a tiny locule of gas non dependently.   Musculoskeletal: Bones are osteopenic. Compared to prior study from 04/24/2011 there has  been mild compression of the L5 vertebral body with approximately 10-15% loss of vertebral body height. No surrounding soft tissue swelling on today's examination to suggest an acute fracture. There are no aggressive appearing lytic or blastic lesions noted in the visualized portions of the skeleton.  IMPRESSION: 1. Mural thickening of the cecum and ascending colon suggestive of a colitis. 2. No other acute findings are identified in the abdomen or pelvis at this time. 3. Probable functional biliary ductal dilatation in a post cholecystectomy patient, as above. 4. Age indeterminate compression fracture of L5 with approximately 10-15% loss of vertebral body height. This does not appear to be acute, but is new compared to remote prior study from 04/24/2011. 5. Small locule of gas non dependently within the lumen of the urinary bladder. If the patient has been catheterized for a recent urinalysis, this is presumably iatrogenic. In the absence of such history, clinical correlation for signs and symptoms of urinary tract infection would be recommended, as infection with gas-forming organisms could also account for this finding. 6. Moderate size hiatal hernia. 7. Additional incidental findings, as detailed above.   Electronically Signed   By: Vinnie Langton M.D.   On: 03/30/2014 18:07    Impression: 1. Persistent diarrhea of approximately one month's duration following foreign travel, now substantially improved 2. Equivocal finding of colitis (CT shows mural thickening in the cecum and proximal descending colon, without mention of inflammation elsewhere, or pericolonic inflammatory changes). 3. Current coughing and wheezing, suggestive of bronchitis 4. Overdue for screening colonoscopy 5. Status post bariatric surgery, chronic back pain on chronic narcotic analgesics  Plan: I recommended that we hold off on colonoscopy  for the time being.   I do believe this patient should have colonoscopy, primarily for screening purposes, since it has been a long time since she last had one.   However, I do not feel that colonoscopic evaluation of the colon is urgently needed, because I doubt that the CT findings are clinically significant (they probably are reflective of underdistention of that section of the colon, based on my review of the images).  Moreover, with the patient's diarrhea improving, I doubt that the colonoscopy would shed light on the etiology of her diarrhea, which is almost certainly infectious in origin anyway. Given her improvement in the past few days, I doubt, for example, that she has a primary intestinal inflammatory disorder such as collagenous colitis.  In fact, I am concerned that if we attempted to do a colonoscopy at this time, he could actually set her back, by upsetting the normal intestinal flora that is trying to reestablish itself following this prolonged diarrheal illness.  Therefore, I have made the patient an appointment with her primary gastroenterologist, Dr. Wonda Horner, for May 12 at 1:30 PM, at which time he can get an update on her current status and consider arranging a colonoscopy for screening purposes at that time.  I would favor treating the patient with probiotic therapy for the next couple of months, to help reduce the risk of postinfectious IBS. Here in the hospital I will use [employer], but following discharge, I would probably favor the use of Align once daily for 2 months.   LOS: 2 days   [name]  04/01/2014, 4:47 PM

## 2014-04-02 ENCOUNTER — Encounter (HOSPITAL_COMMUNITY)
Admission: EM | Disposition: A | Discharge status: Home / Self Care | Payer: Self-pay | Source: Home / Self Care | Attending: Internal Medicine

## 2014-04-02 DIAGNOSIS — E876 Hypokalemia: Secondary | ICD-10-CM

## 2014-04-02 LAB — COMPREHENSIVE METABOLIC PANEL
ALT: 30 U/L (ref 0–35)
AST: 37 U/L (ref 0–37)
Albumin: 2.9 g/dL — ABNORMAL LOW (ref 3.5–5.2)
Alkaline Phosphatase: 237 U/L — ABNORMAL HIGH (ref 39–117)
BILIRUBIN TOTAL: 0.3 mg/dL (ref 0.3–1.2)
BUN: 11 mg/dL (ref 6–23)
CALCIUM: 6.7 mg/dL — AB (ref 8.4–10.5)
CHLORIDE: 108 meq/L (ref 96–112)
CO2: 21 meq/L (ref 19–32)
CREATININE: 0.51 mg/dL (ref 0.50–1.10)
GLUCOSE: 104 mg/dL — AB (ref 70–99)
Potassium: 4.9 mEq/L (ref 3.7–5.3)
Sodium: 140 mEq/L (ref 137–147)
Total Protein: 6.4 g/dL (ref 6.0–8.3)

## 2014-04-02 SURGERY — COLONOSCOPY
Anesthesia: Monitor Anesthesia Care

## 2014-04-02 MED ORDER — DM-GUAIFENESIN ER 30-600 MG PO TB12: 1.0000 | ORAL_TABLET | Freq: Two times a day (BID) | ORAL | Status: DC

## 2014-04-02 MED ORDER — HYDROCODONE-ACETAMINOPHEN 5-325 MG PO TABS
1.0000 | ORAL_TABLET | Freq: Once | ORAL | Status: AC
  Administered 2014-04-02: 1 via ORAL
  Filled 2014-04-02: qty 1

## 2014-04-02 MED ORDER — SACCHAROMYCES BOULARDII 250 MG PO CAPS: 250.0000 mg | ORAL_CAPSULE | Freq: Two times a day (BID) | ORAL | Status: DC

## 2014-04-02 MED ORDER — CALCIUM CARBONATE ANTACID 500 MG PO CHEW: 400.0000 mg | CHEWABLE_TABLET | Freq: Three times a day (TID) | ORAL | Status: DC

## 2014-04-02 NOTE — Discharge Summary (Signed)
Physician Discharge Summary  [name]:[id number] DOB: 04-23-1951 DOA: 03/30/2014  PCP: Antony Blackbird, MD  Admit date: 03/30/2014 Discharge date: 04/02/2014  Time spent: 50 minutes  Recommendations for Outpatient Follow-up:  1. [facility] 5/12 for consideration of colonoscopy and follow up for colitis on CT scan. 2. Follow up with PCP for continued management of chronic electrolyte abnormalities. 3. [facility] in 1 week.  Discharge Diagnoses:  Principal Problem:   Diarrhea Active Problems:   CAD s/p CABG 1994   H/O gastric bypass   Hypocalcemia   Bronchitis, acute   Protein-calorie malnutrition, severe   Discharge Condition: stable.  Diet recommendation: as tolerated.  Filed Weights   03/30/14 1107 03/30/14 1838  Weight: 68.493 kg (151 lb) 69.809 kg (153 lb 14.4 oz)    History of present illness at the time of admission:  63 yo female whose past medical hx includes gastric bypass and problems with mal absorption.  She developed diarrhea (3/18) upon returning from [place] where she had dental work done.  At its worst, she was having about 6 bowel movements a day which would be explosive, and frequently associated with incontinence. No problem with fevers, bleeding, or severe abdominal cramps.  She had some nocturnal diarrhea, and significant weight loss (19 lbs reported).  She received antibiotics from Urgent care which were subsequently discontinued as they caused vomiting.  O&P from the urgent care is reportedly negative.  She presented to the ED because of continued diarrhea, incontinence and severe myalgias.  CT scan on admission showed thickening of the ascending colon and cecum.  Hospital Course:   Diarrhea  Possible viral illness compounded by chronic electrolyte abnormalities. CT scan showed thickened in the cecum and ascending colon.  BMs are now 2-3 x daily and more solid than previous days . No abdominal pain.  C-diff negative, GI Pathogen panel and  O&P are negative  Received gentle IVF. Discontinued PPI. Substituted tums.  [facility] consulted.  Appreciate Dr. Osborn Coho time and recommendations D/C on probiotic BID.  Hypocalcemia/ hypokalemia  -Chronic problem related to poor absorption after gastric bypass exacerbated by recent diarrhea.  -corrected calcium is 6.58 on 04/01/2014.  (near her baseline).   -she received IV calcium gluconate during this admission.  -PTH is elevated at 292.   Prolonged Qtc  - avoid QT prolonging meds   Bronchitis, acute  - likely viral- monitor- guaifenesin and dextromethorphan given and recommended post d/c.  CAD s/p CABG 1994  - stable   H/O gastric bypass  - no long term complications    Consultations:  [facility]  Discharge Exam: Filed Vitals:   04/02/14 0430  BP: 151/80  Pulse: 74  Temp: 98.5 F (36.9 C)  Resp: 18   General: Well developed, well nourished, NAD, appears stated age, pale. HEENT: Anicteic Sclera, MMM. No pharyngeal erythema or exudates  Neck: Supple, no JVD, no masses  Cardiovascular: RRR, S1 S2 auscultated, no rubs, murmurs or gallops.  Respiratory: Clear to auscultation bilaterally with equal chest rise  Abdomen: Soft, nontender, nondistended, + bowel sounds  Extremities: warm dry without cyanosis clubbing or edema.  Neuro: AAOx3, cranial nerves grossly intact. Strength 5/5 in upper and lower extremities  Skin: Without rashes exudates or nodules.  Psych: Normal affect and demeanor with intact judgement and insight   Discharge Instructions       Discharge Orders   Future Appointments Provider Department Dept Phone   04/14/2014 2:30 PM [facility]  [phone]   04/14/2014 3:00 PM Gordy Levan, MD [facility] [phone]   Future Orders Complete By Expires   Diet - low sodium heart healthy  As directed    Increase activity slowly  As directed         Medication List    STOP taking these medications       NEXIUM 40 MG capsule  Generic drug:  esomeprazole      TAKE these medications       acetaminophen 500 MG tablet  Commonly known as:  TYLENOL  Take 1,500 mg by mouth every 6 (six) hours as needed for mild pain.     calcium carbonate 500 MG chewable tablet  Commonly known as:  TUMS - dosed in mg elemental calcium  Chew 2 tablets (400 mg of elemental calcium total) by mouth 4 (four) times daily -  before meals and at bedtime.     citalopram 10 MG tablet  Commonly known as:  CELEXA  Take 10 mg by mouth daily.     dextromethorphan-guaiFENesin 30-600 MG per 12 hr tablet  Commonly known as:  MUCINEX DM  Take 1 tablet by mouth 2 (two) times daily.     ERY-TAB 250 MG EC tablet  Generic drug:  erythromycin  Take 250 mg by mouth daily.     furosemide 20 MG tablet  Commonly known as:  LASIX  Take 20 mg by mouth as needed for fluid.     morphine 30 MG 12 hr tablet  Commonly known as:  MS CONTIN  Take 30 mg by mouth 2 (two) times daily. q 12 hours     nitroGLYCERIN 0.4 MG/SPRAY spray  Commonly known as:  NITROLINGUAL  Place 1 spray under the tongue every 5 (five) minutes as needed for chest pain.     ondansetron 4 MG tablet  Commonly known as:  ZOFRAN  Take 4 mg by mouth 2 (two) times daily.     saccharomyces boulardii 250 MG capsule  Commonly known as:  FLORASTOR  Take 1 capsule (250 mg total) by mouth 2 (two) times daily.     Vitamin B-12 1000 MCG Subl  Place under the tongue as needed.       Allergies  Allergen Reactions  . Latex   . Metoclopramide Hcl   . Neurontin [Gabapentin]   . Penicillins    Follow-up Information   Follow up with FULP, CAMMIE, MD. Schedule an appointment as soon as possible for a visit in 1 week.   Specialty:  Family Medicine   Contact information:   [place] [address] [phone]       Follow up with Landry Dyke, MD On 04/27/2014. (1:30  pm.)    Specialty:  Gastroenterology   Contact information:   [address], [place] [address] [phone]        The results of significant diagnostics from this hospitalization (including imaging, microbiology, ancillary and laboratory) are listed below for reference.    Significant Diagnostic Studies: Dg Chest 2 View  03/30/2014   CLINICAL DATA:  Several week history of diarrhea and vomiting with history of recent travel to [place]. Short of breath.  EXAM: CHEST  2 VIEW  COMPARISON:  Prior chest x-ray 05/05/2010  FINDINGS: Cardiac and mediastinal contours remain unchanged. Patient is status post median sternotomy with evidence of multivessel CABG including LIMA bypass. Stable central bronchitic changes and mild diffuse interstitial prominence.  Negative for focal airspace consolidation, pulmonary edema, pleural effusion or pneumothorax. A metallic coronary stent projects over the heart. No suspicious pulmonary nodule. No acute osseous abnormality. Surgical clips noted in the right upper quadrant.  IMPRESSION: No active cardiopulmonary disease.   Electronically Signed   By: Malachy Moan M.D.   On: 03/30/2014 13:18   Ct Abdomen Pelvis W Contrast  03/30/2014   CLINICAL DATA:  Lower abdominal pain for the past 3 weeks. Explosive diarrhea.  EXAM: CT ABDOMEN AND PELVIS WITH CONTRAST  TECHNIQUE: Multidetector CT imaging of the abdomen and pelvis was performed using the standard protocol following bolus administration of intravenous contrast.  CONTRAST:  OMNIPAQUE IOHEXOL 300 MG/ML  SOLN  COMPARISON:  CT of abdomen and pelvis 04/24/2011.  FINDINGS: Lung Bases: Small amount of ground-glass attenuation in the medial aspect of the right lower lobe may reflect sequela of recent aspiration. Borderline cardiomegaly. Status post median sternotomy. Moderate size hiatal hernia.  Abdomen/Pelvis: Status post cholecystectomy. Mild intrahepatic biliary ductal dilatation, similar to the  prior study. Common bile duct is mildly dilated measuring 15 mm in the porta hepatis. This is favored to be functional biliary ductal dilatation in a postcholecystectomy patient rather than indicative of obstruction. Small duodenal diverticulum medial to the second portion of the duodenum incidentally noted. The appearance of the pancreas, spleen, bilateral adrenal glands and bilateral kidneys is unremarkable. Atherosclerosis throughout the abdominal and pelvic vasculature, without definite aneurysm or dissection.  No significant volume of ascites. No pneumoperitoneum. No pathologic distention of small bowel. There is mural thickening involving the cecum and proximal ascending colon, suggestive of colitis. The remainder the colon does not demonstrate wall thickening. The terminal ileum is unremarkable in appearance. Surgical clips are noted throughout the small bowel mesenteries, and there postoperative changes in the anterior abdominal wall, potentially related to prior hernia repair. Status post hysterectomy. Ovaries are not confidently identified may be surgically absent or atrophic. Urinary bladder is remarkable for a tiny locule of gas non dependently.  Musculoskeletal: Bones are osteopenic. Compared to prior study from 04/24/2011 there has been mild compression of the L5 vertebral body with approximately 10-15% loss of vertebral body height. No surrounding soft tissue swelling on today's examination to suggest an acute fracture. There are no aggressive appearing lytic or blastic lesions noted in the visualized portions of the skeleton.  IMPRESSION: 1. Mural thickening of the cecum and ascending colon suggestive of a colitis. 2. No other acute findings are identified in the abdomen or pelvis at this time. 3. Probable functional biliary ductal dilatation in a post cholecystectomy patient, as above. 4. Age indeterminate compression fracture of L5 with approximately 10-15% loss of vertebral body height. This does  not appear to be acute, but is new compared to remote prior study from 04/24/2011. 5. Small locule of gas non dependently within the lumen of the urinary bladder. If the patient has been catheterized for a recent urinalysis, this is presumably iatrogenic. In the absence of such history, clinical correlation for signs and symptoms of urinary tract infection would be recommended, as infection with gas-forming organisms could also account for this finding. 6. Moderate size hiatal hernia. 7. Additional incidental findings, as detailed above.   Electronically Signed   By: Trudie Reed M.D.   On: 03/30/2014 18:07    Microbiology: Recent Results (from the past 240 hour(s))  OVA AND PARASITE EXAMINATION     Status: None   Collection Time    03/31/14  2:42 AM  Result Value Ref Range Status   Specimen Description STOOL   Final   Special Requests NONE   Final   Ova and parasites     Final   Value: NO OVA OR PARASITES SEEN ABUNDANT YEAST     Performed at [facility]   Report Status 04/01/2014 FINAL   Final  CLOSTRIDIUM DIFFICILE BY PCR     Status: None   Collection Time    03/31/14  8:46 AM      Result Value Ref Range Status   C difficile by pcr NEGATIVE  NEGATIVE Final     Labs: Basic Metabolic Panel:  Recent Labs Lab 03/30/14 1120 03/30/14 1438 03/31/14 0630 04/01/14 0347 04/02/14 0704  NA 138  --  145 138 140  K 3.1*  --  3.4* 4.0 4.9  CL 101  --  107 103 108  CO2 22  --  23 19 21   GLUCOSE 117*  --  101* 115* 104*  BUN 9  --  7 8 11   CREATININE 0.51  --  0.53 0.57 0.51  CALCIUM 5.8*  --  6.2* 5.7* 6.7*  MG  --  1.5  --  1.7  --   PHOS  --  3.2  --   --   --    Liver Function Tests:  Recent Labs Lab 03/30/14 1120 04/01/14 0347 04/02/14 0704  AST 21 32 37  ALT 21 22 30   ALKPHOS 263* 202* 237*  BILITOT 0.5 0.3 0.3  PROT 6.7 5.7* 6.4  ALBUMIN 3.1* 2.6* 2.9*   CBC:  Recent Labs Lab 03/30/14 1120 03/31/14 0630 04/01/14 0347  WBC 10.0 7.1 6.1   NEUTROABS 8.5*  --   --   HGB 12.2 11.8* 10.1*  HCT 35.9* 35.9* 31.0*  MCV 95.2 97.8 97.2  PLT 194 185 175   Cardiac Enzymes:  Recent Labs Lab 03/30/14 1245  TROPONINI <0.30    Signed:  Karen Kitchens [phone]  [facility] 04/02/2014, 1:27 PM

## 2014-04-02 NOTE — Progress Notes (Signed)
Nsg Discharge Note  Admit Date:  03/30/2014 Discharge date: 04/02/2014   [name] to be D/C'd Home per MD order.  AVS completed.  Copy for chart, and copy for patient signed, and dated. Patient/caregiver able to verbalize understanding.  Discharge Medication:   Medication List    STOP taking these medications       NEXIUM 40 MG capsule  Generic drug:  esomeprazole      TAKE these medications       acetaminophen 500 MG tablet  Commonly known as:  TYLENOL  Take 1,500 mg by mouth every 6 (six) hours as needed for mild pain.     calcium carbonate 500 MG chewable tablet  Commonly known as:  TUMS - dosed in mg elemental calcium  Chew 2 tablets (400 mg of elemental calcium total) by mouth 4 (four) times daily -  before meals and at bedtime.     citalopram 10 MG tablet  Commonly known as:  CELEXA  Take 10 mg by mouth daily.     dextromethorphan-guaiFENesin 30-600 MG per 12 hr tablet  Commonly known as:  MUCINEX DM  Take 1 tablet by mouth 2 (two) times daily.     ERY-TAB 250 MG EC tablet  Generic drug:  erythromycin  Take 250 mg by mouth daily.     furosemide 20 MG tablet  Commonly known as:  LASIX  Take 20 mg by mouth as needed for fluid.     morphine 30 MG 12 hr tablet  Commonly known as:  MS CONTIN  Take 30 mg by mouth 2 (two) times daily. q 12 hours     nitroGLYCERIN 0.4 MG/SPRAY spray  Commonly known as:  NITROLINGUAL  Place 1 spray under the tongue every 5 (five) minutes as needed for chest pain.     ondansetron 4 MG tablet  Commonly known as:  ZOFRAN  Take 4 mg by mouth 2 (two) times daily.     saccharomyces boulardii 250 MG capsule  Commonly known as:  FLORASTOR  Take 1 capsule (250 mg total) by mouth 2 (two) times daily.     Vitamin B-12 1000 MCG Subl  Place under the tongue as needed.        Discharge Assessment: Filed Vitals:   04/02/14 0430  BP: 151/80  Pulse: 74  Temp: 98.5 F (36.9 C)  Resp: 18   Skin clean, dry and intact without  evidence of skin break down, no evidence of skin tears noted. IV catheter discontinued intact. Site without signs and symptoms of complications - no redness or edema noted at insertion site, patient denies c/o pain - only slight tenderness at site.  Dressing with slight pressure applied.  D/c Instructions-Education: Discharge instructions given to patient/family with verbalized understanding. D/c education completed with patient/family including follow up instructions, medication list, d/c activities limitations if indicated, with other d/c instructions as indicated by MD - patient able to verbalize understanding, all questions fully answered. Patient instructed to return to ED, call 911, or call MD for any changes in condition.  Patient escorted via [place], and D/C home via private auto.  Dayle Points, RN 04/02/2014 4:11 PM

## 2014-04-02 NOTE — Progress Notes (Signed)
Diarr resolved--in fact, pt states she had a NORMAL BM this morning!  F/u w/ Dr. Paulita Fujita as outpt planned for May 12 to consider arranging colonoscopy at that time (see yesterday's note).  Pt's CC at this time relates to chest congestion/tightness/coughing--she indicates the Hospitalist physician is aware of this, and will be releasing her today (ok w/ me from the GI tract standpoint) and will be giving her some cough medicine.  Will sign off, call if questions.   Cleotis Nipper, M.D. [phone]

## 2014-04-02 NOTE — Discharge Summary (Signed)
Addendum  Patient seen and examined, chart and data base reviewed.  I agree with the above assessment and plan.  For full details please see Mrs. Imogene Burn PA note.  Diarrhea, unclear etiology but improved on its own.   Patient to followup with [facility], Dr. Paulita Fujita for colonoscopy as outpatient (primarily for screening purposes)   Birdie Hopes, MD [facility] Pager: [phone] 04/02/2014, 7:00 PM

## 2014-04-07 LAB — VITAMIN D 1,25 DIHYDROXY
Vitamin D 1, 25 (OH)2 Total: 32 pg/mL (ref 18–72)
Vitamin D3 1, 25 (OH)2: 32 pg/mL

## 2014-04-13 ENCOUNTER — Other Ambulatory Visit: Payer: Self-pay | Admitting: Oncology

## 2014-04-14 ENCOUNTER — Other Ambulatory Visit: Payer: Federal, State, Local not specified - PPO

## 2014-04-14 ENCOUNTER — Ambulatory Visit: Payer: Federal, State, Local not specified - PPO | Admitting: Oncology

## 2014-04-28 ENCOUNTER — Encounter: Payer: Self-pay | Admitting: Oncology

## 2014-04-28 NOTE — Progress Notes (Signed)
Medical Oncology  Patient FTKA MD appointments at [facility] 03-29-14 and 04-14-14.  Paula Hoaglin,MD

## 2014-05-24 ENCOUNTER — Other Ambulatory Visit: Payer: Self-pay | Admitting: Gastroenterology

## 2014-09-03 ENCOUNTER — Other Ambulatory Visit: Payer: Self-pay | Admitting: Cardiovascular Disease

## 2014-09-06 NOTE — Telephone Encounter (Signed)
Rx was sent to pharmacy electronically. 

## 2014-09-20 ENCOUNTER — Telehealth: Payer: Self-pay | Admitting: Cardiovascular Disease

## 2014-09-20 MED ORDER — FUROSEMIDE 20 MG PO TABS: 20.0000 mg | ORAL_TABLET | ORAL | Status: DC | PRN

## 2014-09-20 NOTE — Telephone Encounter (Signed)
Lasix refilled electronically.

## 2014-09-20 NOTE — Telephone Encounter (Signed)
Has had 4 episodes of chest pain over the past week each relieved w/1 spray of NTG except for the most recent episode she experienced in [place].  She did not have NTG w/her but had the pharmacy refill quickly - this episode took 2 sprays to resolve.  Also has increased lower extremity edema right > left.  States she can hardly lift her right leg to prop up (has to reach down and pull her leg up.  She is not taking Lasix daily because " it makes her urinate too frequently".  Instructed to take Lasix daily until assessed in the office and to make sure she is not eating salty foods and told to decrease fluid intake.  Worked in w/Dr. Loletha Grayer Wednesday at 2:45pm.  Instructed to call EMS if she has another episode of chest pain.  Voiced understanding.

## 2014-09-20 NOTE — Telephone Encounter (Signed)
Pt called in wanting to be seen as soon as possible due to extreme swelling in her rt leg and really bad chest pain. Please call

## 2014-09-22 ENCOUNTER — Ambulatory Visit (INDEPENDENT_AMBULATORY_CARE_PROVIDER_SITE_OTHER): Payer: Federal, State, Local not specified - PPO | Admitting: Cardiovascular Disease

## 2014-09-22 ENCOUNTER — Encounter: Payer: Self-pay | Admitting: Cardiovascular Disease

## 2014-09-22 VITALS — BP 126/68 | HR 76 | Resp 20 | Ht 65.0 in | Wt 164.7 lb

## 2014-09-22 DIAGNOSIS — Z9884 Bariatric surgery status: Secondary | ICD-10-CM

## 2014-09-22 DIAGNOSIS — R0602 Shortness of breath: Secondary | ICD-10-CM

## 2014-09-22 DIAGNOSIS — R079 Chest pain, unspecified: Secondary | ICD-10-CM

## 2014-09-22 DIAGNOSIS — I2510 Atherosclerotic heart disease of native coronary artery without angina pectoris: Secondary | ICD-10-CM

## 2014-09-22 DIAGNOSIS — I6523 Occlusion and stenosis of bilateral carotid arteries: Secondary | ICD-10-CM

## 2014-09-22 DIAGNOSIS — D509 Iron deficiency anemia, unspecified: Secondary | ICD-10-CM

## 2014-09-22 DIAGNOSIS — I5033 Acute on chronic diastolic (congestive) heart failure: Secondary | ICD-10-CM

## 2014-09-22 DIAGNOSIS — Z79899 Other long term (current) drug therapy: Secondary | ICD-10-CM

## 2014-09-22 DIAGNOSIS — I471 Supraventricular tachycardia: Secondary | ICD-10-CM

## 2014-09-22 MED ORDER — POTASSIUM CHLORIDE ER 10 MEQ PO TBCR: 10.0000 meq | EXTENDED_RELEASE_TABLET | Freq: Every day | ORAL | Status: DC

## 2014-09-22 MED ORDER — FUROSEMIDE 40 MG PO TABS: 40.0000 mg | ORAL_TABLET | Freq: Every day | ORAL | Status: DC

## 2014-09-22 NOTE — Patient Instructions (Addendum)
Increase Furosemideto 40mg  daily.  Start Potassium 61meq daily.  Your physician recommends that you return for lab work in: at [facility].  Your physician recommends that you weigh, daily, at the same time every day, and in the same amount of clothing. Please record your daily weights on the handout provided and bring it to your next appointment.   Your physician has requested that you have an echocardiogram. Echocardiography is a painless test that uses sound waves to create images of your heart. It provides your doctor with information about the size and shape of your heart and how well your heart's chambers and valves are working. This procedure takes approximately one hour. There are no restrictions for this procedure.  Dr. Sallyanne Kuster recommends that you schedule a follow-up appointment in: 2 weeks.

## 2014-09-23 ENCOUNTER — Encounter: Payer: Self-pay | Admitting: Cardiovascular Disease

## 2014-09-23 DIAGNOSIS — I5033 Acute on chronic diastolic (congestive) heart failure: Secondary | ICD-10-CM | POA: Insufficient documentation

## 2014-09-23 NOTE — Progress Notes (Signed)
Patient ID: [name], female   DOB: 27-Apr-1951, 63 y.o.   MRN: [id number]      Reason for office visit Acute heart failure exacerbation  [name] asked to be seen today since she has developed bilateral lower extremity edema, exertional dyspnea and weight gain.  She has not had angina pectoris for years, but in parallel with the worsening fluid retention she has developed recurrent episodes of chest discomfort that aren't relieved after one or 2 sublingual nitroglycerin tablets. She has a known history of diastolic heart failure that was well compensated (NYHA functional class I-II). Until recently, she was taking furosemide only on the average once a week for a random occasional ankle edema. She has started taking the furosemide daily and has noticed a little bit of relief over the last 3 or 4 days.  She underwent coronary artery bypass surgery approximately 21 years ago. She has known chronic total bilateral carotid occlusion in her entire cerebral vascular blood supply is via large vertebral arteries. She has a remote history of diabetes mellitus and hypertension and hyperlipidemia. After gastric bypass surgery and substantial weight loss she has been very well compensated and has not required medications for these disorders in a few years. 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 His autopsy showed three-vessel advanced coronary disease as well as evidence of cardiomyopathy. Since that time her diet has been relatively sodium rich. She no longer feels the drive to cook her meals and is buying prepared meals.  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  Allergies  Allergen Reactions  . Latex   .  Metoclopramide Hcl   . Neurontin [Gabapentin]   . Penicillins     Current Outpatient Prescriptions  Medication Sig Dispense Refill  . acetaminophen (TYLENOL) 500 MG tablet Take 1,500 mg by mouth every 6 (six) hours as needed for mild pain.       . Calcium Carb-Cholecalciferol (CALCIUM 1000 + D PO) Take 1 tablet by mouth daily.      . citalopram (CELEXA) 10 MG tablet Take 10 mg by mouth daily.      . Cyanocobalamin (VITAMIN B-12) 1000 MCG SUBL Place under the tongue as needed.      . ERY-TAB 250 MG EC tablet Take 250 mg by mouth daily as needed.       . furosemide (LASIX) 40 MG tablet Take 1 tablet (40 mg total) by mouth daily.  30 tablet  5  . morphine (MS CONTIN) 30 MG 12 hr tablet Take 30 mg by mouth 2 (two) times daily. q 12 hours      . NEXIUM 40 MG capsule Take 1 capsule by mouth daily.      . nitroGLYCERIN (NITROLINGUAL) 0.4 MG/SPRAY spray USE ONE TO TWO SPRAYS UNDER THE TONGUE EVERY 5 MINUTES AS NEEDED FOR CHEST PAIN  12 g  5  . ondansetron (ZOFRAN) 4 MG tablet Take 4 mg by mouth 2 (two) times daily as needed.       . potassium chloride (K-DUR) 10 MEQ tablet Take 1 tablet (10 mEq total) by mouth daily.  30 tablet  5   No current facility-administered medications for this visit.    Past Medical History  Diagnosis Date  . Ischemic heart  disease   . Diastolic dysfunction   . Chronic anemia   . History of recurrent UTIs   . Peroneal neuropathy   . Retinopathy   . Stroke   . Hypocalcemia   . Morbid obesity     s/p gastric bypass  . Kidney stones   . Depression   . CAD s/p CABG 1994 09/25/2013    LIMA-LAD, SVG Diag, SVG-RCA, [facility]ricks[facility] Cath 2010: Stents in the proximal LAD with 40% in-stent restenosis and 50% post-stent stenosis, 80% small OM 1, 70% small OM 2, 40% first PLA; diffuse 70% distal RCA; patent SVG to diagonal, patent SVG to distal RCA, patent atretic LIMA EF 55%; normal nuclear stress test 2011  . Bilateral carotid artery occlusion 09/25/2013  . 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 Ventral hernia repair    . Appendectomy    . Vaginal hysterectomy    . Coronary artery bypass graft  1994    LIM to LAD,SVG to first diagonal,SVG to distal RCA  . Cardiac catheterization  03/30/2009    3 vessel CAD,patent grafts  . Gastric bypass  2002  . Cholecystectomy    . Heel spur excision  2012  . Trigger finger release  2012  . Radiofrequency ablation      AVNRT - successful  . Nm myocar perf wall motion  2011    no ischemia  . US echocardiography  05/09/2010    mild MR,mild mitral and aortic sclerosis    Family History  Problem Relation Age of Onset  . Diabetes Brother   . Heart attack Sister   . Hypertension    . Asthma Brother   . Cancer Father   . Diabetes Mother   . Diabetes Father   . Heart failure Mother     History   Social History  . Marital Status: Divorced    Spouse Name: N/A    Number of Children: 2  . Years of Education: N/A   Occupational History  . disabled    Social History Main Topics  . Smoking status: Former Smoker    Quit date: 12/18/1987  . Smokeless tobacco: Not on file  . Alcohol Use: No  . Drug Use: No  . Sexual Activity: Not on file   Other Topics Concern  . Not on file   Social History Narrative  . No narrative on file    Review of systems: New York Heart Association functional class III exertional dyspnea, no orthopnea or paroxysmal nocturnal dyspnea. Severe bilateral edema, right greater than left(left side saphenectomy harvest sites). Exertional angina relieved promptly with sublingual nitroglycerin. She denies syncope, palpitations or focal neurological problems. No complaints of intermittent claudication.  The patient also denies abdominal pain, nausea, vomiting, dysphagia, diarrhea, constipation, polyuria, polydipsia, dysuria, hematuria, frequency, urgency, abnormal bleeding or bruising, fever, chills, unexpected weight changes,  mood swings, change in skin or hair texture, change in voice quality, auditory or visual problems, allergic reactions or rashes, new musculoskeletal complaints other than usual "aches and pains".   PHYSICAL EXAM BP 126/68  Pulse 76  Resp 20  Ht 5\' 5"  (1.651 m)  Wt 74.707 kg (164 lb 11.2 oz)  BMI 27.41 kg/m2  General: Alert, oriented x3, no distress Head: no evidence of trauma, PERRL, EOMI, no exophtalmos or lid lag, no myxedema, no xanthelasma; normal ears, nose and oropharynx Neck: 6-7 centimeter elevation in jugular venous pulsations and prompt hepatojugular reflux; brisk carotid  pulses without delay and no carotid bruits Chest: clear to auscultation, no signs of consolidation by percussion or palpation, normal fremitus, symmetrical and full respiratory excursions, sternotomy scar Cardiovascular: normal position and quality of the apical impulse, regular rhythm, normal first and second heart sounds, no murmurs, rubs or gallops Abdomen: no tenderness or distention, no masses by palpation, no abnormal pulsatility or arterial bruits, normal bowel sounds, no hepatosplenomegaly Extremities: no clubbing, cyanosis ; 3+ bilateral heart pitting edema up to the knees, slightly worse on the right than on the left, right calf saphenectomy scars; 2+ radial, ulnar and brachial pulses bilaterally; 2+ right femoral, posterior tibial and dorsalis pedis pulses; 2+ left femoral, posterior tibial and dorsalis pedis pulses; no subclavian or femoral bruits Neurological: grossly nonfocal   EKG: Normal sinus rhythm, no repolarization abnormalities, questionable left atrial abnormality  Lipid Panel     Component Value Date/Time   CHOL 51 04/24/2011 2341   TRIG 47 04/24/2011 2341   HDL 20* 04/24/2011 2341   CHOLHDL 2.6 04/24/2011 2341   VLDL 9 04/24/2011 2341   LDLCALC  Value: 22        Total Cholesterol/HDL:CHD Risk Coronary Heart Disease Risk Table                     Men   Women  1/2 Average Risk   3.4   3.3   Average Risk       5.0   4.4  2 X Average Risk   9.6   7.1  3 X Average Risk  23.4   11.0        Use the calculated Patient Ratio above and the CHD Risk Table to determine the patient's CHD Risk.        ATP III CLASSIFICATION (LDL):  <100     mg/dL   Optimal  100-129  mg/dL   Near or Above                    Optimal  130-159  mg/dL   Borderline  160-189  mg/dL   High  >190     mg/dL   Very High 04/24/2011 2341    BMET    Component Value Date/Time   NA 140 04/02/2014 0704   NA 137 09/30/2013 1404   K 4.9 04/02/2014 0704   K 4.1 09/30/2013 1404   CL 108 04/02/2014 0704   CL 107 03/23/2013 1401   CO2 21 04/02/2014 0704   CO2 23 09/30/2013 1404   GLUCOSE 104* 04/02/2014 0704   GLUCOSE 130 09/30/2013 1404   GLUCOSE 163* 03/23/2013 1401   BUN 11 04/02/2014 0704   BUN 9.1 09/30/2013 1404   CREATININE 0.51 04/02/2014 0704   CREATININE 0.7 09/30/2013 1404   CALCIUM 6.7* 04/02/2014 0704   CALCIUM 6.5* 09/30/2013 1404   GFRNONAA >90 04/02/2014 0704   GFRAA >90 04/02/2014 0704     ASSESSMENT AND PLAN CAD s/p CABG 1994  21 years after bypass surgery she has again began to develop angina pectoris. This is associated with clear evidence of volume overload and heart failure exacerbation. It is possible that her angina is due to subendocardial ischemia in the setting of high filling pressure or loss of bypass conduits. Acute on chronic Diastolic heart failure  This may have occurred secondary to poor compliance with sodium restriction. In the past she had a very disciplined diet. Will check an echocardiogram and increase the diuretic dose. Repeat labs next week. If  there is a drop in left ventricular ejection fraction she should proceed directly to cardiac catheterization for suspected new coronary stenoses. If on the other hand ejection fraction is normal and she responds well to diuretics I wouldn't recommend a nuclear perfusion study Iron deficiency anemia  Previous endoscopic evaluations have not shown a source  of bleeding. She used to require periodic intravenous iron. She may have malabsorption related to her previous gastric bypass surgery. Clinically she does not appear particularly pale, but we'll check a CBC to make sure that anemia is not a cause or contributing factor to her heart failure exacerbation.  Orders Placed This Encounter  Procedures  . CBC  . Comprehensive metabolic panel  . Brain natriuretic peptide  . EKG 12-Lead  . 2D Echocardiogram without contrast   Meds ordered this encounter  Medications  . Calcium Carb-Cholecalciferol (CALCIUM 1000 + D PO)    Sig: Take 1 tablet by mouth daily.  Marland Kitchen NEXIUM 40 MG capsule    Sig: Take 1 capsule by mouth daily.  . furosemide (LASIX) 40 MG tablet    Sig: Take 1 tablet (40 mg total) by mouth daily.    Dispense:  30 tablet    Refill:  5  . potassium chloride (K-DUR) 10 MEQ tablet    Sig: Take 1 tablet (10 mEq total) by mouth daily.    Dispense:  30 tablet    Refill:  [place] Demitrus Francisco, MD, [facility] [phone] office [phone] pager

## 2014-09-27 ENCOUNTER — Ambulatory Visit (HOSPITAL_COMMUNITY)
Admission: RE | Admit: 2014-09-27 | Discharge: 2014-09-27 | Disposition: A | Discharge status: Home / Self Care | Payer: Federal, State, Local not specified - PPO | Source: Ambulatory Visit | Attending: Cardiovascular Disease | Admitting: Cardiovascular Disease

## 2014-09-27 DIAGNOSIS — I369 Nonrheumatic tricuspid valve disorder, unspecified: Secondary | ICD-10-CM

## 2014-09-27 DIAGNOSIS — R609 Edema, unspecified: Secondary | ICD-10-CM | POA: Insufficient documentation

## 2014-09-27 DIAGNOSIS — I359 Nonrheumatic aortic valve disorder, unspecified: Secondary | ICD-10-CM | POA: Diagnosis not present

## 2014-09-27 DIAGNOSIS — R0602 Shortness of breath: Secondary | ICD-10-CM

## 2014-09-27 DIAGNOSIS — I2510 Atherosclerotic heart disease of native coronary artery without angina pectoris: Secondary | ICD-10-CM

## 2014-09-27 DIAGNOSIS — Z8249 Family history of ischemic heart disease and other diseases of the circulatory system: Secondary | ICD-10-CM | POA: Insufficient documentation

## 2014-09-27 DIAGNOSIS — I255 Ischemic cardiomyopathy: Secondary | ICD-10-CM | POA: Diagnosis not present

## 2014-09-27 NOTE — Progress Notes (Signed)
2D Echocardiogram Complete.  09/27/2014   [name], [name]

## 2014-09-28 ENCOUNTER — Telehealth: Payer: Self-pay | Admitting: Cardiovascular Disease

## 2014-09-28 LAB — COMPREHENSIVE METABOLIC PANEL
ALBUMIN: 3.7 g/dL (ref 3.5–5.2)
ALT: 29 U/L (ref 0–35)
AST: 43 U/L — ABNORMAL HIGH (ref 0–37)
Alkaline Phosphatase: 277 U/L — ABNORMAL HIGH (ref 39–117)
BILIRUBIN TOTAL: 0.5 mg/dL (ref 0.2–1.2)
BUN: 9 mg/dL (ref 6–23)
CO2: 22 meq/L (ref 19–32)
Calcium: 5.8 mg/dL — CL (ref 8.4–10.5)
Chloride: 109 mEq/L (ref 96–112)
Creat: 0.49 mg/dL — ABNORMAL LOW (ref 0.50–1.10)
Glucose, Bld: 86 mg/dL (ref 70–99)
POTASSIUM: 4.2 meq/L (ref 3.5–5.3)
SODIUM: 140 meq/L (ref 135–145)
TOTAL PROTEIN: 6.1 g/dL (ref 6.0–8.3)

## 2014-09-28 LAB — CBC
HCT: 35.1 % — ABNORMAL LOW (ref 36.0–46.0)
Hemoglobin: 11.6 g/dL — ABNORMAL LOW (ref 12.0–15.0)
MCH: 30.9 pg (ref 26.0–34.0)
MCHC: 33 g/dL (ref 30.0–36.0)
MCV: 93.4 fL (ref 78.0–100.0)
PLATELETS: 153 10*3/uL (ref 150–400)
RBC: 3.76 MIL/uL — AB (ref 3.87–5.11)
RDW: 13.8 % (ref 11.5–15.5)
WBC: 5.1 10*3/uL (ref 4.0–10.5)

## 2014-09-28 LAB — BRAIN NATRIURETIC PEPTIDE: BRAIN NATRIURETIC PEPTIDE: 71.5 pg/mL (ref 0.0–100.0)

## 2014-09-28 NOTE — Telephone Encounter (Signed)
Labs have been reviewed by dr croitoru

## 2014-09-28 NOTE — Telephone Encounter (Signed)
Spoke with [facility], they called to report a critical Ca of 5.8 which is low.  They are going to fax the results to dr croitoru.

## 2014-09-28 NOTE — Progress Notes (Signed)
Please double furosemide 80 mg daily

## 2014-09-29 ENCOUNTER — Telehealth: Payer: Self-pay | Admitting: *Deleted

## 2014-09-29 MED ORDER — FUROSEMIDE 40 MG PO TABS: 80.0000 mg | ORAL_TABLET | Freq: Every day | ORAL | Status: DC

## 2014-09-29 NOTE — Telephone Encounter (Signed)
Per Dr. Loletha Grayer increase Furosemide to 80mg  daily.  Message left for patient with new instructions.  Med list updated.

## 2014-09-29 NOTE — Telephone Encounter (Signed)
Message copied by Tressa Busman on Wed Sep 29, 2014  1:18 PM ------      Message from: Sanda Klein      Created: Mon Sep 27, 2014  6:56 PM       B. echo confirms the impression that she is fluid overloaded/heart failure exacerbation, but thankfully her heart pumping function remains very normal. ------

## 2014-10-06 ENCOUNTER — Encounter: Payer: Self-pay | Admitting: Cardiovascular Disease

## 2014-10-06 ENCOUNTER — Ambulatory Visit (INDEPENDENT_AMBULATORY_CARE_PROVIDER_SITE_OTHER): Payer: Federal, State, Local not specified - PPO | Admitting: Cardiovascular Disease

## 2014-10-06 VITALS — BP 104/62 | HR 80 | Resp 16 | Ht 65.0 in | Wt 161.6 lb

## 2014-10-06 DIAGNOSIS — D509 Iron deficiency anemia, unspecified: Secondary | ICD-10-CM

## 2014-10-06 DIAGNOSIS — I5033 Acute on chronic diastolic (congestive) heart failure: Secondary | ICD-10-CM

## 2014-10-06 DIAGNOSIS — Z9884 Bariatric surgery status: Secondary | ICD-10-CM

## 2014-10-06 DIAGNOSIS — I6523 Occlusion and stenosis of bilateral carotid arteries: Secondary | ICD-10-CM

## 2014-10-06 DIAGNOSIS — I25810 Atherosclerosis of coronary artery bypass graft(s) without angina pectoris: Secondary | ICD-10-CM

## 2014-10-06 MED ORDER — FUROSEMIDE 80 MG PO TABS: 80.0000 mg | ORAL_TABLET | Freq: Every day | ORAL | Status: DC

## 2014-10-06 MED ORDER — METOLAZONE 2.5 MG PO TABS: ORAL_TABLET | ORAL | Status: DC

## 2014-10-06 NOTE — Patient Instructions (Signed)
Take Metolazone 2.5mg  x 3 days then take as needed if weight is over 155 lbs.  A new Rx has been sent to your pharmacy for Furosemide for an 80mg  tablet.  Your physician recommends that you schedule a follow-up appointment in: PA/NP in 4-6 weeks.  Dr. Sallyanne Kuster recommends that you schedule a follow-up appointment in: 3 months.

## 2014-10-08 ENCOUNTER — Encounter: Payer: Self-pay | Admitting: Cardiovascular Disease

## 2014-10-08 ENCOUNTER — Telehealth: Payer: Self-pay | Admitting: *Deleted

## 2014-10-08 DIAGNOSIS — R079 Chest pain, unspecified: Secondary | ICD-10-CM

## 2014-10-08 DIAGNOSIS — R0600 Dyspnea, unspecified: Secondary | ICD-10-CM

## 2014-10-08 NOTE — Progress Notes (Signed)
Patient ID: [name], female   DOB: [birth year], 63 y.o.   MRN: [id number]      Reason for office visit Acute CHF exacerbation  Since escalating diuretics, her dyspnea and edema have improved, but only partially. She has only lost 3 lb in weight and diuresis has plateau 'd. She denies angina, dizziness or syncope.  Her echocardiogram shows that she still has normal left ventricular systolic function and had clear evidence of increased filling pressures (pseudo-normal mitral inflow).  She underwent coronary artery bypass surgery approximately 21 years ago. She has known chronic total bilateral carotid occlusion - her entire cerebral vascular blood supply is via large vertebral arteries. She has a remote history of diabetes mellitus and hypertension and hyperlipidemia. After gastric bypass surgery and substantial weight loss she has been very well compensated and has not required medications for these disorders in a few years. 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 Until recently, she was taking furosemide only on the average once a week for a random occasional ankle edema.  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   Allergies  Allergen Reactions  . Latex   . Metoclopramide Hcl   . Neurontin [Gabapentin]   . Penicillins     Current Outpatient Prescriptions  Medication Sig Dispense Refill  . acetaminophen (TYLENOL) 500 MG tablet Take 1,500 mg by mouth every 6 (six) hours as needed for mild pain.       . Calcium Carb-Cholecalciferol (CALCIUM 1000 + D PO) Take 1 tablet by  mouth daily.      . citalopram (CELEXA) 10 MG tablet Take 10 mg by mouth daily.      . Cyanocobalamin (VITAMIN B-12) 1000 MCG SUBL Place under the tongue as needed.      . ERY-TAB 250 MG EC tablet Take 250 mg by mouth daily as needed.       . furosemide (LASIX) 80 MG tablet Take 1 tablet (80 mg total) by mouth daily.  30 tablet  5  . KLOR-CON M10 10 MEQ tablet       . morphine (MS CONTIN) 30 MG 12 hr tablet Take 30 mg by mouth 2 (two) times daily. q 12 hours      . NEXIUM 40 MG capsule Take 1 capsule by mouth daily.      . nitroGLYCERIN (NITROLINGUAL) 0.4 MG/SPRAY spray USE ONE TO TWO SPRAYS UNDER THE TONGUE EVERY 5 MINUTES AS NEEDED FOR CHEST PAIN  12 g  5  . ondansetron (ZOFRAN) 4 MG tablet Take 4 mg by mouth 2 (two) times daily as needed.       . potassium chloride (K-DUR) 10 MEQ tablet Take 1 tablet (10 mEq total) by mouth daily.  30 tablet  5  . metolazone (ZAROXOLYN) 2.5 MG tablet Take one tablet daily  X 3 days then as needed if weight is greater than 155 lbs.  90 tablet  3   No current facility-administered medications for this visit.    Past Medical History  Diagnosis Date  . Ischemic heart disease   . Diastolic dysfunction   . Chronic anemia   . History of recurrent UTIs   .  Peroneal neuropathy   . Retinopathy   . Stroke   . Hypocalcemia   . Morbid obesity     s/p gastric bypass  . Kidney stones   . Depression   . CAD s/p CABG 1994 09/25/2013    LIMA-LAD, SVG Diag, SVG-RCA, [facility] Cath 2010: Stents in the proximal LAD with 40% in-stent restenosis and 50% post-stent stenosis, 80% small OM 1, 70% small OM 2, 40% first PLA; diffuse 70% distal RCA; patent SVG to diagonal, patent SVG to distal RCA, patent atretic LIMA EF 55%; normal nuclear stress test 2011  . Bilateral carotid artery occlusion 09/25/2013  . 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 Ventral hernia repair    . Appendectomy    . Vaginal  hysterectomy    . Coronary artery bypass graft  1994    LIM to LAD,SVG to first diagonal,SVG to distal RCA  . Cardiac catheterization  03/30/2009    3 vessel CAD,patent grafts  . Gastric bypass  2002  . Cholecystectomy    . Heel spur excision  2012  . Trigger finger release  2012  . Radiofrequency ablation      AVNRT - successful  . Nm myocar perf wall motion  2011    no ischemia  . US echocardiography  05/09/2010    mild MR,mild mitral and aortic sclerosis    Family History  Problem Relation Age of Onset  . Diabetes Brother   . Heart attack Sister   . Hypertension    . Asthma Brother   . Cancer Father   . Diabetes Mother   . Diabetes Father   . Heart failure Mother     History   Social History  . Marital Status: Divorced    Spouse Name: N/A    Number of Children: 2  . Years of Education: N/A   Occupational History  . disabled    Social History Main Topics  . Smoking status: Former Smoker    Quit date: 12/18/1987  . Smokeless tobacco: Not on file  . Alcohol Use: No  . Drug Use: No  . Sexual Activity: Not on file   Other Topics Concern  . Not on file   Social History Narrative  . No narrative on file    Review of systems: New York Heart Association functional class III exertional dyspnea, no orthopnea or paroxysmal nocturnal dyspnea. Moderate bilateral edema, right greater than left(left side saphenectomy harvest sites). Exertional angina relieved promptly with sublingual nitroglycerin. She denies syncope, palpitations or focal neurological problems. No complaints of intermittent claudication.  The patient also denies abdominal pain, nausea, vomiting, dysphagia, diarrhea, constipation, polyuria, polydipsia, dysuria, hematuria, frequency, urgency, abnormal bleeding or bruising, fever, chills, unexpected weight changes, mood swings, change in skin or hair texture, change in voice quality, auditory or visual problems, allergic reactions or rashes, new  musculoskeletal complaints other than usual "aches and pains".   PHYSICAL EXAM BP 104/62  Pulse 80  Resp 16  Ht 5\' 5"  (1.651 m)  Wt 161 lb 9.6 oz (73.301 kg)  BMI 26.89 kg/m2 General: Alert, oriented x3, no distress  Head: no evidence of trauma, PERRL, EOMI, no exophtalmos or lid lag, no myxedema, no xanthelasma; normal ears, nose and oropharynx  Neck: 6-7 centimeter elevation in jugular venous pulsations and prompt hepatojugular reflux; brisk carotid pulses without delay and no carotid bruits  Chest: clear to auscultation, no signs of consolidation by percussion or palpation,  normal fremitus, symmetrical and full respiratory excursions, sternotomy scar  Cardiovascular: normal position and quality of the apical impulse, regular rhythm, normal first and second heart sounds, no murmurs, rubs or gallops  Abdomen: no tenderness or distention, no masses by palpation, no abnormal pulsatility or arterial bruits, normal bowel sounds, no hepatosplenomegaly  Extremities: no clubbing, cyanosis ; 3+ bilateral heart pitting edema up to the knees, slightly worse on the right than on the left, right calf saphenectomy scars; 2+ radial, ulnar and brachial pulses bilaterally; 2+ right femoral, posterior tibial and dorsalis pedis pulses; 2+ left femoral, posterior tibial and dorsalis pedis pulses; no subclavian or femoral bruits  Neurological: grossly nonfocal   Lipid Panel     Component Value Date/Time   CHOL 51 04/24/2011 2341   TRIG 47 04/24/2011 2341   HDL 20* 04/24/2011 2341   CHOLHDL 2.6 04/24/2011 2341   VLDL 9 04/24/2011 2341   LDLCALC  Value: 22        Total Cholesterol/HDL:CHD Risk Coronary Heart Disease Risk Table                     Men   Women  1/2 Average Risk   3.4   3.3  Average Risk       5.0   4.4  2 X Average Risk   9.6   7.1  3 X Average Risk  23.4   11.0        Use the calculated Patient Ratio above and the CHD Risk Table to determine the patient's CHD Risk.        ATP III CLASSIFICATION (LDL):   <100     mg/dL   Optimal  100-129  mg/dL   Near or Above                    Optimal  130-159  mg/dL   Borderline  160-189  mg/dL   High  >190     mg/dL   Very High 04/24/2011 2341    BMET    Component Value Date/Time   NA 140 09/27/2014 1134   NA 137 09/30/2013 1404   K 4.2 09/27/2014 1134   K 4.1 09/30/2013 1404   CL 109 09/27/2014 1134   CL 107 03/23/2013 1401   CO2 22 09/27/2014 1134   CO2 23 09/30/2013 1404   GLUCOSE 86 09/27/2014 1134   GLUCOSE 130 09/30/2013 1404   GLUCOSE 163* 03/23/2013 1401   BUN 9 09/27/2014 1134   BUN 9.1 09/30/2013 1404   CREATININE 0.49* 09/27/2014 1134   CREATININE 0.51 04/02/2014 0704   CREATININE 0.7 09/30/2013 1404   CALCIUM 5.8* 09/27/2014 1134   CALCIUM 6.5* 09/30/2013 1404   GFRNONAA >90 04/02/2014 0704   GFRAA >90 04/02/2014 0704     ASSESSMENT AND PLAN CAD s/p CABG 1994 21 years after bypass surgery she has again began to develop angina pectoris. This is associated with clear evidence of volume overload and heart failure exacerbation. It seems to have improved with diuresis making it more likely that her angina is due to subendocardial ischemia in the setting of high filling pressure rather than the loss of bypass conduits. However she is responding rather poorly to treatment, will check a nuclear perfusion study.  Acute on chronic Diastolic heart failure  She is improving rather slowly on a relatively high dose of loop diuretic. Will add metolazone and reassess frequently. Target dry weight of 155 lb, based on her weight 6 months  ago. Consider constrictive pericarditis - inferior vena cava reported as normal in size on last echo, making this less likely.  Iron deficiency anemia  Hemoglobin 11.6 - unlikely that anemia is a contributing factor to her heart failure exacerbation.  Patient Instructions  Take Metolazone 2.5mg  x 3 days then take as needed if weight is over 155 lbs.  A new Rx has been sent to your pharmacy for Furosemide for an 80mg   tablet.  Your physician recommends that you schedule a follow-up appointment in: PA/NP in 4-6 weeks.  Dr. Sallyanne Kuster recommends that you schedule a follow-up appointment in: 3 months.        Meds ordered this encounter  Medications  . KLOR-CON M10 10 MEQ tablet    Sig:   . furosemide (LASIX) 80 MG tablet    Sig: Take 1 tablet (80 mg total) by mouth daily.    Dispense:  30 tablet    Refill:  5  . metolazone (ZAROXOLYN) 2.5 MG tablet    Sig: Take one tablet daily  X 3 days then as needed if weight is greater than 155 lbs.    Dispense:  90 tablet    Refill:  [place] Oyinkansola Truax, MD, [facility] [phone] office [phone] pager

## 2014-10-08 NOTE — Telephone Encounter (Signed)
Message copied by Tressa Busman on Fri Oct 08, 2014  1:07 PM ------      Message from: Sanda Klein      Created: Fri Oct 08, 2014  9:24 AM       Looking through her records - it's been 6 years since last stress test. I think she should have a Lexiscan Myoview for her new CHF/dyspnea and chest pain. ------

## 2014-10-08 NOTE — Telephone Encounter (Signed)
Patient notified Dr. Loletha Grayer recommends she have a [employer].  Patient voiced understanding.  Order placed and sent to scheduling.

## 2014-10-12 ENCOUNTER — Encounter: Payer: Self-pay | Admitting: Family Medicine

## 2014-10-18 ENCOUNTER — Ambulatory Visit (INDEPENDENT_AMBULATORY_CARE_PROVIDER_SITE_OTHER): Payer: Federal, State, Local not specified - PPO | Admitting: Physician Assistant

## 2014-10-18 ENCOUNTER — Encounter: Payer: Self-pay | Admitting: Physician Assistant

## 2014-10-18 VITALS — BP 120/70 | HR 78 | Temp 98.8°F | Resp 18 | Ht 63.0 in | Wt 165.0 lb

## 2014-10-18 DIAGNOSIS — M25551 Pain in right hip: Secondary | ICD-10-CM

## 2014-10-18 DIAGNOSIS — F411 Generalized anxiety disorder: Secondary | ICD-10-CM

## 2014-10-18 DIAGNOSIS — I5032 Chronic diastolic (congestive) heart failure: Secondary | ICD-10-CM

## 2014-10-18 NOTE — Progress Notes (Signed)
Patient ID: [name] MRN: [id number], DOB: 06-Aug-1951, 63 y.o. Date of Encounter: @DATE @  Chief Complaint:  Chief Complaint  Patient presents with  . new pt get est    rt leg pain x 3 weeks, edema in both legs/ankles and feet turn purple    HPI: 63 y.o. year old white female  presents with a female friend with her today. They say they have been friends since child hood. Pt says that her son, [name], was a pt of mine.  Unfortunately, he passed away over this past year. He had a history of diabetes, renal transplant. I had gotten the death certificate so I knew about his death. Patient says that she did have an autopsy performed and that he did die secondary to his heart. Patient states that she is reestablishing to establish primary care with me because she felt that take good care of her son. She says that her current PCP has been Dr. Antony Blackbird at [facility].   She also sees "Doctor C" at [facility].   She says that the only medication that Dr. Chapman Fitch was prescribing is her Celexa. Says that she has been on this about 5 years and that it was started because of depression. Says that the Celexa is working well. Controls her depression and it causes no adverse effects.  Patient has 2 concerns that she wanted to evaluate today.  1- she has been having pain in the anterior aspect of her right groin/right hip joint for approximately 2 months now. Says that it is a throbbing pain.  2- also is having some swelling in her ankles. Says that " Dr. Loletha Grayer"  " gave her high powered fluid pill"  recently. She took it daily for 3 days but then has not taken anymore.   Past Medical History  Diagnosis Date  . Ischemic heart disease   . Diastolic dysfunction   . Chronic anemia   . History of recurrent UTIs   . Peroneal neuropathy   . Retinopathy   . Stroke   . Hypocalcemia   . Morbid obesity     s/p gastric bypass  . Depression   . CAD s/p CABG 1994 09/25/2013   LIMA-LAD, SVG Diag, SVG-RCA, [facility]rickson Cath 2010: Stents in the proximal LAD with 40% in-stent restenosis and 50% post-stent stenosis, 80% small OM 1, 70% small OM 2, 40% first PLA; diffuse 70% distal RCA; patent SVG to diagonal, patent SVG to distal RCA, patent atretic LIMA EF 55%; normal nuclear stress test 2011  . Bilateral carotid artery occlusion 09/25/2013  . 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 Allergy   . Anxiety   . Asthma   . Cataract   . CHF (congestive heart failure)   . Diabetes mellitus without complication   . GERD (gastroesophageal reflux disease)   . Glaucoma   . Heart murmur   . Hypertension   . Hyperlipidemia   . Osteoporosis      Home Meds: Outpatient Prescriptions Prior to Visit  Medication Sig Dispense Refill  . acetaminophen (TYLENOL) 500 MG tablet Take 1,500 mg by mouth every 6 (six) hours as needed for mild pain.     . Calcium Carb-Cholecalciferol (CALCIUM 1000 + D PO) Take 1 tablet by mouth daily.    . citalopram (CELEXA) 10 MG tablet Take 10 mg by mouth daily.    . Cyanocobalamin (VITAMIN B-12) 1000 MCG SUBL Place under the tongue as needed.    Marland Kitchen  ERY-TAB 250 MG EC tablet Take 250 mg by mouth daily as needed.     . furosemide (LASIX) 80 MG tablet Take 1 tablet (80 mg total) by mouth daily. 30 tablet 5  . KLOR-CON M10 10 MEQ tablet     . metolazone (ZAROXOLYN) 2.5 MG tablet Take one tablet daily  X 3 days then as needed if weight is greater than 155 lbs. 90 tablet 3  . morphine (MS CONTIN) 30 MG 12 hr tablet Take 30 mg by mouth 2 (two) times daily. q 12 hours    . NEXIUM 40 MG capsule Take 1 capsule by mouth daily.    . nitroGLYCERIN (NITROLINGUAL) 0.4 MG/SPRAY spray USE ONE TO TWO SPRAYS UNDER THE TONGUE EVERY 5 MINUTES AS NEEDED FOR CHEST PAIN 12 g 5  . ondansetron (ZOFRAN) 4 MG tablet Take 4 mg by mouth 2 (two) times daily as needed.     . potassium chloride (K-DUR) 10 MEQ tablet Take 1 tablet (10 mEq total) by mouth daily. 30 tablet 5   No  facility-administered medications prior to visit.    Allergies:  Allergies  Allergen Reactions  . Latex   . Metoclopramide Hcl   . Neurontin [Gabapentin]   . Penicillins     History   Social History  . Marital Status: Divorced    Spouse Name: N/A    Number of Children: 2  . Years of Education: N/A   Occupational History  . disabled    Social History Main Topics  . Smoking status: Former Smoker    Quit date: 12/18/1987  . Smokeless tobacco: Never Used  . Alcohol Use: No  . Drug Use: No  . Sexual Activity: Not Currently   Other Topics Concern  . 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  Family History  Problem Relation Age of Onset  . Diabetes Brother   . Asthma Brother   . Depression Brother   . Asthma Brother   . Depression Brother   . Diabetes Brother   . Cancer Father     skin  . Alcohol abuse Father   . Arthritis Father   . Depression Father   . Hypertension Father   . Diabetes Mother   . Heart failure Mother   . Arthritis Mother   . Heart disease Mother   . Asthma Sister   . Heart attack Sister   . Depression Sister   . Diabetes Sister   . Drug abuse Sister   . Heart disease Sister   . Mental illness Sister   . Heart attack Son   . Hypertension Son   . Kidney disease Son     transplant 2003  . Vision loss Son      Review of Systems:  See HPI for pertinent ROS. All other ROS negative.    Physical Exam: Blood pressure 120/70, pulse 78, temperature 98.8 F (37.1 C), temperature source Oral, resp. rate 18, height 5\' 3"  (1.6 m), weight 165 lb (74.844 kg)., Body mass index is 29.24 kg/(m^2). General: WNWD WF. Appears in no acute distress. Neck: Supple. No thyromegaly. No lymphadenopathy. No carotid bruits.  Lungs: Clear bilaterally to auscultation without wheezes, rales, or rhonchi. Breathing is unlabored. Heart: RRR with S1 S2. No murmurs, rubs, or  gallops. Abdomen: Soft, non-tender, non-distended with normoactive bowel sounds. No hepatomegaly. No rebound/guarding. No obvious abdominal masses. Musculoskeletal:  Strength and  tone normal for age. Extremities/Skin: Warm and dry. 2 + pitting edema --only to about 2 inches above ankle level now.  Neuro: Alert and oriented X 3. Moves all extremities spontaneously. Gait is normal. CNII-XII grossly in tact. Psych:  Responds to questions appropriately with a normal affect.     ASSESSMENT AND PLAN:  63 y.o. year old female with  1. Generalized anxiety disorder Stable/controlled with Celexa at current dose.  2. Chronic diastolic congestive heart failure I reviewed the office note from   "Dr. Loletha Grayer" dated 10/06/2014.  He had her on furosemide 80 mg daily. He also prescribed " metolazone  2.5mg  x 3 days then take as needed if weight is over 155 pounds." She states that she does have a set of scales at home and has been getting her weights  > 155. However, she says that she only took the metolazone those 3 days and then has not taken anymore. She says that the amount of swelling today is less than what it was the day she saw "Dr. Loletha Grayer"  on 10/06/14. I told her to plan to take the metolazone on Monday Wednesday Friday. She can adjust this dosing based on the amount of swelling present and the weights that she is getting on her scales. Told her she can either follow-up with me or with Dr. Lurline Del office regarding this.  3. Right hip pain Will obtain hip X-Ray to evaluate pain.  - DG Hip Complete Right; Future   Plan for her to schedule a routine follow-up office visit in about 3 months time. Follow-up sooner if needed.  Signed, [address] [place], [place], [facility] 10/18/2014 2:50 PM

## 2014-10-19 ENCOUNTER — Ambulatory Visit
Admission: RE | Admit: 2014-10-19 | Discharge: 2014-10-19 | Disposition: A | Discharge status: Home / Self Care | Payer: Federal, State, Local not specified - PPO | Source: Ambulatory Visit | Attending: Physician Assistant | Admitting: Physician Assistant

## 2014-10-19 DIAGNOSIS — M25551 Pain in right hip: Secondary | ICD-10-CM

## 2014-10-20 ENCOUNTER — Telehealth (HOSPITAL_COMMUNITY): Payer: Self-pay

## 2014-10-20 ENCOUNTER — Telehealth: Payer: Self-pay | Admitting: Physician Assistant

## 2014-10-20 DIAGNOSIS — M1611 Unilateral primary osteoarthritis, right hip: Secondary | ICD-10-CM

## 2014-10-20 MED ORDER — MELOXICAM 7.5 MG PO TABS: 7.5000 mg | ORAL_TABLET | Freq: Every day | ORAL | Status: DC

## 2014-10-20 NOTE — Telephone Encounter (Signed)
Encounter complete. 

## 2014-10-20 NOTE — Telephone Encounter (Signed)
-----   Message from Orlena Sheldon, PA-C sent at 10/20/2014  8:00 AM EST ----- Tell patient that x-ray of hip just shows some degenerative change which is just secondary to wear and tear over the years. Tell her that the pain she is experiencing is consistent with this and consistent with arthritis of the hip. I reviewed that her kidney function is normal on her last set of labs. She is on no anti-inflammatory. Have her start Mobic 7.5 mg 1 by mouth daily with food #30 with 11 refills

## 2014-10-20 NOTE — Telephone Encounter (Signed)
Pt aware of lab and xray results and provider recommendations.  Rx to pharmacy

## 2014-10-20 NOTE — Telephone Encounter (Signed)
Patient is calling to see if we have xray results from yesterday  [phone]

## 2014-10-22 ENCOUNTER — Encounter (HOSPITAL_COMMUNITY): Payer: Federal, State, Local not specified - PPO

## 2014-11-08 ENCOUNTER — Ambulatory Visit (INDEPENDENT_AMBULATORY_CARE_PROVIDER_SITE_OTHER): Payer: Federal, State, Local not specified - PPO | Admitting: Cardiology

## 2014-11-08 ENCOUNTER — Encounter: Payer: Self-pay | Admitting: Cardiology

## 2014-11-08 VITALS — BP 115/63 | HR 90 | Ht 63.0 in | Wt 153.4 lb

## 2014-11-08 DIAGNOSIS — I208 Other forms of angina pectoris: Secondary | ICD-10-CM

## 2014-11-08 DIAGNOSIS — I5032 Chronic diastolic (congestive) heart failure: Secondary | ICD-10-CM

## 2014-11-08 DIAGNOSIS — I25119 Atherosclerotic heart disease of native coronary artery with unspecified angina pectoris: Secondary | ICD-10-CM

## 2014-11-08 DIAGNOSIS — M25559 Pain in unspecified hip: Secondary | ICD-10-CM | POA: Insufficient documentation

## 2014-11-08 DIAGNOSIS — M25551 Pain in right hip: Secondary | ICD-10-CM

## 2014-11-08 NOTE — Assessment & Plan Note (Signed)
Continues to have anginal episodes relief with NTG.  She is scheduled for lexiscan myoview on 11/16/14.  Will check with Dr. Bertrum Sol - if more prudent to just proceed with cardiac cath with known 63 year old grafts and ongoing angina.  But could clear once one of these procedures completed.

## 2014-11-08 NOTE — Assessment & Plan Note (Signed)
Currently stable, euvolemic

## 2014-11-08 NOTE — Assessment & Plan Note (Signed)
Plan for surgery with Dr. Dorna Leitz with [facility].  Will clear once test completed.

## 2014-11-08 NOTE — Progress Notes (Signed)
11/08/2014   PCP: Karis Juba, PA-C   Chief Complaint  Patient presents with  . Follow-up    pt denies chest pain, sob, and swelling...Marland KitchenMarland Kitchen pt is having right hip replacement. Dorna Leitz with [facility] will be doing the surgery.    Primary Cardiologist:Dr. Bertrum Sol   HPI:  63 year old female with recent echocardiogram shows that she still has normal left ventricular systolic function and had clear evidence of increased filling pressures (pseudo-normal mitral inflow).  She underwent coronary artery bypass surgery approximately 21 years ago. She has known chronic total bilateral carotid occlusion - her entire cerebral vascular blood supply is via large vertebral arteries. She has a remote history of diabetes mellitus and hypertension and hyperlipidemia. After gastric bypass surgery and substantial weight loss she has been very well compensated and has not required medications for these disorders in a few years. 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 Until recently, she was taking furosemide only on the average once a week for a random occasional ankle edema.  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  21 years after bypass surgery she has again began to develop angina pectoris. This is associated with clear evidence of volume overload and heart failure exacerbation. It seems to have improved with diuresis making it more likely that her angina is due to subendocardial ischemia in the setting of high filling pressure rather than the loss of bypass  conduits. However she is responding rather poorly to treatment, will check a nuclear perfusion study.  It had been scheduled but she had to reschedule.    She is here today for surgical clearance.  She is still having angina and need 2 NTG sprays to resolve the pain.  One episode did go to her back.  She has no associated symptoms on nausea, increased SOB, or diaphoresis.  Her hip is causing her a lot of pain she can hardly get up out of the chair to get on the exam table. The timing of the surgery has not yet been scheduled.   Allergies  Allergen Reactions  . Latex   . Metoclopramide Hcl   . Neurontin [Gabapentin]   . Penicillins     Current Outpatient Prescriptions  Medication Sig Dispense Refill  . acetaminophen (TYLENOL) 500 MG tablet Take 1,500 mg by mouth every 6 (six) hours as needed for mild pain.     . Calcium Carb-Cholecalciferol (CALCIUM 1000 + D PO) Take 1 tablet by mouth daily.    . citalopram (CELEXA) 10 MG tablet Take 10 mg by mouth daily.    . Cyanocobalamin (VITAMIN B-12) 1000 MCG SUBL Place under the tongue as needed.    . ERY-TAB 250 MG EC tablet Take 250 mg by mouth daily as needed.     . furosemide (LASIX) 80 MG tablet Take 1 tablet (80 mg total) by mouth daily. 30 tablet 5  . meloxicam (MOBIC) 7.5 MG tablet Take 1 tablet (7.5 mg total) by mouth daily. 30 tablet 11  . metolazone (ZAROXOLYN) 2.5 MG tablet Take one tablet daily  X 3 days then as needed if weight  is greater than 155 lbs. 90 tablet 3  . morphine (MS CONTIN) 30 MG 12 hr tablet Take 30 mg by mouth 2 (two) times daily. q 12 hours    . NEXIUM 40 MG capsule Take 1 capsule by mouth daily.    . nitroGLYCERIN (NITROLINGUAL) 0.4 MG/SPRAY spray USE ONE TO TWO SPRAYS UNDER THE TONGUE EVERY 5 MINUTES AS NEEDED FOR CHEST PAIN 12 g 5  . potassium chloride (K-DUR) 10 MEQ tablet Take 1 tablet (10 mEq total) by mouth daily. 30 tablet 5   No current facility-administered medications for this visit.    Past Medical  History  Diagnosis Date  . Ischemic heart disease   . Diastolic dysfunction   . Chronic anemia   . History of recurrent UTIs   . Peroneal neuropathy   . Retinopathy   . Stroke   . Hypocalcemia   . Morbid obesity     s/p gastric bypass  . Depression   . CAD s/p CABG 1994 09/25/2013    LIMA-LAD, SVG Diag, SVG-RCA, [facility]rickson Cath 2010: Stents in the proximal LAD with 40% in-stent restenosis and 50% post-stent stenosis, 80% small OM 1, 70% small OM 2, 40% first PLA; diffuse 70% distal RCA; patent SVG to diagonal, patent SVG to distal RCA, patent atretic LIMA EF 55%; normal nuclear stress test 2011  . Bilateral carotid artery occlusion 09/25/2013  . 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 Allergy   . Anxiety   . Asthma   . Cataract   . CHF (congestive heart failure)   . Diabetes mellitus without complication   . GERD (gastroesophageal reflux disease)   . Glaucoma   . Heart murmur   . Hypertension   . Hyperlipidemia   . Osteoporosis     Past Surgical History  Procedure Laterality Date  . Ventral hernia repair    . Appendectomy    . Vaginal hysterectomy    . Coronary artery bypass graft  1994    LIM to LAD,SVG to first diagonal,SVG to distal RCA  . Cardiac catheterization  03/30/2009    3 vessel CAD,patent grafts  . Gastric bypass  2002  . Cholecystectomy    . Heel spur excision  2012  . Trigger finger release  2012  . Radiofrequency ablation      AVNRT - successful  . Nm myocar perf wall motion  2011    no ischemia  . US echocardiography  05/09/2010    mild MR,mild mitral and aortic sclerosis  . Small intestine surgery      HUD:JSHFWYO:VZ colds or fevers, + weight loss Skin:no rashes or ulcers HEENT:no blurred vision, no congestion CV:see HPI PUL:see HPI GI:no diarrhea constipation or melena, no indigestion GU:no hematuria, no dysuria MS:+ rt hip pain, no claudication Neuro:no syncope, no lightheadedness Endo:no diabetes, no thyroid disease  Wt  Readings from Last 3 Encounters:  11/08/14 153 lb 6.4 oz (69.582 kg)  10/18/14 165 lb (74.844 kg)  10/06/14 161 lb 9.6 oz (73.301 kg)    PHYSICAL EXAM BP 115/63 mmHg  Pulse 90  Ht 5\' 3"  (1.6 m)  Wt 153 lb 6.4 oz (69.582 kg)  BMI 27.18 kg/m2 General:Pleasant affect, NAD except with movement.  + rt hip pain. Skin:Warm and dry, brisk capillary refill HEENT:normocephalic, sclera clear, mucus membranes moist Neck:supple, no JVD, no bruits  Heart:S1S2 RRR without murmur, gallup, rub or click Lungs:clear without rales, rhonchi, or wheezes CHY:IFOY, non tender, + BS, do not palpate liver spleen or masses  Ext:no lower ext edema, 2+ pedal pulses, 2+ radial pulses Neuro:alert and oriented, MAE, follows commands, + facial symmetry   BDH:DIXB today,  No changes on EKG done in October.  She is for nuc study 11/16/14  ASSESSMENT AND PLAN Angina decubitus Continues to have anginal episodes relief with NTG.  She is scheduled for lexiscan myoview on 11/16/14.  Will check with Dr. Bertrum Sol - if more prudent to just proceed with cardiac cath with known 64 year old grafts and ongoing angina.  But could clear once one of these procedures completed.    CAD s/p CABG 1994 LIMA-LAD, SVG Diag, SVG-RCA, Hendrickson Cath 2010: Stents in the proximal LAD with 40% in-stent restenosis and 50% post-stent stenosis, 80% small OM 1, 70% small OM 2, 40% first PLA; diffuse 70% distal RCA; patent SVG to diagonal, patent SVG to distal RCA, patent atretic LIMA EF 55%; normal nuclear stress test 2011 Plan for nuc study  Hip joint pain Plan for surgery with Dr. Dorna Leitz with [facility].  Will clear once test completed.  Chronic diastolic heart failure Currently stable, euvolemic

## 2014-11-08 NOTE — Patient Instructions (Signed)
Keep your appointment for the Nuclear Stress Test.  We will call with the results.  Keep your appointment with Dr. Sallyanne Kuster in January.

## 2014-11-08 NOTE — Assessment & Plan Note (Signed)
LIMA-LAD, SVG Diag, SVG-RCA, Hendrickson Cath 2010: Stents in the proximal LAD with 40% in-stent restenosis and 50% post-stent stenosis, 80% small OM 1, 70% small OM 2, 40% first PLA; diffuse 70% distal RCA; patent SVG to diagonal, patent SVG to distal RCA, patent atretic LIMA EF 55%; normal nuclear stress test 2011 Plan for nuc study

## 2014-11-10 ENCOUNTER — Telehealth (HOSPITAL_COMMUNITY): Payer: Self-pay

## 2014-11-10 NOTE — Telephone Encounter (Signed)
Encounter complete. 

## 2014-11-15 ENCOUNTER — Ambulatory Visit: Payer: Federal, State, Local not specified - PPO | Admitting: Cardiology

## 2014-11-15 ENCOUNTER — Ambulatory Visit: Payer: Federal, State, Local not specified - PPO | Admitting: Cardiovascular Disease

## 2014-11-16 ENCOUNTER — Ambulatory Visit (HOSPITAL_COMMUNITY)
Admission: RE | Admit: 2014-11-16 | Discharge: 2014-11-16 | Disposition: A | Discharge status: Home / Self Care | Payer: Federal, State, Local not specified - PPO | Source: Ambulatory Visit | Attending: Cardiovascular Disease | Admitting: Cardiovascular Disease

## 2014-11-16 DIAGNOSIS — I10 Essential (primary) hypertension: Secondary | ICD-10-CM | POA: Insufficient documentation

## 2014-11-16 DIAGNOSIS — R079 Chest pain, unspecified: Secondary | ICD-10-CM | POA: Insufficient documentation

## 2014-11-16 DIAGNOSIS — R0602 Shortness of breath: Secondary | ICD-10-CM | POA: Insufficient documentation

## 2014-11-16 DIAGNOSIS — Z8249 Family history of ischemic heart disease and other diseases of the circulatory system: Secondary | ICD-10-CM | POA: Insufficient documentation

## 2014-11-16 DIAGNOSIS — I2510 Atherosclerotic heart disease of native coronary artery without angina pectoris: Secondary | ICD-10-CM | POA: Insufficient documentation

## 2014-11-16 DIAGNOSIS — E785 Hyperlipidemia, unspecified: Secondary | ICD-10-CM | POA: Insufficient documentation

## 2014-11-16 DIAGNOSIS — Z951 Presence of aortocoronary bypass graft: Secondary | ICD-10-CM | POA: Diagnosis not present

## 2014-11-16 DIAGNOSIS — Z87891 Personal history of nicotine dependence: Secondary | ICD-10-CM | POA: Insufficient documentation

## 2014-11-16 DIAGNOSIS — E663 Overweight: Secondary | ICD-10-CM | POA: Diagnosis not present

## 2014-11-16 DIAGNOSIS — R0600 Dyspnea, unspecified: Secondary | ICD-10-CM

## 2014-11-16 MED ORDER — REGADENOSON 0.4 MG/5ML IV SOLN
0.4000 mg | Freq: Once | INTRAVENOUS | Status: AC
  Administered 2014-11-16: 0.4 mg via INTRAVENOUS

## 2014-11-16 MED ORDER — AMINOPHYLLINE 25 MG/ML IV SOLN
75.0000 mg | Freq: Once | INTRAVENOUS | Status: AC
  Administered 2014-11-16: 75 mg via INTRAVENOUS

## 2014-11-16 MED ORDER — TECHNETIUM TC 99M SESTAMIBI GENERIC - CARDIOLITE
10.1000 | Freq: Once | INTRAVENOUS | Status: AC | PRN
  Administered 2014-11-16: 10.1 via INTRAVENOUS

## 2014-11-16 MED ORDER — TECHNETIUM TC 99M SESTAMIBI GENERIC - CARDIOLITE
29.6000 | Freq: Once | INTRAVENOUS | Status: AC | PRN
  Administered 2014-11-16: 30 via INTRAVENOUS

## 2014-11-16 NOTE — Procedures (Addendum)
[facility] [address] [place] [address] [phone]  Cardiology Nuclear Med Study  [name] is a 63 y.o. female     MRN : [id number]     DOB: Feb 18, 1951  Procedure Date: 11/16/2014  Nuclear Med Background Indication for Stress Test:  Surgical Clearance, Graft Patency and Stent Patency History:  CAD, CABGx4 1994 Cardiac Risk Factors: Carotid Disease, CVA, Family History - CAD, History of Smoking, Hypertension, Lipids and Overweight  Symptoms:  Chest Pain, DOE and SOB   Nuclear Pre-Procedure Caffeine/Decaff Intake:  1:00am NPO After: 11:00am   IV Site: R Antecubital  IV 0.9% NS with Angio Cath:  22g  Chest Size (in):  n/a IV Started by: Otho Perl, CNMT  Height: 5\' 5"  (1.651 m)  Cup Size: C  BMI:  Body mass index is 26.79 kg/(m^2). Weight:  161 lb (73.029 kg)   Tech Comments:  n/a    Nuclear Med Study 1 or 2 day study: 1 day  Stress Test Type:  [place] Provider:  Sanda Klein, MD   Resting Radionuclide: Technetium 55m Sestamibi  Resting Radionuclide Dose: 10.1 mCi   Stress Radionuclide:  Technetium 15m Sestamibi  Stress Radionuclide Dose: 29.6 mCi           Stress Protocol Rest HR: 62 Stress HR: 81  Rest BP:120/62 Stress BP: 123/67  Exercise Time (min): n/a METS: n/a          Dose of Adenosine (mg):  n/a Dose of Lexiscan: 0.4 mg  Dose of Atropine (mg): n/a Dose of Dobutamine: n/a mcg/kg/min (at max HR)  Stress Test Technologist: Mellody Memos, CCT Nuclear Technologist: Imagene Riches, CNMT   Rest Procedure:  Myocardial perfusion imaging was performed at rest 45 minutes following the intravenous administration of Technetium 55m Sestamibi. Stress Procedure:  The patient received IV Lexiscan 0.4 mg over 15-seconds.  Technetium 46m Sestamibi injected IV at 30-seconds.  Patient experienced shortness of breath, a drop in blood pressure and was administered 75 mg of Aminophylline  IV.There were no significant changes with Lexiscan.  Quantitative spect images were obtained after a 45 minute delay.  Transient Ischemic Dilatation (Normal <1.22):  1.22  QGS EDV:  86 ml QGS ESV:  38 ml LV Ejection Fraction: 56%        Rest ECG: NSR - Normal EKG  Stress ECG: No significant change from baseline ECG  QPS Raw Data Images:  Normal; no motion artifact; normal heart/lung ratio. Stress Images:  Normal homogeneous uptake in all areas of the myocardium. Rest Images:  Normal homogeneous uptake in all areas of the myocardium. Subtraction (SDS):  No evidence of ischemia.  Impression Exercise Capacity:  Lexiscan with no exercise. BP Response:  Normal blood pressure response. Clinical Symptoms:  No significant symptoms noted. ECG Impression:  No significant ST segment change suggestive of ischemia. Comparison with Prior Nuclear Study: No significant change from previous study  Overall Impression:  Low risk stress nuclear study Mild symmetric anterior and lateral mild hypoperfusion c/w breast attenuation artifact.  LV Wall Motion:  NL LV Function; NL Wall Motion   [name], MD  11/16/2014 5:12 PM

## 2014-11-17 ENCOUNTER — Telehealth: Payer: Self-pay | Admitting: Family Medicine

## 2014-11-17 NOTE — Telephone Encounter (Signed)
-----   Message from [place] sent at 11/17/2014  1:55 PM EST ----- Patient is returning your call  [phone]

## 2014-11-17 NOTE — Telephone Encounter (Signed)
Pt was referred to Ortho due to hip pain.  Ortho has sent back evaluation and wants to proceed with [facility] in January. They are requesting medical clearance.  I called and left pt message to call back and schedule per provider.

## 2014-11-17 NOTE — Telephone Encounter (Signed)
Pt called back and is aware that appt is needed.

## 2014-11-22 ENCOUNTER — Other Ambulatory Visit: Payer: Self-pay | Admitting: Physician Assistant

## 2014-11-22 ENCOUNTER — Encounter: Payer: Self-pay | Admitting: Physician Assistant

## 2014-11-22 ENCOUNTER — Ambulatory Visit (INDEPENDENT_AMBULATORY_CARE_PROVIDER_SITE_OTHER): Payer: Federal, State, Local not specified - PPO | Admitting: Physician Assistant

## 2014-11-22 VITALS — BP 118/60 | HR 80 | Temp 98.8°F | Resp 18 | Wt 160.0 lb

## 2014-11-22 DIAGNOSIS — M25551 Pain in right hip: Secondary | ICD-10-CM

## 2014-11-22 DIAGNOSIS — E43 Unspecified severe protein-calorie malnutrition: Secondary | ICD-10-CM

## 2014-11-22 DIAGNOSIS — Z9884 Bariatric surgery status: Secondary | ICD-10-CM

## 2014-11-22 DIAGNOSIS — L03116 Cellulitis of left lower limb: Secondary | ICD-10-CM

## 2014-11-22 DIAGNOSIS — F329 Major depressive disorder, single episode, unspecified: Secondary | ICD-10-CM

## 2014-11-22 DIAGNOSIS — I6523 Occlusion and stenosis of bilateral carotid arteries: Secondary | ICD-10-CM

## 2014-11-22 DIAGNOSIS — F32A Depression, unspecified: Secondary | ICD-10-CM

## 2014-11-22 DIAGNOSIS — R748 Abnormal levels of other serum enzymes: Secondary | ICD-10-CM

## 2014-11-22 DIAGNOSIS — D509 Iron deficiency anemia, unspecified: Secondary | ICD-10-CM

## 2014-11-22 DIAGNOSIS — Z01818 Encounter for other preprocedural examination: Secondary | ICD-10-CM

## 2014-11-22 LAB — CBC WITH DIFFERENTIAL/PLATELET
BASOS ABS: 0 10*3/uL (ref 0.0–0.1)
Basophils Relative: 0 % (ref 0–1)
EOS PCT: 1 % (ref 0–5)
Eosinophils Absolute: 0.1 10*3/uL (ref 0.0–0.7)
HEMATOCRIT: 34.8 % — AB (ref 36.0–46.0)
Hemoglobin: 11.8 g/dL — ABNORMAL LOW (ref 12.0–15.0)
Lymphocytes Relative: 32 % (ref 12–46)
Lymphs Abs: 1.9 10*3/uL (ref 0.7–4.0)
MCH: 31.7 pg (ref 26.0–34.0)
MCHC: 33.9 g/dL (ref 30.0–36.0)
MCV: 93.5 fL (ref 78.0–100.0)
MONO ABS: 0.3 10*3/uL (ref 0.1–1.0)
MPV: 10.5 fL (ref 9.4–12.4)
Monocytes Relative: 5 % (ref 3–12)
Neutro Abs: 3.6 10*3/uL (ref 1.7–7.7)
Neutrophils Relative %: 62 % (ref 43–77)
PLATELETS: 190 10*3/uL (ref 150–400)
RBC: 3.72 MIL/uL — ABNORMAL LOW (ref 3.87–5.11)
RDW: 14.3 % (ref 11.5–15.5)
WBC: 5.8 10*3/uL (ref 4.0–10.5)

## 2014-11-22 LAB — COMPLETE METABOLIC PANEL WITH GFR
ALBUMIN: 3.7 g/dL (ref 3.5–5.2)
ALT: 16 U/L (ref 0–35)
AST: 24 U/L (ref 0–37)
Alkaline Phosphatase: 267 U/L — ABNORMAL HIGH (ref 39–117)
BUN: 12 mg/dL (ref 6–23)
CALCIUM: 6.4 mg/dL — AB (ref 8.4–10.5)
CO2: 22 mEq/L (ref 19–32)
Chloride: 108 mEq/L (ref 96–112)
Creat: 0.58 mg/dL (ref 0.50–1.10)
GFR, Est African American: >89 mL/min
GLUCOSE: 123 mg/dL — AB (ref 70–99)
POTASSIUM: 3.7 meq/L (ref 3.5–5.3)
SODIUM: 141 meq/L (ref 135–145)
TOTAL PROTEIN: 6.4 g/dL (ref 6.0–8.3)
Total Bilirubin: 0.6 mg/dL (ref 0.2–1.2)

## 2014-11-22 LAB — TSH: TSH: 1.126 u[IU]/mL (ref 0.350–4.500)

## 2014-11-22 MED ORDER — ERYTHROMYCIN BASE 250 MG PO TBEC
250.0000 mg | DELAYED_RELEASE_TABLET | Freq: Every day | ORAL | Status: DC | PRN
Start: 2014-11-22 — End: 2014-12-13

## 2014-11-22 MED ORDER — CITALOPRAM HYDROBROMIDE 20 MG PO TABS: 20.0000 mg | ORAL_TABLET | Freq: Every day | ORAL | Status: DC

## 2014-11-22 MED ORDER — SULFAMETHOXAZOLE-TRIMETHOPRIM 800-160 MG PO TABS: 1.0000 | ORAL_TABLET | Freq: Two times a day (BID) | ORAL | Status: DC

## 2014-11-22 NOTE — Progress Notes (Signed)
Patient ID: [name] MRN: [id number], DOB: [birth year], 63 y.o. Date of Encounter: @DATE @  Chief Complaint:  Chief Complaint  Patient presents with  . surgical clearence    rt total hip, had cardiac stress test last week, discuss Ery-tab    HPI: 63 y.o. year old white female  presents for office visit because she needs preop/surgical clearance.  I obtained a note from [name] at [facility] dated 11/02/2014. That noted reports that she was having complaints of pain in her right hip/right groin. She had been experiencing worsening pain with activity and it becomes severe. He was waking her from sleep. However was also worse with activity and exercise. Had been ambulating with a cane. 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 He felt that she was an excellent candidate for right anterior total hip replacement. His note stated that he would obtain medical clearance from me.  She has already seen her Cardiologist regarding surgical clearance. There is a note by [name] nurse practitioner dated 11/08/14 in [place]. Patient had a Cardiolite on 11/16/14. She says that she has been called with those results and told was told that it looked good.  She is now here to see me to get remainder of medical clearance.  Today she says that she also has a couple of specific issues that she wanted to discuss with me:  1--She says that her current dose of Celexa takes the edge off of her depression but does not seem to be working as well as it did when she first started it.  However, she says that she also now has more to deal with-- Her son's death etc. She says that she has been going to something called [facility] which has helped her a lot. However is wondering about an adjustment with this medication.  2--She also says that her prior PCP--[name]- would prescribe her some E-Mycin to use as needed. Says that she has a  history of having a boil in her vaginal area and that in the past her physician let her start this as soon as she would feel the boil beginning --she would take the [facility] and it would resolve immediately. She is requesting a refill on this.  3--She also is concerned about her left anterior shin--she has noticed it looking pink the last few days.      SHE HAS ONLY HAD ONE OFFICE VISIT WITH ME PRIOR TO TODAY'S VISIT.  HER INITIAL OFFICE VISIT WITH ME WAS 10/18/2104. THE FOLLOWING IS COPIED FROM THAT OFFICE NOTE:   Pt says that her son, [name], was a pt of mine.  Unfortunately, he passed away over this past year. He had a history of diabetes, renal transplant. I had gotten the death certificate so I knew about his death. Patient says that she did have an autopsy performed and that he did die secondary to his heart. Patient states that she is here to establish primary care with me because she felt that take good care of her son. She says that her current PCP has been [name] at [facility].   She also sees "[name]" at [facility].   She says that the only medication that [name] was prescribing is her Celexa. Says that she has been on this about 5 years and that it was started because of depression. Says that the Celexa is working well. Controls her depression and  it causes no adverse effects.  Patient has 2 concerns that she wanted to evaluate today.  1- she has been having pain in the anterior aspect of her right groin/right hip joint for approximately 2 months now. Says that it is a throbbing pain.  2- also is having some swelling in her ankles. Says that " Dr. Loletha Grayer"  " gave her high powered fluid pill"  recently. She took it daily for 3 days but then has not taken anymore.   Past Medical History  Diagnosis Date  . Ischemic heart disease   . Diastolic dysfunction   . Chronic anemia   . History of recurrent UTIs   . Peroneal neuropathy   . Retinopathy   . Stroke    . Hypocalcemia   . Morbid obesity     s/p gastric bypass  . Depression   . CAD s/p CABG 1994 09/25/2013    LIMA-LAD, SVG Diag, SVG-RCA, [facility] Cath 2010: Stents in the proximal LAD with 40% in-stent restenosis and 50% post-stent stenosis, 80% small OM 1, 70% small OM 2, 40% first PLA; diffuse 70% distal RCA; patent SVG to diagonal, patent SVG to distal RCA, patent atretic LIMA EF 55%; normal nuclear stress test 2011  . Bilateral carotid artery occlusion 09/25/2013  . 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 Allergy   . Anxiety   . Asthma   . Cataract   . CHF (congestive heart failure)   . Diabetes mellitus without complication   . GERD (gastroesophageal reflux disease)   . Glaucoma   . Heart murmur   . Hypertension   . Hyperlipidemia   . Osteoporosis      Home Meds: Outpatient Prescriptions Prior to Visit  Medication Sig Dispense Refill  . acetaminophen (TYLENOL) 500 MG tablet Take 1,500 mg by mouth every 6 (six) hours as needed for mild pain.     . Calcium Carb-Cholecalciferol (CALCIUM 1000 + D PO) Take 1 tablet by mouth daily.    . Cyanocobalamin (VITAMIN B-12) 1000 MCG SUBL Place under the tongue as needed.    . furosemide (LASIX) 80 MG tablet Take 1 tablet (80 mg total) by mouth daily. 30 tablet 5  . metolazone (ZAROXOLYN) 2.5 MG tablet Take one tablet daily  X 3 days then as needed if weight is greater than 155 lbs. 90 tablet 3  . morphine (MS CONTIN) 30 MG 12 hr tablet Take 30 mg by mouth every 8 (eight) hours as needed.     Marland Kitchen NEXIUM 40 MG capsule Take 1 capsule by mouth daily.    . nitroGLYCERIN (NITROLINGUAL) 0.4 MG/SPRAY spray USE ONE TO TWO SPRAYS UNDER THE TONGUE EVERY 5 MINUTES AS NEEDED FOR CHEST PAIN 12 g 5  . potassium chloride (K-DUR) 10 MEQ tablet Take 1 tablet (10 mEq total) by mouth daily. 30 tablet 5  . citalopram (CELEXA) 10 MG tablet Take 10 mg by mouth daily.    . ERY-TAB 250 MG EC tablet Take 250 mg by mouth daily as needed.     . meloxicam  (MOBIC) 7.5 MG tablet Take 1 tablet (7.5 mg total) by mouth daily. (Patient not taking: Reported on 11/22/2014) 30 tablet 11   No facility-administered medications prior to visit.    Allergies:  Allergies  Allergen Reactions  . Latex   . Metoclopramide Hcl   . Neurontin [Gabapentin]   . Penicillins     History   Social History  . Marital Status: Divorced    Spouse Name:  N/A    Number of Children: 2  . Years of Education: N/A   Occupational History  . disabled    Social History Main Topics  . Smoking status: Former Smoker    Quit date: 12/18/1987  . Smokeless tobacco: Never Used  . Alcohol Use: No  . Drug Use: No  . Sexual Activity: Not Currently   Other Topics Concern  . Not on file   Social History Narrative   She used to work with the  [employer]. 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  Family History  Problem Relation Age of Onset  . Diabetes Brother   . Asthma Brother   . Depression Brother   . Asthma Brother   . Depression Brother   . Diabetes Brother   . Cancer Father     skin  . Alcohol abuse Father   . Arthritis Father   . Depression Father   . Hypertension Father   . Diabetes Mother   . Heart failure Mother   . Arthritis Mother   . Heart disease Mother   . Asthma Sister   . Heart attack Sister   . Depression Sister   . Diabetes Sister   . Drug abuse Sister   . Heart disease Sister   . Mental illness Sister   . Heart attack Son   . Hypertension Son   . Kidney disease Son     transplant 2003  . Vision loss Son      Review of Systems:  See HPI for pertinent ROS. All other ROS negative.    Physical Exam: Blood pressure 118/60, pulse 80, temperature 98.8 F (37.1 Sloan), temperature source Oral, resp. rate 18, weight 160 lb (72.576 kg)., Body mass index is 26.63 kg/(m^2). General: WNWD WF. Appears in no  acute distress. Neck: Supple. No thyromegaly. No lymphadenopathy. No carotid bruits.  Lungs: Clear bilaterally to auscultation without wheezes, rales, or rhonchi. Breathing is unlabored. Heart: RRR with S1 S2. No murmurs, rubs, or gallops. Abdomen: Soft, non-tender, non-distended with normoactive bowel sounds. No hepatomegaly. No rebound/guarding. No obvious abdominal masses. Musculoskeletal:  Strength and tone normal for age. Extremities/Skin:  She has tiny purple superficial varicose veins on bilateral feet approx ankle level.    Left Anterior Shin--lower half-- has approx 4 inch x 3inch area of pink erythema and slight warmth.  Neuro: Alert and oriented X 3. Moves all extremities spontaneously. Gait is normal. CNII-XII grossly in tact. Psych:  Responds to questions appropriately with a normal affect.     ASSESSMENT AND PLAN:  63 y.o. year old female with  1. Preop examination Will check labs to further assess the following.  Need to make sure that all of these problems are stable prior to her surgery.            Iron deficiency anemia  - CBC with Differential             H/O gastric bypass  COMPLETE METABOLIC PANEL WITH GFR  TSH              Vit D  25 hydroxy (rtn osteoporosis monitoring)  -Parathyroid hormone, intact (no Ca)  -CBC with Differential    Hypocalcemia  - COMPLETE METABOLIC PANEL WITH GFR  - TSH  - Vit D  25 hydroxy (rtn osteoporosis monitoring)  - Parathyroid hormone, intact (no Ca)  Protein-calorie malnutrition, severe       Elevated alkaline phosphatase level             COMPLETE METABOLIC PANEL WITH GFR    9. Cellulitis of leg, left Is allergic to penicillin. Therefore will treat with Bactrim. She is to start the Bactrim today and take as directed. Follow-up if site worsens at all or if does not completely resolve at completion of antibiotics. - sulfamethoxazole-trimethoprim (BACTRIM DS,SEPTRA DS) 800-160 MG per tablet; Take 1 tablet by mouth 2  (two) times daily.  Dispense: 28 tablet; Refill: 0  10. Depression She is on very low-dose Celexa. Will increase Celexa to 20 mg and follow-up her response to this dose.  Continue to go to [employer].  - citalopram (CELEXA) 20 MG tablet; Take 1 tablet (20 mg total) by mouth daily.  Dispense: 30 tablet; Refill: 3    11. Bilateral carotid artery occlusion--Per Dr. Loletha Grayer 12. CAD--H/O CABG----Per Dr. Loletha Grayer 13. AVRNT--S/P Ablation--Per Dr. Loletha Grayer 14. Chronic Diastolic Heart Failure--Per Dr. Loletha Grayer   8. Hip joint pain, right Per Dr. Berenice Primas.  Will f/u with him once she is medically stable to undergo surgery.   I will follow-up with her once I get these lab results. She will need treatment for her hypocalcemia prior to surgery.  If her alkaline phosphatase is still elevated will need evaluation of this as well.  As well I will need to follow-up with her to make sure that the left anterior shin cellulitis resolves.  [address] [place], [place], [facility] 11/22/2014 4:43 PM

## 2014-11-23 LAB — VITAMIN D 25 HYDROXY (VIT D DEFICIENCY, FRACTURES): Vit D, 25-Hydroxy: <4 ng/mL — ABNORMAL LOW (ref 30–100)

## 2014-11-23 LAB — MAGNESIUM: MAGNESIUM: 1.9 mg/dL (ref 1.5–2.5)

## 2014-11-23 LAB — PARATHYROID HORMONE, INTACT (NO CA): PTH: 416 pg/mL — ABNORMAL HIGH (ref 14–64)

## 2014-11-23 LAB — PHOSPHORUS: Phosphorus: 3.4 mg/dL (ref 2.3–4.6)

## 2014-11-25 ENCOUNTER — Telehealth: Payer: Self-pay | Admitting: Family Medicine

## 2014-11-25 DIAGNOSIS — E559 Vitamin D deficiency, unspecified: Secondary | ICD-10-CM

## 2014-11-25 DIAGNOSIS — M810 Age-related osteoporosis without current pathological fracture: Secondary | ICD-10-CM

## 2014-11-25 MED ORDER — VITAMIN D (ERGOCALCIFEROL) 1.25 MG (50000 UNIT) PO CAPS: 50000.0000 [IU] | ORAL_CAPSULE | ORAL | Status: DC

## 2014-11-25 NOTE — Telephone Encounter (Signed)
-----  Message from Orlena Sheldon, PA-C sent at 11/24/2014  2:02 PM EST ----- Corrected calcium =  6.64.  Mg and Phosphate were added--results shown seperately; both are normal.  I called patient to verify some information. Regarding Nexium, she says that she tried to go without it but had terrible reflux symptoms so she currently is taking Nexium. (discussed with there that PPIs can decrease absorption of certain calcium supplements) Regarding vitamin D deficiency I asked her if she recalls being on vitamin D supplementation anytime recent and she says that she has not been on any higher dose of Vitamin D than what is contained in her current Calcium+D supplement. . Told her that her past medical history has documented osteoporosis. She says that she was on medication for this in the past but thinks that it was at least 10 years ago. She does not recall how long she was on the treatment or why it was stopped. She does not know the date of her last bone density test but thinks that it was greater than 10 years ago. She is agreeable to undergo a follow-up bone density test. Place order for DEXA/BMD-- diagnosis is osteoporosis Place order for vitamin D-- 50,000 international units 1 time every week for 15 weeks #4 with 3 refills. Place order to recheck CMET , Vitamin D, and PTH  in 12 weeks (Alk Phos should decrease as bone remodeling decreases. PTH should decrease as Ca and Vit D levels increase)--Will f/u all of these at lab in 3 months.  When I called the patient, I also told her to take 1500-2000 mg of ELEMENTAL calcium daily in divided doses. I told her to make sure that she is getting that amount of the ELEMENTAL calcium.  Also told her to make sure to get the Calcium CITRATE as this one is absorbed even when taking PPI. I explained this to her in detail and make sure to told her to make sure to get the specific calcium CITRATE. She saw me for office visit for preop/presurgical clearance. This surgery  will need to be placed on hold until we get her vitamin D and calcium levels increased.

## 2014-11-26 ENCOUNTER — Telehealth: Payer: Self-pay | Admitting: Physician Assistant

## 2014-11-26 MED ORDER — DOXYCYCLINE HYCLATE 100 MG PO TABS: 100.0000 mg | ORAL_TABLET | Freq: Two times a day (BID) | ORAL | Status: DC

## 2014-11-26 NOTE — Telephone Encounter (Signed)
Pt. called and made aware of new rx. 

## 2014-11-26 NOTE — Telephone Encounter (Signed)
Patient says that the sulfa drug that she was prescribed is making her sick and dizzy would like a call back from a nurse  [phone]

## 2014-11-26 NOTE — Telephone Encounter (Signed)
Switch to doxycyline 100 bid for 7 days

## 2014-11-26 NOTE — Telephone Encounter (Signed)
Bactrim is making her dizzy and vomiting.  Told to stop taking until i call her with provider recommendations.

## 2014-11-30 ENCOUNTER — Encounter: Payer: Self-pay | Admitting: *Deleted

## 2014-11-30 ENCOUNTER — Encounter: Payer: Self-pay | Admitting: Cardiovascular Disease

## 2014-12-06 ENCOUNTER — Other Ambulatory Visit: Payer: Self-pay | Admitting: Orthopedic Surgery

## 2014-12-13 ENCOUNTER — Telehealth: Payer: Self-pay | Admitting: Family Medicine

## 2014-12-13 DIAGNOSIS — F329 Major depressive disorder, single episode, unspecified: Secondary | ICD-10-CM

## 2014-12-13 DIAGNOSIS — F32A Depression, unspecified: Secondary | ICD-10-CM

## 2014-12-13 MED ORDER — CALCIUM CITRATE 1040 MG PO TABS: 1.0000 | ORAL_TABLET | Freq: Two times a day (BID) | ORAL | Status: DC

## 2014-12-13 MED ORDER — CITALOPRAM HYDROBROMIDE 20 MG PO TABS: 20.0000 mg | ORAL_TABLET | Freq: Every day | ORAL | Status: DC

## 2014-12-13 NOTE — Telephone Encounter (Signed)
Medication refilled per protocol. 

## 2014-12-14 ENCOUNTER — Encounter: Payer: Self-pay | Admitting: Cardiovascular Disease

## 2014-12-15 ENCOUNTER — Other Ambulatory Visit (HOSPITAL_COMMUNITY): Payer: Self-pay | Admitting: *Deleted

## 2014-12-15 ENCOUNTER — Telehealth: Payer: Self-pay | Admitting: *Deleted

## 2014-12-15 NOTE — Telephone Encounter (Signed)
Faxed cardiac clearance low risk for her upcoming hip surgery.  She does require usual DVT/PE prophylaxis.  Faxed to Dr. Berenice Primas.

## 2014-12-15 NOTE — Pre-Procedure Instructions (Addendum)
[name]  12/15/2014   Your procedure is scheduled on:  Monday, December 27, 2014 at 12:30 PM.   Report to [facility] Entrance "A" Admitting Office at 10:30 AM.   Call this number if you have problems the morning of surgery: [phone]               Any questions prior to day of surgery, please call [phone] between 8 & 4 PM.    Remember:   Do not eat food or drink liquids after midnight Sunday, 12/26/14   Take these medicines the morning of surgery with A SIP OF WATER: MS Contin for pain,Celexa,and Nexium.   Do not wear jewelry, make-up or nail polish.  Do not wear lotions, powders, or perfumes. You may wear deodorant.  Do not shave 48 hours prior to surgery.   Do not bring valuables to the hospital.  [facility] is not responsible                  for any belongings or valuables.               Contacts, dentures or bridgework may not be worn into surgery.  Leave suitcase in the car. After surgery it may be brought to your room.  For patients admitted to the hospital, discharge time is determined by your                treatment team.          Special Instructions: [facility] - Preparing for Surgery  Before surgery, you can play an important role.  Because skin is not sterile, your skin needs to be as free of germs as possible.  You can reduce the number of germs on you skin by washing with CHG (chlorahexidine gluconate) soap before surgery.  CHG is an antiseptic cleaner which kills germs and bonds with the skin to continue killing germs even after washing.  Please DO NOT use if you have an allergy to CHG or antibacterial soaps.  If your skin becomes reddened/irritated stop using the CHG and inform your nurse when you arrive at Short Stay.  Do not shave (including legs and underarms) for at least 48 hours prior to the first CHG shower.  You may shave your face.  Please follow these instructions carefully:   1.  Shower with CHG Soap the night before surgery  and the                                morning of Surgery.  2.  If you choose to wash your hair, wash your hair first as usual with your       normal shampoo.  3.  After you shampoo, rinse your hair and body thoroughly to remove the                      Shampoo.  4.  Use CHG as you would any other liquid soap.  You can apply chg directly       to the skin and wash gently with scrungie or a clean washcloth.  5.  Apply the CHG Soap to your body ONLY FROM THE NECK DOWN.        Do not use on open wounds or open sores.  Avoid contact with your eyes, ears, mouth and genitals (private parts).  Wash genitals (private parts) with your normal soap.  6.  Wash thoroughly, paying special attention to the area where your surgery        will be performed.  7.  Thoroughly rinse your body with warm water from the neck down.  8.  DO NOT shower/wash with your normal soap after using and rinsing off       the CHG Soap.  9.  Pat yourself dry with a clean towel.            10.  Wear clean pajamas.            11.  Place clean sheets on your bed the night of your first shower and do not        sleep with pets.  Day of Surgery  Do not apply any lotions the morning of surgery.  Please wear clean clothes to the hospital.     Please read over the following fact sheets that you were given: Pain Booklet, Coughing and Deep Breathing, Blood Transfusion Information, MRSA Information and Surgical Site Infection Prevention

## 2014-12-16 ENCOUNTER — Encounter (HOSPITAL_COMMUNITY)
Admission: RE | Admit: 2014-12-16 | Discharge: 2014-12-16 | Disposition: A | Discharge status: Home / Self Care | Payer: Federal, State, Local not specified - PPO | Source: Ambulatory Visit | Attending: Orthopedic Surgery | Admitting: Orthopedic Surgery

## 2014-12-16 ENCOUNTER — Encounter (HOSPITAL_COMMUNITY): Payer: Self-pay

## 2014-12-16 DIAGNOSIS — Z951 Presence of aortocoronary bypass graft: Secondary | ICD-10-CM | POA: Diagnosis not present

## 2014-12-16 DIAGNOSIS — Z01818 Encounter for other preprocedural examination: Secondary | ICD-10-CM

## 2014-12-16 DIAGNOSIS — K219 Gastro-esophageal reflux disease without esophagitis: Secondary | ICD-10-CM | POA: Diagnosis not present

## 2014-12-16 DIAGNOSIS — I471 Supraventricular tachycardia: Secondary | ICD-10-CM | POA: Insufficient documentation

## 2014-12-16 DIAGNOSIS — Z9884 Bariatric surgery status: Secondary | ICD-10-CM | POA: Insufficient documentation

## 2014-12-16 DIAGNOSIS — I517 Cardiomegaly: Secondary | ICD-10-CM | POA: Diagnosis not present

## 2014-12-16 DIAGNOSIS — F419 Anxiety disorder, unspecified: Secondary | ICD-10-CM | POA: Insufficient documentation

## 2014-12-16 DIAGNOSIS — Z87891 Personal history of nicotine dependence: Secondary | ICD-10-CM | POA: Diagnosis not present

## 2014-12-16 DIAGNOSIS — M810 Age-related osteoporosis without current pathological fracture: Secondary | ICD-10-CM | POA: Insufficient documentation

## 2014-12-16 DIAGNOSIS — E119 Type 2 diabetes mellitus without complications: Secondary | ICD-10-CM | POA: Diagnosis not present

## 2014-12-16 DIAGNOSIS — I5030 Unspecified diastolic (congestive) heart failure: Secondary | ICD-10-CM | POA: Insufficient documentation

## 2014-12-16 DIAGNOSIS — R011 Cardiac murmur, unspecified: Secondary | ICD-10-CM | POA: Insufficient documentation

## 2014-12-16 DIAGNOSIS — Q245 Malformation of coronary vessels: Secondary | ICD-10-CM | POA: Insufficient documentation

## 2014-12-16 DIAGNOSIS — I10 Essential (primary) hypertension: Secondary | ICD-10-CM | POA: Diagnosis not present

## 2014-12-16 DIAGNOSIS — I6523 Occlusion and stenosis of bilateral carotid arteries: Secondary | ICD-10-CM | POA: Insufficient documentation

## 2014-12-16 DIAGNOSIS — Z6827 Body mass index (BMI) 27.0-27.9, adult: Secondary | ICD-10-CM | POA: Diagnosis not present

## 2014-12-16 DIAGNOSIS — D649 Anemia, unspecified: Secondary | ICD-10-CM | POA: Diagnosis not present

## 2014-12-16 DIAGNOSIS — I25810 Atherosclerosis of coronary artery bypass graft(s) without angina pectoris: Secondary | ICD-10-CM | POA: Diagnosis not present

## 2014-12-16 DIAGNOSIS — J45909 Unspecified asthma, uncomplicated: Secondary | ICD-10-CM | POA: Insufficient documentation

## 2014-12-16 DIAGNOSIS — E785 Hyperlipidemia, unspecified: Secondary | ICD-10-CM | POA: Diagnosis not present

## 2014-12-16 DIAGNOSIS — F329 Major depressive disorder, single episode, unspecified: Secondary | ICD-10-CM | POA: Insufficient documentation

## 2014-12-16 DIAGNOSIS — I2510 Atherosclerotic heart disease of native coronary artery without angina pectoris: Secondary | ICD-10-CM | POA: Diagnosis not present

## 2014-12-16 DIAGNOSIS — Z8673 Personal history of transient ischemic attack (TIA), and cerebral infarction without residual deficits: Secondary | ICD-10-CM | POA: Insufficient documentation

## 2014-12-16 HISTORY — DX: Unspecified osteoarthritis, unspecified site: M19.90

## 2014-12-16 LAB — BASIC METABOLIC PANEL
Anion gap: 6 (ref 5–15)
BUN: 9 mg/dL (ref 6–23)
CO2: 21 mmol/L (ref 19–32)
Calcium: 7.2 mg/dL — ABNORMAL LOW (ref 8.4–10.5)
Chloride: 112 mEq/L (ref 96–112)
Creatinine, Ser: 0.53 mg/dL (ref 0.50–1.10)
GFR calc Af Amer: >90 mL/min (ref >90)
GFR calc non Af Amer: >90 mL/min (ref >90)
Glucose, Bld: 107 mg/dL — ABNORMAL HIGH (ref 70–99)
Potassium: 4.4 mmol/L (ref 3.5–5.1)
SODIUM: 139 mmol/L (ref 135–145)

## 2014-12-16 LAB — CBC
HCT: 34.9 % — ABNORMAL LOW (ref 36.0–46.0)
Hemoglobin: 11.2 g/dL — ABNORMAL LOW (ref 12.0–15.0)
MCH: 30.9 pg (ref 26.0–34.0)
MCHC: 32.1 g/dL (ref 30.0–36.0)
MCV: 96.4 fL (ref 78.0–100.0)
PLATELETS: 129 10*3/uL — AB (ref 150–400)
RBC: 3.62 MIL/uL — ABNORMAL LOW (ref 3.87–5.11)
RDW: 14.8 % (ref 11.5–15.5)
WBC: 4.2 10*3/uL (ref 4.0–10.5)

## 2014-12-16 LAB — SURGICAL PCR SCREEN
MRSA, PCR: NEGATIVE
STAPHYLOCOCCUS AUREUS: NEGATIVE

## 2014-12-16 LAB — PROTIME-INR
INR: 1.34 (ref 0.00–1.49)
Prothrombin Time: 16.8 seconds — ABNORMAL HIGH (ref 11.6–15.2)

## 2014-12-16 LAB — APTT: aPTT: 32 seconds (ref 24–37)

## 2014-12-16 MED ORDER — CHLORHEXIDINE GLUCONATE 4 % EX LIQD: 60.0000 mL | Freq: Once | CUTANEOUS | Status: DC

## 2014-12-16 NOTE — Progress Notes (Signed)
Patient blood specimen positive for antibodies ,need redraw day of surgery.

## 2014-12-20 NOTE — Progress Notes (Addendum)
Anesthesia Chart Review:  Patient is a 64 year old female scheduled for right THA, anterior approach on 12/27/14 by Dr. Berenice Primas.  History includes former smoker, CAD s/p CABG '94, diastolic dysfunction/CHF, AVNRT s/p RF ablation '14, murmur, HTN, HLD, DM2, CVA, asthma, anxiety, depression, anemia, hypocalcemia, GERD, glaucoma, known chronic bilateral carotid occlusion (with entire cerebral vascular blood supply via large vertebral arteries), osteoporosis, gastric bypass surgery.  Current BMI 27.45. Cardiologist is Dr. Sallyanne Kuster who felt she was "low risk" from a CV standpoint. She was seen by Dena Billet, PA-C with [facility] on 11/22/14 for preoperative medical evaluation.  Patient was found to have hypocalcemia and low vitamin D levels.  Dixon, PA-C recommended postponing surgery to allow for treatment with vitamin D and calcium citrate to improve these levels.    09/22/14 EKG: NSR, possible LAE.  11/16/14 nuclear stress test: Overall Impression: Low risk stress nuclear study Mild symmetric anterior and lateral mild hypoperfusion c/w breast attenuation artifact. LV Wall Motion: NL LV Function; NL Wall Motion. EF 56%.  09/27/14 echo:  - Left ventricle: The cavity size was normal. There was mild focal basal hypertrophy of the septum. Systolic function was normal. The estimated ejection fraction was in the range of 55% to 60%.Wall motion was normal; there were no regional wall motionabnormalities. Features are consistent with a pseudonormal leftventricular filling pattern, with concomitant abnormal relaxation and increased filling pressure (grade 2 diastolic dysfunction). - Left atrium: The atrium was moderately dilated.  Cath 03/30/2009: Stents in the proximal LAD with 40% in-stent restenosis and 50% post-stent stenosis, 80% small OM 1, 70% small OM 2, 40% first PLA; diffuse 70% distal RCA; patent SVG to diagonal, patent SVG to distal RCA, patent atretic LIMA, EF 65%; Medical therapy, marginal  vessels are too small for intervention.  12/16/14 CXR: No active cardiopulmonary disease. Stable mild cardiomegaly.  Preoperative labs noted.  H/H 11.2/34.9, stable.  PLT 129K. Calcium 7.2 (up from 5.8 on 09/27/14 and 6.4 on 11/22/14). Vitamin D, 25-Hydroxy < 4 and PTH was elevated at 416 on 11/22/14. Dixon, PA-C plans to recheck Vitamin D, CMET and PTH in 12 weeks (next visit scheduled for 01/19/15). Her T&S was positive for antibodies so Blood Bank requested redraw on the day of surgery.  Patient has cardiac clearance, but notes indicate that her PCP has recommended postponing surgery.  I have left a message for Manuela Schwartz at Dr. Berenice Primas' office to clarify medical clearance status and let me know.  George [facility] Short Stay Center/Anesthesiology Phone [phone] 12/20/2014 3:42 PM  Addendum: I spoke with Gaspar Skeeters, PA-C with Dr. Berenice Primas' office.  He attempted to talk with PCP Dixon, PA-C, but she was out of the office.  His notes from her indicate her concern for proceeding with surgery was how patient's hypocalcemia would affect anesthesia.  Clair Gulling asked that I get further input from one of our anesthesiologist regarding if he/she felt surgery would need to be delayed or levels rechecked. I reviewed with anesthesiologist Dr. Orene Desanctis. As above, patient has had known chronic hypocalcemia.  She is now on Vitamin D and calcium.  Since this has been a chronic issue, and her calcium level is improving it is anticipated that she can proceed as planned if no new issues or symptoms arise.  Recommend repeating calcium post-operatively.  Jim notified earlier this afternoon.   George Hugh [facility] Short Stay Center/Anesthesiology Phone [phone] 12/21/2014 6:54 PM

## 2014-12-26 MED ORDER — CLINDAMYCIN PHOSPHATE 900 MG/50ML IV SOLN
900.0000 mg | INTRAVENOUS | Status: AC
  Administered 2014-12-27: 900 mg via INTRAVENOUS
  Filled 2014-12-26: qty 50

## 2014-12-26 NOTE — Anesthesia Preprocedure Evaluation (Addendum)
Anesthesia Evaluation  Patient identified by MRN, date of birth, ID band Patient awake    Reviewed: Allergy & Precautions, NPO status , Patient's Chart, lab work & pertinent test results, reviewed documented beta blocker date and time   Airway Mallampati: II   Neck ROM: Full    Dental  (+) Partial Upper   Pulmonary former smoker (quit 1989),  breath sounds clear to auscultation        Cardiovascular hypertension, Pt. on medications + CAD CABG: 1994. Rhythm:Regular  ECHO and STRESS 11/2014 both show EF 60% and low risk on stress.  Has cardiac cl   Neuro/Psych Anxiety Depression Bilateral carotid occlusion.  Circulation vertebral dependent CVA    GI/Hepatic GERD-  Medicated,SP Gastric bypass for obesity   Endo/Other  diabetes  Renal/GU      Musculoskeletal   Abdominal (+)  Abdomen: soft.    Peds  Hematology 11/35 H/H   Anesthesia Other Findings   Reproductive/Obstetrics                           Anesthesia Physical Anesthesia Plan  ASA: III  Anesthesia Plan: General   Post-op Pain Management:    Induction: Intravenous  Airway Management Planned: Oral ETT  Additional Equipment:   Intra-op Plan:   Post-operative Plan: Extubation in OR  Informed Consent: I have reviewed the patients History and Physical, chart, labs and discussed the procedure including the risks, benefits and alternatives for the proposed anesthesia with the patient or authorized representative who has indicated his/her understanding and acceptance.     Plan Discussed with:   Anesthesia Plan Comments:         Anesthesia Quick Evaluation

## 2014-12-27 ENCOUNTER — Inpatient Hospital Stay (HOSPITAL_COMMUNITY): Payer: Federal, State, Local not specified - PPO | Admitting: Anesthesiology

## 2014-12-27 ENCOUNTER — Encounter (HOSPITAL_COMMUNITY)
Admission: RE | Disposition: A | Discharge status: Skilled Nursing Facility | Payer: Self-pay | Source: Ambulatory Visit | Attending: Orthopedic Surgery

## 2014-12-27 ENCOUNTER — Inpatient Hospital Stay (HOSPITAL_COMMUNITY): Payer: Federal, State, Local not specified - PPO

## 2014-12-27 ENCOUNTER — Encounter (HOSPITAL_COMMUNITY): Payer: Self-pay | Admitting: *Deleted

## 2014-12-27 ENCOUNTER — Inpatient Hospital Stay (HOSPITAL_COMMUNITY)
Admission: RE | Admit: 2014-12-27 | Discharge: 2014-12-31 | DRG: 470 | Disposition: A | Discharge status: Skilled Nursing Facility | Payer: Federal, State, Local not specified - PPO | Source: Ambulatory Visit | Attending: Orthopedic Surgery | Admitting: Orthopedic Surgery

## 2014-12-27 ENCOUNTER — Inpatient Hospital Stay (HOSPITAL_COMMUNITY): Payer: Federal, State, Local not specified - PPO | Admitting: Vascular Surgery

## 2014-12-27 DIAGNOSIS — Z888 Allergy status to other drugs, medicaments and biological substances status: Secondary | ICD-10-CM | POA: Diagnosis not present

## 2014-12-27 DIAGNOSIS — E785 Hyperlipidemia, unspecified: Secondary | ICD-10-CM | POA: Diagnosis present

## 2014-12-27 DIAGNOSIS — E119 Type 2 diabetes mellitus without complications: Secondary | ICD-10-CM | POA: Diagnosis present

## 2014-12-27 DIAGNOSIS — Z825 Family history of asthma and other chronic lower respiratory diseases: Secondary | ICD-10-CM | POA: Diagnosis not present

## 2014-12-27 DIAGNOSIS — Z833 Family history of diabetes mellitus: Secondary | ICD-10-CM | POA: Diagnosis not present

## 2014-12-27 DIAGNOSIS — D509 Iron deficiency anemia, unspecified: Secondary | ICD-10-CM | POA: Diagnosis present

## 2014-12-27 DIAGNOSIS — I5032 Chronic diastolic (congestive) heart failure: Secondary | ICD-10-CM | POA: Diagnosis present

## 2014-12-27 DIAGNOSIS — Z87891 Personal history of nicotine dependence: Secondary | ICD-10-CM

## 2014-12-27 DIAGNOSIS — Z951 Presence of aortocoronary bypass graft: Secondary | ICD-10-CM

## 2014-12-27 DIAGNOSIS — Z7982 Long term (current) use of aspirin: Secondary | ICD-10-CM

## 2014-12-27 DIAGNOSIS — Z8673 Personal history of transient ischemic attack (TIA), and cerebral infarction without residual deficits: Secondary | ICD-10-CM | POA: Diagnosis not present

## 2014-12-27 DIAGNOSIS — M810 Age-related osteoporosis without current pathological fracture: Secondary | ICD-10-CM | POA: Diagnosis present

## 2014-12-27 DIAGNOSIS — F329 Major depressive disorder, single episode, unspecified: Secondary | ICD-10-CM | POA: Diagnosis present

## 2014-12-27 DIAGNOSIS — E559 Vitamin D deficiency, unspecified: Secondary | ICD-10-CM | POA: Diagnosis present

## 2014-12-27 DIAGNOSIS — Z419 Encounter for procedure for purposes other than remedying health state, unspecified: Secondary | ICD-10-CM

## 2014-12-27 DIAGNOSIS — Z880 Allergy status to penicillin: Secondary | ICD-10-CM

## 2014-12-27 DIAGNOSIS — Z8249 Family history of ischemic heart disease and other diseases of the circulatory system: Secondary | ICD-10-CM

## 2014-12-27 DIAGNOSIS — I2510 Atherosclerotic heart disease of native coronary artery without angina pectoris: Secondary | ICD-10-CM | POA: Diagnosis present

## 2014-12-27 DIAGNOSIS — M25551 Pain in right hip: Secondary | ICD-10-CM

## 2014-12-27 DIAGNOSIS — I10 Essential (primary) hypertension: Secondary | ICD-10-CM | POA: Diagnosis present

## 2014-12-27 DIAGNOSIS — I471 Supraventricular tachycardia, unspecified: Secondary | ICD-10-CM

## 2014-12-27 DIAGNOSIS — R252 Cramp and spasm: Secondary | ICD-10-CM | POA: Diagnosis not present

## 2014-12-27 DIAGNOSIS — F419 Anxiety disorder, unspecified: Secondary | ICD-10-CM | POA: Diagnosis present

## 2014-12-27 DIAGNOSIS — Z9884 Bariatric surgery status: Secondary | ICD-10-CM | POA: Diagnosis not present

## 2014-12-27 DIAGNOSIS — K219 Gastro-esophageal reflux disease without esophagitis: Secondary | ICD-10-CM | POA: Diagnosis present

## 2014-12-27 DIAGNOSIS — Z91040 Latex allergy status: Secondary | ICD-10-CM

## 2014-12-27 DIAGNOSIS — Z79899 Other long term (current) drug therapy: Secondary | ICD-10-CM

## 2014-12-27 DIAGNOSIS — M1611 Unilateral primary osteoarthritis, right hip: Secondary | ICD-10-CM | POA: Diagnosis present

## 2014-12-27 DIAGNOSIS — D62 Acute posthemorrhagic anemia: Secondary | ICD-10-CM | POA: Diagnosis not present

## 2014-12-27 HISTORY — PX: TOTAL HIP ARTHROPLASTY: SHX124

## 2014-12-27 LAB — GLUCOSE, CAPILLARY
GLUCOSE-CAPILLARY: 82 mg/dL (ref 70–99)
GLUCOSE-CAPILLARY: 166 mg/dL — AB (ref 70–99)
Glucose-Capillary: 147 mg/dL — ABNORMAL HIGH (ref 70–99)

## 2014-12-27 SURGERY — ARTHROPLASTY, HIP, TOTAL, ANTERIOR APPROACH
Anesthesia: General | Site: Hip | Laterality: Right

## 2014-12-27 MED ORDER — GLYCOPYRROLATE 0.2 MG/ML IJ SOLN
INTRAMUSCULAR | Status: AC
  Filled 2014-12-27: qty 4

## 2014-12-27 MED ORDER — HYDROMORPHONE HCL 1 MG/ML IJ SOLN
1.0000 mg | INTRAMUSCULAR | Status: DC | PRN
  Administered 2014-12-28 (×4): 1 mg via INTRAVENOUS
  Administered 2014-12-28: 2 mg via INTRAVENOUS
  Administered 2014-12-29: 1 mg via INTRAVENOUS
  Administered 2014-12-29: 2 mg via INTRAVENOUS
  Administered 2014-12-29: 1 mg via INTRAVENOUS
  Filled 2014-12-27 (×2): qty 2
  Filled 2014-12-27 (×6): qty 1
  Filled 2014-12-27: qty 2
  Filled 2014-12-27 (×2): qty 1

## 2014-12-27 MED ORDER — PROPOFOL 10 MG/ML IV BOLUS
INTRAVENOUS | Status: DC | PRN
  Administered 2014-12-27: 150 mg via INTRAVENOUS

## 2014-12-27 MED ORDER — ONDANSETRON HCL 4 MG PO TABS: 4.0000 mg | ORAL_TABLET | Freq: Four times a day (QID) | ORAL | Status: DC | PRN

## 2014-12-27 MED ORDER — DOCUSATE SODIUM 100 MG PO CAPS
100.0000 mg | ORAL_CAPSULE | Freq: Two times a day (BID) | ORAL | Status: DC
  Administered 2014-12-28 – 2014-12-31 (×5): 100 mg via ORAL
  Filled 2014-12-27 (×12): qty 1

## 2014-12-27 MED ORDER — DEXAMETHASONE SODIUM PHOSPHATE 10 MG/ML IJ SOLN
INTRAMUSCULAR | Status: AC
  Filled 2014-12-27: qty 1

## 2014-12-27 MED ORDER — ACETAMINOPHEN 650 MG RE SUPP: 650.0000 mg | Freq: Four times a day (QID) | RECTAL | Status: DC | PRN

## 2014-12-27 MED ORDER — PANTOPRAZOLE SODIUM 40 MG PO TBEC
80.0000 mg | DELAYED_RELEASE_TABLET | Freq: Every day | ORAL | Status: DC
  Administered 2014-12-28 – 2014-12-31 (×4): 80 mg via ORAL
  Filled 2014-12-27 (×3): qty 2

## 2014-12-27 MED ORDER — MIDAZOLAM HCL 2 MG/2ML IJ SOLN
INTRAMUSCULAR | Status: AC
  Filled 2014-12-27: qty 2

## 2014-12-27 MED ORDER — NEOSTIGMINE METHYLSULFATE 10 MG/10ML IV SOLN
INTRAVENOUS | Status: DC | PRN
  Administered 2014-12-27: 2.5 mg via INTRAVENOUS

## 2014-12-27 MED ORDER — ROCURONIUM BROMIDE 50 MG/5ML IV SOLN
INTRAVENOUS | Status: AC
  Filled 2014-12-27: qty 1

## 2014-12-27 MED ORDER — DEXAMETHASONE SODIUM PHOSPHATE 10 MG/ML IJ SOLN
INTRAMUSCULAR | Status: DC | PRN
  Administered 2014-12-27: 4 mg via INTRAVENOUS

## 2014-12-27 MED ORDER — SODIUM CHLORIDE 0.9 % IV SOLN
INTRAVENOUS | Status: DC
  Administered 2014-12-27 – 2014-12-29 (×2): via INTRAVENOUS

## 2014-12-27 MED ORDER — ONDANSETRON HCL 4 MG/2ML IJ SOLN: 4.0000 mg | Freq: Four times a day (QID) | INTRAMUSCULAR | Status: DC | PRN

## 2014-12-27 MED ORDER — LACTATED RINGERS IV SOLN
INTRAVENOUS | Status: DC
  Administered 2014-12-27 (×3): via INTRAVENOUS

## 2014-12-27 MED ORDER — PROMETHAZINE HCL 25 MG/ML IJ SOLN: 12.5000 mg | Freq: Four times a day (QID) | INTRAMUSCULAR | Status: DC | PRN

## 2014-12-27 MED ORDER — FENTANYL CITRATE 0.05 MG/ML IJ SOLN
INTRAMUSCULAR | Status: AC
  Filled 2014-12-27: qty 2

## 2014-12-27 MED ORDER — MORPHINE SULFATE ER 15 MG PO TBCR
30.0000 mg | EXTENDED_RELEASE_TABLET | Freq: Three times a day (TID) | ORAL | Status: DC | PRN
  Administered 2014-12-27: 30 mg via ORAL
  Filled 2014-12-27: qty 2

## 2014-12-27 MED ORDER — CALCIUM CITRATE 950 (200 CA) MG PO TABS
200.0000 mg | ORAL_TABLET | Freq: Two times a day (BID) | ORAL | Status: DC
  Administered 2014-12-28 – 2014-12-31 (×7): 200 mg via ORAL
  Filled 2014-12-27 (×11): qty 1

## 2014-12-27 MED ORDER — PROMETHAZINE HCL 25 MG/ML IJ SOLN: 6.2500 mg | INTRAMUSCULAR | Status: DC | PRN

## 2014-12-27 MED ORDER — LIDOCAINE HCL (CARDIAC) 20 MG/ML IV SOLN
INTRAVENOUS | Status: AC
  Filled 2014-12-27: qty 5

## 2014-12-27 MED ORDER — ONDANSETRON HCL 4 MG/2ML IJ SOLN
INTRAMUSCULAR | Status: DC | PRN
  Administered 2014-12-27: 4 mg via INTRAVENOUS

## 2014-12-27 MED ORDER — TRANEXAMIC ACID 100 MG/ML IV SOLN: 1000.0000 mg | INTRAVENOUS | Status: DC

## 2014-12-27 MED ORDER — METHOCARBAMOL 500 MG PO TABS: 500.0000 mg | ORAL_TABLET | Freq: Three times a day (TID) | ORAL | Status: DC | PRN

## 2014-12-27 MED ORDER — METHOCARBAMOL 1000 MG/10ML IJ SOLN
500.0000 mg | Freq: Four times a day (QID) | INTRAVENOUS | Status: DC | PRN
  Administered 2014-12-27: 500 mg via INTRAVENOUS
  Filled 2014-12-27 (×2): qty 5

## 2014-12-27 MED ORDER — CALCIUM CITRATE 1040 MG PO TABS: 1.0000 | ORAL_TABLET | Freq: Two times a day (BID) | ORAL | Status: DC

## 2014-12-27 MED ORDER — FENTANYL CITRATE 0.05 MG/ML IJ SOLN
25.0000 ug | INTRAMUSCULAR | Status: DC | PRN
  Administered 2014-12-27 (×4): 50 ug via INTRAVENOUS

## 2014-12-27 MED ORDER — FENTANYL CITRATE 0.05 MG/ML IJ SOLN
INTRAMUSCULAR | Status: AC
  Filled 2014-12-27: qty 5

## 2014-12-27 MED ORDER — FUROSEMIDE 80 MG PO TABS
80.0000 mg | ORAL_TABLET | Freq: Every day | ORAL | Status: DC
  Administered 2014-12-27 – 2014-12-31 (×4): 80 mg via ORAL
  Filled 2014-12-27 (×5): qty 1

## 2014-12-27 MED ORDER — HYDROMORPHONE HCL 1 MG/ML IJ SOLN
INTRAMUSCULAR | Status: DC | PRN
  Administered 2014-12-27 (×2): 0.5 mg via INTRAVENOUS

## 2014-12-27 MED ORDER — SODIUM CHLORIDE 0.9 % IJ SOLN
INTRAMUSCULAR | Status: AC
  Filled 2014-12-27: qty 10

## 2014-12-27 MED ORDER — DIPHENHYDRAMINE HCL 12.5 MG/5ML PO ELIX: 12.5000 mg | ORAL_SOLUTION | ORAL | Status: DC | PRN

## 2014-12-27 MED ORDER — BISACODYL 5 MG PO TBEC: 5.0000 mg | DELAYED_RELEASE_TABLET | Freq: Every day | ORAL | Status: DC | PRN

## 2014-12-27 MED ORDER — POLYETHYLENE GLYCOL 3350 17 G PO PACK: 17.0000 g | PACK | Freq: Every day | ORAL | Status: DC | PRN

## 2014-12-27 MED ORDER — ACETAMINOPHEN 325 MG PO TABS
ORAL_TABLET | ORAL | Status: AC
  Filled 2014-12-27: qty 2

## 2014-12-27 MED ORDER — FENTANYL CITRATE 0.05 MG/ML IJ SOLN
INTRAMUSCULAR | Status: DC | PRN
  Administered 2014-12-27 (×3): 50 ug via INTRAVENOUS
  Administered 2014-12-27: 100 ug via INTRAVENOUS

## 2014-12-27 MED ORDER — EPHEDRINE SULFATE 50 MG/ML IJ SOLN
INTRAMUSCULAR | Status: DC | PRN
  Administered 2014-12-27: 10 mg via INTRAVENOUS
  Administered 2014-12-27: 5 mg via INTRAVENOUS

## 2014-12-27 MED ORDER — TRANEXAMIC ACID 100 MG/ML IV SOLN
2000.0000 mg | INTRAVENOUS | Status: AC
  Administered 2014-12-27: 2000 mg via TOPICAL
  Filled 2014-12-27: qty 20

## 2014-12-27 MED ORDER — POTASSIUM CHLORIDE ER 10 MEQ PO TBCR
10.0000 meq | EXTENDED_RELEASE_TABLET | Freq: Every day | ORAL | Status: DC
  Administered 2014-12-27 – 2014-12-29 (×3): 10 meq via ORAL
  Filled 2014-12-27 (×3): qty 1

## 2014-12-27 MED ORDER — CITALOPRAM HYDROBROMIDE 20 MG PO TABS
20.0000 mg | ORAL_TABLET | Freq: Every day | ORAL | Status: DC
  Administered 2014-12-28 – 2014-12-31 (×4): 20 mg via ORAL
  Filled 2014-12-27 (×4): qty 1

## 2014-12-27 MED ORDER — ONDANSETRON HCL 4 MG/2ML IJ SOLN
INTRAMUSCULAR | Status: AC
  Filled 2014-12-27: qty 2

## 2014-12-27 MED ORDER — GLYCOPYRROLATE 0.2 MG/ML IJ SOLN
INTRAMUSCULAR | Status: DC | PRN
  Administered 2014-12-27: .5 mg via INTRAVENOUS

## 2014-12-27 MED ORDER — ALUM & MAG HYDROXIDE-SIMETH 200-200-20 MG/5ML PO SUSP: 30.0000 mL | ORAL | Status: DC | PRN

## 2014-12-27 MED ORDER — MIDAZOLAM HCL 5 MG/5ML IJ SOLN
INTRAMUSCULAR | Status: DC | PRN
  Administered 2014-12-27: 2 mg via INTRAVENOUS

## 2014-12-27 MED ORDER — 0.9 % SODIUM CHLORIDE (POUR BTL) OPTIME
TOPICAL | Status: DC | PRN
  Administered 2014-12-27: 1000 mL

## 2014-12-27 MED ORDER — MEPERIDINE HCL 25 MG/ML IJ SOLN: 6.2500 mg | INTRAMUSCULAR | Status: DC | PRN

## 2014-12-27 MED ORDER — PROPOFOL 10 MG/ML IV BOLUS
INTRAVENOUS | Status: AC
  Filled 2014-12-27: qty 20

## 2014-12-27 MED ORDER — EPHEDRINE SULFATE 50 MG/ML IJ SOLN
INTRAMUSCULAR | Status: AC
  Filled 2014-12-27: qty 1

## 2014-12-27 MED ORDER — ROCURONIUM BROMIDE 100 MG/10ML IV SOLN
INTRAVENOUS | Status: DC | PRN
  Administered 2014-12-27: 5 mg via INTRAVENOUS
  Administered 2014-12-27: 20 mg via INTRAVENOUS
  Administered 2014-12-27: 10 mg via INTRAVENOUS
  Administered 2014-12-27: 5 mg via INTRAVENOUS
  Administered 2014-12-27: 40 mg via INTRAVENOUS

## 2014-12-27 MED ORDER — HYDROMORPHONE HCL 1 MG/ML IJ SOLN
INTRAMUSCULAR | Status: AC
  Filled 2014-12-27: qty 1

## 2014-12-27 MED ORDER — NITROGLYCERIN 0.4 MG/SPRAY TL SOLN
2.0000 | Status: DC | PRN
  Filled 2014-12-27: qty 4.9

## 2014-12-27 MED ORDER — SODIUM CHLORIDE 0.9 % IJ SOLN
INTRAMUSCULAR | Status: DC | PRN
Start: 2014-12-27 — End: 2014-12-27
  Administered 2014-12-27: 40 mL via INTRAVENOUS

## 2014-12-27 MED ORDER — TRANEXAMIC ACID 100 MG/ML IV SOLN
2000.0000 mg | INTRAVENOUS | Status: DC
  Filled 2014-12-27: qty 20

## 2014-12-27 MED ORDER — ALBUMIN HUMAN 5 % IV SOLN
INTRAVENOUS | Status: DC | PRN
  Administered 2014-12-27: 14:00:00 via INTRAVENOUS

## 2014-12-27 MED ORDER — ZOLPIDEM TARTRATE 5 MG PO TABS: 5.0000 mg | ORAL_TABLET | Freq: Every evening | ORAL | Status: DC | PRN

## 2014-12-27 MED ORDER — NEOSTIGMINE METHYLSULFATE 10 MG/10ML IV SOLN
INTRAVENOUS | Status: AC
  Filled 2014-12-27: qty 1

## 2014-12-27 MED ORDER — ACETAMINOPHEN 325 MG PO TABS
650.0000 mg | ORAL_TABLET | Freq: Four times a day (QID) | ORAL | Status: DC | PRN
  Administered 2014-12-27 – 2014-12-28 (×4): 650 mg via ORAL
  Filled 2014-12-27 (×4): qty 2

## 2014-12-27 MED ORDER — ASPIRIN EC 325 MG PO TBEC
325.0000 mg | DELAYED_RELEASE_TABLET | Freq: Two times a day (BID) | ORAL | Status: DC
Start: 2014-12-27 — End: 2014-12-31
  Administered 2014-12-27 – 2014-12-31 (×8): 325 mg via ORAL
  Filled 2014-12-27 (×10): qty 1

## 2014-12-27 MED ORDER — CLINDAMYCIN PHOSPHATE 600 MG/50ML IV SOLN
600.0000 mg | Freq: Four times a day (QID) | INTRAVENOUS | Status: AC
  Administered 2014-12-27 – 2014-12-28 (×2): 600 mg via INTRAVENOUS
  Filled 2014-12-27 (×2): qty 50

## 2014-12-27 MED ORDER — BUPIVACAINE LIPOSOME 1.3 % IJ SUSP
20.0000 mL | INTRAMUSCULAR | Status: AC
  Administered 2014-12-27: 20 mL
  Filled 2014-12-27: qty 20

## 2014-12-27 MED ORDER — LIDOCAINE HCL (CARDIAC) 20 MG/ML IV SOLN
INTRAVENOUS | Status: DC | PRN
  Administered 2014-12-27: 80 mg via INTRAVENOUS

## 2014-12-27 MED ORDER — METHOCARBAMOL 500 MG PO TABS
500.0000 mg | ORAL_TABLET | Freq: Four times a day (QID) | ORAL | Status: DC | PRN
  Administered 2014-12-28 – 2014-12-31 (×4): 500 mg via ORAL
  Filled 2014-12-27 (×5): qty 1

## 2014-12-27 SURGICAL SUPPLY — 58 items
APL SKNCLS STERI-STRIP NONHPOA (GAUZE/BANDAGES/DRESSINGS) ×1
BENZOIN TINCTURE PRP APPL 2/3 (GAUZE/BANDAGES/DRESSINGS) ×3 IMPLANT
BLADE SAW SGTL 18X1.27X75 (BLADE) ×2 IMPLANT
BLADE SAW SGTL 18X1.27X75MM (BLADE) ×1
BLADE SURG ROTATE 9660 (MISCELLANEOUS) IMPLANT
BNDG COHESIVE 6X5 TAN STRL LF (GAUZE/BANDAGES/DRESSINGS) ×2 IMPLANT
BNDG GAUZE ELAST 4 BULKY (GAUZE/BANDAGES/DRESSINGS) IMPLANT
CAPT HIP TOTAL 2 ×2 IMPLANT
CELLS DAT CNTRL 66122 CELL SVR (MISCELLANEOUS) ×1 IMPLANT
CLOSURE STERI-STRIP 1/2X4 (GAUZE/BANDAGES/DRESSINGS)
CLSR STERI-STRIP ANTIMIC 1/2X4 (GAUZE/BANDAGES/DRESSINGS) IMPLANT
COVER SURGICAL LIGHT HANDLE (MISCELLANEOUS) ×3 IMPLANT
DRAPE C-ARM 42X72 X-RAY (DRAPES) ×3 IMPLANT
DRAPE IMP U-DRAPE 54X76 (DRAPES) ×3 IMPLANT
DRAPE STERI IOBAN 125X83 (DRAPES) ×3 IMPLANT
DRAPE U-SHAPE 47X51 STRL (DRAPES) ×9 IMPLANT
DRSG MEPILEX BORDER 4X8 (GAUZE/BANDAGES/DRESSINGS) ×3 IMPLANT
DURAPREP 26ML APPLICATOR (WOUND CARE) ×3 IMPLANT
ELECT BLADE 4.0 EZ CLEAN MEGAD (MISCELLANEOUS)
ELECT CAUTERY BLADE 6.4 (BLADE) ×3 IMPLANT
ELECT REM PT RETURN 9FT ADLT (ELECTROSURGICAL) ×3
ELECTRODE BLDE 4.0 EZ CLN MEGD (MISCELLANEOUS) IMPLANT
ELECTRODE REM PT RTRN 9FT ADLT (ELECTROSURGICAL) ×1 IMPLANT
GAUZE XEROFORM 1X8 LF (GAUZE/BANDAGES/DRESSINGS) ×3 IMPLANT
GLOVE BIOGEL PI IND STRL 8 (GLOVE) ×2 IMPLANT
GLOVE BIOGEL PI INDICATOR 8 (GLOVE) ×4
GLOVE ECLIPSE 7.5 STRL STRAW (GLOVE) ×6 IMPLANT
GOWN STRL REUS W/ TWL LRG LVL3 (GOWN DISPOSABLE) ×2 IMPLANT
GOWN STRL REUS W/ TWL XL LVL3 (GOWN DISPOSABLE) ×2 IMPLANT
GOWN STRL REUS W/TWL LRG LVL3 (GOWN DISPOSABLE) ×6
GOWN STRL REUS W/TWL XL LVL3 (GOWN DISPOSABLE) ×6
HOOD PEEL AWAY FACE SHEILD DIS (HOOD) ×6 IMPLANT
KIT BASIN OR (CUSTOM PROCEDURE TRAY) ×3 IMPLANT
KIT ROOM TURNOVER OR (KITS) ×3 IMPLANT
MANIFOLD NEPTUNE II (INSTRUMENTS) ×3 IMPLANT
NDL SPNL 22GX3.5 QUINCKE BK (NEEDLE) ×1 IMPLANT
NEEDLE SPNL 22GX3.5 QUINCKE BK (NEEDLE) ×3 IMPLANT
NS IRRIG 1000ML POUR BTL (IV SOLUTION) ×3 IMPLANT
PACK TOTAL JOINT (CUSTOM PROCEDURE TRAY) ×3 IMPLANT
PACK UNIVERSAL I (CUSTOM PROCEDURE TRAY) ×3 IMPLANT
PAD ARMBOARD 7.5X6 YLW CONV (MISCELLANEOUS) ×6 IMPLANT
RETRACTOR WND ALEXIS 18 MED (MISCELLANEOUS) ×1 IMPLANT
RTRCTR WOUND ALEXIS 18CM MED (MISCELLANEOUS) ×3
SPONGE LAP 18X18 X RAY DECT (DISPOSABLE) IMPLANT
STAPLER VISISTAT 35W (STAPLE) IMPLANT
SUT ETHIBOND NAB CT1 #1 30IN (SUTURE) ×6 IMPLANT
SUT MNCRL AB 3-0 PS2 18 (SUTURE) ×3 IMPLANT
SUT VIC AB 0 CT1 27 (SUTURE) ×3
SUT VIC AB 0 CT1 27XBRD ANBCTR (SUTURE) ×1 IMPLANT
SUT VIC AB 1 CT1 27 (SUTURE) ×12
SUT VIC AB 1 CT1 27XBRD ANBCTR (SUTURE) ×4 IMPLANT
SUT VIC AB 2-0 CT1 27 (SUTURE) ×3
SUT VIC AB 2-0 CT1 TAPERPNT 27 (SUTURE) ×1 IMPLANT
SYR 50ML LL SCALE MARK (SYRINGE) ×3 IMPLANT
TOWEL OR 17X24 6PK STRL BLUE (TOWEL DISPOSABLE) ×3 IMPLANT
TOWEL OR 17X26 10 PK STRL BLUE (TOWEL DISPOSABLE) ×3 IMPLANT
TRAY FOLEY CATH 16FR SILVER (SET/KITS/TRAYS/PACK) IMPLANT
WATER STERILE IRR 1000ML POUR (IV SOLUTION) ×6 IMPLANT

## 2014-12-27 NOTE — Transfer of Care (Signed)
Immediate Anesthesia Transfer of Care Note  Patient: [name]  Procedure(s) Performed: Procedure(s): TOTAL HIP ARTHROPLASTY ANTERIOR APPROACH (Right)  Patient Location: PACU  Anesthesia Type:General  Level of Consciousness: sedated  Airway & Oxygen Therapy: Patient Spontanous Breathing and Patient connected to nasal cannula oxygen  Post-op Assessment: Report given to PACU RN and Post -op Vital signs reviewed and stable  Post vital signs: stable  Complications: No apparent anesthesia complications

## 2014-12-27 NOTE — Anesthesia Postprocedure Evaluation (Signed)
  Anesthesia Post-op Note  Patient: [name]  Procedure(s) Performed: Procedure(s): TOTAL HIP ARTHROPLASTY ANTERIOR APPROACH (Right)  Patient Location: PACU  Anesthesia Type:General  Level of Consciousness: awake  Airway and Oxygen Therapy: Patient Spontanous Breathing and Patient connected to nasal cannula oxygen  Post-op Pain: mild  Post-op Assessment: Post-op Vital signs reviewed, Patient's Cardiovascular Status Stable, Respiratory Function Stable and Patent Airway  Post-op Vital Signs: Reviewed and stable  Last Vitals:  Filed Vitals:   12/27/14 1645  BP: 134/50  Pulse: 89  Temp:   Resp: 14    Complications: No apparent anesthesia complications

## 2014-12-27 NOTE — H&P (Signed)
TOTAL HIP ADMISSION H&P  Patient is admitted for right total hip arthroplasty.  Subjective:  Chief Complaint: right hip pain  HPI: [name], 64 y.o. female, has a history of pain and functional disability in the right hip(s) due to arthritis and patient has failed non-surgical conservative treatments for greater than 12 weeks to include NSAID's and/or analgesics, supervised PT with diminished ADL's post treatment, use of assistive devices and weight reduction as appropriate.  Onset of symptoms was gradual starting 3 years ago with rapidlly worsening course since that time.The patient noted no past surgery on the right hip(s).  Patient currently rates pain in the right hip at 8 out of 10 with activity. Patient has night pain, worsening of pain with activity and weight bearing, trendelenberg gait, pain that interfers with activities of daily living, pain with passive range of motion, crepitus and joint swelling. Patient has evidence of joint space narrowing by imaging studies. This condition presents safety issues increasing the risk of falls. This patient has had ffailure of all reasonable conservative care.  There is no current active infection.  Patient Active Problem List   Diagnosis Date Noted  . Angina decubitus 11/08/2014  . Hip joint pain 11/08/2014  . Chronic diastolic heart failure 76/28/3151  . Acute on chronic diastolic heart failure 76/16/0737  . Protein-calorie malnutrition, severe 03/31/2014  . Hypocalcemia 03/30/2014  . Diarrhea 03/30/2014  . Bronchitis, acute 03/30/2014  . CAD s/p CABG 1994 09/25/2013  . Bilateral carotid artery occlusion 09/25/2013  . 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 H/O gastric bypass 09/25/2013  . History of Diastolic heart failure 10/62/6948  . Iron deficiency anemia 05/28/2012   Past Medical History  Diagnosis Date  . Ischemic heart disease   . Diastolic dysfunction   . Chronic anemia   . History of recurrent UTIs   .  Peroneal neuropathy   . Retinopathy   . Stroke   . Hypocalcemia   . Morbid obesity     s/p gastric bypass  . Depression   . CAD s/p CABG 1994 09/25/2013    LIMA-LAD, SVG Diag, SVG-RCA, [facility] Cath 2010: Stents in the proximal LAD with 40% in-stent restenosis and 50% post-stent stenosis, 80% small OM 1, 70% small OM 2, 40% first PLA; diffuse 70% distal RCA; patent SVG to diagonal, patent SVG to distal RCA, patent atretic LIMA EF 55%; normal nuclear stress test 2011  . Bilateral carotid artery occlusion 09/25/2013  . 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 Allergy   . Anxiety   . Asthma   . Cataract   . CHF (congestive heart failure)   . Diabetes mellitus without complication   . GERD (gastroesophageal reflux disease)   . Glaucoma   . Heart murmur   . Hypertension   . Hyperlipidemia   . Osteoporosis   . Arthritis     Past Surgical History  Procedure Laterality Date  . Ventral hernia repair    . Appendectomy    . Vaginal hysterectomy    . Coronary artery bypass graft  1994    LIM to LAD,SVG to first diagonal,SVG to distal RCA  . Cardiac catheterization  03/30/2009    3 vessel CAD,patent grafts  . Gastric bypass  2002  . Cholecystectomy    . Heel spur excision  2012  . Trigger finger release  2012  . Radiofrequency ablation      AVNRT - successful  . Nm myocar perf wall motion  2011  no ischemia  . US echocardiography  05/09/2010    mild MR,mild mitral and aortic sclerosis  . Small intestine surgery    . Hernia repair      Prescriptions prior to admission  Medication Sig Dispense Refill Last Dose  . acetaminophen (TYLENOL) 500 MG tablet Take 1,500 mg by mouth every 6 (six) hours as needed for mild pain.    12/27/2014 at Unknown time  . Calcium Citrate 1040 MG TABS Take 1 tablet (1,040 mg total) by mouth 2 (two) times daily with a meal. 60 tablet 11 12/27/2014 at Unknown time  . citalopram (CELEXA) 20 MG tablet Take 1 tablet (20 mg total) by mouth daily.  (Patient taking differently: Take 40 mg by mouth daily. ) 30 tablet 3 12/27/2014 at Unknown time  . Cyanocobalamin (VITAMIN B-12) 1000 MCG SUBL Place under the tongue as needed.   Past Month at Unknown time  . furosemide (LASIX) 80 MG tablet Take 1 tablet (80 mg total) by mouth daily. 30 tablet 5 12/26/2014 at Unknown time  . metolazone (ZAROXOLYN) 2.5 MG tablet Take one tablet daily  X 3 days then as needed if weight is greater than 155 lbs. 90 tablet 3 unknown  . morphine (MS CONTIN) 30 MG 12 hr tablet Take 30 mg by mouth every 8 (eight) hours as needed.    12/27/2014 at Unknown time  . NEXIUM 40 MG capsule Take 1 capsule by mouth daily.   12/27/2014 at Unknown time  . nitroGLYCERIN (NITROLINGUAL) 0.4 MG/SPRAY spray USE ONE TO TWO SPRAYS UNDER THE TONGUE EVERY 5 MINUTES AS NEEDED FOR CHEST PAIN 12 g 5 unknown  . potassium chloride (K-DUR) 10 MEQ tablet Take 1 tablet (10 mEq total) by mouth daily. 30 tablet 5 12/26/2014 at Unknown time  . Vitamin D, Ergocalciferol, (DRISDOL) 50000 UNITS CAPS capsule Take 1 capsule (50,000 Units total) by mouth every 7 (seven) days. 4 capsule 3 Past Week at Unknown time  . doxycycline (VIBRA-TABS) 100 MG tablet Take 1 tablet (100 mg total) by mouth 2 (two) times daily. (Patient not taking: Reported on 12/27/2014) 14 tablet 0 Not Taking at Unknown time  . meloxicam (MOBIC) 7.5 MG tablet Take 1 tablet (7.5 mg total) by mouth daily. (Patient not taking: Reported on 11/22/2014) 30 tablet 11 Not Taking   Allergies  Allergen Reactions  . Latex   . Metoclopramide Hcl   . Neurontin [Gabapentin]   . Penicillins     History  Substance Use Topics  . Smoking status: Former Smoker    Quit date: 12/18/1987  . Smokeless tobacco: Never Used  . Alcohol Use: No    Family History  Problem Relation Age of Onset  . Diabetes Brother   . Asthma Brother   . Depression Brother   . Asthma Brother   . Depression Brother   . Diabetes Brother   . Cancer Father     skin  . Alcohol  abuse Father   . Arthritis Father   . Depression Father   . Hypertension Father   . Diabetes Mother   . Heart failure Mother   . Arthritis Mother   . Heart disease Mother   . Asthma Sister   . Heart attack Sister   . Depression Sister   . Diabetes Sister   . Drug abuse Sister   . Heart disease Sister   . Mental illness Sister   . Heart attack Son   . Hypertension Son   . Kidney disease Son  transplant 2003  . Vision loss Son      ROS ROS: I have reviewed the patient's review of systems thoroughly and there are no positive responses as relates to the HPI.  Objective:  Physical Exam  Vital signs in last 24 hours: Temp:  [98.6 F (37 C)] 98.6 F (37 C) (01/11 1128) Pulse Rate:  [68] 68 (01/11 1128) Resp:  [20] 20 (01/11 1128) BP: (103)/(54) 103/54 mmHg (01/11 1128) SpO2:  [100 %] 100 % (01/11 1128) Weight:  [160 lb (72.576 kg)] 160 lb (72.576 kg) (01/11 1128) Well-developed well-nourished patient in no acute distress. Alert and oriented x3 HEENT:within normal limits Cardiac: Regular rate and rhythm Pulmonary: Lungs clear to auscultation Abdomen: Soft and nontender.  Normal active bowel sounds  Musculoskeletal: (Right hip: Severe pain with internal and external rotation.  Severe pain with flexion extension.  She is neurovascular intact distally. Labs: Recent Results (from the past 2160 hour(s))  COMPLETE METABOLIC PANEL WITH GFR     Status: Abnormal   Collection Time: 11/22/14 11:27 AM  Result Value Ref Range   Sodium 141 135 - 145 mEq/L   Potassium 3.7 3.5 - 5.3 mEq/L   Chloride 108 96 - 112 mEq/L   CO2 22 19 - 32 mEq/L   Glucose, Bld 123 (H) 70 - 99 mg/dL   BUN 12 6 - 23 mg/dL   Creat 0.58 0.50 - 1.10 mg/dL   Total Bilirubin 0.6 0.2 - 1.2 mg/dL   Alkaline Phosphatase 267 (H) 39 - 117 U/L   AST 24 0 - 37 U/L   ALT 16 0 - 35 U/L   Total Protein 6.4 6.0 - 8.3 g/dL   Albumin 3.7 3.5 - 5.2 g/dL   Calcium 6.4 (LL) 8.4 - 10.5 mg/dL    Comment: Result  repeated and verified.   GFR, Est African American >89 mL/min   GFR, Est Non African American >89 mL/min    Comment:   The estimated GFR is a calculation valid for adults (>=42 years old) that uses the CKD-EPI algorithm to adjust for age and sex. It is   not to be used for children, pregnant women, hospitalized patients,    patients on dialysis, or with rapidly changing kidney function. According to the NKDEP, eGFR >89 is normal, 60-89 shows mild impairment, 30-59 shows moderate impairment, 15-29 shows severe impairment and <15 is ESRD.     TSH     Status: None   Collection Time: 11/22/14 11:27 AM  Result Value Ref Range   TSH 1.126 0.350 - 4.500 uIU/mL  Vit D  25 hydroxy (rtn osteoporosis monitoring)     Status: Abnormal   Collection Time: 11/22/14 11:27 AM  Result Value Ref Range   Vit D, 25-Hydroxy <4 (L) 30 - 100 ng/mL    Comment: ** Please note change in reference range(s). ** Vitamin D Status           25-OH Vitamin D        Deficiency                <20 ng/mL        Insufficiency         20 - 29 ng/mL        Optimal             > or = 30 ng/mL   For 25-OH Vitamin D testing on patients on D2-supplementation and patients for whom quantitation of D2 and D3 fractions is required, the  [employer]ure[employer] 25-OH VIT D, (D2,D3), LC/MS/MS is recommended: order code 810 220 0707 (patients > 2 yrs).   Parathyroid hormone, intact (no Ca)     Status: Abnormal   Collection Time: 11/22/14 11:27 AM  Result Value Ref Range   PTH 416 (H) 14 - 64 pg/mL    Comment:   Interpretive Guide:                              Intact PTH               Calcium                              ----------               ------- Normal Parathyroid           Normal                   Normal Hypoparathyroidism           Low or Low Normal        Low Hyperparathyroidism      Primary                 Normal or High           High      Secondary               High                     Normal or Low      Tertiary                 High                     High Non-Parathyroid   Hypercalcemia              Low or Low Normal        High   **Please note change in methodology. If re-baselining is needed, for patients who are being serially tested, please call customer service, within 3 days of collection, to request to add on the appropriate re-baselining test code for the previous methodology, at no charge.**   CBC with Differential     Status: Abnormal   Collection Time: 11/22/14 11:27 AM  Result Value Ref Range   WBC 5.8 4.0 - 10.5 K/uL   RBC 3.72 (L) 3.87 - 5.11 MIL/uL   Hemoglobin 11.8 (L) 12.0 - 15.0 g/dL   HCT 34.8 (L) 36.0 - 46.0 %   MCV 93.5 78.0 - 100.0 fL   MCH 31.7 26.0 - 34.0 pg   MCHC 33.9 30.0 - 36.0 g/dL   RDW 14.3 11.5 - 15.5 %   Platelets 190 150 - 400 K/uL   MPV 10.5 9.4 - 12.4 fL   Neutrophils Relative % 62 43 - 77 %   Neutro Abs 3.6 1.7 - 7.7 K/uL   Lymphocytes Relative 32 12 - 46 %   Lymphs Abs 1.9 0.7 - 4.0 K/uL   Monocytes Relative 5 3 - 12 %   Monocytes Absolute 0.3 0.1 - 1.0 K/uL   Eosinophils Relative 1 0 - 5 %   Eosinophils Absolute 0.1 0.0 - 0.7 K/uL   Basophils Relative 0 0 - 1 %   Basophils Absolute 0.0 0.0 - 0.1 K/uL  Smear Review Criteria for review not met   Phosphorus     Status: None   Collection Time: 11/22/14 11:27 AM  Result Value Ref Range   Phosphorus 3.4 2.3 - 4.6 mg/dL  Magnesium     Status: None   Collection Time: 11/22/14 11:27 AM  Result Value Ref Range   Magnesium 1.9 1.5 - 2.5 mg/dL  Surgical pcr screen     Status: None   Collection Time: 12/16/14 11:04 AM  Result Value Ref Range   MRSA, PCR NEGATIVE NEGATIVE   Staphylococcus aureus NEGATIVE NEGATIVE    Comment:        The Xpert SA Assay (FDA approved for NASAL specimens in patients over 4 years of age), is one component of a comprehensive surveillance program.  Test performance has been validated by [employer] for patients greater than or equal to 86 year old. It is not intended to diagnose  infection nor to guide or monitor treatment.   Basic metabolic panel     Status: Abnormal   Collection Time: 12/16/14 11:04 AM  Result Value Ref Range   Sodium 139 135 - 145 mmol/L    Comment: Please note change in reference range.   Potassium 4.4 3.5 - 5.1 mmol/L    Comment: Please note change in reference range.   Chloride 112 96 - 112 mEq/L   CO2 21 19 - 32 mmol/L   Glucose, Bld 107 (H) 70 - 99 mg/dL   BUN 9 6 - 23 mg/dL   Creatinine, Ser 0.53 0.50 - 1.10 mg/dL   Calcium 7.2 (L) 8.4 - 10.5 mg/dL   GFR calc non Af Amer >90 >90 mL/min   GFR calc Af Amer >90 >90 mL/min    Comment: (NOTE) The eGFR has been calculated using the CKD EPI equation. This calculation has not been validated in all clinical situations. eGFR's persistently <90 mL/min signify possible Chronic Kidney Disease.    Anion gap 6 5 - 15  CBC     Status: Abnormal   Collection Time: 12/16/14 11:04 AM  Result Value Ref Range   WBC 4.2 4.0 - 10.5 K/uL   RBC 3.62 (L) 3.87 - 5.11 MIL/uL   Hemoglobin 11.2 (L) 12.0 - 15.0 g/dL   HCT 34.9 (L) 36.0 - 46.0 %   MCV 96.4 78.0 - 100.0 fL   MCH 30.9 26.0 - 34.0 pg   MCHC 32.1 30.0 - 36.0 g/dL   RDW 14.8 11.5 - 15.5 %   Platelets 129 (L) 150 - 400 K/uL  Protime-INR     Status: Abnormal   Collection Time: 12/16/14 11:04 AM  Result Value Ref Range   Prothrombin Time 16.8 (H) 11.6 - 15.2 seconds   INR 1.34 0.00 - 1.49  PTT     Status: None   Collection Time: 12/16/14 11:04 AM  Result Value Ref Range   aPTT 32 24 - 37 seconds  Type and screen     Status: None (Preliminary result)   Collection Time: 12/16/14 11:06 AM  Result Value Ref Range   ABO/RH(D) O POS    Antibody Screen NEG    Sample Expiration 12/30/2014    Unit Number [id number]    Blood Component Type RED CELLS,LR    Unit division 00    Status of Unit ALLOCATED    Donor AG Type      NEGATIVE FOR KELL ANTIGEN NEGATIVE FOR C ANTIGEN NEGATIVE FOR DUFFY A ANTIGEN   Transfusion Status OK TO TRANSFUSE  Crossmatch Result COMPATIBLE    Unit Number [id number]    Blood Component Type RED CELLS,LR    Unit division 00    Status of Unit ALLOCATED    Donor AG Type      NEGATIVE FOR KELL ANTIGEN NEGATIVE FOR C ANTIGEN NEGATIVE FOR DUFFY A ANTIGEN   Transfusion Status OK TO TRANSFUSE    Crossmatch Result COMPATIBLE   Glucose, capillary     Status: None   Collection Time: 12/27/14 11:21 AM  Result Value Ref Range   Glucose-Capillary 82 70 - 99 mg/dL     Estimated body mass index is 27.45 kg/(m^2) as calculated from the following:   Height as of this encounter: $RemoveBeforeD'5\' 4"'[id number]$  (1.626 m).   Weight as of this encounter: 160 lb (72.576 kg).   Imaging Review Plain radiographs demonstrate severe degenerative joint disease of the right hip(s). The bone quality appears to be good for age and reported activity level.  Assessment/Plan:  End stage arthritis, right hip(s)  The patient history, physical examination, clinical judgement of the provider and imaging studies are consistent with end stage degenerative joint disease of the right hip(s) and total hip arthroplasty is deemed medically necessary. The treatment options including medical management, injection therapy, arthroscopy and arthroplasty were discussed at length. The risks and benefits of total hip arthroplasty were presented and reviewed. The risks due to aseptic loosening, infection, stiffness, dislocation/subluxation,  thromboembolic complications and other imponderables were discussed.  The patient acknowledged the explanation, agreed to proceed with the plan and consent was signed. Patient is being admitted for inpatient treatment for surgery, pain control, PT, OT, prophylactic antibiotics, VTE prophylaxis, progressive ambulation and ADL's and discharge planning.The patient is planning to be discharged home with home health services

## 2014-12-27 NOTE — Brief Op Note (Signed)
12/27/2014  3:07 PM  PATIENT:  [name]  64 y.o. female  PRE-OPERATIVE DIAGNOSIS:  DEGENERATIVE JOINT DISEASE  POST-OPERATIVE DIAGNOSIS:  DEGENERATIVE JOINT DISEASE  PROCEDURE:  Procedure(s): TOTAL HIP ARTHROPLASTY ANTERIOR APPROACH (Right)  SURGEON:  Surgeon(s) and Role:    * Alta Corning, MD - Primary  PHYSICIAN ASSISTANT:   ASSISTANTS: bethune   ANESTHESIA:   general  EBL:  Total I/O In: 2250 [I.V.:2000; IV Piggyback:250] Out: 650 [Blood:650]  BLOOD ADMINISTERED:none  DRAINS: none   LOCAL MEDICATIONS USED:  MARCAINE     SPECIMEN:  No Specimen  DISPOSITION OF SPECIMEN:  N/A  COUNTS:  YES  TOURNIQUET:  * No tourniquets in log *  DICTATION: .Other Dictation: Dictation Number [id number]  PLAN OF CARE: Admit to inpatient   PATIENT DISPOSITION:  PACU - hemodynamically stable.   Delay start of Pharmacological VTE agent (>24hrs) due to surgical blood loss or risk of bleeding: no

## 2014-12-28 ENCOUNTER — Encounter (HOSPITAL_COMMUNITY): Payer: Self-pay | Admitting: Orthopedic Surgery

## 2014-12-28 DIAGNOSIS — M1611 Unilateral primary osteoarthritis, right hip: Principal | ICD-10-CM

## 2014-12-28 LAB — BASIC METABOLIC PANEL
ANION GAP: 4 — AB (ref 5–15)
BUN: 12 mg/dL (ref 6–23)
CHLORIDE: 106 meq/L (ref 96–112)
CO2: 23 mmol/L (ref 19–32)
CREATININE: 0.66 mg/dL (ref 0.50–1.10)
Calcium: 5.4 mg/dL — CL (ref 8.4–10.5)
GFR calc non Af Amer: >90 mL/min (ref >90)
Glucose, Bld: 155 mg/dL — ABNORMAL HIGH (ref 70–99)
Potassium: 3.5 mmol/L (ref 3.5–5.1)
Sodium: 133 mmol/L — ABNORMAL LOW (ref 135–145)

## 2014-12-28 LAB — CBC
HCT: 24.7 % — ABNORMAL LOW (ref 36.0–46.0)
Hemoglobin: 8.1 g/dL — ABNORMAL LOW (ref 12.0–15.0)
MCH: 32.7 pg (ref 26.0–34.0)
MCHC: 32.8 g/dL (ref 30.0–36.0)
MCV: 99.6 fL (ref 78.0–100.0)
PLATELETS: 127 10*3/uL — AB (ref 150–400)
RBC: 2.48 MIL/uL — ABNORMAL LOW (ref 3.87–5.11)
RDW: 15.3 % (ref 11.5–15.5)
WBC: 5.5 10*3/uL (ref 4.0–10.5)

## 2014-12-28 LAB — HEPATIC FUNCTION PANEL
ALT: 26 U/L (ref 0–35)
AST: 28 U/L (ref 0–37)
Albumin: 2.6 g/dL — ABNORMAL LOW (ref 3.5–5.2)
Alkaline Phosphatase: 159 U/L — ABNORMAL HIGH (ref 39–117)
BILIRUBIN TOTAL: 0.4 mg/dL (ref 0.3–1.2)
Bilirubin, Direct: 0.1 mg/dL (ref 0.0–0.3)
Indirect Bilirubin: 0.3 mg/dL (ref 0.3–0.9)
Total Protein: 4.7 g/dL — ABNORMAL LOW (ref 6.0–8.3)

## 2014-12-28 LAB — GLUCOSE, CAPILLARY
Glucose-Capillary: 121 mg/dL — ABNORMAL HIGH (ref 70–99)
Glucose-Capillary: 165 mg/dL — ABNORMAL HIGH (ref 70–99)
Glucose-Capillary: 252 mg/dL — ABNORMAL HIGH (ref 70–99)
Glucose-Capillary: 149 mg/dL — ABNORMAL HIGH (ref 70–99)

## 2014-12-28 LAB — MAGNESIUM: Magnesium: 1.5 mg/dL (ref 1.5–2.5)

## 2014-12-28 LAB — PREALBUMIN: PREALBUMIN: 10.1 mg/dL — AB (ref 17.0–34.0)

## 2014-12-28 LAB — PREPARE RBC (CROSSMATCH)

## 2014-12-28 MED ORDER — VITAMIN D (ERGOCALCIFEROL) 1.25 MG (50000 UNIT) PO CAPS: 50000.0000 [IU] | ORAL_CAPSULE | ORAL | Status: DC

## 2014-12-28 MED ORDER — POTASSIUM CHLORIDE CRYS ER 20 MEQ PO TBCR
40.0000 meq | EXTENDED_RELEASE_TABLET | Freq: Once | ORAL | Status: AC
  Administered 2014-12-28: 40 meq via ORAL

## 2014-12-28 MED ORDER — ACETAMINOPHEN 325 MG PO TABS
650.0000 mg | ORAL_TABLET | Freq: Once | ORAL | Status: AC
  Administered 2014-12-28: 650 mg via ORAL
  Filled 2014-12-28: qty 2

## 2014-12-28 MED ORDER — FUROSEMIDE 10 MG/ML IJ SOLN
20.0000 mg | Freq: Once | INTRAMUSCULAR | Status: AC
  Administered 2014-12-28: 20 mg via INTRAVENOUS
  Filled 2014-12-28: qty 2

## 2014-12-28 MED ORDER — VITAMIN D (ERGOCALCIFEROL) 1.25 MG (50000 UNIT) PO CAPS
50000.0000 [IU] | ORAL_CAPSULE | Freq: Every day | ORAL | Status: DC
  Administered 2014-12-28 – 2014-12-31 (×4): 50000 [IU] via ORAL
  Filled 2014-12-28 (×5): qty 1

## 2014-12-28 MED ORDER — SODIUM CHLORIDE 0.9 % IV SOLN
1.0000 g | Freq: Once | INTRAVENOUS | Status: AC
  Administered 2014-12-28: 1 g via INTRAVENOUS
  Filled 2014-12-28: qty 10

## 2014-12-28 MED ORDER — SODIUM CHLORIDE 0.9 % IV SOLN
1.0000 g | INTRAVENOUS | Status: AC
  Administered 2014-12-28: 1 g via INTRAVENOUS
  Filled 2014-12-28 (×2): qty 10

## 2014-12-28 MED ORDER — DIPHENHYDRAMINE HCL 25 MG PO CAPS
25.0000 mg | ORAL_CAPSULE | Freq: Once | ORAL | Status: AC
  Administered 2014-12-28: 25 mg via ORAL
  Filled 2014-12-28: qty 1

## 2014-12-28 MED ORDER — MORPHINE SULFATE ER 15 MG PO TBCR
30.0000 mg | EXTENDED_RELEASE_TABLET | Freq: Three times a day (TID) | ORAL | Status: DC
  Administered 2014-12-28 – 2014-12-31 (×10): 30 mg via ORAL
  Filled 2014-12-28 (×10): qty 2

## 2014-12-28 MED ORDER — SODIUM CHLORIDE 0.9 % IV SOLN
1.0000 g | Freq: Once | INTRAVENOUS | Status: AC
  Administered 2014-12-28: 1 g via INTRAVENOUS
  Filled 2014-12-28 (×2): qty 10

## 2014-12-28 MED ORDER — MAGNESIUM SULFATE 2 GM/50ML IV SOLN
2.0000 g | Freq: Once | INTRAVENOUS | Status: AC
  Administered 2014-12-28: 2 g via INTRAVENOUS
  Filled 2014-12-28 (×2): qty 50

## 2014-12-28 MED ORDER — SODIUM CHLORIDE 0.9 % IV SOLN
Freq: Once | INTRAVENOUS | Status: AC
  Administered 2014-12-28: 16:00:00 via INTRAVENOUS

## 2014-12-28 NOTE — Progress Notes (Signed)
OT Cancellation Note  Patient Details Name: [name] MRN: [id number] DOB: [birth year]   Cancelled Treatment:    Reason Eval/Treat Not Completed: Pain limiting ability to participate. Pt declined due to 9/10 pain. OT to reattempt tomorrow.   Hortencia Pilar 12/28/2014, 2:17 PM

## 2014-12-28 NOTE — Progress Notes (Addendum)
Pt ran 12 beat run of SVT HR 140 and went back to baseline. Pt was not symptomatic and was sleeping when I came into the room. Notified MD because this is the fist time pt has done this. MD aware and will continue to monitor

## 2014-12-28 NOTE — Op Note (Signed)
NAMEJANNETH, KRASNER NO.:  [id number]  MEDICAL RECORD NO.:  [id number]  LOCATION:  [id number]                        FACILITY:  [facility]  PHYSICIAN:  Alta Corning, M.D.   DATE OF BIRTH:  December 04, 1951  DATE OF PROCEDURE:  12/27/2014 DATE OF DISCHARGE:                              OPERATIVE REPORT   POSTOPERATIVE DIAGNOSIS:  End-stage degenerative joint disease, right hip.  POSTOPERATIVE DIAGNOSIS:  End-stage degenerative joint disease, right hip.  PROCEDURE:  Right total hip replacement with a Corail size 18 stem, 50- mm Gription cup, 32 mm +0 hip ball, and a +4 neutral liner.  SURGEON:  Alta Corning, M.D.  ASSISTANT:  Gary Fleet, PA  ANESTHESIA:  General.  BRIEF HISTORY:  Ms. Hansman is a 64 year old female history of significant complaints of right hip pain.  She had a preoperatively significant pain with internal-external rotation.  X-ray showed narrowing in the hip joint and after failure of all conservative care, she was taken to the operating room for right total hip replacement. Because of her body habitus and age, we felt that an anterior approach was appropriate and she was chosen for this preoperative procedure.  PROCEDURE IN DETAIL:  The patient was taken to the operating room. After adequate anesthesia was obtained with general anesthetic, the patient was placed supine on the operating table.  The right leg was prepped and draped in sterile usual fashion after she was placed on the Hana bed.  Once this was done, preoperative images were taken with x-ray showing symmetric leg lengths and we got good rotational images.  At this point, the hip was prepped and draped in usual sterile fashion. Following this, an incision was made from just inferior to the superior iliac spine spiraling down the femur and subcutaneous tissue down the level of the tensor fascia which was identified and divided and then the muscle was retracted back to the level of the  hip joint.  Retractors were put in place.  The vessels coming into the anterior aspect of the hip were cauterized and hip capsule was opened and the provisional neck cut was made after the hip was dislocated and reduced.  Once the provisional neck cut was made, the head was removed and the acetabulum was then identified. The labrectomy was performed, acetabulum was sequentially reamed to a level of 49, and a size 50 cup was used in 45 degrees of lateral opening and 20 degrees of anteversion.  Once this was done, the cup was hammered into place.  Hole eliminator was placed and final neutral liner.  Attention turned to the stem side where the stem was sequentially broached up to a level of 18 to a size 18 stem. The size 18 stem gave [place] really nice fit and a trial reduction was undertaken which showed this to be a touch long with calcar plane, sunk the 18 little lower, opened the final 18 and placed it, put in a +0 ball and reduced the hip.  Final images were taken as well as pelvis which showing reasonable symmetric leg lengths.  At this point, 100 mL of saline with 2 g of tranexamic acid was instilled into the wound and  allowed to sit there for 3 minutes.  The anterior hip capsule was closed with 0 Vicryl interrupted.  A 60 mL of Exparel was then instilled in and around the hip joint for postoperative anesthesia.  The tensor fascia was closed 1 Vicryl running, the fat was packed down and then the skin closed with Vicryl and Monocryl suture.  Sterile compressive dressing was applied, and the patient was taken to recovery and as noted to be in satisfactory condition.  Estimated blood for the procedure was 650 mL.     Alta Corning, M.D.     Corliss Skains  D:  12/27/2014  T:  12/28/2014  Job:  [id number]

## 2014-12-28 NOTE — Progress Notes (Signed)
Utilization review completed.  

## 2014-12-28 NOTE — Evaluation (Signed)
Physical Therapy Evaluation Patient Details Name: [name] MRN: [id number] DOB: 09-13-1951 Today's Date: 12/28/2014   History of Present Illness  Pt is a 64 y/o female admitted s/p direct anterior total hip replacement. Pt is WBAT.   Clinical Impression  Pt admitted with above diagnosis. Pt currently with functional limitations due to the deficits listed below (see PT Problem List). At the time of PT eval pt required mod to max assist for basic transfers. Unable to ambulate at this time; noted buckling of knees during sit to stand at [facility] in which pt required max assist to control descent back to a sitting position. Pt will benefit from skilled PT to increase their independence and safety with mobility to allow discharge to the venue listed below.       Follow Up Recommendations SNF;Supervision/Assistance - 24 hour    Equipment Recommendations  Rolling walker with 5" wheels;3in1 (PT)    Recommendations for Other Services       Precautions / Restrictions Precautions Precautions: Fall Restrictions Weight Bearing Restrictions: Yes RLE Weight Bearing: Weight bearing as tolerated      Mobility  Bed Mobility Overal bed mobility: Needs Assistance Bed Mobility: Supine to Sit     Supine to sit: Mod assist     General bed mobility comments: Assist for movement of bilateral LE's, as well as for trunk support. Hand-over-hand assist to reach and hold bed rails.   Transfers Overall transfer level: Needs assistance Equipment used: Rolling walker (2 wheeled) Transfers: Sit to/from [facility] Sit to Stand: Max assist Stand pivot transfers: Mod assist       General transfer comment: Pt able to stand x3 - once from bed, twice from [facility]. Max assist provided all attempts to achieve full standing position. Pt required assist for walker placement/movement, as well as for pt support to take pivotal steps around to the bedside commode. As pt was very fatigued at end of  using bedside commode, BSC pulled out and recliner replaced as pt was statically standing.   Ambulation/Gait             General Gait Details: Unable at this time.   Stairs            Wheelchair Mobility    Modified Rankin (Stroke Patients Only)       Balance Overall balance assessment: Needs assistance Sitting-balance support: Feet supported;No upper extremity supported Sitting balance-Leahy Scale: Fair     Standing balance support: Bilateral upper extremity supported;During functional activity Standing balance-Leahy Scale: Poor                               Pertinent Vitals/Pain Pain Assessment: Faces Faces Pain Scale: Hurts whole lot Pain Location: R hip Pain Intervention(s): Limited activity within patient's tolerance;Monitored during session;Repositioned;Patient requesting pain meds-RN notified    Home Living Family/patient expects to be discharged to:: Private residence Living Arrangements: Alone Available Help at Discharge: Family;Available PRN/intermittently Type of Home: House Home Access: Stairs to enter Entrance Stairs-Rails: None Entrance Stairs-Number of Steps: 5 Home Layout: One level Home Equipment: Cane - single point;Walker - 4 wheels      Prior Function Level of Independence: Independent with assistive device(s)               Hand Dominance   Dominant Hand: Right    Extremity/Trunk Assessment   Upper Extremity Assessment: Defer to OT evaluation  Lower Extremity Assessment: RLE deficits/detail RLE Deficits / Details: Decreased strength and AROM consistent with THA    Cervical / Trunk Assessment: Normal  Communication   Communication: No difficulties  Cognition Arousal/Alertness: Awake/alert Behavior During Therapy: WFL for tasks assessed/performed Overall Cognitive Status: Within Functional Limits for tasks assessed                      General Comments      Exercises General  Exercises - Lower Extremity Ankle Circles/Pumps: 10 reps      Assessment/Plan    PT Assessment Patient needs continued PT services  PT Diagnosis Acute pain;Difficulty walking   PT Problem List Decreased strength;Decreased range of motion;Decreased activity tolerance;Decreased balance;Decreased mobility;Decreased knowledge of use of DME;Decreased safety awareness;Decreased knowledge of precautions;Pain  PT Treatment Interventions DME instruction;Gait training;Stair training;Functional mobility training;Therapeutic activities;Therapeutic exercise;Neuromuscular re-education;Patient/family education   PT Goals (Current goals can be found in the Care Plan section) Acute Rehab PT Goals Patient Stated Goal: Return home PT Goal Formulation: With patient Time For Goal Achievement: 01/11/15 Potential to Achieve Goals: Good    Frequency 7X/week   Barriers to discharge Decreased caregiver support Pt lives alone with multiple animals to care for    Co-evaluation               End of Session Equipment Utilized During Treatment: Gait belt Activity Tolerance: Patient limited by pain;Patient limited by fatigue Patient left: in chair;with call bell/phone within reach;with family/visitor present Nurse Communication: Mobility status;Patient requests pain meds         Time: 1017-1057 PT Time Calculation (min) (ACUTE ONLY): 40 min   Charges:   PT Evaluation $Initial PT Evaluation Tier I: 1 Procedure PT Treatments $Gait Training: 8-22 mins $Therapeutic Activity: 8-22 mins   PT G Codes:        Rolinda Roan Jan 16, 2015, 11:14 AM   Rolinda Roan, PT, DPT Acute Rehabilitation Services Pager: [phone]

## 2014-12-28 NOTE — Progress Notes (Signed)
Paged Dr. Carlean Purl about pt tempeture being 100.5 prior to administering the first unit of blood. Awaiting  call back from MD.

## 2014-12-28 NOTE — Progress Notes (Signed)
CRITICAL VALUE ALERT  Critical value received:  Ca 5.4  Date of notification:  12/28/14  Time of notification:  7471  Critical value read back:Yes.    Nurse who received alert:  Renato Shin   MD notified (1st page):  Graves  Time of first page:  0755   MD notified (2nd page):  Time of second page:  Responding MD:  Berenice Primas  Time MD responded:  8550

## 2014-12-28 NOTE — Progress Notes (Signed)
MD stated to go ahead and administer blood.

## 2014-12-28 NOTE — Progress Notes (Signed)
Subjective: 1 Day Post-Op Procedure(s) (LRB): TOTAL HIP ARTHROPLASTY ANTERIOR APPROACH (Right) Patient reports pain as severe.    Objective: Vital signs in last 24 hours: Temp:  [97.6 F (36.4 C)-99 F (37.2 C)] 99 F (37.2 C) (01/12 0537) Pulse Rate:  [68-98] 96 (01/12 0537) Resp:  [9-20] 16 (01/12 0537) BP: (103-169)/(45-94) 125/45 mmHg (01/12 0537) SpO2:  [92 %-100 %] 100 % (01/12 0537) Weight:  [160 lb (72.576 kg)] 160 lb (72.576 kg) (01/11 1128)  Intake/Output from previous day: 01/11 0701 - 01/12 0700 In: 2750 [I.V.:2500; IV Piggyback:250] Out: 950 [Urine:300; Blood:650] Intake/Output this shift:     Recent Labs  12/28/14 0607  HGB 8.1*    Recent Labs  12/28/14 0607  WBC 5.5  RBC 2.48*  HCT 24.7*  PLT 127*    Recent Labs  12/28/14 0607  NA 133*  K 3.5  CL 106  CO2 23  BUN 12  CREATININE 0.66  GLUCOSE 155*  CALCIUM 5.4*   No results for input(s): LABPT, INR in the last 72 hours.  Neurologically intact ABD soft Neurovascular intact Sensation intact distally Intact pulses distally No cellulitis present Compartment soft  Assessment/Plan: 1 Day Post-Op Procedure(s) (LRB): TOTAL HIP ARTHROPLASTY ANTERIOR APPROACH (Right)  Post op hypo calcemia and acute blood loss anemia Pain problems--will restart home meds not PRN but scheduled Advance diet Up with therapy Plan for discharge tomorrow  Transfuse 2 u prbc"s  Replete Ca++ today  --will get int med consult for adress Ca++[name] L 12/28/2014, 8:31 AM

## 2014-12-28 NOTE — Consult Note (Signed)
[facility] Medical Consultation  [name] GUR:[id number] DOB: 1951/08/29 DOA: 12/27/2014 PCP: Karis Juba, PA-C   Requesting physician: Dr Berenice Primas Date of consultation: 12/28/2014 Reason for consultation: Hypocalcemia  Impression/Recommendations Principal Problem:   Primary osteoarthritis of right hip    1. Hypocalcemia. Patient is a pleasant 64 year old female with a history of hypocalcemia and vitamin D deficiency. She appears frail and vulnerable as I believe that she is likely malnourished. On 11/22/2014 labs revealed a profoundly low vitamin D level less than 4 which is contributing to hypocalcemia. On physical exam she had a positive Chvostek's sign. Will check an albumin level along with magnesium, pre-albumin, and vitamin D level. Will also check an EKG and place on continuous cardiac monitoring. I recommend administering a total of 3 amp of calcium gluconate IV. Also would start 50,000 units of ergocalciferol daily for 7 days, after which she could take ergocalciferol 50,000 units every weekly. Would recommend holding discharged today given that she is symptomatic, recheck labs in a.m.  I will followup again tomorrow. Please contact me if I can be of assistance in the meanwhile. Thank you for this consultation.  Chief Complaint: Hypocalcemia  HPI: Patient is a pleasant 64 year old female with a past medical history of hypocalcemia, osteoporosis, also arthritis who was admitted to the orthopedic service on 12/27/2014 presenting with complaints of chronic right hip pain. Despite conservative management for chronic right hip pain in setting of osteoarthritis is continue to have pain symptoms and functional disability. She was taken to the OR on 12/28/2014 undergoing a total right hip replacement, patient tolerated procedure well and no immediate, occasions. Hospitalization was Acute by the development of acute blood loss anemia as her hemoglobin trended down to 8.1.  Orthopedic surgery transfusing 2 units packed red blood cells. Medicine consulted for management of hypocalcemia. Lab work showing a calcium of 5.4, decreased from 7.2 on 12/16/2014. On 11/22/2014 it appears she had a vitamin D level of less than 4. Labs on 11/22/2014 showed an albumin of 3.7. Patient complains of muscle cramping and spasms, becoming progressively worse over the past 24 hours. She states that spasms have affected her ability to even feed herself and has required nursing staff or with this. She reports that she has been on vitamin D therapy taking 50,000 units every weekly.   Review of Systems:  Constitutional:  No weight loss, night sweats, Fevers, chills, fatigue.  HEENT:  No headaches, Difficulty swallowing,Tooth/dental problems,Sore throat,  No sneezing, itching, ear ache, nasal congestion, post nasal drip,  Cardio-vascular:  No chest pain, Orthopnea, PND, swelling in lower extremities, anasarca, dizziness, palpitations  GI:  No heartburn, indigestion, abdominal pain, nausea, vomiting, diarrhea, change in bowel habits, loss of appetite  Resp:  No shortness of breath with exertion or at rest. No excess mucus, no productive cough, No non-productive cough, No coughing up of blood.No change in color of mucus.No wheezing.No chest wall deformity  Skin:  no rash or lesions.  GU:  no dysuria, change in color of urine, no urgency or frequency. No flank pain.  Musculoskeletal:  Positive for muscle twitching and cramps Positive for back pain and hip pain Psych:  No change in mood or affect. No depression or anxiety. No memory loss.  Past Medical History  Diagnosis Date  . Ischemic heart disease   . Diastolic dysfunction   . Chronic anemia   . History of recurrent UTIs   . Peroneal neuropathy   . Retinopathy   . Stroke   .  Hypocalcemia   . Morbid obesity     s/p gastric bypass  . Depression   . CAD s/p CABG 1994 09/25/2013    LIMA-LAD, SVG Diag, SVG-RCA, [place] Cath  2010: Stents in the proximal LAD with 40% in-stent restenosis and 50% post-stent stenosis, 80% small OM 1, 70% small OM 2, 40% first PLA; diffuse 70% distal RCA; patent SVG to diagonal, patent SVG to distal RCA, patent atretic LIMA EF 55%; normal nuclear stress test 2011  . Bilateral carotid artery occlusion 09/25/2013  . 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 Allergy   . Anxiety   . Asthma   . Cataract   . CHF (congestive heart failure)   . Diabetes mellitus without complication   . GERD (gastroesophageal reflux disease)   . Glaucoma   . Heart murmur   . Hypertension   . Hyperlipidemia   . Osteoporosis   . Arthritis    Past Surgical History  Procedure Laterality Date  . Ventral hernia repair    . Appendectomy    . Vaginal hysterectomy    . Coronary artery bypass graft  1994    LIM to LAD,SVG to first diagonal,SVG to distal RCA  . Cardiac catheterization  03/30/2009    3 vessel CAD,patent grafts  . Gastric bypass  2002  . Cholecystectomy    . Heel spur excision  2012  . Trigger finger release  2012  . Radiofrequency ablation      AVNRT - successful  . Nm myocar perf wall motion  2011    no ischemia  . US echocardiography  05/09/2010    mild MR,mild mitral and aortic sclerosis  . Small intestine surgery    . Hernia repair     Social History:  reports that she quit smoking about 27 years ago. She has never used smokeless tobacco. She reports that she does not drink alcohol or use illicit drugs.  Allergies  Allergen Reactions  . Latex   . Metoclopramide Hcl   . Neurontin [Gabapentin]   . Penicillins    Family History  Problem Relation Age of Onset  . Diabetes Brother   . Asthma Brother   . Depression Brother   . Asthma Brother   . Depression Brother   . Diabetes Brother   . Cancer Father     skin  . Alcohol abuse Father   . Arthritis Father   . Depression Father   . Hypertension Father   . Diabetes Mother   . Heart failure Mother   . Arthritis Mother    . Heart disease Mother   . Asthma Sister   . Heart attack Sister   . Depression Sister   . Diabetes Sister   . Drug abuse Sister   . Heart disease Sister   . Mental illness Sister   . Heart attack Son   . Hypertension Son   . Kidney disease Son     transplant 2003  . Vision loss Son     Prior to Admission medications   Medication Sig Start Date End Date Taking? Authorizing Provider  acetaminophen (TYLENOL) 500 MG tablet Take 1,500 mg by mouth every 6 (six) hours as needed for mild pain.    Yes Historical Provider, MD  Calcium Citrate 1040 MG TABS Take 1 tablet (1,040 mg total) by mouth 2 (two) times daily with a meal. 12/13/14  Yes Orlena Sheldon, PA-C  citalopram (CELEXA) 20 MG tablet Take 1 tablet (20 mg total) by mouth  daily. Patient taking differently: Take 40 mg by mouth daily.  12/13/14  Yes Mary B Dixon, PA-C  Cyanocobalamin (VITAMIN B-12) 1000 MCG SUBL Place under the tongue as needed.   Yes Historical Provider, MD  furosemide (LASIX) 80 MG tablet Take 1 tablet (80 mg total) by mouth daily. 10/06/14  Yes Mihai Croitoru, MD  metolazone (ZAROXOLYN) 2.5 MG tablet Take one tablet daily  X 3 days then as needed if weight is greater than 155 lbs. 10/06/14  Yes Mihai Croitoru, MD  morphine (MS CONTIN) 30 MG 12 hr tablet Take 30 mg by mouth every 8 (eight) hours as needed.    Yes Historical Provider, MD  NEXIUM 40 MG capsule Take 1 capsule by mouth daily. 08/19/14  Yes Historical Provider, MD  nitroGLYCERIN (NITROLINGUAL) 0.4 MG/SPRAY spray USE ONE TO TWO SPRAYS UNDER THE TONGUE EVERY 5 MINUTES AS NEEDED FOR CHEST PAIN 09/06/14  Yes Mihai Croitoru, MD  potassium chloride (K-DUR) 10 MEQ tablet Take 1 tablet (10 mEq total) by mouth daily. 09/22/14  Yes Mihai Croitoru, MD  Vitamin D, Ergocalciferol, (DRISDOL) 50000 UNITS CAPS capsule Take 1 capsule (50,000 Units total) by mouth every 7 (seven) days. 11/25/14  Yes Orlena Sheldon, PA-C  doxycycline (VIBRA-TABS) 100 MG tablet Take 1 tablet (100 mg  total) by mouth 2 (two) times daily. Patient not taking: Reported on 12/27/2014 11/26/14   Susy Frizzle, MD  meloxicam (MOBIC) 7.5 MG tablet Take 1 tablet (7.5 mg total) by mouth daily. Patient not taking: Reported on 11/22/2014 10/20/14   Orlena Sheldon, PA-C  methocarbamol (ROBAXIN) 500 MG tablet Take 1 tablet (500 mg total) by mouth every 8 (eight) hours as needed for muscle spasms. 12/27/14   Erlene Senters, PA-C   Physical Exam: Blood pressure 125/45, pulse 96, temperature 99 F (37.2 C), temperature source Oral, resp. rate 16, height 5\' 4"  (1.626 m), weight 72.576 kg (160 lb), SpO2 100 %. Filed Vitals:   12/28/14 0537  BP: 125/45  Pulse: 96  Temp: 99 F (37.2 C)  Resp: 16     General:  Patient is awake, alert, following commands. Appears chronically ill  Eyes: Pupils equal round reactive to light extraocular movement intact  Neck: Symmetrical, no thyromegaly, supple  Cardiovascular: Regular rate and rhythm normal S1-S2  Respiratory: Normal respiratory effort, lungs are clear  Abdomen: Soft nontender nondistended positive bowel sounds  Skin: No rashes or lesions  Musculoskeletal: Patient appears to have generalized weakness, positive Chvostek's sign  Psychiatric: Awake and alert, appears to be somewhat sleepy  Neurologic: Nonfocal  Labs on Admission:  Basic Metabolic Panel:  Recent Labs Lab 12/28/14 0607  NA 133*  K 3.5  CL 106  CO2 23  GLUCOSE 155*  BUN 12  CREATININE 0.66  CALCIUM 5.4*   Liver Function Tests: No results for input(s): AST, ALT, ALKPHOS, BILITOT, PROT, ALBUMIN in the last 168 hours. No results for input(s): LIPASE, AMYLASE in the last 168 hours. No results for input(s): AMMONIA in the last 168 hours. CBC:  Recent Labs Lab 12/28/14 0607  WBC 5.5  HGB 8.1*  HCT 24.7*  MCV 99.6  PLT 127*   Cardiac Enzymes: No results for input(s): CKTOTAL, CKMB, CKMBINDEX, TROPONINI in the last 168 hours. BNP: Invalid input(s):  POCBNP CBG:  Recent Labs Lab 12/27/14 1121 12/27/14 1854 12/27/14 2137 12/28/14 0643  GLUCAP 82 147* 166* 121*    Radiological Exams on Admission: Dg Hip Operative Unilat With Pelvis Right  12/27/2014  CLINICAL DATA:  Right total hip arthroplasty  EXAM: OPERATIVE RIGHT HIP WITH PELVIS  COMPARISON:  Right hip radiographs 10/19/2014  FINDINGS: Fluoroscopic spot images demonstrate placement of a right total hip arthroplasty. The components are well seated. No complicating features.  IMPRESSION: Well seated components of a total right hip arthroplasty. No complicating features.   Electronically Signed   By: Kalman Jewels M.D.   On: 12/27/2014 15:14    EKG: Independently reviewed.   Time spent: 88min  Eveny Anastas [facility] Pager [phone]  If 7PM-7AM, please contact night-coverage [email or web address] Password [email or web address] 12/28/2014, 8:59 AM

## 2014-12-29 ENCOUNTER — Inpatient Hospital Stay (HOSPITAL_COMMUNITY): Payer: Federal, State, Local not specified - PPO

## 2014-12-29 LAB — BASIC METABOLIC PANEL
Anion gap: 13 (ref 5–15)
BUN: 9 mg/dL (ref 6–23)
CO2: 22 mmol/L (ref 19–32)
CREATININE: 0.52 mg/dL (ref 0.50–1.10)
Calcium: 6.6 mg/dL — ABNORMAL LOW (ref 8.4–10.5)
Chloride: 100 mEq/L (ref 96–112)
GFR calc non Af Amer: >90 mL/min (ref >90)
Glucose, Bld: 134 mg/dL — ABNORMAL HIGH (ref 70–99)
POTASSIUM: 3.4 mmol/L — AB (ref 3.5–5.1)
Sodium: 135 mmol/L (ref 135–145)

## 2014-12-29 LAB — TYPE AND SCREEN
ABO/RH(D): O POS
Antibody Screen: NEGATIVE
Unit division: 0
Unit division: 0

## 2014-12-29 LAB — CBC
HCT: 32 % — ABNORMAL LOW (ref 36.0–46.0)
Hemoglobin: 10.7 g/dL — ABNORMAL LOW (ref 12.0–15.0)
MCH: 31.4 pg (ref 26.0–34.0)
MCHC: 33.4 g/dL (ref 30.0–36.0)
MCV: 93.8 fL (ref 78.0–100.0)
Platelets: 108 10*3/uL — ABNORMAL LOW (ref 150–400)
RBC: 3.41 MIL/uL — AB (ref 3.87–5.11)
RDW: 17.4 % — ABNORMAL HIGH (ref 11.5–15.5)
WBC: 6.6 10*3/uL (ref 4.0–10.5)

## 2014-12-29 LAB — CALCIUM, IONIZED: CALCIUM ION: 0.93 mmol/L — AB (ref 1.12–1.32)

## 2014-12-29 LAB — CALCIUM: Calcium: 6.6 mg/dL — ABNORMAL LOW (ref 8.4–10.5)

## 2014-12-29 LAB — VITAMIN D 25 HYDROXY (VIT D DEFICIENCY, FRACTURES): Vit D, 25-Hydroxy: 7.2 ng/mL — ABNORMAL LOW (ref 30.0–100.0)

## 2014-12-29 LAB — GLUCOSE, CAPILLARY
GLUCOSE-CAPILLARY: 208 mg/dL — AB (ref 70–99)
Glucose-Capillary: 140 mg/dL — ABNORMAL HIGH (ref 70–99)
Glucose-Capillary: 146 mg/dL — ABNORMAL HIGH (ref 70–99)

## 2014-12-29 LAB — ALBUMIN: ALBUMIN: 2.7 g/dL — AB (ref 3.5–5.2)

## 2014-12-29 LAB — MAGNESIUM: MAGNESIUM: 1.6 mg/dL (ref 1.5–2.5)

## 2014-12-29 MED ORDER — SODIUM CHLORIDE 0.9 % IV SOLN
2.0000 g | Freq: Once | INTRAVENOUS | Status: AC
  Administered 2014-12-29: 2 g via INTRAVENOUS
  Filled 2014-12-29 (×3): qty 20

## 2014-12-29 MED ORDER — MAGNESIUM SULFATE IN D5W 10-5 MG/ML-% IV SOLN: 1.0000 g | Freq: Once | INTRAVENOUS | Status: DC

## 2014-12-29 MED ORDER — MAGNESIUM SULFATE 50 % IJ SOLN
INTRAVENOUS | Status: DC
  Filled 2014-12-29 (×12): qty 50

## 2014-12-29 MED ORDER — MAGNESIUM SULFATE 2 GM/50ML IV SOLN
2.0000 g | Freq: Once | INTRAVENOUS | Status: DC
  Filled 2014-12-29: qty 50

## 2014-12-29 MED ORDER — MAGNESIUM SULFATE 2 GM/50ML IV SOLN
2.0000 g | Freq: Once | INTRAVENOUS | Status: AC
  Administered 2014-12-29: 2 g via INTRAVENOUS
  Filled 2014-12-29 (×2): qty 50

## 2014-12-29 MED ORDER — ACETAMINOPHEN 500 MG PO TABS
1000.0000 mg | ORAL_TABLET | Freq: Three times a day (TID) | ORAL | Status: DC
  Administered 2014-12-29 – 2014-12-31 (×8): 1000 mg via ORAL
  Filled 2014-12-29 (×12): qty 2

## 2014-12-29 MED ORDER — POTASSIUM CHLORIDE CRYS ER 20 MEQ PO TBCR
40.0000 meq | EXTENDED_RELEASE_TABLET | ORAL | Status: AC
  Administered 2014-12-29 (×2): 40 meq via ORAL
  Filled 2014-12-29 (×2): qty 2

## 2014-12-29 NOTE — Progress Notes (Signed)
Inpatient Diabetes Program Recommendations  AACE/ADA: New Consensus Statement on Inpatient Glycemic Control (2013)  Target Ranges:  Prepandial:   less than 140 mg/dL      Peak postprandial:   less than 180 mg/dL (1-2 hours)      Critically ill patients:  140 - 180 mg/dL   Results for SHAKEVIA, SARRIS (MRN [id number]) as of 12/29/2014 14:08  Ref. Range 12/28/2014 17:04 12/28/2014 23:00 12/29/2014 07:14 12/29/2014 12:17  Glucose-Capillary Latest Range: 70-99 mg/dL 252 (H) 149 (H) 140 (H) 146 (H)   Diabetes history: DM2 Outpatient Diabetes medications: None Current orders for Inpatient glycemic control: None  Inpatient Diabetes Program Recommendations Correction (SSI): While inpatient, please consider ordering CBGs with Novolog sensitive correction scale ACHS.  Thanks, Barnie Alderman, RN, MSN, CCRN, CDE Diabetes Coordinator Inpatient Diabetes Program [phone] (Team Pager) [phone] (AP office) [phone] [facility] office)

## 2014-12-29 NOTE — Progress Notes (Signed)
Physical Therapy Treatment Patient Details Name: [name] MRN: [id number] DOB: 1951/02/07 Today's Date: 12/29/2014    History of Present Illness Pt is a 64 y/o female admitted s/p direct anterior total hip replacement. Pt is WBAT.     PT Comments    Pt able to mobilize ~6' with RW and 2 person (A). Pt fatigued very quickly and requiring 2 person (A) for safety. Pt saturated with urine upon arrival,pt (A) with peri-care and RN made aware. Cont to recommend SNF for post acute rehab.   Follow Up Recommendations  SNF;Supervision/Assistance - 24 hour     Equipment Recommendations  Rolling walker with 5" wheels;3in1 (PT)    Recommendations for Other Services       Precautions / Restrictions Precautions Precautions: Fall Restrictions Weight Bearing Restrictions: Yes RLE Weight Bearing: Weight bearing as tolerated    Mobility  Bed Mobility Overal bed mobility: Needs Assistance Bed Mobility: Supine to Sit     Supine to sit: Min assist;HOB elevated     General bed mobility comments: (A) to mobilize Rt LE to/off EOB; cues for sequencing   Transfers Overall transfer level: Needs assistance Equipment used: Rolling walker (2 wheeled) Transfers: Sit to/from Stand Sit to Stand: Mod assist;+2 physical assistance         General transfer comment: per person (A) for safety and to elevate to standing position; pt c/o incr pain with WB'ing through Rt LE; max cues for hand placement and sequencing   Ambulation/Gait Ambulation/Gait assistance: Mod assist;+2 physical assistance;+2 safety/equipment Ambulation Distance (Feet): 6 Feet Assistive device: Rolling walker (2 wheeled) Gait Pattern/deviations: Step-to pattern;Decreased dorsiflexion - right;Decreased stride length;Narrow base of support;Trunk flexed Gait velocity: very decr Gait velocity interpretation: Below normal speed for age/gender General Gait Details: max facilitation to advance Rt LE; 2 person (A for safety and  max multimodal cues for sequencing and management of RW   Stairs            Wheelchair Mobility    Modified Rankin (Stroke Patients Only)       Balance Overall balance assessment: Needs assistance Sitting-balance support: Feet supported;No upper extremity supported Sitting balance-Leahy Scale: Fair     Standing balance support: During functional activity;Bilateral upper extremity supported Standing balance-Leahy Scale: Zero Standing balance comment: 2 person (A) and RW to balance; total care for pericare                     Cognition Arousal/Alertness: Awake/alert Behavior During Therapy: WFL for tasks assessed/performed Overall Cognitive Status: Within Functional Limits for tasks assessed                      Exercises General Exercises - Lower Extremity Ankle Circles/Pumps: 10 reps;Both;AROM Long Arc Quad: AROM;AAROM;Both;10 reps;Seated Heel Slides: AAROM;Right;5 reps;Supine Hip ABduction/ADduction: AAROM;Right;5 reps;Supine    General Comments General comments (skin integrity, edema, etc.): pt saturated with urine in depends upon arrival; encouraged pt to request RN or tech change brief ~1 hour to prevent skin breakdown      Pertinent Vitals/Pain Pain Assessment: 0-10 Pain Score: 5  Pain Location: 5 in Rt hip with movement; denies any pain at rest Pain Descriptors / Indicators: Aching Pain Intervention(s): Monitored during session;Premedicated before session;Repositioned;Ice applied    Home Living                      Prior Function            PT Goals (  current goals can now be found in the care plan section) Acute Rehab PT Goals Patient Stated Goal: to go home after this  PT Goal Formulation: With patient Time For Goal Achievement: 01/11/15 Potential to Achieve Goals: Good Progress towards PT goals: Progressing toward goals    Frequency  7X/week    PT Plan Current plan remains appropriate    Co-evaluation              End of Session Equipment Utilized During Treatment: Gait belt Activity Tolerance: Patient limited by fatigue;Patient limited by pain Patient left: in chair;with call bell/phone within reach;with chair alarm set     Time: 1023-1050 PT Time Calculation (min) (ACUTE ONLY): 27 min  Charges:  $Gait Training: 8-22 mins $Therapeutic Exercise: 8-22 mins                    G CodesGustavus Bryant, [place]  [phone] 12/29/2014, 1:16 PM

## 2014-12-29 NOTE — Progress Notes (Signed)
[facility] PROGRESS NOTE  [name] KTG:[id number] DOB: 07-22-1951 DOA: 12/27/2014 PCP: Dena Billet BETH, PA-C  Assessment/Plan:  End stage degenerative joint disease, right -s/p R hip arthroplasty 12/27/14 - failed long term OP conservative management  -Repeat Xray right hip/ pelvis 12/28/14- no abnormalities -Ortho following- PT daily weightbearing as tolerated on right with walker, D/C to SNF tomorrow 12/30/14 if stable, continue Tylenol 100mg  q 8 hrs for pain.  Hypocalcemia -complains of left leg cramp, + Chovstek sign -Ca 6.6 -EKG- NSR, nospecific ST/T abnormalitis -Continue IV Ca, oral Ca and Vit D supp -repeat BMET in am  Hypomagnesemia -Mg normal 1.6 -Ordered 2g IV mg -repeat BEMT in am  Diastolic CHF -With mild LE edema -Continue Lasix 80mg  daily  Anxiety -Continue Celexa 20mg  daily   DVT Prophylaxis SCDs, ASA 325 mg BID Code Status: Full Family Communication: No family at bedside Disposition Plan: Inpatient   Consultants:  Ortho surgery  Procedures:  Total hip arthroplasty anterior approach (right) 12/27/14  Antibiotics:  None  HPI/Subjective: [name] is a 64 yo female with PMH of pain and functional disability in the right hip due to arthritis (failed conservative tx with NSAIDS/analgesics), diastolic CHF, Anxiety, that presents with worsening right hip pain. Pain is worse at night, and with weight bearing.  She has assocaited crepitus and joint swelling. MRI right hip with evidence of joint space narrowing. Patient is admitted for further management. Complains of left leg cramp, and right hip pain.  Denies cp, sob.   Objective: Filed Vitals:   12/29/14 0612  BP: 143/55  Pulse: 87  Temp: 98.2 F (36.8 C)  Resp: 14    Intake/Output Summary (Last 24 hours) at 12/29/14 1133 Last data filed at 12/29/14 0900  Gross per 24 hour  Intake 1474.92 ml  Output      0 ml  Net 1474.92 ml   Filed Weights   12/27/14 1122 12/27/14 1128   Weight: 72.576 kg (160 lb) 72.576 kg (160 lb)    Exam:  Gen: Alert Caucasian female, in NAD HEENT: + Chovstek sign Chest: clear to auscultate bilaterally, no ronchi or rales  Cardiac: Regular rate and rhythm, S1-S2, no rubs murmurs or gallops  Abdomen: soft, non tender, non distended, +bowel sounds. No guarding or rigidity  Extremities: Symmetrical in appearance without cyanosis. 1+ edema Neurological: Alert awake oriented to time place and person.    Data Reviewed: Basic Metabolic Panel:  Recent Labs Lab 12/28/14 0601 12/28/14 0607 12/29/14 0751 12/29/14 1018  NA  --  133* 135  --   K  --  3.5 3.4*  --   CL  --  106 100  --   CO2  --  23 22  --   GLUCOSE  --  155* 134*  --   BUN  --  12 9  --   CREATININE  --  0.66 0.52  --   CALCIUM  --  5.4* 6.6* 6.6*  MG 1.5  --   --  1.6   Liver Function Tests:  Recent Labs Lab 12/28/14 0601 12/29/14 0751  AST 28  --   ALT 26  --   ALKPHOS 159*  --   BILITOT 0.4  --   PROT 4.7*  --   ALBUMIN 2.6* 2.7*   No results for input(s): LIPASE, AMYLASE in the last 168 hours. No results for input(s): AMMONIA in the last 168 hours. CBC:  Recent Labs Lab 12/28/14 0607 12/29/14 0756  WBC 5.5 6.6  HGB 8.1* 10.7*  HCT 24.7* 32.0*  MCV 99.6 93.8  PLT 127* 108*   Cardiac Enzymes: No results for input(s): CKTOTAL, CKMB, CKMBINDEX, TROPONINI in the last 168 hours. BNP (last 3 results) No results for input(s): PROBNP in the last 8760 hours. CBG:  Recent Labs Lab 12/28/14 0643 12/28/14 1232 12/28/14 1704 12/28/14 2300 12/29/14 0714  GLUCAP 121* 165* 252* 149* 140*    No results found for this or any previous visit (from the past 240 hour(s)).   Studies: Dg Hip Operative Unilat With Pelvis Right  12/27/2014   CLINICAL DATA:  Right total hip arthroplasty  EXAM: OPERATIVE RIGHT HIP WITH PELVIS  COMPARISON:  Right hip radiographs 10/19/2014  FINDINGS: Fluoroscopic spot images demonstrate placement of a right total hip  arthroplasty. The components are well seated. No complicating features.  IMPRESSION: Well seated components of a total right hip arthroplasty. No complicating features.   Electronically Signed   By: Kalman Jewels M.D.   On: 12/27/2014 15:14   Dg Hip Unilat With Pelvis 2-3 Views Right  12/29/2014   CLINICAL DATA:  Right hip pain for 2 days, post right hip replacement  EXAM: DG HIP W/ PELVIS 2-3V*R*  COMPARISON:  12/27/2014  FINDINGS: Four views of the right hip submitted. Again noted right hip prosthesis in anatomic alignment. No acute fracture or subluxation. No evidence of prosthesis loosening.  IMPRESSION: Right hip prosthesis in anatomic alignment. No evidence of prosthesis loosening.   Electronically Signed   By: Lahoma Crocker M.D.   On: 12/29/2014 10:09    Scheduled Meds: . acetaminophen  1,000 mg Oral 3 times per day  . aspirin EC  325 mg Oral BID PC  . calcium citrate  200 mg of elemental calcium Oral BID WC  . calcium gluconate  2 g Intravenous Once  . citalopram  20 mg Oral Daily  . docusate sodium  100 mg Oral BID  . furosemide  80 mg Oral Daily  . magnesium sulfate 1 - 4 g bolus IVPB  2 g Intravenous Once  . magnesium sulfate 1 - 4 g bolus IVPB  2 g Intravenous Once  . magnesium sulfate 1 - 4 g bolus IVPB  2 g Intravenous Once  . morphine  30 mg Oral 3 times per day  . pantoprazole  80 mg Oral Q1200  . potassium chloride  40 mEq Oral Q4H  . Vitamin D (Ergocalciferol)  50,000 Units Oral Daily  . [START ON 01/05/2015] Vitamin D (Ergocalciferol)  50,000 Units Oral Q7 days   Continuous Infusions: . sodium chloride 100 mL/hr at 12/27/14 2203  . dextrose 5 % 50 mL with magnesium sulfate 2 g infusion      Principal Problem:   Primary osteoarthritis of right hip Active Problems:   Hypocalcemia    Time spent: [place], [facility]  [facility] Pager [phone]. If 7PM-7AM, please contact night-coverage at [email or web address], password [facility] 12/29/2014, 11:33 AM  LOS: 2 days

## 2014-12-29 NOTE — Plan of Care (Signed)
Problem: Consults Goal: Diagnosis- Total Joint Replacement Primary Total Hip     

## 2014-12-29 NOTE — Progress Notes (Signed)
Subjective: 2 Days Post-Op Procedure(s) (LRB): TOTAL HIP ARTHROPLASTY ANTERIOR APPROACH (Right) Patient reports pain as severe in the right hip area with weightbearing. Feels more energetic since blood transfusion yesterday. Is complaining of neck stiffness. Taking by mouth and voiding okay. We appreciate hospitalists help related to her hypocalcemia. They have ordered IV calcium. The patient lives alone and will need skilled nursing facility. She is interested in [facility].  Objective: Vital signs in last 24 hours: Temp:  [97.5 F (36.4 C)-100.5 F (38.1 C)] 98.2 F (36.8 C) (01/13 0612) Pulse Rate:  [86-99] 87 (01/13 0612) Resp:  [12-18] 14 (01/13 0612) BP: (112-143)/(43-55) 143/55 mmHg (01/13 0612) SpO2:  [90 %-97 %] 97 % (01/13 0612)  Intake/Output from previous day: 01/12 0701 - 01/13 0700 In: 1474.9 [P.O.:720; Blood:754.9] Out: -  Intake/Output this shift:     Recent Labs  12/28/14 0607 12/29/14 0756  HGB 8.1* 10.7*    Recent Labs  12/28/14 0607 12/29/14 0756  WBC 5.5 6.6  RBC 2.48* 3.41*  HCT 24.7* 32.0*  PLT 127* 108*    Recent Labs  12/28/14 0607  NA 133*  K 3.5  CL 106  CO2 23  BUN 12  CREATININE 0.66  GLUCOSE 155*  CALCIUM 5.4*   No results for input(s): LABPT, INR in the last 72 hours. Right hip exam: Right hip dressing is clean and dry. No significant swelling in right hip. Moderate discomfort with range of motion. No calf tenderness or significant swelling. Moves foot actively with normal sensation. She does complain of pain with weightbearing on the right hip.   Assessment/Plan: 2 Days Post-Op Procedure(s) (LRB): TOTAL HIP ARTHROPLASTY ANTERIOR APPROACH (Right) Hypocalcemia, being treated and worked up by internal medicine hospitalists. Plan: Continue medical workup/treatment. I will order a portable AP of the pelvis and lateral of the right hip status post right total hip replacement to make sure there are no prosthetic  complications. Continue aspirin 325 mg enteric-coated twice daily with SCDs for DVT prophylaxis. Continue physical therapy daily weightbearing as tolerated on the right with a walker. We will plan discharge to skilled nursing facility tomorrow if medically stable. The patient takes 1000 mg Tylenol every 8 hours at home for pain and has asked that I renew this.  Marcelles Clinard G 12/29/2014, 8:58 AM

## 2014-12-29 NOTE — Clinical Social Work Psychosocial (Signed)
Clinical Social Work Department BRIEF PSYCHOSOCIAL ASSESSMENT 12/29/2014  Patient:  [name], [name]     Account Number:  [id number]     Admit date:  12/27/2014  Clinical Social Worker:  Wylene Men  Date/Time:  12/28/2014 11:49 AM  Referred by:  Physician  Date Referred:  12/28/2014 Referred for  SNF Placement  Psychosocial assessment   Other Referral:   none   Interview type:  Patient Other interview type:   none    PSYCHOSOCIAL DATA Living Status:  ALONE Admitted from facility:   Level of care:   Primary support name:  [name] Primary support relationship to patient:  SIBLING Degree of support available:   adequate    CURRENT CONCERNS Current Concerns  Post-Acute Placement   Other Concerns:   none    SOCIAL WORK ASSESSMENT / PLAN CSW assessed pt at bedside.  Patient is alert and oriented. PT is recommending STR/SNF once patient is medically discharged.  Patient is awarea and agreeable.  Patient reports living at home alone and considers her brother [name] to be her main support.  Patient is agreeable to SNF search of [facility] and has stated her first SNF choice is [facility].  Patient is anticipating returning home living alone and being independent after completion of STR.   Assessment/plan status:  Psychosocial Support/Ongoing Assessment of Needs Other assessment/ plan:   FL2  PASARR   Information/referral to community resources:   SNF/STR    PATIENT'S/FAMILY'S RESPONSE TO PLAN OF CARE: patient is agreeable to SNF search of [facility]. Patient first choice is [facility].  Patient expressed appreciation of CSW assistance.       Nonnie Done, [place] [phone]  Psychiatric & Orthopedics (5N 1-16) Clinical Social Worker

## 2014-12-29 NOTE — Clinical Social Work Placement (Addendum)
Clinical Social Work Department CLINICAL SOCIAL WORK PLACEMENT NOTE 12/29/2014  Patient:  [name], [name]  Account Number:  [id number] Admit date:  12/27/2014  Clinical Social Worker:  Wylene Men  Date/time:  12/28/2014 11:53 AM  Clinical Social Work is seeking post-discharge placement for this patient at the following level of care:   [place]   (*CSW will update this form in Epic as items are completed)   12/28/2014  Patient/family provided with [facility] Department of Clinical Social Work's list of facilities offering this level of care within the geographic area requested by the patient (or if unable, by the patient's family).  12/28/2014  Patient/family informed of their freedom to choose among providers that offer the needed level of care, that participate in Medicare, Medicaid or managed care program needed by the patient, have an available bed and are willing to accept the patient.  12/28/2014  Patient/family informed of [facility]' ownership interest in [facility], as well as of the fact that they are under no obligation to receive care at this facility.  PASARR submitted to EDS on 12/28/2014 PASARR number received on 12/28/2014  FL2 transmitted to all facilities in geographic area requested by pt/family on  12/28/2014 FL2 transmitted to all facilities within larger geographic area on   Patient informed that his/her managed care company has contracts with or will negotiate with  certain facilities, including the following:     Patient/family informed of bed offers received:  12/31/2014 Patient chooses bed at [facility] Physician recommends and patient chooses bed at    Patient to be transferred to [facility] on  12/31/2014 Patient to be transferred to facility by [facility] Patient and family notified of transfer on 12/31/2014 Name of family member notified:    The following physician request were entered in Epic:   Additional  Comments: patient is alert and oriented. Patient is a letter of guarantee (LOG) due to insurance not having SNF benefit and SNF not being able to negotiate a rate.  Patient is aware and agreeable to placement at [facility].  Water engineer approved 30 day LOG.  Nonnie Done, [place] [phone]  Psychiatric & Orthopedics (5N 1-16) Clinical Social Worker

## 2014-12-30 ENCOUNTER — Ambulatory Visit: Payer: Federal, State, Local not specified - PPO | Admitting: Cardiovascular Disease

## 2014-12-30 DIAGNOSIS — E119 Type 2 diabetes mellitus without complications: Secondary | ICD-10-CM

## 2014-12-30 LAB — BASIC METABOLIC PANEL
Anion gap: 13 (ref 5–15)
BUN: 8 mg/dL (ref 6–23)
CO2: 27 mmol/L (ref 19–32)
CREATININE: 0.67 mg/dL (ref 0.50–1.10)
Calcium: 7.3 mg/dL — ABNORMAL LOW (ref 8.4–10.5)
Chloride: 99 mEq/L (ref 96–112)
GFR calc Af Amer: >90 mL/min (ref >90)
GFR calc non Af Amer: >90 mL/min (ref >90)
GLUCOSE: 156 mg/dL — AB (ref 70–99)
POTASSIUM: 3.8 mmol/L (ref 3.5–5.1)
SODIUM: 139 mmol/L (ref 135–145)

## 2014-12-30 LAB — CBC
HEMATOCRIT: 31.5 % — AB (ref 36.0–46.0)
Hemoglobin: 10.5 g/dL — ABNORMAL LOW (ref 12.0–15.0)
MCH: 32.2 pg (ref 26.0–34.0)
MCHC: 33.3 g/dL (ref 30.0–36.0)
MCV: 96.6 fL (ref 78.0–100.0)
PLATELETS: 106 10*3/uL — AB (ref 150–400)
RBC: 3.26 MIL/uL — AB (ref 3.87–5.11)
RDW: 16.9 % — ABNORMAL HIGH (ref 11.5–15.5)
WBC: 7.3 10*3/uL (ref 4.0–10.5)

## 2014-12-30 LAB — TYPE AND SCREEN
ABO/RH(D): O POS
ANTIBODY SCREEN: NEGATIVE
UNIT DIVISION: 0

## 2014-12-30 LAB — GLUCOSE, CAPILLARY
GLUCOSE-CAPILLARY: 184 mg/dL — AB (ref 70–99)
Glucose-Capillary: 205 mg/dL — ABNORMAL HIGH (ref 70–99)
Glucose-Capillary: 162 mg/dL — ABNORMAL HIGH (ref 70–99)

## 2014-12-30 LAB — MAGNESIUM: MAGNESIUM: 1.6 mg/dL (ref 1.5–2.5)

## 2014-12-30 LAB — ALBUMIN: Albumin: 2.5 g/dL — ABNORMAL LOW (ref 3.5–5.2)

## 2014-12-30 LAB — TSH: TSH: 0.789 u[IU]/mL (ref 0.350–4.500)

## 2014-12-30 MED ORDER — ASPIRIN 325 MG PO TBEC: 325.0000 mg | DELAYED_RELEASE_TABLET | Freq: Two times a day (BID) | ORAL | Status: DC

## 2014-12-30 MED ORDER — SODIUM CHLORIDE 0.9 % IV SOLN
1.0000 g | Freq: Once | INTRAVENOUS | Status: AC
  Administered 2014-12-30: 1 g via INTRAVENOUS
  Filled 2014-12-30: qty 10

## 2014-12-30 MED ORDER — METOPROLOL TARTRATE 25 MG PO TABS
25.0000 mg | ORAL_TABLET | Freq: Two times a day (BID) | ORAL | Status: DC
Start: 2014-12-30 — End: 2014-12-31
  Administered 2014-12-30 – 2014-12-31 (×2): 25 mg via ORAL
  Filled 2014-12-30 (×3): qty 1

## 2014-12-30 MED ORDER — VITAMIN D (ERGOCALCIFEROL) 1.25 MG (50000 UNIT) PO CAPS: 50000.0000 [IU] | ORAL_CAPSULE | Freq: Every day | ORAL | Status: DC

## 2014-12-30 MED ORDER — METOPROLOL TARTRATE 25 MG PO TABS: 25.0000 mg | ORAL_TABLET | Freq: Two times a day (BID) | ORAL | Status: DC

## 2014-12-30 MED ORDER — INSULIN ASPART 100 UNIT/ML ~~LOC~~ SOLN
0.0000 [IU] | Freq: Three times a day (TID) | SUBCUTANEOUS | Status: DC
  Administered 2014-12-30: 2 [IU] via SUBCUTANEOUS
  Administered 2014-12-31 (×2): 1 [IU] via SUBCUTANEOUS

## 2014-12-30 MED ORDER — MORPHINE SULFATE ER BEADS 30 MG PO CP24: 30.0000 mg | ORAL_CAPSULE | Freq: Three times a day (TID) | ORAL | Status: DC

## 2014-12-30 NOTE — Progress Notes (Addendum)
[facility] PROGRESS NOTE  [name] PXT:[id number] DOB: [birth year] DOA: 12/27/2014 PCP: Dena Billet BETH, PA-C  Assessment/Plan:  End stage degenerative joint disease, right -s/p R hip arthroplasty 12/27/14 - failed long term OP conservative management  -Repeat Xray right hip/ pelvis 12/28/14- no abnormalities -Ortho following- PT daily weightbearing as tolerated on right with walker, D/C to SNF per orthopedics which is the primary team  Hypocalcemia chronic with low vitamin D levels -Much improved cramping of the left leg cramp, -ve Chovstek sign - After supplementation corrected is calcium normal, -Continue oral calcium and vitamin D supplementation, request rehabilitation M.D. to recheck BMP and albumin levels in 1-2 days. One-time outpatient follow-up with nephrology for close calcium level monitoring.  Hypomagnesemia -Replaced and normal  Chronic Diastolic CHF EF 62% -Compensated, -Continue Lasix 80mg  daily, follow with primary cardiologist outpatient in 1-2 weeks   Asymptomatic breif SVT on tele 12-30-14  - EKG, TSH, low dose lopressor, monitor. - if no further issues Oupatient Cards follow up.   Anxiety -Continue Celexa 20mg  daily     DVT Prophylaxis SCDs, ASA 325 mg BID per orthopedics which is the primary team Code Status: Full Family Communication: No family at bedside Disposition Plan: SNF   Procedures:  Total hip arthroplasty anterior approach (right) 12/27/14  Antibiotics:  None  HPI/Subjective:  Patient in bed, denies any headache chest or abdominal pain. No shortness of breath. Minimal post op left hip pain. Minimal cramping in the left leg at times.  Objective: Filed Vitals:   12/30/14 0600  BP: 135/51  Pulse: 85  Temp: 98.5 F (36.9 C)  Resp: 16    Intake/Output Summary (Last 24 hours) at 12/30/14 0857 Last data filed at 12/30/14 0520  Gross per 24 hour  Intake   1110 ml  Output      0 ml  Net   1110 ml   Filed Weights    12/27/14 1122 12/27/14 1128  Weight: 72.576 kg (160 lb) 72.576 kg (160 lb)    Exam:  Gen: Alert Caucasian female, in NAD HEENT: + Chovstek sign Chest: clear to auscultate bilaterally, no ronchi or rales  Cardiac: Regular rate and rhythm, S1-S2, no rubs murmurs or gallops  Abdomen: soft, non tender, non distended, +bowel sounds. No guarding or rigidity  Extremities: Symmetrical in appearance without cyanosis. 1+ edema Neurological: Alert awake oriented to time place and person.    Data Reviewed: Basic Metabolic Panel:  Recent Labs Lab 12/28/14 0601 12/28/14 0607 12/29/14 0751 12/29/14 1018 12/30/14 0455  NA  --  133* 135  --  139  K  --  3.5 3.4*  --  3.8  CL  --  106 100  --  99  CO2  --  23 22  --  27  GLUCOSE  --  155* 134*  --  156*  BUN  --  12 9  --  8  CREATININE  --  0.66 0.52  --  0.67  CALCIUM  --  5.4* 6.6* 6.6* 7.3*  MG 1.5  --   --  1.6 1.6   Liver Function Tests:  Recent Labs Lab 12/28/14 0601 12/29/14 0751 12/30/14 0455  AST 28  --   --   ALT 26  --   --   ALKPHOS 159*  --   --   BILITOT 0.4  --   --   PROT 4.7*  --   --   ALBUMIN 2.6* 2.7* 2.5*   No results for input(s): LIPASE,  AMYLASE in the last 168 hours. No results for input(s): AMMONIA in the last 168 hours. CBC:  Recent Labs Lab 12/28/14 0607 12/29/14 0756 12/30/14 0455  WBC 5.5 6.6 7.3  HGB 8.1* 10.7* 10.5*  HCT 24.7* 32.0* 31.5*  MCV 99.6 93.8 96.6  PLT 127* 108* 106*   Cardiac Enzymes: No results for input(s): CKTOTAL, CKMB, CKMBINDEX, TROPONINI in the last 168 hours. BNP (last 3 results) No results for input(s): PROBNP in the last 8760 hours. CBG:  Recent Labs Lab 12/28/14 1704 12/28/14 2300 12/29/14 0714 12/29/14 1217 12/29/14 1701  GLUCAP 252* 149* 140* 146* 208*    No results found for this or any previous visit (from the past 240 hour(s)).   Studies: Dg Hip Unilat With Pelvis 2-3 Views Right  12/29/2014   CLINICAL DATA:  Right hip pain for 2 days,  post right hip replacement  EXAM: DG HIP W/ PELVIS 2-3V*R*  COMPARISON:  12/27/2014  FINDINGS: Four views of the right hip submitted. Again noted right hip prosthesis in anatomic alignment. No acute fracture or subluxation. No evidence of prosthesis loosening.  IMPRESSION: Right hip prosthesis in anatomic alignment. No evidence of prosthesis loosening.   Electronically Signed   By: Lahoma Crocker M.D.   On: 12/29/2014 10:09    Scheduled Meds: . acetaminophen  1,000 mg Oral 3 times per day  . aspirin EC  325 mg Oral BID PC  . calcium citrate  200 mg of elemental calcium Oral BID WC  . calcium gluconate  1 g Intravenous Once  . citalopram  20 mg Oral Daily  . docusate sodium  100 mg Oral BID  . furosemide  80 mg Oral Daily  . morphine  30 mg Oral 3 times per day  . pantoprazole  80 mg Oral Q1200  . Vitamin D (Ergocalciferol)  50,000 Units Oral Daily  . [START ON 01/05/2015] Vitamin D (Ergocalciferol)  50,000 Units Oral Q7 days   Continuous Infusions: . sodium chloride Stopped (12/29/14 2134)    Principal Problem:   Primary osteoarthritis of right hip Active Problems:   Hypocalcemia    Time spent: 51   Lala Lund K M.D on 12/30/2014 at 8:58 AM  Between 7am to 7pm - Pager - [phone], After 7pm go to [email or web address] - password [email or web address]  And look for the night coverage person covering me after hours  [place]  [phone]  12/30/2014, 8:57 AM  LOS: 3 days

## 2014-12-30 NOTE — Progress Notes (Signed)
Run of SVT @ 1408 - patient asymptomatic - denies CP or SOB. Skin w/d, reg resp. Pulse = 74, RR= 20/min, 114/46 on RA,dinamap. Oyygen sat. 98 % RA.

## 2014-12-30 NOTE — Progress Notes (Signed)
Subjective: 3 Days Post-Op Procedure(s) (LRB): TOTAL HIP ARTHROPLASTY ANTERIOR APPROACH (Right) Patient reports pain as moderate. Patient taking by mouth and voiding okay. No complaints of chest pain or shortness of breath.  Objective: Vital signs in last 24 hours: Temp:  [98.5 F (36.9 C)-99.3 F (37.4 C)] 98.5 F (36.9 C) (01/14 0600) Pulse Rate:  [85-86] 85 (01/14 0600) Resp:  [14-16] 16 (01/14 0600) BP: (132-135)/(47-51) 135/51 mmHg (01/14 0600) SpO2:  [96 %] 96 % (01/14 0600)  Intake/Output from previous day: 01/13 0701 - 01/14 0700 In: 1110 [P.O.:1110] Out: -  Intake/Output this shift:     Recent Labs  12/28/14 0607 12/29/14 0756 12/30/14 0455  HGB 8.1* 10.7* 10.5*    Recent Labs  12/29/14 0756 12/30/14 0455  WBC 6.6 7.3  RBC 3.41* 3.26*  HCT 32.0* 31.5*  PLT 108* 106*    Recent Labs  12/29/14 0751 12/29/14 1018 12/30/14 0455  NA 135  --  139  K 3.4*  --  3.8  CL 100  --  99  CO2 22  --  27  BUN 9  --  8  CREATININE 0.52  --  0.67  GLUCOSE 134*  --  156*  CALCIUM 6.6* 6.6* 7.3*   No results for input(s): LABPT, INR in the last 72 hours. Right hip exam: Neurologically intact Neurovascular intact Sensation intact distally Intact pulses distally Dorsiflexion/Plantar flexion intact Incision: dressing C/D/I Compartment soft  Assessment/Plan: 3 Days Post-Op Procedure(s) (LRB): TOTAL HIP ARTHROPLASTY ANTERIOR APPROACH (Right)  Type 2 diabetes. Plan: Discharge to SNF when arrangements made. I will start her on sensitive sliding scale insulin to manage her blood sugars. Weight-bear as tolerated on the right without hip precautions. Enteric-coated aspirin 325 mg twice daily with food for DVT prophylaxis 1 month postop. She will need a follow-up with Dr. Berenice Primas at [place] in 2 weeks.  Jadden Yim G 12/30/2014, 1:05 PM

## 2014-12-30 NOTE — Clinical Social Work Note (Addendum)
1:03pm- CSW spoke with Jenkins Rouge at [facility] who states she is willing to negotiate rates with [employer] and will start the insurance authorization process.  PA, RN and patient all updated and made aware.    11:09am- CSW met with patient to review bed offers.  Patient first choice was [facility] who reports not being contracted with the patient's insurance.  Of report, patient's insurance does not have a SNF benefit.  Rates would need to be negotiated with [employer].  Patient has chosen [facility] as her second choice for STR.  CSW contacted Peggy at [facility] to inquire if SNF would be willing to negotiate rates.  CSW awaiting a return call.  Nonnie Done, [place] [phone]  Psychiatric & Orthopedics (5N 1-16) Clinical Social Worker

## 2014-12-30 NOTE — Progress Notes (Signed)
Physical Therapy Treatment Patient Details Name: [name] MRN: [id number] DOB: March 05, 1951 Today's Date: 12/30/2014    History of Present Illness Pt is a 64 y/o female admitted s/p direct anterior total hip replacement. Pt is WBAT.     PT Comments    Pt motivated to participate in therapy. Continues to have difficulties with incontinence that is new for her. Hopes to D/C to SNF today. Cont to follow per POC.   Follow Up Recommendations  SNF;Supervision/Assistance - 24 hour     Equipment Recommendations  Rolling walker with 5" wheels;3in1 (PT)    Recommendations for Other Services       Precautions / Restrictions Precautions Precautions: Fall Precaution Comments: pt is incontinent "at times"  Restrictions Weight Bearing Restrictions: Yes RLE Weight Bearing: Weight bearing as tolerated    Mobility  Bed Mobility Overal bed mobility: Needs Assistance Bed Mobility: Supine to Sit     Supine to sit: Min assist;HOB elevated     General bed mobility comments: (A) to manage Rt LE to/off EOB; cues for hand placement and sequencing; pt using UEs to also manage Rt LE   Transfers Overall transfer level: Needs assistance Equipment used: Rolling walker (2 wheeled) Transfers: Sit to/from [facility] Sit to Stand: Min assist;+2 safety/equipment Stand pivot transfers: Mod assist;+2 physical assistance       General transfer comment: performing SPT initially to [facility]; cues for hand placement and sequencing; (A0 to maintain balance   Ambulation/Gait Ambulation/Gait assistance: Min assist;+2 safety/equipment Ambulation Distance (Feet): 10 Feet Assistive device: Rolling walker (2 wheeled) Gait Pattern/deviations: Step-to pattern;Decreased step length - left;Decreased stance time - right;Antalgic;Wide base of support Gait velocity: very decr Gait velocity interpretation: Below normal speed for age/gender General Gait Details: pt initially requiring facilitation  to activate Rt LE during swing phase but was able to progress to swinging Rt LE with verbal cues only; 2nd person (A) to follow with chair for safety; max cues for RW management    Stairs            Wheelchair Mobility    Modified Rankin (Stroke Patients Only)       Balance Overall balance assessment: Needs assistance Sitting-balance support: Feet supported;No upper extremity supported Sitting balance-Leahy Scale: Fair     Standing balance support: During functional activity;Bilateral upper extremity supported Standing balance-Leahy Scale: Zero Standing balance comment: (A) and RW to balance; total care at [place]: Awake/alert Behavior During Therapy: WFL for tasks assessed/performed Overall Cognitive Status: Within Functional Limits for tasks assessed                      Exercises General Exercises - Lower Extremity Ankle Circles/Pumps: 10 reps;Both;AROM Quad Sets: AROM;Right;10 reps;Seated Long Arc Quad: AROM;AAROM;Both;10 reps;Seated Heel Slides: AAROM;Right;10 reps;Supine    General Comments        Pertinent Vitals/Pain Pain Assessment: 0-10 Pain Score: 2  Pain Location: Rt hip Pain Descriptors / Indicators: Sore Pain Intervention(s): Premedicated before session;Repositioned;Monitored during session    Home Living                      Prior Function            PT Goals (current goals can now be found in the care plan section) Acute Rehab PT Goals Patient Stated Goal: to go to rehab today  PT Goal Formulation: With patient Time For Goal Achievement: 01/11/15 Potential to Achieve Goals: Good Progress towards PT goals: Progressing toward goals    Frequency  7X/week    PT Plan Current plan remains appropriate    Co-evaluation             End of Session Equipment Utilized During Treatment: Gait belt Activity Tolerance: Patient tolerated treatment well Patient left: in  chair;with call bell/phone within reach     Time: 0957-1021 PT Time Calculation (min) (ACUTE ONLY): 24 min  Charges:  $Gait Training: 8-22 mins $Therapeutic Exercise: 8-22 mins                    G CodesGustavus Bryant, [place]  [phone] 12/30/2014, 11:55 AM

## 2014-12-30 NOTE — Discharge Instructions (Signed)
Follow with your PCP or M.D. at the rehabilitation in 3 days get BMP and Albumin checked, follow with the recommended kidney doctor for chronic low calcium levels    Total Hip Replacement, Care After Refer to this sheet in the next few weeks. These instructions provide you with information on caring for yourself after your procedure. Your health care provider may also give you specific instructions. Your treatment has been planned according to the most current medical practices, but problems sometimes occur. Call your health care provider if you have any problems or questions after your procedure. HOME CARE INSTRUCTIONS  Your health care provider will give you specific precautions for certain types of movement. Additional instructions include:  Take medicines only as directed by your health care provider.  Take quick showers (3-5 min) rather than bathe until your health care provider tells you that you can take baths again.  Avoid lifting until your health care provider instructs you otherwise.  Use a raised toilet seat and avoid sitting in low chairs as instructed by your health care provider.  Use crutches or a walker as instructed by your health care provider. SEEK MEDICAL CARE IF:  You have difficulty breathing.  You have drainage, redness, or swelling at your incision site.  You have a bad smell coming from your incision site.  You have persistent bleeding from your incision site.  Your incision breaks open after sutures (stitches) or staples have been removed.  You have a fever. SEEK IMMEDIATE MEDICAL CARE IF:   You have a rash.  You have pain or swelling in your calf or thigh.  You have shortness of breath or chest pain. MAKE SURE YOU:  Understand these instructions.  Will watch your condition.  Will get help if you are not doing well or get worse. Document Released: 06/22/2005 Document Revised: 04/19/2014 Document Reviewed: 02/03/2014 [facility] Patient  Information 2015 [place], [place]. This information is not intended to replace advice given to you by your health care provider. Make sure you discuss any questions you have with your health care provider.

## 2014-12-30 NOTE — Progress Notes (Signed)
Dr. Candiss Norse made aware of SVT run, new order for EKG STAT, TSH level, and metoprolol 25mg  BID.   EKG showed NSR, metoprolol given per order, Dr. Candiss Norse made aware of EKG results, will place on metoprolol at d/c, and patient to see cardiology for follow-up at d/c.  Will round on patient in am as she is not being d/c today.  Gaspar Skeeters PA updated, no new orders.

## 2014-12-30 NOTE — Discharge Summary (Signed)
Patient ID: [name] MRN: [id number] DOB/AGE: 1951/05/29 64 y.o.  Admit date: 12/27/2014 Discharge date: 12/31/2014  Admission Diagnoses:  Principal Problem:   Primary osteoarthritis of right hip Active Problems:   Hypocalcemia   Diabetes   SVT (supraventricular tachycardia)  hypomagnesemia  Discharge Diagnoses:  Same  Past Medical History  Diagnosis Date  . Ischemic heart disease   . Diastolic dysfunction   . Chronic anemia   . History of recurrent UTIs   . Peroneal neuropathy   . Retinopathy   . Stroke   . Hypocalcemia   . Morbid obesity     s/p gastric bypass  . Depression   . CAD s/p CABG 1994 09/25/2013    LIMA-LAD, SVG Diag, SVG-RCA, [facility] Cath 2010: Stents in the proximal LAD with 40% in-stent restenosis and 50% post-stent stenosis, 80% small OM 1, 70% small OM 2, 40% first PLA; diffuse 70% distal RCA; patent SVG to diagonal, patent SVG to distal RCA, patent atretic LIMA EF 55%; normal nuclear stress test 2011  . Bilateral carotid artery occlusion 09/25/2013  . 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 Allergy   . Anxiety   . Asthma   . Cataract   . CHF (congestive heart failure)   . Diabetes mellitus without complication   . GERD (gastroesophageal reflux disease)   . Glaucoma   . Heart murmur   . Hypertension   . Hyperlipidemia   . Osteoporosis   . Arthritis     Surgeries: Procedure(s): Right TOTAL HIP ARTHROPLASTY ANTERIOR APPROACH on 12/27/2014   Consultants: Treatment Team:  Kelvin Cellar, MD  Discharged Condition: Improved  Hospital Course: [name] is an 64 y.o. female who was admitted 12/27/2014 for operative treatment ofPrimary osteoarthritis of right hip. Patient has severe unremitting pain that affects sleep, daily activities, and work/hobbies. After pre-op clearance the patient was taken to the operating room on 12/27/2014 and underwent  Procedure(s): Right TOTAL HIP ARTHROPLASTY ANTERIOR APPROACH.    Patient was  given perioperative antibiotics:      Anti-infectives    Start     Dose/Rate Route Frequency Ordered Stop   12/27/14 1900  clindamycin (CLEOCIN) IVPB 600 mg     600 mg100 mL/hr over 30 Minutes Intravenous Every 6 hours 12/27/14 1846 12/28/14 0433   12/27/14 0600  clindamycin (CLEOCIN) IVPB 900 mg     900 mg100 mL/hr over 30 Minutes Intravenous On call to O.R. 12/26/14 1345 12/27/14 1307       Patient was given sequential compression devices, early ambulation, and chemoprophylaxis to prevent DVT. Postoperatively the patient's calcium was decreased. Also had decreased serum magnesium. Internal medicine consult was obtained by the hospitalist. This was managed. She did have moderate elevation of her serum glucose postoperatively and we started her on a sensitive sliding scale insulin protocol. She made somewhat slow progress with physical therapy and it was felt that skilled nursing facility was indicated. She did complain of significant postoperative pain in the right hip and x-ray was obtained which showed no periprosthetic complications or fractures.  Patient benefited maximally from hospital stay and there were no complications.    Recent vital signs:  Patient Vitals for the past 24 hrs:  BP Temp Temp src Pulse Resp SpO2  12/31/14 0525 (!) 122/54 mmHg 98.5 F (36.9 C) - 78 18 97 %  12/30/14 2028 (!) 127/47 mmHg 98.4 F (36.9 C) - 75 18 96 %  12/30/14 1535 (!) 114/53 mmHg 99.2 F (37.3 C) Oral  81 18 98 %  12/30/14 1357 (!) 119/50 mmHg 98.4 F (36.9 C) - 76 16 98 %     Recent laboratory studies:  Recent Labs  12/29/14 0751  12/30/14 0455 12/31/14 0543  WBC  --   < > 7.3 5.4  HGB  --   < > 10.5* 10.5*  HCT  --   < > 31.5* 32.0*  PLT  --   < > 106* 120*  NA 135  --  139  --   K 3.4*  --  3.8  --   CL 100  --  99  --   CO2 22  --  27  --   BUN 9  --  8  --   CREATININE 0.52  --  0.67  --   GLUCOSE 134*  --  156*  --   CALCIUM 6.6*  < > 7.3*  --   < > = values in this  interval not displayed.   Discharge Medications:     Medication List    STOP taking these medications        doxycycline 100 MG tablet  Commonly known as:  VIBRA-TABS     morphine 30 MG 12 hr tablet  Commonly known as:  MS CONTIN  Replaced by:  morphine 30 MG 24 hr capsule     Vitamin B-12 1000 MCG Subl      TAKE these medications        acetaminophen 500 MG tablet  Commonly known as:  TYLENOL  Take 1,500 mg by mouth every 6 (six) hours as needed for mild pain.     aspirin 325 MG EC tablet  Take 1 tablet (325 mg total) by mouth 2 (two) times daily after a meal. Take 1 month post op to decrease risk of blood clots.     Calcium Citrate 1040 MG Tabs  Take 1 tablet (1,040 mg total) by mouth 2 (two) times daily with a meal.     citalopram 20 MG tablet  Commonly known as:  CELEXA  Take 1 tablet (20 mg total) by mouth daily.     furosemide 80 MG tablet  Commonly known as:  LASIX  Take 1 tablet (80 mg total) by mouth daily.     meloxicam 7.5 MG tablet  Commonly known as:  MOBIC  Take 1 tablet (7.5 mg total) by mouth daily.     methocarbamol 500 MG tablet  Commonly known as:  ROBAXIN  Take 1 tablet (500 mg total) by mouth every 8 (eight) hours as needed for muscle spasms.     metolazone 2.5 MG tablet  Commonly known as:  ZAROXOLYN  Take one tablet daily  X 3 days then as needed if weight is greater than 155 lbs.     metoprolol tartrate 25 MG tablet  Commonly known as:  LOPRESSOR  Take 1 tablet (25 mg total) by mouth 2 (two) times daily.     morphine 30 MG 24 hr capsule  Commonly known as:  AVINZA  Take 1 capsule (30 mg total) by mouth every 8 (eight) hours.     NEXIUM 40 MG capsule  Generic drug:  esomeprazole  Take 1 capsule by mouth daily.     nitroGLYCERIN 0.4 MG/SPRAY spray  Commonly known as:  NITROLINGUAL  USE ONE TO TWO SPRAYS UNDER THE TONGUE EVERY 5 MINUTES AS NEEDED FOR CHEST PAIN     potassium chloride 10 MEQ tablet  Commonly known as:  K-DUR  Take 1 tablet (10 mEq total) by mouth daily.     Vitamin D (Ergocalciferol) 50000 UNITS Caps capsule  Commonly known as:  DRISDOL  Take 1 capsule (50,000 Units total) by mouth daily.        Diagnostic Studies: Dg Chest 2 View  12/16/2014   CLINICAL DATA:  For total hip replacement  EXAM: CHEST  2 VIEW  COMPARISON:  Chest x-ray of 03/30/2014  FINDINGS: No active infiltrate or effusion is seen. Mediastinal and hilar contours are unremarkable. Mild cardiomegaly is stable. Median sternotomy sutures are noted from prior CABG. No acute bony abnormality is seen.  IMPRESSION: No active cardiopulmonary disease.  Stable mild cardiomegaly.   Electronically Signed   By: Ivar Drape M.D.   On: 12/16/2014 11:38   Dg Hip Operative Unilat With Pelvis Right  12/27/2014   CLINICAL DATA:  Right total hip arthroplasty  EXAM: OPERATIVE RIGHT HIP WITH PELVIS  COMPARISON:  Right hip radiographs 10/19/2014  FINDINGS: Fluoroscopic spot images demonstrate placement of a right total hip arthroplasty. The components are well seated. No complicating features.  IMPRESSION: Well seated components of a total right hip arthroplasty. No complicating features.   Electronically Signed   By: Kalman Jewels M.D.   On: 12/27/2014 15:14   Dg Hip Unilat With Pelvis 2-3 Views Right  12/29/2014   CLINICAL DATA:  Right hip pain for 2 days, post right hip replacement  EXAM: DG HIP W/ PELVIS 2-3V*R*  COMPARISON:  12/27/2014  FINDINGS: Four views of the right hip submitted. Again noted right hip prosthesis in anatomic alignment. No acute fracture or subluxation. No evidence of prosthesis loosening.  IMPRESSION: Right hip prosthesis in anatomic alignment. No evidence of prosthesis loosening.   Electronically Signed   By: Lahoma Crocker M.D.   On: 12/29/2014 10:09    Disposition: Skilled nursing facility  Discharge Instructions    Call MD / Call 911    Complete by:  As directed   If you experience chest pain or shortness of breath, CALL 911  and be transported to the hospital emergency room.  If you develope a fever above 101 F, pus (white drainage) or increased drainage or redness at the wound, or calf pain, call your surgeon's office.     Constipation Prevention    Complete by:  As directed   Drink plenty of fluids.  Prune juice may be helpful.  You may use a stool softener, such as Colace (over the counter) 100 mg twice a day.  Use MiraLax (over the counter) for constipation as needed.     Diet Carb Modified    Complete by:  As directed      Discharge wound care:    Complete by:  As directed   If you have a hip bandage, keep it clean and dry.  Change your bandage as instructed by your health care providers.  If your bandage has been discontinued, keep your incision clean and dry.  Pat dry after bathing.  DO NOT put lotion or powder on your incision. Apply Mepilex to the right hip incision every 4 days. Please keep the wound dry until you are 2 weeks postop.     Do not sit on low chairs, stoools or toilet seats, as it may be difficult to get up from low surfaces    Complete by:  As directed      Increase activity slowly as tolerated    Complete by:  As directed  Weight bearing as tolerated    Complete by:  As directed   Laterality:  right  Extremity:  Lower     Weight bearing as tolerated    Complete by:  As directed   No hip precautions.  Laterality:  right  Extremity:  Lower          she will need daily physical therapy weightbearing as tolerated on the right with no hip precautions.  Follow-up Information    Follow up with GRAVES,JOHN L, MD. Schedule an appointment as soon as possible for a visit in 2 weeks.   Specialty:  Orthopedic Surgery   Contact information:   [place] [address] [phone]       Follow up with [facility] BETH, PA-C. Schedule an appointment as soon as possible for a visit in 3 days.   Specialty:  Physician Assistant   Why:  get calcium level checked by SNF MD or PCP    Contact information:   [place] [address] [phone]       Follow up with Ulla Potash., MD. Schedule an appointment as soon as possible for a visit in 5 days.   Specialty:  Nephrology   Why:  Hypocalcemia   Contact information:   [place] [address] [phone]       Follow up with CROITORU,MIHAI, MD. Schedule an appointment as soon as possible for a visit in 1 week.   Specialty:  Cardiology   Why:  ? SVT   Contact information:   [address] [place] [address] [phone]        Signed: Erlene Senters 12/31/2014, 8:05 AM

## 2014-12-31 DIAGNOSIS — I471 Supraventricular tachycardia: Secondary | ICD-10-CM

## 2014-12-31 LAB — CBC
HCT: 32 % — ABNORMAL LOW (ref 36.0–46.0)
Hemoglobin: 10.5 g/dL — ABNORMAL LOW (ref 12.0–15.0)
MCH: 32 pg (ref 26.0–34.0)
MCHC: 32.8 g/dL (ref 30.0–36.0)
MCV: 97.6 fL (ref 78.0–100.0)
Platelets: 120 10*3/uL — ABNORMAL LOW (ref 150–400)
RBC: 3.28 MIL/uL — ABNORMAL LOW (ref 3.87–5.11)
RDW: 16.1 % — ABNORMAL HIGH (ref 11.5–15.5)
WBC: 5.4 10*3/uL (ref 4.0–10.5)

## 2014-12-31 LAB — GLUCOSE, CAPILLARY
GLUCOSE-CAPILLARY: 125 mg/dL — AB (ref 70–99)
GLUCOSE-CAPILLARY: 141 mg/dL — AB (ref 70–99)

## 2014-12-31 NOTE — Progress Notes (Signed)
[facility] PROGRESS NOTE  [name] UUV:[id number] DOB: 11-13-1951 DOA: 12/27/2014 PCP: Dena Billet BETH, PA-C  Assessment/Plan:  End stage degenerative joint disease, right -s/p R hip arthroplasty 12/27/14 - failed long term OP conservative management  -Repeat Xray right hip/ pelvis 12/28/14- no abnormalities -Ortho following- PT daily weightbearing as tolerated on right with walker, D/C to SNF per orthopedics which is the primary team  Hypocalcemia chronic with low vitamin D levels -Much improved cramping of the left leg cramp, -ve Chovstek sign - After supplementation corrected is calcium normal, -Continue oral calcium and vitamin D supplementation, request rehabilitation M.D. to recheck BMP and albumin levels in 1-2 days. One-time outpatient follow-up with nephrology for close calcium level monitoring.  Hypomagnesemia -Replaced and normal  Chronic Diastolic CHF EF 47% -Compensated, -Continue Lasix 80mg  daily, follow with primary cardiologist outpatient in 1-2 weeks   Asymptomatic breif SVT on tele 12-30-14  - EKG, TSH, low dose lopressor, monitor. - no further issues Oupatient Cards follow up. H rate in 70s now.   Anxiety -Continue Celexa 20mg  daily     DVT Prophylaxis SCDs, ASA 325 mg BID per orthopedics which is the primary team Code Status: Full Family Communication: No family at bedside Disposition Plan: SNF   Follow-up Information    Follow up with GRAVES,JOHN L, MD. Schedule an appointment as soon as possible for a visit in 2 weeks.   Specialty:  Orthopedic Surgery   Contact information:   [place] [address] [phone]       Follow up with [facility] BETH, PA-C. Schedule an appointment as soon as possible for a visit in 3 days.   Specialty:  Physician Assistant   Why:  get calcium level checked by SNF MD or PCP   Contact information:   [place] [address] [phone]       Follow up with  Ulla Potash., MD. Schedule an appointment as soon as possible for a visit in 5 days.   Specialty:  Nephrology   Why:  Hypocalcemia   Contact information:   [place] [address] [phone]       Follow up with CROITORU,MIHAI, MD. Schedule an appointment as soon as possible for a visit in 1 week.   Specialty:  Cardiology   Why:  ? SVT   Contact information:   [address] [place] [address] [phone]       Procedures:  Total hip arthroplasty anterior approach (right) 12/27/14  Antibiotics:  None  HPI/Subjective:  Patient in bed, denies any headache chest or abdominal pain. No shortness of breath. Minimal post op left hip pain. Minimal cramping in the left leg at times.  Objective: Filed Vitals:   12/31/14 0525  BP: 122/54  Pulse: 78  Temp: 98.5 F (36.9 C)  Resp: 18    Intake/Output Summary (Last 24 hours) at 12/31/14 0831 Last data filed at 12/30/14 1700  Gross per 24 hour  Intake    720 ml  Output      0 ml  Net    720 ml   Filed Weights   12/27/14 1122 12/27/14 1128  Weight: 72.576 kg (160 lb) 72.576 kg (160 lb)    Exam:  Gen: Alert Caucasian female, in NAD HEENT: + Chovstek sign Chest: clear to auscultate bilaterally, no ronchi or rales  Cardiac: Regular rate and rhythm, S1-S2, no rubs murmurs or gallops  Abdomen: soft, non tender, non distended, +bowel sounds.  No guarding or rigidity  Extremities: Symmetrical in appearance without cyanosis. 1+ edema Neurological: Alert awake oriented to time place and person.    Data Reviewed: Basic Metabolic Panel:  Recent Labs Lab 12/28/14 0601 12/28/14 0607 12/29/14 0751 12/29/14 1018 12/30/14 0455  NA  --  133* 135  --  139  K  --  3.5 3.4*  --  3.8  CL  --  106 100  --  99  CO2  --  23 22  --  27  GLUCOSE  --  155* 134*  --  156*  BUN  --  12 9  --  8  CREATININE  --  0.66 0.52  --  0.67  CALCIUM  --  5.4* 6.6* 6.6* 7.3*  MG 1.5  --   --  1.6 1.6   Liver  Function Tests:  Recent Labs Lab 12/28/14 0601 12/29/14 0751 12/30/14 0455  AST 28  --   --   ALT 26  --   --   ALKPHOS 159*  --   --   BILITOT 0.4  --   --   PROT 4.7*  --   --   ALBUMIN 2.6* 2.7* 2.5*   No results for input(s): LIPASE, AMYLASE in the last 168 hours. No results for input(s): AMMONIA in the last 168 hours. CBC:  Recent Labs Lab 12/28/14 0607 12/29/14 0756 12/30/14 0455 12/31/14 0543  WBC 5.5 6.6 7.3 5.4  HGB 8.1* 10.7* 10.5* 10.5*  HCT 24.7* 32.0* 31.5* 32.0*  MCV 99.6 93.8 96.6 97.6  PLT 127* 108* 106* 120*   Cardiac Enzymes: No results for input(s): CKTOTAL, CKMB, CKMBINDEX, TROPONINI in the last 168 hours. BNP (last 3 results) No results for input(s): PROBNP in the last 8760 hours. CBG:  Recent Labs Lab 12/29/14 1701 12/30/14 1127 12/30/14 1644 12/30/14 2111 12/31/14 0625  GLUCAP 208* 205* 184* 162* 125*    No results found for this or any previous visit (from the past 240 hour(s)).   Studies: Dg Hip Unilat With Pelvis 2-3 Views Right  12/29/2014   CLINICAL DATA:  Right hip pain for 2 days, post right hip replacement  EXAM: DG HIP W/ PELVIS 2-3V*R*  COMPARISON:  12/27/2014  FINDINGS: Four views of the right hip submitted. Again noted right hip prosthesis in anatomic alignment. No acute fracture or subluxation. No evidence of prosthesis loosening.  IMPRESSION: Right hip prosthesis in anatomic alignment. No evidence of prosthesis loosening.   Electronically Signed   By: Lahoma Crocker M.D.   On: 12/29/2014 10:09    Scheduled Meds: . acetaminophen  1,000 mg Oral 3 times per day  . aspirin EC  325 mg Oral BID PC  . calcium citrate  200 mg of elemental calcium Oral BID WC  . citalopram  20 mg Oral Daily  . docusate sodium  100 mg Oral BID  . furosemide  80 mg Oral Daily  . insulin aspart  0-9 Units Subcutaneous TID WC  . metoprolol tartrate  25 mg Oral BID  . morphine  30 mg Oral 3 times per day  . pantoprazole  80 mg Oral Q1200  . Vitamin D  (Ergocalciferol)  50,000 Units Oral Daily  . [START ON 01/05/2015] Vitamin D (Ergocalciferol)  50,000 Units Oral Q7 days   Continuous Infusions: . sodium chloride Stopped (12/29/14 2134)    Principal Problem:   Primary osteoarthritis of right hip Active Problems:   Hypocalcemia   Diabetes   SVT (supraventricular tachycardia)  Time spent: 65   Thurnell Lose M.D on 12/31/2014 at 8:31 AM  Between 7am to 7pm - Pager - [phone], After 7pm go to [email or web address] - password [email or web address]  And look for the night coverage person covering me after hours  [place]  [phone]  12/31/2014, 8:31 AM  LOS: 4 days

## 2014-12-31 NOTE — Clinical Social Work Note (Signed)
Patient to discharge today per MD order. Patient to discharge to: [facility] SNF ([facility]) RN to call report prior to transportation: [phone] "B wing room 201-B" Transportation: [facility]  CSW discussed discharge plans with patient.  Patient is agreeable.  RN updated.  DC summary sent to SNF for review.  SNF is agreeable and room ready.  Nonnie Done, [place] [phone]  Psychiatric & Orthopedics (5N 1-16) Clinical Social Worker

## 2014-12-31 NOTE — Evaluation (Signed)
Occupational Therapy Evaluation Patient Details Name: [name] MRN: [id number] DOB: 1951/03/16 Today's Date: 12/31/2014    History of Present Illness Pt is a 64 y/o female admitted s/p direct anterior total hip replacement. Pt is WBAT.    Clinical Impression   Patient independent PTA. Patient currently requires up to total assist for LB ADLs, mod assist for functional transfers, and supervision for UB ADLs. Patient will benefit from acute OT to increase overall independence in the areas of ADLs, functional mobility, and pain management in order to safely discharge to SNF.     Follow Up Recommendations  SNF;Supervision/Assistance - 24 hour    Equipment Recommendations   (defer to next venue)    Recommendations for Other Services  None at this time     Precautions / Restrictions Precautions Precautions: Fall Precaution Comments: pt is incontinent "at times"  Restrictions Weight Bearing Restrictions: Yes RLE Weight Bearing: Weight bearing as tolerated      Mobility Bed Mobility Overal bed mobility: Needs Assistance Bed Mobility: Rolling;Supine to Sit Rolling: Min assist   Supine to sit: Min assist;HOB elevated     General bed mobility comments: Patient required assistance for right LE management during rolling and supine>sit  Transfers Overall transfer level: Needs assistance Equipment used: None Transfers: Sit to/from [facility] Sit to Stand: Min assist Stand pivot transfers: Mod assist       General transfer comment: patient requires cues for hand placement and patient with increased anxiety during transfers    Balance Please see PT evaluation    ADL Overall ADL's : Needs assistance/impaired Eating/Feeding: Independent   Grooming: Set up;Sitting   Upper Body Bathing: Set up;Sitting   Lower Body Bathing: Total assistance;Sit to/from stand   Upper Body Dressing : Set up;Sitting   Lower Body Dressing: Sit to/from stand;Total  assistance   Toilet Transfer: Moderate assistance   Toileting- Clothing Manipulation and Hygiene: Moderate assistance;Sit to/from stand         General ADL Comments: Patient requires up to total assist for LB ADLs. Patient will benefit from use of AE to increase independence. Patient with increased anxiety and pain during sit<>stand & stand pivot transfers. Patient educated on WBAT precautions and encouraged patient to stay OOB during day to increase overall endurance. Patient states she is incontinent of her bladder. Notified nursing staff of need for +2 during transfers with staff.     Pertinent Vitals/Pain Pain Assessment: 0-10 Pain Score: 3  Pain Location: Right hip Pain Descriptors / Indicators: Sore Pain Intervention(s): Monitored during session;Repositioned     Hand Dominance Right   Extremity/Trunk Assessment Upper Extremity Assessment Upper Extremity Assessment: Generalized weakness (patient with decreased ROM throughout BUEs, premorbid)   Lower Extremity Assessment Lower Extremity Assessment: Defer to PT evaluation   Cervical / Trunk Assessment Cervical / Trunk Assessment: Normal   Communication Communication Communication: No difficulties   Cognition Arousal/Alertness: Awake/alert Behavior During Therapy: WFL for tasks assessed/performed Overall Cognitive Status: Within Functional Limits for tasks assessed             Home Living Family/patient expects to be discharged to:: Skilled nursing facility ([facility] [place])   Additional Comments: Prior to this admission, patient was living alone. Secondary to decreased independence, plan is for patient to d/c>SNF      Prior Functioning/Environment Level of Independence: Independent with assistive device(s)        Comments: Patient reports she would use a RW and cane for independence  OT Diagnosis: Generalized weakness;Acute pain   OT Problem List: Decreased range of motion;Decreased  strength;Decreased activity tolerance;Impaired balance (sitting and/or standing);Decreased coordination;Decreased safety awareness;Decreased knowledge of use of DME or AE;Decreased knowledge of precautions;Pain   OT Treatment/Interventions: Self-care/ADL training;Energy conservation;DME and/or AE instruction;Therapeutic activities;Patient/family education;Balance training;Therapeutic exercise    OT Goals(Current goals can be found in the care plan section) Acute Rehab OT Goals Patient Stated Goal: none stated OT Goal Formulation: With patient Time For Goal Achievement: 01/14/15 Potential to Achieve Goals: Good  OT Frequency: Min 2X/week   Barriers to D/C: Plan is for patient to d/c>SNF          End of Session Equipment Utilized During Treatment: Gait belt Nurse Communication: Mobility status  Activity Tolerance: Patient tolerated treatment well Patient left: in chair;with call bell/phone within reach   Time: 0855-0925 OT Time Calculation (min): 30 min Charges:  OT General Charges $OT Visit: 1 Procedure OT Evaluation $Initial OT Evaluation Tier I: 1 Procedure OT Treatments $Self Care/Home Management : 8-22 mins  Khyran Riera , MS, OTR/L, CLT Pager: [phone]  12/31/2014, 9:32 AM

## 2014-12-31 NOTE — Progress Notes (Signed)
VS stable, dressing intact, IV removed with no complications. Report called to Butch Penny, Therapist, sports at [facility]. Patient discharged via medical transport to [facility].

## 2015-01-16 ENCOUNTER — Ambulatory Visit: Payer: Self-pay

## 2015-01-16 LAB — URINALYSIS, COMPLETE
Bilirubin,UR: NEGATIVE
GLUCOSE, UR: NEGATIVE mg/dL (ref 0–75)
KETONE: NEGATIVE
NITRITE: NEGATIVE
PH: 5 (ref 4.5–8.0)
PROTEIN: NEGATIVE
RBC,UR: 3 /HPF (ref 0–5)
SPECIFIC GRAVITY: 1.014 (ref 1.003–1.030)
Squamous Epithelial: <1
WBC UR: 118 /HPF (ref 0–5)

## 2015-01-16 LAB — CLOSTRIDIUM DIFFICILE(ARMC)

## 2015-01-19 ENCOUNTER — Ambulatory Visit: Payer: Federal, State, Local not specified - PPO | Admitting: Physician Assistant

## 2015-01-19 ENCOUNTER — Ambulatory Visit: Payer: Federal, State, Local not specified - PPO | Admitting: Cardiovascular Disease

## 2015-01-19 LAB — URINE CULTURE

## 2015-01-21 ENCOUNTER — Ambulatory Visit (HOSPITAL_COMMUNITY)
Admission: RE | Admit: 2015-01-21 | Discharge: 2015-01-21 | Disposition: A | Discharge status: Home / Self Care | Payer: Federal, State, Local not specified - PPO | Source: Ambulatory Visit | Attending: Cardiovascular Disease | Admitting: Cardiovascular Disease

## 2015-01-21 ENCOUNTER — Encounter: Payer: Self-pay | Admitting: Cardiovascular Disease

## 2015-01-21 ENCOUNTER — Ambulatory Visit (INDEPENDENT_AMBULATORY_CARE_PROVIDER_SITE_OTHER): Payer: Federal, State, Local not specified - PPO | Admitting: Cardiovascular Disease

## 2015-01-21 VITALS — BP 98/46 | HR 60 | Ht 63.0 in | Wt 158.1 lb

## 2015-01-21 DIAGNOSIS — M7989 Other specified soft tissue disorders: Secondary | ICD-10-CM | POA: Diagnosis not present

## 2015-01-21 DIAGNOSIS — I5033 Acute on chronic diastolic (congestive) heart failure: Secondary | ICD-10-CM

## 2015-01-21 DIAGNOSIS — Z9884 Bariatric surgery status: Secondary | ICD-10-CM

## 2015-01-21 DIAGNOSIS — I25119 Atherosclerotic heart disease of native coronary artery with unspecified angina pectoris: Secondary | ICD-10-CM

## 2015-01-21 DIAGNOSIS — M79604 Pain in right leg: Secondary | ICD-10-CM | POA: Insufficient documentation

## 2015-01-21 DIAGNOSIS — D509 Iron deficiency anemia, unspecified: Secondary | ICD-10-CM

## 2015-01-21 DIAGNOSIS — I6523 Occlusion and stenosis of bilateral carotid arteries: Secondary | ICD-10-CM

## 2015-01-21 DIAGNOSIS — M79601 Pain in right arm: Secondary | ICD-10-CM

## 2015-01-21 LAB — IRON AND TIBC
%SAT: 29 % (ref 20–55)
IRON: 77 ug/dL (ref 42–145)
TIBC: 262 ug/dL (ref 250–470)
UIBC: 185 ug/dL (ref 125–400)

## 2015-01-21 LAB — CBC
HCT: 28.8 % — ABNORMAL LOW (ref 36.0–46.0)
Hemoglobin: 9.6 g/dL — ABNORMAL LOW (ref 12.0–15.0)
MCH: 31 pg (ref 26.0–34.0)
MCHC: 33.3 g/dL (ref 30.0–36.0)
MCV: 92.9 fL (ref 78.0–100.0)
MPV: 10.6 fL (ref 8.6–12.4)
Platelets: 188 10*3/uL (ref 150–400)
RBC: 3.1 MIL/uL — AB (ref 3.87–5.11)
RDW: 14.7 % (ref 11.5–15.5)
WBC: 5.8 10*3/uL (ref 4.0–10.5)

## 2015-01-21 NOTE — Patient Instructions (Signed)
Your physician recommends that you return for lab work in: [facility] lab  TAKE Furosemide 80 mg every morning.  START a prenatal vitamin.  Dr. Sallyanne Kuster recommends that you schedule a follow-up appointment in: 4 weeks.

## 2015-01-21 NOTE — Progress Notes (Signed)
Right Lower Extremity Venous Duplex Completed. [place]

## 2015-01-22 LAB — FERRITIN: Ferritin: 153 ng/mL (ref 10–291)

## 2015-01-22 NOTE — Progress Notes (Signed)
Patient ID: [name], female   DOB: Apr 03, 1951, 64 y.o.   MRN: [id number]      Reason for office visit CAD, acute on chronic diastolic heart failure, asymmetrical leg edema  [name] underwent right total hip replacement on January 11 and then was in rehab until a few days ago. After surgery her hemoglobin was around 8 and after 2 units PRBC transfusion increased to 10 (where she usually hovers chronically). She thinks it dropped again to the 8s whilst in rehab. She is using a walker and a cane around the home.  [name] is short of breath at rest and with minimal exertion. She is markedly edematous and her right lower extremity looks twice as swollen as the left. She appears paler than I remember her. She actually weighs 2 lb less than she did in December, but 5 lb more than on November 23, when she was probably euvolemic. Her appetite and oral intake have been very poor and it is likely that she has lost true weight. She has not beentaking her diuretic as prescribed since it is hard to reach the bathroom.  Nuclear perfusion study November 16, 2014 was low risk, EF 56%. Echo October showed diastolic dysfunction and high filling pressure (when weight was around 161 lb).  She underwent coronary artery bypass surgery approximately 21 years ago. She has known chronic total bilateral carotid occlusion - her entire cerebral vascular blood supply is via large vertebral arteries. She has a remote history of diabetes mellitus and hypertension and hyperlipidemia. After gastric bypass surgery and substantial weight loss she has been very well compensated and has not required medications for these disorders in a few years. 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 She has a known history of diastolic heart failure that has usually been well compensated (NYHA functional class I-II).   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    Allergies  Allergen Reactions  . Latex   . Metoclopramide Hcl   . Neurontin [Gabapentin]   . Penicillins     Current Outpatient Prescriptions  Medication Sig Dispense Refill  . acetaminophen (TYLENOL) 500 MG tablet Take 1,500 mg by mouth every 6 (six) hours as needed for mild pain.     Marland Kitchen aspirin EC 325 MG EC tablet Take 1 tablet (325 mg total) by mouth 2 (two) times daily after a meal. Take 1 month post op to decrease risk of blood clots. 60 tablet 0  . calcium carbonate 200 MG capsule Take 250 mg by mouth daily.    . citalopram (CELEXA) 20 MG tablet Take 1 tablet (20 mg total) by mouth daily. (Patient taking differently: Take 40 mg by mouth daily. ) 30 tablet 3  . meloxicam (MOBIC) 7.5 MG tablet Take 1 tablet (7.5 mg total) by mouth daily. 30 tablet 11  . metolazone (ZAROXOLYN) 2.5 MG tablet Take one tablet daily  X 3 days then as needed if weight is greater than 155 lbs. 90 tablet 3  . metoprolol tartrate (LOPRESSOR) 25 MG tablet Take 1 tablet (25 mg total) by mouth 2 (two) times daily. (Patient taking differently: Take 25 mg by mouth as needed (for blood pressure). )    . morphine (AVINZA) 30 MG 24 hr capsule Take  1 capsule (30 mg total) by mouth every 8 (eight) hours. 42 capsule 0  . NEXIUM 40 MG capsule Take 1 capsule by mouth daily.    . nitroGLYCERIN (NITROLINGUAL) 0.4 MG/SPRAY spray USE ONE TO TWO SPRAYS UNDER THE TONGUE EVERY 5 MINUTES AS NEEDED FOR CHEST PAIN 12 g 5  . potassium chloride (K-DUR) 10 MEQ tablet Take 1 tablet (10 mEq total) by mouth daily. 30 tablet 5  . Vitamin D, Ergocalciferol, (DRISDOL) 50000 UNITS CAPS capsule Take 1 capsule (50,000 Units total) by mouth daily. (Patient taking differently: Take 50,000 Units by mouth every 7 (seven)  days. ) 30 capsule 0  . furosemide (LASIX) 80 MG tablet Take 1 tablet (80 mg total) by mouth daily. (Patient not taking: Reported on 01/21/2015) 30 tablet 5   No current facility-administered medications for this visit.    Past Medical History  Diagnosis Date  . Ischemic heart disease   . Diastolic dysfunction   . Chronic anemia   . History of recurrent UTIs   . Peroneal neuropathy   . Retinopathy   . Stroke   . Hypocalcemia   . Morbid obesity     s/p gastric bypass  . Depression   . CAD s/p CABG 1994 09/25/2013    LIMA-LAD, SVG Diag, SVG-RCA, [place] Cath 2010: Stents in the proximal LAD with 40% in-stent restenosis and 50% post-stent stenosis, 80% small OM 1, 70% small OM 2, 40% first PLA; diffuse 70% distal RCA; patent SVG to diagonal, patent SVG to distal RCA, patent atretic LIMA EF 55%; normal nuclear stress test 2011  . Bilateral carotid artery occlusion 09/25/2013  . 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 Allergy   . Anxiety   . Asthma   . Cataract   . CHF (congestive heart failure)   . Diabetes mellitus without complication   . GERD (gastroesophageal reflux disease)   . Glaucoma   . Heart murmur   . Hypertension   . Hyperlipidemia   . Osteoporosis   . Arthritis     Past Surgical History  Procedure Laterality Date  . Ventral hernia repair    . Appendectomy    . Vaginal hysterectomy    . Coronary artery bypass graft  1994    LIM to LAD,SVG to first diagonal,SVG to distal RCA  . Cardiac catheterization  03/30/2009    3 vessel CAD,patent grafts  . Gastric bypass  2002  . Cholecystectomy    . Heel spur excision  2012  . Trigger finger release  2012  . Radiofrequency ablation      AVNRT - successful  . Nm myocar perf wall motion  2011    no ischemia  . US echocardiography  05/09/2010    mild MR,mild mitral and aortic sclerosis  . Small intestine surgery    . Hernia repair    . Total hip arthroplasty Right 12/27/2014    Procedure: TOTAL HIP  ARTHROPLASTY ANTERIOR APPROACH;  Surgeon: Alta Corning, MD;  Location: [facility];  Service: Orthopedics;  Laterality: Right;    Family History  Problem Relation Age of Onset  . Diabetes Brother   . Asthma Brother   . Depression Brother   . Asthma Brother   . Depression Brother   . Diabetes Brother   . Cancer Father     skin  . Alcohol abuse Father   . Arthritis Father   . Depression Father   . Hypertension Father   . Diabetes Mother   .  Heart failure Mother   . Arthritis Mother   . Heart disease Mother   . Asthma Sister   . Heart attack Sister   . Depression Sister   . Diabetes Sister   . Drug abuse Sister   . Heart disease Sister   . Mental illness Sister   . Heart attack Son   . Hypertension Son   . Kidney disease Son     transplant 2003  . Vision loss Son     History   Social History  . Marital Status: Divorced    Spouse Name: N/A    Number of Children: 2  . Years of Education: N/A   Occupational History  . disabled    Social History Main Topics  . Smoking status: Former Smoker    Quit date: 12/18/1987  . Smokeless tobacco: Never Used  . Alcohol Use: No  . Drug Use: No  . Sexual Activity: Not Currently   Other Topics Concern  . Not on file   Social History Narrative   She used to work with the  [employer]. 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    Review of systems: Moderate to severe leg edema, fatigue, NYHA class III-IV dyspnea.  PHYSICAL EXAM BP 98/46 mmHg  Pulse 60  Ht 5\' 3"  (1.6 m)  Wt 71.714 kg (158 lb 1.6 oz)  BMI 28.01 kg/m2 General: Alert, oriented x3, no distress  Head: no evidence of trauma, PERRL, EOMI, no exophtalmos or lid lag, no myxedema, no xanthelasma; normal ears, nose and oropharynx  Neck: 7-8 centimeter elevation in jugular venous pulsations and prompt hepatojugular reflux; brisk carotid pulses without delay and no carotid bruits  Chest: clear to auscultation, no  signs of consolidation by percussion or palpation, normal fremitus, symmetrical and full respiratory excursions, sternotomy scar  Cardiovascular: normal position and quality of the apical impulse, regular rhythm, normal first and second heart sounds, no murmurs, rubs or gallops  Abdomen: no tenderness or distention, no masses by palpation, no abnormal pulsatility or arterial bruits, normal bowel sounds, no hepatosplenomegaly  Extremities: no clubbing, cyanosis ; 3+ bilateral hard pitting edema up to the thigh on the right, 1-2+ edema to the knee on the left , right calf saphenectomy scars; 2+ radial, ulnar and brachial pulses bilaterally; 2+ right femoral, posterior tibial and dorsalis pedis pulses; 2+ left femoral, posterior tibial and dorsalis pedis pulses; no subclavian or femoral bruits  Neurological: grossly nonfocal   EKG: January 14, NSR, Nonspecific ST-T changes  Lipid Panel     Component Value Date/Time   CHOL 51 04/24/2011 2341   TRIG 47 04/24/2011 2341   HDL 20* 04/24/2011 2341   CHOLHDL 2.6 04/24/2011 2341   VLDL 9 04/24/2011 2341   LDLCALC  04/24/2011 2341    22        Total Cholesterol/HDL:CHD Risk Coronary Heart Disease Risk Table                     Men   Women  1/2 Average Risk   3.4   3.3  Average Risk       5.0   4.4  2 X Average Risk   9.6   7.1  3 X Average Risk  23.4   11.0        Use the calculated Patient Ratio above and the CHD Risk Table to determine the patient's CHD Risk.        ATP  III CLASSIFICATION (LDL):  <100     mg/dL   Optimal  100-129  mg/dL   Near or Above                    Optimal  130-159  mg/dL   Borderline  160-189  mg/dL   High  >190     mg/dL   Very High    BMET    Component Value Date/Time   NA 139 12/30/2014 0455   NA 137 09/30/2013 1404   K 3.8 12/30/2014 0455   K 4.1 09/30/2013 1404   CL 99 12/30/2014 0455   CL 107 03/23/2013 1401   CO2 27 12/30/2014 0455   CO2 23 09/30/2013 1404   GLUCOSE 156* 12/30/2014 0455    GLUCOSE 130 09/30/2013 1404   GLUCOSE 163* 03/23/2013 1401   BUN 8 12/30/2014 0455   BUN 9.1 09/30/2013 1404   CREATININE 0.67 12/30/2014 0455   CREATININE 0.58 11/22/2014 1127   CREATININE 0.7 09/30/2013 1404   CALCIUM 7.3* 12/30/2014 0455   CALCIUM 6.5* 09/30/2013 1404   GFRNONAA >90 12/30/2014 0455   GFRNONAA >89 11/22/2014 1127   GFRAA >90 12/30/2014 0455   GFRAA >89 11/22/2014 1127     ASSESSMENT AND PLAN  Asymmetrical leg edema, s/p total hip replacement STAT lower extremity duplex US study in the office today did not show DVT. Previous saphenectomy probably explains asymmetry.  Acute on chronic Diastolic heart failure  Not compliant with diuretics. Previously established a target dry weight of 155 lb, but she may have lost "real weight". I would estimate around 10 lb of current fluid excess. She needs to take her diuretic daily. Probably needs a bedside commode. (Consider constrictive pericarditis - inferior vena cava reported as normal in size on last echo, making this less likely)  CAD s/p CABG 1994 21 years after bypass surgery , low risk recent nuclear perfusion study.  Iron deficiency anemia  She looked really pale, but labs received after her office visit show hemoglobin is 9.6, only mildly lower than her baseline of 10-10.5.   Orders Placed This Encounter  Procedures  . CBC  . Iron  . Iron Binding Cap (TIBC)  . Ferritin  . Lower Extremity Venous Duplex Right   Meds ordered this encounter  Medications  . calcium carbonate 200 MG capsule    Sig: Take 250 mg by mouth daily.    Holli Humbles, MD, [place] [phone] office [phone] pager

## 2015-01-27 ENCOUNTER — Encounter: Payer: Self-pay | Admitting: Physician Assistant

## 2015-01-27 ENCOUNTER — Ambulatory Visit (INDEPENDENT_AMBULATORY_CARE_PROVIDER_SITE_OTHER): Payer: Federal, State, Local not specified - PPO | Admitting: Physician Assistant

## 2015-01-27 VITALS — BP 114/58 | HR 68 | Temp 98.1°F | Resp 18 | Wt 160.0 lb

## 2015-01-27 DIAGNOSIS — H0012 Chalazion right lower eyelid: Secondary | ICD-10-CM

## 2015-01-27 DIAGNOSIS — Z9884 Bariatric surgery status: Secondary | ICD-10-CM

## 2015-01-27 LAB — COMPLETE METABOLIC PANEL WITH GFR
ALK PHOS: 224 U/L — AB (ref 39–117)
ALT: 11 U/L (ref 0–35)
AST: 14 U/L (ref 0–37)
Albumin: 2.8 g/dL — ABNORMAL LOW (ref 3.5–5.2)
BILIRUBIN TOTAL: 0.4 mg/dL (ref 0.2–1.2)
BUN: 15 mg/dL (ref 6–23)
CO2: 19 mEq/L (ref 19–32)
CREATININE: 0.58 mg/dL (ref 0.50–1.10)
Calcium: 7.5 mg/dL — ABNORMAL LOW (ref 8.4–10.5)
Chloride: 112 mEq/L (ref 96–112)
GFR, Est African American: >89 mL/min
GFR, Est Non African American: >89 mL/min
Glucose, Bld: 163 mg/dL — ABNORMAL HIGH (ref 70–99)
Potassium: 4.3 mEq/L (ref 3.5–5.3)
SODIUM: 142 meq/L (ref 135–145)
TOTAL PROTEIN: 5.6 g/dL — AB (ref 6.0–8.3)

## 2015-01-27 MED ORDER — SULFACETAMIDE SODIUM 10 % OP OINT: TOPICAL_OINTMENT | Freq: Four times a day (QID) | OPHTHALMIC | Status: DC

## 2015-01-27 NOTE — Progress Notes (Signed)
Patient ID: MARKEDA NARVAEZ MRN: [id number], DOB: 06/07/1951, 64 y.o. Date of Encounter: @DATE @  Chief Complaint:  Chief Complaint  Patient presents with  . hosp follow up    [place] replace 1/11  just got out of rehab, review meds!!!    HPI: 64 y.o. year old white female  presents for above.   She recently had hospitalization for right hip replacement surgery. She subsequently went to rehabilitation facility.  Reviewed her discharge summary. The one thing they had for me to follow-up was to recheck a calcium level. At time of discharge she was scheduled follow-up with multiple different providers and they were to each follow-up specific items.  She already saw " Dr. Loletha Grayer"  at Cardiology 01/21/15.  One additional thing she wanted to check today was that she thinks she has a stye on her right lower eyelid. She says that she thinks she just developed this while she was at the rehabilitation facility.   Past Medical History  Diagnosis Date  . Ischemic heart disease   . Diastolic dysfunction   . Chronic anemia   . History of recurrent UTIs   . Peroneal neuropathy   . Retinopathy   . Stroke   . Hypocalcemia   . Morbid obesity     s/p gastric bypass  . Depression   . CAD s/p CABG 1994 09/25/2013    LIMA-LAD, SVG Diag, SVG-RCA, [facility] Cath 2010: Stents in the proximal LAD with 40% in-stent restenosis and 50% post-stent stenosis, 80% small OM 1, 70% small OM 2, 40% first PLA; diffuse 70% distal RCA; patent SVG to diagonal, patent SVG to distal RCA, patent atretic LIMA EF 55%; normal nuclear stress test 2011  . Bilateral carotid artery occlusion 09/25/2013  . 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 Allergy   . Anxiety   . Asthma   . Cataract   . CHF (congestive heart failure)   . Diabetes mellitus without complication   . GERD (gastroesophageal reflux disease)   . Glaucoma   . Heart murmur   . Hypertension   . Hyperlipidemia   . Osteoporosis   . Arthritis       Home Meds: Outpatient Prescriptions Prior to Visit  Medication Sig Dispense Refill  . acetaminophen (TYLENOL) 500 MG tablet Take 1,500 mg by mouth every 6 (six) hours as needed for mild pain.     Marland Kitchen aspirin EC 325 MG EC tablet Take 1 tablet (325 mg total) by mouth 2 (two) times daily after a meal. Take 1 month post op to decrease risk of blood clots. 60 tablet 0  . calcium carbonate 200 MG capsule Take 250 mg by mouth daily.    . citalopram (CELEXA) 20 MG tablet Take 1 tablet (20 mg total) by mouth daily. (Patient taking differently: Take 40 mg by mouth daily. ) 30 tablet 3  . furosemide (LASIX) 80 MG tablet Take 1 tablet (80 mg total) by mouth daily. 30 tablet 5  . metolazone (ZAROXOLYN) 2.5 MG tablet Take one tablet daily  X 3 days then as needed if weight is greater than 155 lbs. 90 tablet 3  . morphine (AVINZA) 30 MG 24 hr capsule Take 1 capsule (30 mg total) by mouth every 8 (eight) hours. 42 capsule 0  . NEXIUM 40 MG capsule Take 1 capsule by mouth daily.    . nitroGLYCERIN (NITROLINGUAL) 0.4 MG/SPRAY spray USE ONE TO TWO SPRAYS UNDER THE TONGUE EVERY 5 MINUTES AS NEEDED FOR CHEST PAIN  12 g 5  . potassium chloride (K-DUR) 10 MEQ tablet Take 1 tablet (10 mEq total) by mouth daily. 30 tablet 5  . Vitamin D, Ergocalciferol, (DRISDOL) 50000 UNITS CAPS capsule Take 1 capsule (50,000 Units total) by mouth daily. (Patient taking differently: Take 50,000 Units by mouth every 7 (seven) days. ) 30 capsule 0  . meloxicam (MOBIC) 7.5 MG tablet Take 1 tablet (7.5 mg total) by mouth daily. (Patient not taking: Reported on 01/27/2015) 30 tablet 11  . metoprolol tartrate (LOPRESSOR) 25 MG tablet Take 1 tablet (25 mg total) by mouth 2 (two) times daily. (Patient not taking: Reported on 01/27/2015)     No facility-administered medications prior to visit.    Allergies:  Allergies  Allergen Reactions  . Latex   . Metoclopramide Hcl   . Neurontin [Gabapentin]   . Penicillins     History   Social  History  . Marital Status: Divorced    Spouse Name: N/A  . Number of Children: 2  . Years of Education: N/A   Occupational History  . disabled    Social History Main Topics  . Smoking status: Former Smoker    Quit date: 12/18/1987  . Smokeless tobacco: Never Used  . Alcohol Use: No  . Drug Use: No  . Sexual Activity: Not Currently   Other Topics Concern  . Not on file   Social History Narrative   She used to work with the  [employer]. 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    Family History  Problem Relation Age of Onset  . Diabetes Brother   . Asthma Brother   . Depression Brother   . Asthma Brother   . Depression Brother   . Diabetes Brother   . Cancer Father     skin  . Alcohol abuse Father   . Arthritis Father   . Depression Father   . Hypertension Father   . Diabetes Mother   . Heart failure Mother   . Arthritis Mother   . Heart disease Mother   . Asthma Sister   . Heart attack Sister   . Depression Sister   . Diabetes Sister   . Drug abuse Sister   . Heart disease Sister   . Mental illness Sister   . Heart attack Son   . Hypertension Son   . Kidney disease Son     transplant 2003  . Vision loss Son      Review of Systems:  See HPI for pertinent ROS. All other ROS negative.    Physical Exam: Blood pressure 114/58, pulse 68, temperature 98.1 F (36.7 C), temperature source Oral, resp. rate 18, weight 160 lb (72.576 kg)., Body mass index is 28.35 kg/(m^2). General: WNWD WF. Appears in no acute distress. Head: Normocephalic, atraumatic, eyes without discharge.  Left Eye: Normal Right Eye: Just below Lower Eye Lid--there is papule--it is same color as surrounding skin--very minimal erythema. COnjunctiva normal bilaterally.  Neck: Supple. No thyromegaly. No lymphadenopathy. Lungs: Clear bilaterally to auscultation without wheezes, rales, or rhonchi. Breathing is unlabored. Heart: RRR with S1  S2. No murmurs, rubs, or gallops. Musculoskeletal:  Strength and tone normal for age. Extremities/Skin: Warm and dry.  Neuro: Alert and oriented X 3. Moves all extremities spontaneously.  Psych:  Responds to questions appropriately with a normal affect.     ASSESSMENT AND PLAN:  64 y.o. year old female with  1. H/O gastric bypass - COMPLETE METABOLIC PANEL  WITH GFR - Vit D  25 hydroxy (rtn osteoporosis monitoring)  2. Hypocalcemia - COMPLETE METABOLIC PANEL WITH GFR - Vit D  25 hydroxy (rtn osteoporosis monitoring)  Today patient states that she has an appointment for February 15 at a vessel to the office to that deals with patients with gastric bypass and she is supposed to be meeting with a nutritionist there. Today she had questions about calcium citrate versus other types of calcium and was very confused about this. Reviewed with her the result note attached to her lab results from 11/22/14. I wrote this down on her AVS as well. Wrote down that she needs calcium citrate and needs to make sure that the amount of elemental calcium is 1500-2000 mg per day but to be taken in divided doses.  3. Chalazion of right lower eyelid - sulfacetamide (BLEPH-10) 10 % ophthalmic ointment; Place into the right eye every 6 (six) hours.  Dispense: 3.5 g; Refill: 0 Explain to her that her skin lesion looks different than an acute stye (hordeolum) or chalazion. ---Especially given its white color and the fact that it is not erythematous. However she really says that this just developed while she was at the rehabilitation center.   Told her to apply warm compresses every 4 hours. After applying the warm compress allow the site dry then apply the ointment. If site worsens or does not resolve in one week with me know and she will need follow-up with Ophthalmology for excision.   She is to schedule follow-up visits with me in one month to get her back on routine visits. Follow up sooner if  needed.  [address] [place], [place], [facility] 01/27/2015 1:39 PM

## 2015-01-28 ENCOUNTER — Telehealth: Payer: Self-pay | Admitting: Family Medicine

## 2015-01-28 LAB — VITAMIN D 25 HYDROXY (VIT D DEFICIENCY, FRACTURES): Vit D, 25-Hydroxy: 13 ng/mL — ABNORMAL LOW (ref 30–100)

## 2015-01-28 NOTE — Telephone Encounter (Signed)
-----   Message from Orlena Sheldon, PA-C sent at 01/28/2015  7:36 AM EST ----- I had addressed her Calcium and D in recent past, but then she went to hospital etc and I think she forgot everything we had told her in past.  Order Rx Vitamin D--50,000 IU Q Week x 12 weeks. Tell her to take this. Tell her to also take Calcium--as directed yesterday--I wrote it down for her yesterday---If she has lost that--if she needs repeat instruction--I hve a long paragraph of instruction on Result Note attached to prior labs--I think it was in [place] that if needed.

## 2015-01-28 NOTE — Telephone Encounter (Signed)
Pt aware of lab results and provider recommendations.  Already taking Vit D 50,000 IU weekly.  Reminded her to continue to take weekly for at least 12 more weeks.  Told her to get her Calcium as directed and take that daily.  Stressed how low her Vit d level was and the importance of taking the supplements as directed

## 2015-02-17 ENCOUNTER — Other Ambulatory Visit (HOSPITAL_COMMUNITY): Payer: Self-pay | Admitting: Orthopedic Surgery

## 2015-02-17 DIAGNOSIS — I82401 Acute embolism and thrombosis of unspecified deep veins of right lower extremity: Secondary | ICD-10-CM

## 2015-02-18 ENCOUNTER — Ambulatory Visit (INDEPENDENT_AMBULATORY_CARE_PROVIDER_SITE_OTHER): Payer: Federal, State, Local not specified - PPO | Admitting: Cardiovascular Disease

## 2015-02-18 ENCOUNTER — Ambulatory Visit (HOSPITAL_COMMUNITY)
Admission: RE | Admit: 2015-02-18 | Discharge: 2015-02-18 | Disposition: A | Discharge status: Home / Self Care | Payer: Federal, State, Local not specified - PPO | Source: Ambulatory Visit | Attending: Cardiovascular Disease | Admitting: Cardiovascular Disease

## 2015-02-18 ENCOUNTER — Encounter: Payer: Self-pay | Admitting: Cardiovascular Disease

## 2015-02-18 VITALS — BP 110/50 | HR 50 | Resp 16 | Ht 62.5 in | Wt 152.0 lb

## 2015-02-18 DIAGNOSIS — I82401 Acute embolism and thrombosis of unspecified deep veins of right lower extremity: Secondary | ICD-10-CM

## 2015-02-18 DIAGNOSIS — I5033 Acute on chronic diastolic (congestive) heart failure: Secondary | ICD-10-CM

## 2015-02-18 DIAGNOSIS — I25119 Atherosclerotic heart disease of native coronary artery with unspecified angina pectoris: Secondary | ICD-10-CM

## 2015-02-18 DIAGNOSIS — M7989 Other specified soft tissue disorders: Secondary | ICD-10-CM

## 2015-02-18 DIAGNOSIS — Z9884 Bariatric surgery status: Secondary | ICD-10-CM

## 2015-02-18 DIAGNOSIS — E43 Unspecified severe protein-calorie malnutrition: Secondary | ICD-10-CM

## 2015-02-18 DIAGNOSIS — R600 Localized edema: Secondary | ICD-10-CM

## 2015-02-18 DIAGNOSIS — D509 Iron deficiency anemia, unspecified: Secondary | ICD-10-CM

## 2015-02-18 DIAGNOSIS — M79661 Pain in right lower leg: Secondary | ICD-10-CM

## 2015-02-18 DIAGNOSIS — I5032 Chronic diastolic (congestive) heart failure: Secondary | ICD-10-CM

## 2015-02-18 NOTE — Patient Instructions (Signed)
Dr. Croitoru recommends that you schedule a follow-up appointment in: 6 months    

## 2015-02-18 NOTE — Progress Notes (Signed)
Right Lower Ext. Venous Duplex Completed. Negative for DVT or SVT. Oda Cogan, BS, RDMS, RVT

## 2015-02-20 ENCOUNTER — Encounter: Payer: Self-pay | Admitting: Cardiovascular Disease

## 2015-02-20 DIAGNOSIS — R600 Localized edema: Secondary | ICD-10-CM | POA: Insufficient documentation

## 2015-02-20 NOTE — Progress Notes (Signed)
Patient ID: [name], female   DOB: 25-Feb-1951, 64 y.o.   MRN: [id number]     Cardiology Office Note   Date:  02/20/2015   ID:  [name], DOB 1951-03-10, MRN [id number]  PCP:  Karis Juba, PA-C  Cardiologist:   Sanda Klein, MD   Chief Complaint  Patient presents with  . Follow-up    one month:  No complaints of chest pain or SOB.  Bilateral ankle edema.  Occas. lightheadedness when getting out of bed.  Scheduled for a doppler right leg under knee cap - ordered by Dr. Berenice Primas.      History of Present Illness: [name] is a 64 y.o. female who presents for diastolic heart failure, persistent asymmetrical leg edema, CAD s/p CABG and severe ASCVD (bilateral carotid artery occlusion).  After hip replacement surgery in January, she has had persistent asymmetrical swelling and discomfort behind her right knee. We scanned her leg a month ago and there was no DVT. Repeat scan was ordered by Dr. Berenice Primas and is still negative today.  She has lost 6 lb after enhanced diuretic therapy and her dyspnea is resolved. Edema persists.  She is troubled mostly by persistent watery diarrhea (8-10 x/day). 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  She underwent coronary artery bypass surgery approximately 21 years ago. She has known chronic total bilateral carotid occlusion - her entire cerebral vascular blood supply is via large vertebral arteries. She has a remote history of diabetes mellitus and hypertension and hyperlipidemia. After gastric bypass surgery and substantial weight loss she has been very well compensated and has not required medications for these disorders in a few years. 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 She has a known history of diastolic heart failure that has usually been well compensated (NYHA functional class I-II). Nuclear perfusion study November 16, 2014 was low risk, EF 56%.  Echo October showed diastolic dysfunction and high filling pressure (when weight was around 161 lb).  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  Past Medical History  Diagnosis Date  . Ischemic heart disease   . Diastolic dysfunction   . Chronic anemia   . History of recurrent UTIs   . Peroneal neuropathy   . Retinopathy   . Stroke   . Hypocalcemia   . Morbid obesity     s/p gastric bypass  . Depression   . CAD s/p CABG 1994 09/25/2013    LIMA-LAD, SVG Diag, SVG-RCA, [facility] Cath 2010: Stents in the proximal LAD with 40% in-stent restenosis and 50% post-stent stenosis, 80% small OM 1, 70% small OM 2, 40% first PLA; diffuse 70% distal RCA; patent SVG to diagonal, patent SVG to distal RCA, patent atretic LIMA EF 55%; normal nuclear stress test 2011  . Bilateral carotid artery occlusion 09/25/2013  . 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 Allergy   . Anxiety   . Asthma   . Cataract   . CHF (congestive heart failure)   . Diabetes mellitus without complication   . GERD (gastroesophageal reflux disease)   . Glaucoma   . Heart murmur   . Hypertension   . Hyperlipidemia   . Osteoporosis   . Arthritis     Past Surgical History  Procedure Laterality Date  . Ventral hernia repair    . Appendectomy    . Vaginal hysterectomy    . Coronary artery bypass graft  1994    LIM to LAD,SVG to first diagonal,SVG to distal RCA  . Cardiac catheterization  03/30/2009    3 vessel CAD,patent grafts  . Gastric bypass  2002  . Cholecystectomy    . Heel spur excision  2012  . Trigger finger release  2012  . Radiofrequency ablation      AVNRT - successful  . Nm myocar perf wall motion  2011    no ischemia  . US echocardiography   05/09/2010    mild MR,mild mitral and aortic sclerosis  . Small intestine surgery    . Hernia repair    . Total hip arthroplasty Right 12/27/2014    Procedure: TOTAL HIP ARTHROPLASTY ANTERIOR APPROACH;  Surgeon: Alta Corning, MD;  Location: [place];  Service: Orthopedics;  Laterality: Right;     Current Outpatient Prescriptions  Medication Sig Dispense Refill  . acetaminophen (TYLENOL) 500 MG tablet Take 1,500 mg by mouth every 6 (six) hours as needed for mild pain.     Marland Kitchen aspirin EC 325 MG EC tablet Take 1 tablet (325 mg total) by mouth 2 (two) times daily after a meal. Take 1 month post op to decrease risk of blood clots. 60 tablet 0  . calcium carbonate 200 MG capsule Take 250 mg by mouth daily.    . cephALEXin (KEFLEX) 500 MG capsule Take 1 capsule by mouth 2 (two) times daily.    . citalopram (CELEXA) 20 MG tablet Take 1 tablet (20 mg total) by mouth daily. (Patient taking differently: Take 40 mg by mouth daily. ) 30 tablet 3  . ferrous sulfate 325 (65 FE) MG tablet Take 325 mg by mouth daily with breakfast.    . furosemide (LASIX) 80 MG tablet Take 1 tablet (80 mg total) by mouth daily. 30 tablet 5  . loperamide (IMODIUM A-D) 2 MG tablet Take 2 mg by mouth as needed for diarrhea or loose stools.    . meloxicam (MOBIC) 7.5 MG tablet Take 1 tablet (7.5 mg total) by mouth daily. 30 tablet 11  . methocarbamol (ROBAXIN) 500 MG tablet Take 1 tablet by mouth as needed.    . metolazone (ZAROXOLYN) 2.5 MG tablet Take one tablet daily  X 3 days then as needed if weight is greater than 155 lbs. 90 tablet 3  . metoprolol tartrate (LOPRESSOR) 25 MG tablet Take 1 tablet (25 mg total) by mouth 2 (two) times daily.    Marland Kitchen morphine (AVINZA) 30 MG 24 hr capsule Take 1 capsule (30 mg total) by mouth every 8 (eight) hours. 42 capsule 0  . NEXIUM 40 MG capsule Take 1 capsule by mouth daily.    . nitroGLYCERIN (NITROLINGUAL) 0.4 MG/SPRAY spray USE ONE TO TWO SPRAYS UNDER THE TONGUE EVERY 5 MINUTES AS NEEDED FOR  CHEST PAIN 12 g 5  . potassium chloride (K-DUR) 10 MEQ tablet Take 1 tablet (10 mEq total) by mouth daily. 30 tablet 5  . sulfacetamide (BLEPH-10) 10 % ophthalmic ointment Place into the right eye every 6 (six) hours. 3.5 g 0  . Vitamin D, Ergocalciferol, (DRISDOL) 50000 UNITS CAPS capsule Take 1 capsule (50,000 Units total) by mouth daily. (Patient taking differently: Take 50,000 Units by mouth every 7 (seven) days. ) 30 capsule 0   No current facility-administered medications for this visit.    Allergies:  Latex; Metoclopramide hcl; Neurontin; and Penicillins    Social History:  The patient  reports that she quit smoking about 27 years ago. She has never used smokeless tobacco. She reports that she does not drink alcohol or use illicit drugs.   Family History:  The patient's family history includes Alcohol abuse in her father; Arthritis in her father and mother; Asthma in her brother, brother, and sister; Cancer in her father; Depression in her brother, brother, father, and sister; Diabetes in her brother, brother, mother, and sister; Drug abuse in her sister; Heart attack in her sister and son; Heart disease in her mother and sister; Heart failure in her mother; Hypertension in her father and son; Kidney disease in her son; Mental illness in her sister; Vision loss in her son.    ROS:  Please see the history of present illness.   Otherwise, review of systems are positive for none.   All other systems are reviewed and negative.    PHYSICAL EXAM: VS:  BP 110/50 mmHg  Pulse 50  Resp 16  Ht 5' 2.5" (1.588 m)  Wt 152 lb (68.947 kg)  BMI 27.34 kg/m2 , BMI Body mass index is 27.34 kg/(m^2). General: Alert, oriented x3, no distress  Head: no evidence of trauma, PERRL, EOMI, no exophtalmos or lid lag, no myxedema, no xanthelasma; normal ears, nose and oropharynx  Neck: 7-8 centimeter elevation in jugular venous pulsations and prompt hepatojugular reflux; brisk carotid pulses without delay  and no carotid bruits  Chest: clear to auscultation, no signs of consolidation by percussion or palpation, normal fremitus, symmetrical and full respiratory excursions, sternotomy scar  Cardiovascular: normal position and quality of the apical impulse, regular rhythm, normal first and second heart sounds, no murmurs, rubs or gallops  Abdomen: no tenderness or distention, no masses by palpation, no abnormal pulsatility or arterial bruits, normal bowel sounds, no hepatosplenomegaly  Extremities: no clubbing, cyanosis ; 3+ bilateral hard pitting edema up to the thigh on the right, 1-2+ edema to the knee on the left , right calf saphenectomy scars; 2+ radial, ulnar and brachial pulses bilaterally; 2+ right femoral, posterior tibial and dorsalis pedis pulses; 2+ left femoral, posterior tibial and dorsalis pedis pulses; no subclavian or femoral bruits  Neurological: grossly nonfocal    EKG:  EKG is not ordered today.   Recent Labs: 12/30/2014: Magnesium 1.6; TSH 0.789 01/21/2015: Hemoglobin 9.6*; Platelets 188 01/27/2015: ALT 11; BUN 15; Creatinine 0.58; Potassium 4.3; Sodium 142    Lipid Panel    Component Value Date/Time   CHOL 51 04/24/2011 2341   TRIG 47 04/24/2011 2341   HDL 20* 04/24/2011 2341   CHOLHDL 2.6 04/24/2011 2341   VLDL 9 04/24/2011 2341   LDLCALC  04/24/2011 2341    22        Total Cholesterol/HDL:CHD Risk Coronary Heart Disease Risk Table                     Men   Women  1/2 Average Risk   3.4   3.3  Average Risk       5.0   4.4  2 X Average Risk   9.6   7.1  3 X Average Risk  23.4   11.0        Use the calculated Patient Ratio above and the CHD Risk Table to determine the patient's CHD Risk.        ATP III CLASSIFICATION (LDL):  <100     mg/dL  Optimal  100-129  mg/dL   Near or Above                    Optimal  130-159  mg/dL   Borderline  160-189  mg/dL   High  >190     mg/dL   Very High      Wt Readings from Last 3 Encounters:  02/18/15 152 lb  (68.947 kg)  01/27/15 160 lb (72.576 kg)  01/21/15 158 lb 1.6 oz (71.714 kg)      Other studies Reviewed: Additional studies/ records that were reviewed today include: Duplex venous study today. Review of the above records demonstrates: no DVT   ASSESSMENT AND PLAN:  Asymmetrical leg edema, s/p total hip replacement Two separate lower extremity duplex US study a month apart did not show DVT. Previous saphenectomy probably explains asymmetry.  Acute on chronic Diastolic heart failure  Improved after increasing diuretics. Partly, the albumin is probably due to hypoalbuminemia (albumin 2.8). Really uncertain about her current dry weight. Fluid loss due to diarrhea also complicates evaluation. (Consider constrictive pericarditis - inferior vena cava reported as normal in size on last echo, making this less likely)  CAD s/p CABG 1994 21 years after bypass surgery , low risk recent nuclear perfusion study.  Iron deficiency anemia  Labs show hemoglobin is 9.6, only mildly lower than her baseline of 10-10.5. Transferrin saturation 29%, Ferritin is high   Current medicines are reviewed at length with the patient today.  The patient does not have concerns regarding medicines.  The following changes have been made:  no change  Labs/ tests ordered today include: none  Patient Instructions  Dr. Sallyanne Kuster recommends that you schedule a follow-up appointment in: 6 months.      Mikael Spray, MD  02/20/2015 1:41 PM    [facility] [place], [place], [place]  [address] Phone: [phone]; Fax: [phone]

## 2015-02-28 ENCOUNTER — Ambulatory Visit (INDEPENDENT_AMBULATORY_CARE_PROVIDER_SITE_OTHER): Payer: Federal, State, Local not specified - PPO | Admitting: Physician Assistant

## 2015-02-28 ENCOUNTER — Encounter: Payer: Self-pay | Admitting: Physician Assistant

## 2015-02-28 VITALS — BP 118/60 | HR 76 | Temp 98.7°F | Resp 18 | Wt 148.0 lb

## 2015-02-28 DIAGNOSIS — I6523 Occlusion and stenosis of bilateral carotid arteries: Secondary | ICD-10-CM

## 2015-02-28 DIAGNOSIS — E43 Unspecified severe protein-calorie malnutrition: Secondary | ICD-10-CM | POA: Diagnosis not present

## 2015-02-28 DIAGNOSIS — F329 Major depressive disorder, single episode, unspecified: Secondary | ICD-10-CM | POA: Diagnosis not present

## 2015-02-28 DIAGNOSIS — I25119 Atherosclerotic heart disease of native coronary artery with unspecified angina pectoris: Secondary | ICD-10-CM | POA: Diagnosis not present

## 2015-02-28 DIAGNOSIS — I471 Supraventricular tachycardia: Secondary | ICD-10-CM

## 2015-02-28 DIAGNOSIS — F32A Depression, unspecified: Secondary | ICD-10-CM

## 2015-02-28 DIAGNOSIS — D509 Iron deficiency anemia, unspecified: Secondary | ICD-10-CM | POA: Diagnosis not present

## 2015-02-28 DIAGNOSIS — Z9884 Bariatric surgery status: Secondary | ICD-10-CM

## 2015-02-28 MED ORDER — FUROSEMIDE 80 MG PO TABS: 80.0000 mg | ORAL_TABLET | Freq: Every day | ORAL | Status: DC

## 2015-02-28 MED ORDER — VITAMIN D (ERGOCALCIFEROL) 1.25 MG (50000 UNIT) PO CAPS: 50000.0000 [IU] | ORAL_CAPSULE | ORAL | Status: DC

## 2015-02-28 MED ORDER — POTASSIUM CHLORIDE ER 10 MEQ PO TBCR: 10.0000 meq | EXTENDED_RELEASE_TABLET | Freq: Every day | ORAL | Status: DC

## 2015-02-28 MED ORDER — CITALOPRAM HYDROBROMIDE 40 MG PO TABS: 40.0000 mg | ORAL_TABLET | Freq: Every day | ORAL | Status: DC

## 2015-02-28 NOTE — Progress Notes (Signed)
Patient ID: [name] MRN: [id number], DOB: [birth year], 64 y.o. Date of Encounter: @DATE @  Chief Complaint:  Chief Complaint  Patient presents with  . 1 mth follow up    need refill K and lasix, celexa 40 mg    HPI: 64 y.o. year old white female  presents for office visit for routine f//u.   She saw me for preop evaluation 11/22/2014.  He subsequently underwent right hip surgery by Dr. Dorna Leitz  She had postop/post hospital follow-up visit with me 01/27/15.  At that time we obtained follow-up lab work. At that visit she was here with her brother. At that visit I have reviewed the way we head stated for her to take calcium back at the time of her labs 11/22/14. Today she states that she is taking the calcium as directed at the time of those labs and as directed at the last visit. Today she also states that she is taking the vitamin D 50,000 units weekly as directed after the last set of labs 01/27/15.  He also reports that she is seeing nutritionist once a month. They have her taking a multivitamin daily. She is keeping a Museum/gallery conservator. She is increasing protein. They've her samples of protein drinks there and she has found those exams and she is using one of those per day. So making sure to have protein with her other meals as well. His they discussed decreasing fried foods but says that she already was not eating much fried food.  She has continued to follow-up with "Dr. Loletha Grayer" her cardiologist routinely.       HER INITIAL OFFICE VISIT WITH ME WAS 10/18/2104. THE FOLLOWING IS COPIED FROM THAT OFFICE NOTE:   Pt says that her son, [name], was a pt of mine.  Unfortunately, he passed away over this past year. He had a history of diabetes, renal transplant. I had gotten the death certificate so I knew about his death. Patient says that she did have an autopsy performed and that he did die secondary to his heart. Patient states that she is here to establish primary care  with me because she felt that take good care of her son. She says that her current PCP has been Dr. Antony Blackbird at [facility].   She also sees "Doctor C" at [facility].   She says that the only medication that Dr. Chapman Fitch was prescribing is her Celexa. Says that she has been on this about 5 years and that it was started because of depression. Says that the Celexa is working well. Controls her depression and it causes no adverse effects.  Past Medical History  Diagnosis Date  . Ischemic heart disease   . Diastolic dysfunction   . Chronic anemia   . History of recurrent UTIs   . Peroneal neuropathy   . Retinopathy   . Stroke   . Hypocalcemia   . Morbid obesity     s/p gastric bypass  . Depression   . CAD s/p CABG 1994 09/25/2013    LIMA-LAD, SVG Diag, SVG-RCA, [facility] Cath 2010: Stents in the proximal LAD with 40% in-stent restenosis and 50% post-stent stenosis, 80% small OM 1, 70% small OM 2, 40% first PLA; diffuse 70% distal RCA; patent SVG to diagonal, patent SVG to distal RCA, patent atretic LIMA EF 55%; normal nuclear stress test 2011  . Bilateral carotid artery occlusion 09/25/2013  . 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 Allergy   .  Anxiety   . Asthma   . Cataract   . CHF (congestive heart failure)   . Diabetes mellitus without complication   . GERD (gastroesophageal reflux disease)   . Glaucoma   . Heart murmur   . Hypertension   . Hyperlipidemia   . Osteoporosis   . Arthritis      Home Meds: Outpatient Prescriptions Prior to Visit  Medication Sig Dispense Refill  . acetaminophen (TYLENOL) 500 MG tablet Take 1,500 mg by mouth every 6 (six) hours as needed for mild pain.     Marland Kitchen aspirin EC 325 MG EC tablet Take 1 tablet (325 mg total) by mouth 2 (two) times daily after a meal. Take 1 month post op to decrease risk of blood clots. 60 tablet 0  . calcium carbonate 200 MG capsule Take 250 mg by mouth 2 (two) times daily with a meal.     . ferrous sulfate  325 (65 FE) MG tablet Take 325 mg by mouth 2 (two) times daily with a meal.     . loperamide (IMODIUM A-D) 2 MG tablet Take 2 mg by mouth as needed for diarrhea or loose stools.    . methocarbamol (ROBAXIN) 500 MG tablet Take 1 tablet by mouth as needed.    . metolazone (ZAROXOLYN) 2.5 MG tablet Take one tablet daily  X 3 days then as needed if weight is greater than 155 lbs. 90 tablet 3  . metoprolol tartrate (LOPRESSOR) 25 MG tablet Take 1 tablet (25 mg total) by mouth 2 (two) times daily.    Marland Kitchen morphine (AVINZA) 30 MG 24 hr capsule Take 1 capsule (30 mg total) by mouth every 8 (eight) hours. 42 capsule 0  . NEXIUM 40 MG capsule Take 1 capsule by mouth daily.    . nitroGLYCERIN (NITROLINGUAL) 0.4 MG/SPRAY spray USE ONE TO TWO SPRAYS UNDER THE TONGUE EVERY 5 MINUTES AS NEEDED FOR CHEST PAIN 12 g 5  . citalopram (CELEXA) 20 MG tablet Take 1 tablet (20 mg total) by mouth daily. (Patient taking differently: Take 40 mg by mouth daily. ) 30 tablet 3  . furosemide (LASIX) 80 MG tablet Take 1 tablet (80 mg total) by mouth daily. 30 tablet 5  . potassium chloride (K-DUR) 10 MEQ tablet Take 1 tablet (10 mEq total) by mouth daily. 30 tablet 5  . Vitamin D, Ergocalciferol, (DRISDOL) 50000 UNITS CAPS capsule Take 1 capsule (50,000 Units total) by mouth daily. (Patient taking differently: Take 50,000 Units by mouth every 7 (seven) days. ) 30 capsule 0  . meloxicam (MOBIC) 7.5 MG tablet Take 1 tablet (7.5 mg total) by mouth daily. (Patient not taking: Reported on 02/28/2015) 30 tablet 11  . cephALEXin (KEFLEX) 500 MG capsule Take 1 capsule by mouth 2 (two) times daily.    Marland Kitchen sulfacetamide (BLEPH-10) 10 % ophthalmic ointment Place into the right eye every 6 (six) hours. 3.5 g 0   No facility-administered medications prior to visit.    Allergies:  Allergies  Allergen Reactions  . Latex   . Metoclopramide Hcl   . Neurontin [Gabapentin]   . Penicillins     History   Social History  . Marital Status:  Divorced    Spouse Name: N/A  . Number of Children: 2  . Years of Education: N/A   Occupational History  . disabled    Social History Main Topics  . Smoking status: Former Smoker    Quit date: 12/18/1987  . Smokeless tobacco: Never Used  . Alcohol  Use: No  . Drug Use: No  . Sexual Activity: Not Currently   Other Topics Concern  . Not on file   Social History Narrative   She used to work with the  [employer]. 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  Family History  Problem Relation Age of Onset  . Diabetes Brother   . Asthma Brother   . Depression Brother   . Asthma Brother   . Depression Brother   . Diabetes Brother   . Cancer Father     skin  . Alcohol abuse Father   . Arthritis Father   . Depression Father   . Hypertension Father   . Diabetes Mother   . Heart failure Mother   . Arthritis Mother   . Heart disease Mother   . Asthma Sister   . Heart attack Sister   . Depression Sister   . Diabetes Sister   . Drug abuse Sister   . Heart disease Sister   . Mental illness Sister   . Heart attack Son   . Hypertension Son   . Kidney disease Son     transplant 2003  . Vision loss Son      Review of Systems:  See HPI for pertinent ROS. All other ROS negative.    Physical Exam: Blood pressure 118/60, pulse 76, temperature 98.7 F (37.1 C), temperature source Oral, resp. rate 18, weight 148 lb (67.132 kg)., Body mass index is 26.62 kg/(m^2). General: WNWD WF. Appears in no acute distress. Neck: Supple. No thyromegaly. No lymphadenopathy. No carotid bruits.  Lungs: Clear bilaterally to auscultation without wheezes, rales, or rhonchi. Breathing is unlabored. Heart: RRR with S1 S2. No murmurs, rubs, or gallops. Abdomen: Soft, non-tender, non-distended with normoactive bowel sounds. No hepatomegaly. No rebound/guarding. No  obvious abdominal masses. Musculoskeletal:  Strength and tone normal for age. Extremities/Skin:  Right lower extremity larger than left--ever since surgery---has had doppler twice--both negative for DVT.  Neuro: Alert and oriented X 3. Moves all extremities spontaneously. Gait is normal. CNII-XII grossly in tact. Psych:  Responds to questions appropriately with a normal affect.     ASSESSMENT AND PLAN:  64 y.o. year old female with  1. Iron deficiency anemia  2. Coronary artery disease involving native coronary artery of native heart with angina pectoris  3. Bilateral carotid artery occlusion  4. AVNRT s/p successful RF ablation  5. H/O gastric bypass  6. Hypocalcemia - Vitamin D, Ergocalciferol, (DRISDOL) 50000 UNITS CAPS capsule; Take 1 capsule (50,000 Units total) by mouth every 7 (seven) days.  Dispense: 30 capsule; Refill: 0  7. Protein-calorie malnutrition, severe  8. Depression - citalopram (CELEXA) 40 MG tablet; Take 1 tablet (40 mg total) by mouth daily.  Dispense: 30 tablet; Refill: 3  She was taking 2 of the Celexa 20. Told her that I would change this to one of the Celexa 40. She voices understanding and realizes that she needs to decrease from taking 2 per day to one when she gets the new bottle with the new dose.  No changes in medications right now. Also can wait to recheck labs. Continue vitamin D and calcium as directed. Recheck these levels at next office visit in 3 months.  Marin Olp Butler, [place], [facility] 02/28/2015 3:19 PM

## 2015-03-01 ENCOUNTER — Telehealth (HOSPITAL_COMMUNITY): Payer: Self-pay | Admitting: *Deleted

## 2015-03-01 ENCOUNTER — Telehealth: Payer: Self-pay | Admitting: Family Medicine

## 2015-03-01 DIAGNOSIS — F32A Depression, unspecified: Secondary | ICD-10-CM

## 2015-03-01 DIAGNOSIS — F329 Major depressive disorder, single episode, unspecified: Secondary | ICD-10-CM

## 2015-03-01 MED ORDER — CITALOPRAM HYDROBROMIDE 40 MG PO TABS: 40.0000 mg | ORAL_TABLET | Freq: Every day | ORAL | Status: DC

## 2015-03-01 NOTE — Telephone Encounter (Signed)
Medication refilled per protocol. 

## 2015-03-03 ENCOUNTER — Ambulatory Visit: Payer: Federal, State, Local not specified - PPO | Admitting: Cardiovascular Disease

## 2015-06-06 ENCOUNTER — Ambulatory Visit (INDEPENDENT_AMBULATORY_CARE_PROVIDER_SITE_OTHER): Payer: Federal, State, Local not specified - PPO | Admitting: Physician Assistant

## 2015-06-06 ENCOUNTER — Encounter: Payer: Self-pay | Admitting: Physician Assistant

## 2015-06-06 VITALS — BP 118/76 | HR 89 | Temp 99.0°F | Resp 18 | Wt 150.0 lb

## 2015-06-06 DIAGNOSIS — R197 Diarrhea, unspecified: Secondary | ICD-10-CM | POA: Diagnosis not present

## 2015-06-06 DIAGNOSIS — I25119 Atherosclerotic heart disease of native coronary artery with unspecified angina pectoris: Secondary | ICD-10-CM

## 2015-06-06 DIAGNOSIS — I6523 Occlusion and stenosis of bilateral carotid arteries: Secondary | ICD-10-CM

## 2015-06-06 DIAGNOSIS — M1611 Unilateral primary osteoarthritis, right hip: Secondary | ICD-10-CM | POA: Diagnosis not present

## 2015-06-06 DIAGNOSIS — J988 Other specified respiratory disorders: Secondary | ICD-10-CM

## 2015-06-06 DIAGNOSIS — D509 Iron deficiency anemia, unspecified: Secondary | ICD-10-CM | POA: Diagnosis not present

## 2015-06-06 DIAGNOSIS — Z9884 Bariatric surgery status: Secondary | ICD-10-CM | POA: Diagnosis not present

## 2015-06-06 DIAGNOSIS — R112 Nausea with vomiting, unspecified: Secondary | ICD-10-CM | POA: Diagnosis not present

## 2015-06-06 DIAGNOSIS — F32A Depression, unspecified: Secondary | ICD-10-CM

## 2015-06-06 DIAGNOSIS — I471 Supraventricular tachycardia: Secondary | ICD-10-CM

## 2015-06-06 DIAGNOSIS — E43 Unspecified severe protein-calorie malnutrition: Secondary | ICD-10-CM

## 2015-06-06 DIAGNOSIS — B9689 Other specified bacterial agents as the cause of diseases classified elsewhere: Secondary | ICD-10-CM

## 2015-06-06 DIAGNOSIS — F329 Major depressive disorder, single episode, unspecified: Secondary | ICD-10-CM

## 2015-06-06 MED ORDER — AZITHROMYCIN 250 MG PO TABS: ORAL_TABLET | ORAL | Status: DC

## 2015-06-06 MED ORDER — PREDNISONE 20 MG PO TABS: ORAL_TABLET | ORAL | Status: DC

## 2015-06-06 MED ORDER — PROMETHAZINE HCL 25 MG PO TABS: 25.0000 mg | ORAL_TABLET | Freq: Three times a day (TID) | ORAL | Status: DC | PRN

## 2015-06-07 LAB — CBC WITH DIFFERENTIAL/PLATELET
Basophils Absolute: 0 10*3/uL (ref 0.0–0.1)
Basophils Relative: 0 % (ref 0–1)
EOS PCT: 0 % (ref 0–5)
Eosinophils Absolute: 0 10*3/uL (ref 0.0–0.7)
HCT: 36.3 % (ref 36.0–46.0)
HEMOGLOBIN: 11.5 g/dL — AB (ref 12.0–15.0)
LYMPHS PCT: 28 % (ref 12–46)
Lymphs Abs: 1.8 10*3/uL (ref 0.7–4.0)
MCH: 29.3 pg (ref 26.0–34.0)
MCHC: 31.7 g/dL (ref 30.0–36.0)
MCV: 92.4 fL (ref 78.0–100.0)
MONO ABS: 0.4 10*3/uL (ref 0.1–1.0)
MONOS PCT: 7 % (ref 3–12)
MPV: 11.3 fL (ref 8.6–12.4)
NEUTROS PCT: 65 % (ref 43–77)
Neutro Abs: 4.2 10*3/uL (ref 1.7–7.7)
PLATELETS: 178 10*3/uL (ref 150–400)
RBC: 3.93 MIL/uL (ref 3.87–5.11)
RDW: 14.3 % (ref 11.5–15.5)
WBC: 6.4 10*3/uL (ref 4.0–10.5)

## 2015-06-07 LAB — COMPLETE METABOLIC PANEL WITH GFR
ALT: 17 U/L (ref 0–35)
AST: 17 U/L (ref 0–37)
Albumin: 3.9 g/dL (ref 3.5–5.2)
Alkaline Phosphatase: 302 U/L — ABNORMAL HIGH (ref 39–117)
BUN: 9 mg/dL (ref 6–23)
CHLORIDE: 106 meq/L (ref 96–112)
CO2: 23 mEq/L (ref 19–32)
CREATININE: 0.58 mg/dL (ref 0.50–1.10)
Calcium: 8 mg/dL — ABNORMAL LOW (ref 8.4–10.5)
Glucose, Bld: 285 mg/dL — ABNORMAL HIGH (ref 70–99)
POTASSIUM: 4.5 meq/L (ref 3.5–5.3)
Sodium: 138 mEq/L (ref 135–145)
Total Bilirubin: 0.8 mg/dL (ref 0.2–1.2)
Total Protein: 6.5 g/dL (ref 6.0–8.3)

## 2015-06-07 NOTE — Progress Notes (Signed)
Patient ID: [name] MRN: [id number], DOB: Oct 30, 1951, 64 y.o. Date of Encounter: @DATE @  Chief Complaint:  Chief Complaint  Patient presents with  . 3 mos follow up    has symptoms of vomiting and diarrhea  . Cough    congestion    HPI: 64 y.o. year old white female  presents with above symptoms.   She states that she has had diarrhea "all my life ". However says that it has been much worse the past 4 days. Says that sometimes it is "just liquid"   "and comes out with no control ". Is having about 7 or 8 episodes per day the past 4 days. Says that sometimes she feels no pressure or sensation and it "just comes out ". Says that she has had this happen in the past. Had colonoscopy and evaluation with GI but they never could determine a cause and she has no idea what would have caused this current flare. Says that her diet had stayed the same and she has no idea why these flares/episodes occur. Says that she has also been having vomiting the past 4 days.  Also has been having chest congestion and cough the past 4 days. Has had runny nose and says that her nose feels raw from wiping her nose so much. Later, on exam, I noted that she had wheezing and asked if she had had problems with this in the past and she says that she "has had asthma that only flares when she gets a cold ". I reviewed that I did not recall seeing her for episodes of bronchitic type problems in the past and when I reviewed Chart Review-- did not see this either.   Past Medical History  Diagnosis Date  . Ischemic heart disease   . Diastolic dysfunction   . Chronic anemia   . History of recurrent UTIs   . Peroneal neuropathy   . Retinopathy   . Stroke   . Hypocalcemia   . Morbid obesity     s/p gastric bypass  . Depression   . CAD s/p CABG 1994 09/25/2013    LIMA-LAD, SVG Diag, SVG-RCA, [facility] Cath 2010: Stents in the proximal LAD with 40% in-stent restenosis and 50% post-stent stenosis, 80%  small OM 1, 70% small OM 2, 40% first PLA; diffuse 70% distal RCA; patent SVG to diagonal, patent SVG to distal RCA, patent atretic LIMA EF 55%; normal nuclear stress test 2011  . Bilateral carotid artery occlusion 09/25/2013  . 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 Allergy   . Anxiety   . Asthma   . Cataract   . CHF (congestive heart failure)   . Diabetes mellitus without complication   . GERD (gastroesophageal reflux disease)   . Glaucoma   . Heart murmur   . Hypertension   . Hyperlipidemia   . Osteoporosis   . Arthritis      Home Meds: Outpatient Prescriptions Prior to Visit  Medication Sig Dispense Refill  . acetaminophen (TYLENOL) 500 MG tablet Take 1,500 mg by mouth every 6 (six) hours as needed for mild pain.     Marland Kitchen aspirin EC 325 MG EC tablet Take 1 tablet (325 mg total) by mouth 2 (two) times daily after a meal. Take 1 month post op to decrease risk of blood clots. 60 tablet 0  . calcium carbonate 200 MG capsule Take 250 mg by mouth 2 (two) times daily with a meal.     .  citalopram (CELEXA) 40 MG tablet Take 1 tablet (40 mg total) by mouth daily. 30 tablet 5  . ferrous sulfate 325 (65 FE) MG tablet Take 325 mg by mouth 2 (two) times daily with a meal.     . furosemide (LASIX) 80 MG tablet Take 1 tablet (80 mg total) by mouth daily. 30 tablet 5  . loperamide (IMODIUM A-D) 2 MG tablet Take 2 mg by mouth as needed for diarrhea or loose stools.    . methocarbamol (ROBAXIN) 500 MG tablet Take 1 tablet by mouth as needed.    . metolazone (ZAROXOLYN) 2.5 MG tablet Take one tablet daily  X 3 days then as needed if weight is greater than 155 lbs. 90 tablet 3  . metoprolol tartrate (LOPRESSOR) 25 MG tablet Take 1 tablet (25 mg total) by mouth 2 (two) times daily.    Marland Kitchen morphine (AVINZA) 30 MG 24 hr capsule Take 1 capsule (30 mg total) by mouth every 8 (eight) hours. 42 capsule 0  . NEXIUM 40 MG capsule Take 1 capsule by mouth daily.    . nitroGLYCERIN (NITROLINGUAL) 0.4  MG/SPRAY spray USE ONE TO TWO SPRAYS UNDER THE TONGUE EVERY 5 MINUTES AS NEEDED FOR CHEST PAIN 12 g 5  . potassium chloride (K-DUR) 10 MEQ tablet Take 1 tablet (10 mEq total) by mouth daily. 30 tablet 5  . Prenatal Multivit-Min-Fe-FA (PRE-NATAL PO) Take by mouth.    . Vitamin D, Ergocalciferol, (DRISDOL) 50000 UNITS CAPS capsule Take 1 capsule (50,000 Units total) by mouth every 7 (seven) days. 30 capsule 0  . meloxicam (MOBIC) 7.5 MG tablet Take 1 tablet (7.5 mg total) by mouth daily. (Patient not taking: Reported on 02/28/2015) 30 tablet 11   No facility-administered medications prior to visit.    Allergies:  Allergies  Allergen Reactions  . Latex   . Metoclopramide Hcl   . Neurontin [Gabapentin]   . Penicillins     History   Social History  . Marital Status: Divorced    Spouse Name: N/A  . Number of Children: 2  . Years of Education: N/A   Occupational History  . disabled    Social History Main Topics  . Smoking status: Former Smoker    Quit date: 12/18/1987  . Smokeless tobacco: Never Used  . Alcohol Use: No  . Drug Use: No  . Sexual Activity: Not Currently   Other Topics Concern  . Not on file   Social History Narrative   She used to work with the  [employer]. 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    Family History  Problem Relation Age of Onset  . Diabetes Brother   . Asthma Brother   . Depression Brother   . Asthma Brother   . Depression Brother   . Diabetes Brother   . Cancer Father     skin  . Alcohol abuse Father   . Arthritis Father   . Depression Father   . Hypertension Father   . Diabetes Mother   . Heart failure Mother   . Arthritis Mother   . Heart disease Mother   . Asthma Sister   . Heart attack Sister   . Depression Sister   . Diabetes Sister   . Drug abuse Sister   . Heart disease Sister   . Mental illness Sister   . Heart attack Son   . Hypertension Son   . Kidney  disease Son  transplant 2003  . Vision loss Son      Review of Systems:  See HPI for pertinent ROS. All other ROS negative.    Physical Exam: Blood pressure 118/76, pulse 89, temperature 99 F (37.2 C), temperature source Oral, resp. rate 18, weight 150 lb (68.04 kg)., Body mass index is 26.98 kg/(m^2). General: WNWD WF. Appears in no acute distress. Head: Normocephalic, atraumatic, eyes without discharge, sclera non-icteric, nares are without discharge. Bilateral auditory canals clear, TM's are without perforation, pearly grey and translucent with reflective cone of light bilaterally. Oral cavity moist, posterior pharynx without exudate, erythema, peritonsillar abscess.  Neck: Supple. No thyromegaly. No lymphadenopathy. Lungs: Mild wheezing throughout bilaterally.  Heart: RRR with S1 S2. No murmurs, rubs, or gallops. Abdomen: Soft, non-tender, non-distended with normoactive bowel sounds. No hepatomegaly. No rebound/guarding. No obvious abdominal masses. No area of focal/localized increased tenderness.  Musculoskeletal:  Strength and tone normal for age. Extremities/Skin: Warm and dry. No edema.  Neuro: Alert and oriented X 3. Moves all extremities spontaneously. Gait is normal. CNII-XII grossly in tact. Psych:  Responds to questions appropriately with a normal affect.     ASSESSMENT AND PLAN:  64 y.o. year old female with    Diarrhea - CBC with Differential/Platelet - COMPLETE METABOLIC PANEL WITH GFR  Non-intractable vomiting with nausea, vomiting of unspecified type - promethazine (PHENERGAN) 25 MG tablet; Take 1 tablet (25 mg total) by mouth every 8 (eight) hours as needed for nausea or vomiting.  Dispense: 20 tablet; Refill: 0 - CBC with Differential/Platelet - COMPLETE METABOLIC PANEL WITH GFR  Bacterial respiratory infection - azithromycin (ZITHROMAX) 250 MG tablet; Day 1: Take 2 daily. Days 2-5: Take 1 daily.  Dispense: 6 tablet; Refill: 0 - predniSONE (DELTASONE) 20  MG tablet; Take 2 daily for 5 days  Dispense: 10 tablet; Refill: 0 - CBC with Differential/Platelet  H/O gastric bypass - COMPLETE METABOLIC PANEL WITH GFR  Hypocalcemia - COMPLETE METABOLIC PANEL WITH GFR  Protein-calorie malnutrition, severe - COMPLETE METABOLIC PANEL WITH GFR  Discussed with patient at length that we have to get her vomiting and diarrhea controlled prior to her using any other type medications.  Discussed that with her present vomiting and diarrhea, she cannot possibly be absorbing any medication. She is to go home and use the Phenergan and use Imodium. After she gets the vomiting and diarrhea controlled, then she is to take the prednisone and azithromycin as directed above. As well, will check lab and will follow-up those results. If vomiting and diarrhea are not controlled over the next 24-48 hours, she is to call for follow-up.  She has h/o the following:  1. Iron deficiency anemia  2. Coronary artery disease involving native coronary artery of native heart with angina pectoris  3. Bilateral carotid artery occlusion  4. AVNRT s/p successful RF ablation  5. H/O gastric bypass - COMPLETE METABOLIC PANEL WITH GFR  6. Hypocalcemia - COMPLETE METABOLIC PANEL WITH GFR  7. Protein-calorie malnutrition, severe - COMPLETE METABOLIC PANEL WITH GFR  8. Depression  9. Primary osteoarthritis of right hip   Signed, [address] [place], [place], [facility] 06/07/2015 12:48 PM

## 2015-10-20 ENCOUNTER — Telehealth: Payer: Self-pay | Admitting: Cardiovascular Disease

## 2015-10-20 NOTE — Telephone Encounter (Signed)
Received notes from [facility] for appointment on 10/24/15 with Dr Sallyanne Kuster.  Records given to [facility] (medical records) for Dr Croitoru's schedule on 10/24/15. lp

## 2015-10-24 ENCOUNTER — Ambulatory Visit (INDEPENDENT_AMBULATORY_CARE_PROVIDER_SITE_OTHER): Payer: Federal, State, Local not specified - PPO | Admitting: Cardiovascular Disease

## 2015-10-24 ENCOUNTER — Encounter: Payer: Self-pay | Admitting: Cardiovascular Disease

## 2015-10-24 VITALS — BP 106/56 | HR 66 | Ht 65.0 in | Wt 148.0 lb

## 2015-10-24 DIAGNOSIS — Z9884 Bariatric surgery status: Secondary | ICD-10-CM

## 2015-10-24 DIAGNOSIS — R9431 Abnormal electrocardiogram [ECG] [EKG]: Secondary | ICD-10-CM

## 2015-10-24 DIAGNOSIS — E785 Hyperlipidemia, unspecified: Secondary | ICD-10-CM

## 2015-10-24 DIAGNOSIS — Z79899 Other long term (current) drug therapy: Secondary | ICD-10-CM

## 2015-10-24 DIAGNOSIS — G453 Amaurosis fugax: Secondary | ICD-10-CM

## 2015-10-24 DIAGNOSIS — I471 Supraventricular tachycardia, unspecified: Secondary | ICD-10-CM

## 2015-10-24 DIAGNOSIS — I25119 Atherosclerotic heart disease of native coronary artery with unspecified angina pectoris: Secondary | ICD-10-CM | POA: Diagnosis not present

## 2015-10-24 DIAGNOSIS — I4719 Other supraventricular tachycardia: Secondary | ICD-10-CM

## 2015-10-24 DIAGNOSIS — I5032 Chronic diastolic (congestive) heart failure: Secondary | ICD-10-CM

## 2015-10-24 DIAGNOSIS — Z9889 Other specified postprocedural states: Secondary | ICD-10-CM

## 2015-10-24 DIAGNOSIS — I6523 Occlusion and stenosis of bilateral carotid arteries: Secondary | ICD-10-CM

## 2015-10-24 LAB — CBC
HCT: 38.8 % (ref 36.0–46.0)
Hemoglobin: 13.3 g/dL (ref 12.0–15.0)
MCH: 30.4 pg (ref 26.0–34.0)
MCHC: 34.3 g/dL (ref 30.0–36.0)
MCV: 88.8 fL (ref 78.0–100.0)
MPV: 11.1 fL (ref 8.6–12.4)
Platelets: 178 10*3/uL (ref 150–400)
RBC: 4.37 MIL/uL (ref 3.87–5.11)
RDW: 14.9 % (ref 11.5–15.5)
WBC: 7.3 10*3/uL (ref 4.0–10.5)

## 2015-10-24 NOTE — Patient Instructions (Signed)
WE NEED YOU TO MAKE AN APPOINTMENT FOR Thursday OR Friday FOR AN EKG ONLY3 MONTHS.  Dr. Sallyanne Kuster recommends that you schedule a follow-up appointment in: 3 MONTHS

## 2015-10-24 NOTE — Progress Notes (Signed)
Patient ID: [name], female   DOB: 1951-10-23, 64 y.o.   MRN: [id number]     Cardiology Office Note   Date:  10/26/2015   ID:  [name], DOB Nov 11, 1951, MRN [id number]  PCP:  Karis Juba, PA-C  Cardiologist:   Sanda Klein, MD   Chief Complaint  Patient presents with  . 8 months  . Edema    legs      History of Present Illness: [name] is a 64 y.o. female who presents for a recent episode of right eye amaurosis fugax lasting 20 minutes.She was referred back by Dr. Satira Sark from [facility]. She is known to have complete carotid occlusion bilaterally with cerebral flow via the vertebral system.  It immediately became apparent that she is severely depressed, dating back to the death of her son about 18 months ago. She cried the whole visit. She has stopped all of her meds, including ASA. Just 2 days ago she restarted celexa. Almost all her family support system is abroad in [place]. Her daughter is estranged (she surreptitiously maintains occasional contact with her grandchildren via social media).  She has no CV complaints, but her ECG is abnormal, with prolonged QT (507 ms) and symmetrical prominent T waves and nonspecific ST elevation in the septal leads. Weight is steady for weeks around 142 lb home scale (office 1-2 lb higher).  Labs obtained after she left suggest that the ECG changes may be due to severe hypocalcemia ( a recurrent problem after her gastric bypass).  She underwent coronary artery bypass surgery approximately 21 years ago. She has known chronic total bilateral carotid occlusion - her entire cerebral vascular blood supply is via large vertebral arteries. She has a remote history of diabetes mellitus and hypertension and hyperlipidemia. After gastric bypass surgery and substantial weight loss she has been very well compensated and has not required medications for these disorders in a few years. 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 She has a known history of diastolic heart failure that has usually been well compensated (NYHA functional class I-II). Nuclear perfusion study November 16, 2014 was low risk, EF 56%. Echo October 8938 showed diastolic dysfunction and high filling pressure (when weight was around 161 lb).  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   Past Medical History  Diagnosis Date  . Ischemic heart disease   . Diastolic dysfunction   . Chronic anemia   . History of recurrent UTIs   . Peroneal neuropathy   . Retinopathy   . Stroke ([place])   . Hypocalcemia   . Morbid obesity ([place])     s/p gastric bypass  . Depression   . CAD s/p CABG 1994 09/25/2013    LIMA-LAD, SVG Diag, SVG-RCA, [place] Cath 2010: Stents in the proximal LAD with 40% in-stent restenosis and 50% post-stent stenosis, 80% small OM 1, 70% small OM 2, 40% first PLA; diffuse 70% distal RCA; patent SVG to diagonal, patent SVG to distal RCA, patent atretic LIMA EF 55%; normal nuclear stress test 2011  . Bilateral carotid artery occlusion 09/25/2013  . 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 Allergy   . Anxiety   . Asthma   . Cataract   . CHF (congestive heart  failure) ([place])   . Diabetes mellitus without complication ([place])   . GERD (gastroesophageal reflux disease)   . Glaucoma   . Heart murmur   . Hypertension   . Hyperlipidemia   . Osteoporosis   . Arthritis     Past Surgical History  Procedure Laterality Date  . Ventral hernia repair    . Appendectomy    . Vaginal hysterectomy    . Coronary artery bypass graft  1994    LIM to LAD,SVG to first diagonal,SVG to distal RCA  . Cardiac catheterization   03/30/2009    3 vessel CAD,patent grafts  . Gastric bypass  2002  . Cholecystectomy    . Heel spur excision  2012  . Trigger finger release  2012  . Radiofrequency ablation      AVNRT - successful  . Nm myocar perf wall motion  2011    no ischemia  . US echocardiography  05/09/2010    mild MR,mild mitral and aortic sclerosis  . Small intestine surgery    . Hernia repair    . Total hip arthroplasty Right 12/27/2014    Procedure: TOTAL HIP ARTHROPLASTY ANTERIOR APPROACH;  Surgeon: Alta Corning, MD;  Location: [facility];  Service: Orthopedics;  Laterality: Right;     Current Outpatient Prescriptions  Medication Sig Dispense Refill  . acetaminophen (TYLENOL) 500 MG tablet Take 1,500 mg by mouth every 6 (six) hours as needed for mild pain.     Marland Kitchen aspirin EC 325 MG EC tablet Take 1 tablet (325 mg total) by mouth 2 (two) times daily after a meal. Take 1 month post op to decrease risk of blood clots. 60 tablet 0  . citalopram (CELEXA) 40 MG tablet Take 1 tablet (40 mg total) by mouth daily. 30 tablet 5  . morphine (AVINZA) 30 MG 24 hr capsule Take 1 capsule (30 mg total) by mouth every 8 (eight) hours. 42 capsule 0  . NEXIUM 40 MG capsule Take 1 capsule by mouth daily.    . nitroGLYCERIN (NITROLINGUAL) 0.4 MG/SPRAY spray USE ONE TO TWO SPRAYS UNDER THE TONGUE EVERY 5 MINUTES AS NEEDED FOR CHEST PAIN 12 g 5   No current facility-administered medications for this visit.    Allergies:   Latex; Metoclopramide hcl; Neurontin; and Penicillins    Social History:  The patient  reports that she quit smoking about 27 years ago. She has never used smokeless tobacco. She reports that she does not drink alcohol or use illicit drugs.   Family History:  The patient's family history includes Alcohol abuse in her father; Arthritis in her father and mother; Asthma in her brother, brother, and sister; Cancer in her father; Depression in her brother, brother, father, and sister; Diabetes in her brother, brother,  mother, and sister; Drug abuse in her sister; Heart attack in her sister and son; Heart disease in her mother and sister; Heart failure in her mother; Hypertension in her father and son; Kidney disease in her son; Mental illness in her sister; Vision loss in her son.    ROS:  Please see the history of present illness.    Otherwise, review of systems positive for depressed mood, anhedonia, hopelessness. Denies suicidal thoughts or actions..   All other systems are reviewed and negative.    PHYSICAL EXAM: VS:  BP 106/56 mmHg  Pulse 66  Ht 5\' 5"  (1.651 m)  Wt 148 lb (67.132 kg)  BMI 24.63 kg/m2 , BMI Body mass index is 24.63 kg/(m^2).  General: Alert, oriented x3, crying Head: no evidence of trauma, PERRL, EOMI, no exophtalmos or lid lag, no myxedema, no xanthelasma; normal ears, nose and oropharynx Neck: normal jugular venous pulsations and no hepatojugular reflux; brisk carotid pulses without delay and no carotid bruits Chest: clear to auscultation, no signs of consolidation by percussion or palpation, normal fremitus, symmetrical and full respiratory excursions Cardiovascular: normal position and quality of the apical impulse, regular rhythm, normal first and second heart sounds, no murmurs, rubs or gallops Abdomen: no tenderness or distention, no masses by palpation, no abnormal pulsatility or arterial bruits, normal bowel sounds, no hepatosplenomegaly Extremities: no clubbing, cyanosis; 1+ right ankle edema (always asymmetrical, negative Doppler in the past); 2+ radial, ulnar and brachial pulses bilaterally; 2+ right femoral, posterior tibial and dorsalis pedis pulses; 2+ left femoral, posterior tibial and dorsalis pedis pulses; no subclavian or femoral bruits Neurological: grossly nonfocal Psych: depressed mood, flat affect   EKG:  EKG is ordered today. The ekg ordered today demonstrates NSR, QS and subtle nonspecific septal ST elevation, T wave inversion leads 2, 3, F, prolonged QTC  507 ms.   Recent Labs: 12/30/2014: TSH 0.789 10/24/2015: ALT 20; BUN 7; Creat 0.61; Hemoglobin 13.3; Magnesium 2.0; Platelets 178; Potassium 4.1; Sodium 139    Lipid Panel    Component Value Date/Time   CHOL 76* 10/24/2015 1453   TRIG 58 10/24/2015 1453   HDL 35* 10/24/2015 1453   CHOLHDL 2.2 10/24/2015 1453   VLDL 12 10/24/2015 1453   LDLCALC 29 10/24/2015 1453      Wt Readings from Last 3 Encounters:  10/24/15 148 lb (67.132 kg)  06/06/15 150 lb (68.04 kg)  02/28/15 148 lb (67.132 kg)      ASSESSMENT AND PLAN:  1. TIA / right eye amaurosis fugax - restart ASA. Known bilateral carotid occlusion, not surgical candidate. 2. CAD s/p remote CABG - angina free; worrisome ECG changes are most likely due to electrolyte abnormalities, especially hypocalcemia. 3. Diastolic heart failure, chronic - euvolemic, NYHA class 2. Symptoms, BP OK off furosemide and metoprolol. Keep off those for now at least. "Dry weight" around 142 lb. 4. Malabsorption s/p gastric bypass - especially hypocalcemia and Fe deficiency. Also had hypoalbuminemia, but Ca loweven after correcting for that. Resume calcium supplements. 5. Long QT - hypocalcemia 6. Chronic asymmetrical right ankle edema 7. Severe depression - able to talk about it; restarted celexa, no suicidal ideation    Current medicines are reviewed at length with the patient today.  The patient does not have concerns regarding medicines.  The following changes have been made:  Restart ASPIRIN  Labs/ tests ordered today include:  Orders Placed This Encounter  Procedures  . Comprehensive metabolic panel  . Lipid panel  . CBC  . Magnesium  . EKG 12-Lead     Patient Instructions  WE NEED YOU TO MAKE AN APPOINTMENT FOR Thursday OR Friday FOR AN EKG ONLY3 MONTHS.  Dr. Sallyanne Kuster recommends that you schedule a follow-up appointment in: Stollings, Abdou Stocks, MD  10/26/2015 9:44 PM    Sanda Klein, MD, [facility] [phone] office [phone] pager

## 2015-10-25 ENCOUNTER — Telehealth: Payer: Self-pay | Admitting: *Deleted

## 2015-10-25 LAB — COMPREHENSIVE METABOLIC PANEL
ALT: 20 U/L (ref 6–29)
AST: 23 U/L (ref 10–35)
Albumin: 3.4 g/dL — ABNORMAL LOW (ref 3.6–5.1)
Alkaline Phosphatase: 224 U/L — ABNORMAL HIGH (ref 33–130)
BUN: 7 mg/dL (ref 7–25)
CO2: 25 mmol/L (ref 20–31)
CREATININE: 0.61 mg/dL (ref 0.50–0.99)
Calcium: 6.5 mg/dL — ABNORMAL LOW (ref 8.6–10.4)
Chloride: 102 mmol/L (ref 98–110)
Glucose, Bld: 128 mg/dL — ABNORMAL HIGH (ref 65–99)
Potassium: 4.1 mmol/L (ref 3.5–5.3)
SODIUM: 139 mmol/L (ref 135–146)
TOTAL PROTEIN: 6.1 g/dL (ref 6.1–8.1)
Total Bilirubin: 0.8 mg/dL (ref 0.2–1.2)

## 2015-10-25 LAB — LIPID PANEL
Cholesterol: 76 mg/dL — ABNORMAL LOW (ref 125–200)
HDL: 35 mg/dL — ABNORMAL LOW (ref >=46)
LDL CALC: 29 mg/dL (ref <130)
TRIGLYCERIDES: 58 mg/dL (ref <150)
Total CHOL/HDL Ratio: 2.2 Ratio (ref <=5.0)
VLDL: 12 mg/dL (ref <30)

## 2015-10-25 LAB — MAGNESIUM: MAGNESIUM: 2 mg/dL (ref 1.5–2.5)

## 2015-10-25 NOTE — Telephone Encounter (Signed)
-----   Message from Sanda Klein, MD sent at 10/25/2015  8:20 AM EST ----- Calcium was quite low , even when adjusted for her low albumin level. This might explain the ECG changes. To get more precise evaluation, need to check ionized calcium, phosphorus and vitamin D levels. After those are drawn, please start Tums or equivalent calcium carbonate supplement 500 mg twice a day. Blood glucose very mildly high. Alkaline phosphatase is high and a sign of problems with bone metabolism due to low calcium. Cholesterol very low, as before.

## 2015-10-26 DIAGNOSIS — G453 Amaurosis fugax: Secondary | ICD-10-CM | POA: Insufficient documentation

## 2015-10-27 ENCOUNTER — Telehealth: Payer: Self-pay | Admitting: *Deleted

## 2015-10-27 ENCOUNTER — Ambulatory Visit (INDEPENDENT_AMBULATORY_CARE_PROVIDER_SITE_OTHER): Payer: Federal, State, Local not specified - PPO | Admitting: *Deleted

## 2015-10-27 ENCOUNTER — Telehealth: Payer: Self-pay | Admitting: Family Medicine

## 2015-10-27 DIAGNOSIS — R9431 Abnormal electrocardiogram [ECG] [EKG]: Secondary | ICD-10-CM | POA: Diagnosis not present

## 2015-10-27 DIAGNOSIS — Z79899 Other long term (current) drug therapy: Secondary | ICD-10-CM

## 2015-10-27 LAB — PHOSPHORUS: PHOSPHORUS: 3.6 mg/dL (ref 2.1–4.3)

## 2015-10-27 LAB — VITAMIN D 25 HYDROXY (VIT D DEFICIENCY, FRACTURES): VIT D 25 HYDROXY: 5 ng/mL — AB (ref 30–100)

## 2015-10-27 LAB — CALCIUM, IONIZED: Calcium, Ion: 0.85 mmol/L — ABNORMAL LOW (ref 1.12–1.32)

## 2015-10-27 MED ORDER — VITAMIN D (ERGOCALCIFEROL) 1.25 MG (50000 UNIT) PO CAPS: 50000.0000 [IU] | ORAL_CAPSULE | ORAL | Status: DC

## 2015-10-27 MED ORDER — ESOMEPRAZOLE MAGNESIUM 40 MG PO CPDR: 40.0000 mg | DELAYED_RELEASE_CAPSULE | Freq: Every day | ORAL | Status: DC

## 2015-10-27 NOTE — Telephone Encounter (Signed)
Pt aware of RX.  Understands need to follow up with [place] and Cardiologist

## 2015-10-27 NOTE — Patient Instructions (Signed)
Follow up as scheduled. We will call if any further instructions.

## 2015-10-27 NOTE — Telephone Encounter (Signed)
-----   Message from Orlena Sheldon, PA-C sent at 10/27/2015 12:51 PM EST ----- Take Ergocalciferol 50,000 units weekly # 12 +1

## 2015-10-27 NOTE — Progress Notes (Signed)
Rechecked EKG. Pt had EKG performed on 11/7 which "demonstrated NSR, QS and subtle nonspecific septal ST elevation, T wave inversion leads 2, 3, F, prolonged QTC 507 ms." Pt was instructed to return today or Friday for repeat EKG.  12-lead performed and awaits Dr. Victorino December review & signature. Pt instructed on returning for 3 month f/u and going for labwork.

## 2015-10-27 NOTE — Telephone Encounter (Signed)
-----   Message from Sanda Klein, MD sent at 10/27/2015 12:01 PM EST ----- Vitamin D levels VERY low, consequently low calcium levels. She should also restart Vit D supplements and have labs rechecked in a month. This level of hypocalcemia can actually be dangerous. Would like to repeat her labs in a month and ECG after the calcium level is corrected. Probably best handled through her PCP, but would not delay restarting vit D and calcium supplements.

## 2015-10-27 NOTE — Telephone Encounter (Signed)
Lab results called to patient.  States she restarted her Vit D the other day (takes the weekly dose).  The calcium she can take is the citrate not the carbonate.  Will have labs rechecked in one month along with another EKG.  Having an EKG done today.  Will pick up the lab order and schedule repeat EKG in one month.  Results sent to PCP.

## 2015-10-31 ENCOUNTER — Telehealth: Payer: Self-pay | Admitting: Family Medicine

## 2015-11-01 ENCOUNTER — Other Ambulatory Visit: Payer: Self-pay | Admitting: *Deleted

## 2015-11-01 DIAGNOSIS — R9431 Abnormal electrocardiogram [ECG] [EKG]: Secondary | ICD-10-CM

## 2015-11-01 NOTE — Telephone Encounter (Signed)
PA has been started for Nexium 40 mg thru [facility] case [id number].

## 2015-11-02 ENCOUNTER — Encounter: Payer: Self-pay | Admitting: Cardiovascular Disease

## 2015-11-02 NOTE — Addendum Note (Signed)
Addended by: Janett Labella A on: 11/02/2015 03:14 PM   Modules accepted: Orders

## 2015-11-04 NOTE — Telephone Encounter (Signed)
Nexium has been approved.

## 2015-11-26 IMAGING — CT CT ABD-PELV W/ CM
2 of 5 series · 14 of 46 positions shown, 16 images · IV contrast (APPLIED)
Comparison: CT of abdomen and pelvis 04/24/2011.

CLINICAL DATA: Lower abdominal pain for the past 3 weeks. Explosive
diarrhea.

EXAM:
CT ABDOMEN AND PELVIS WITH CONTRAST
TECHNIQUE: Multidetector CT imaging of the abdomen and pelvis was performed
using the standard protocol following bolus administration of
intravenous contrast.
CONTRAST:  100mL OMNIPAQUE IOHEXOL 300 MG/ML  SOLN

[Series 2: abd/ pelvis 5.0 i30f 1 · axial · 0.86mm/px · z∈[+854,+1234]mm · 11 of 86 slices shown, 13 images]
[im 5/86  soft-tissue]
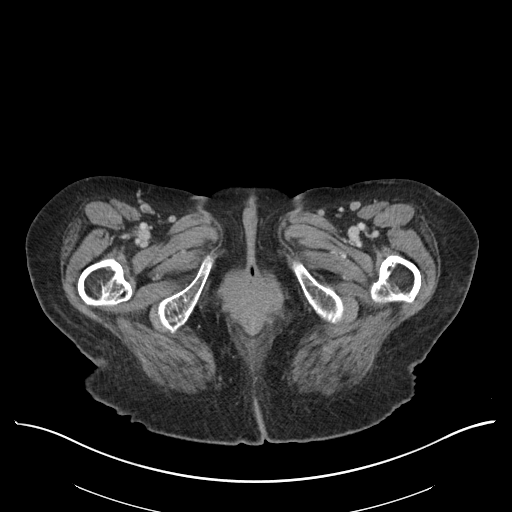
[im 5/86  bone]
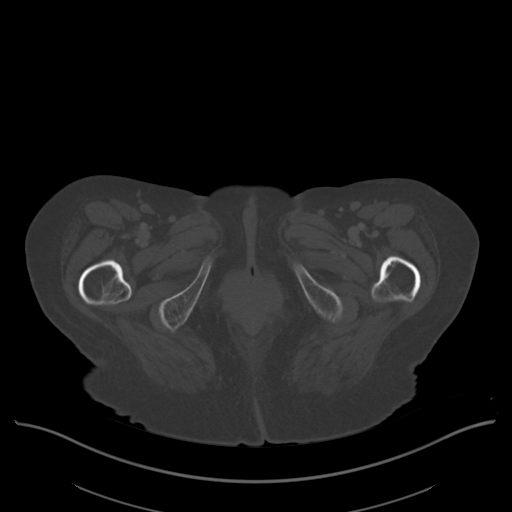
[im 15/86  soft-tissue]
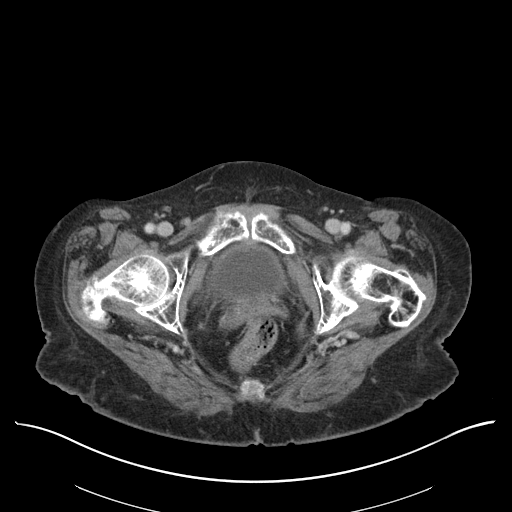
[im 19/86  soft-tissue]
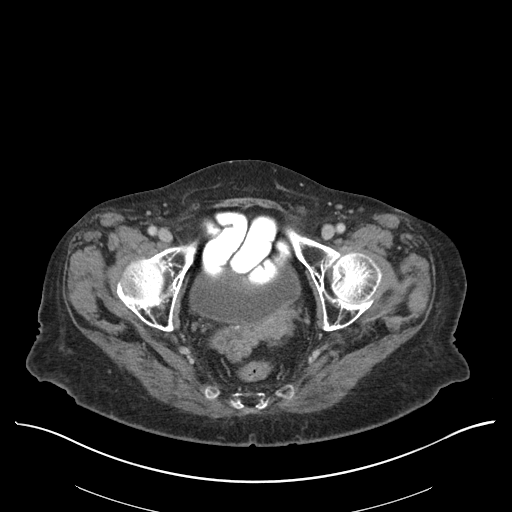
[im 29/86  soft-tissue]
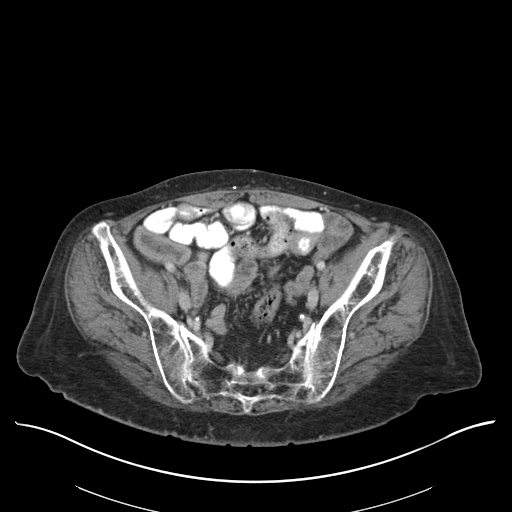
[im 34/86  soft-tissue]
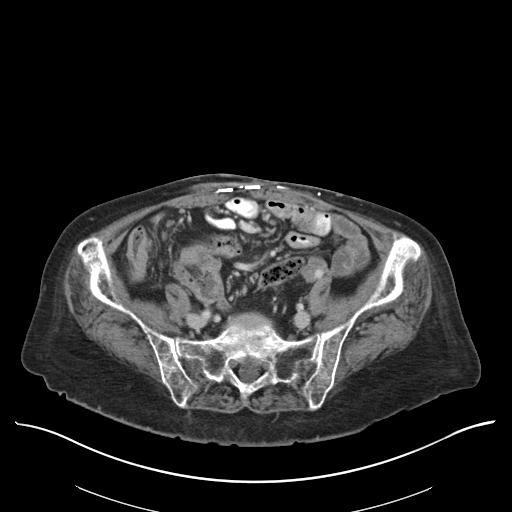
[im 43/86  soft-tissue]
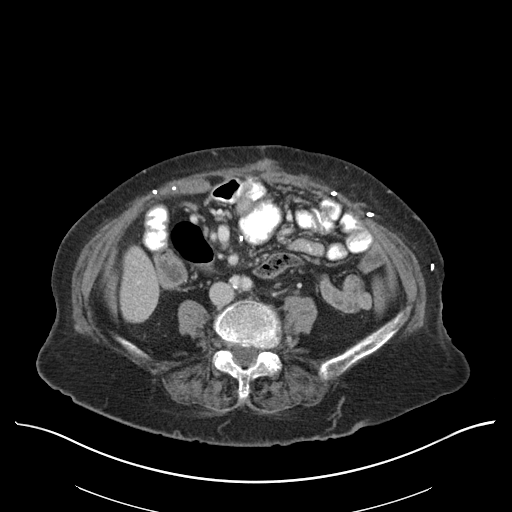
[im 52/86  soft-tissue]
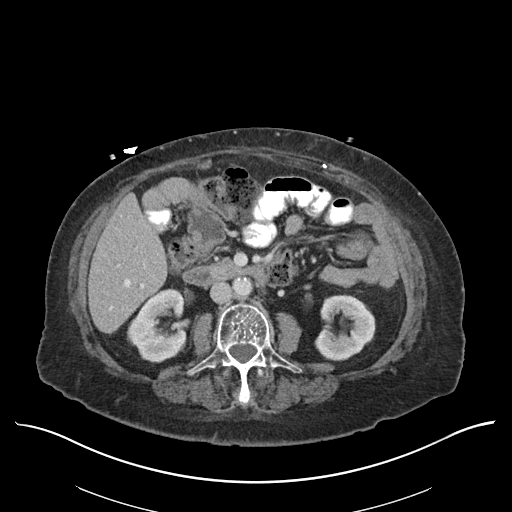
[im 57/86  soft-tissue]
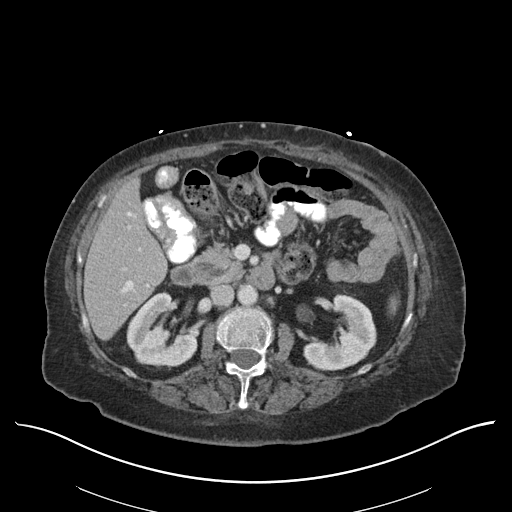
[im 67/86  soft-tissue]
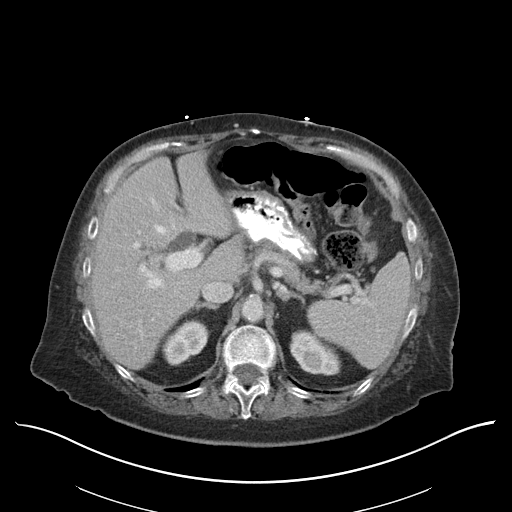
[im 67/86  bone]
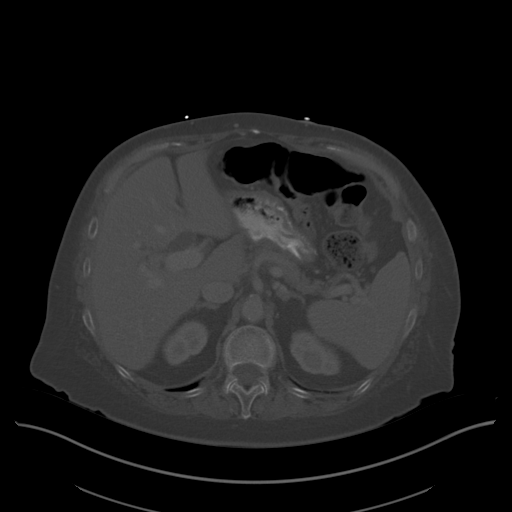
[im 71/86  soft-tissue]
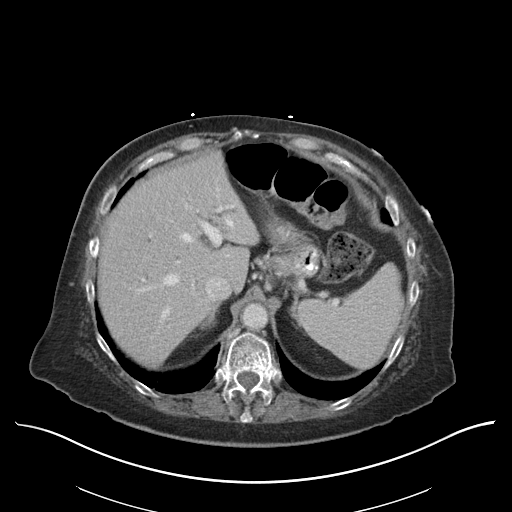
[im 81/86  soft-tissue]
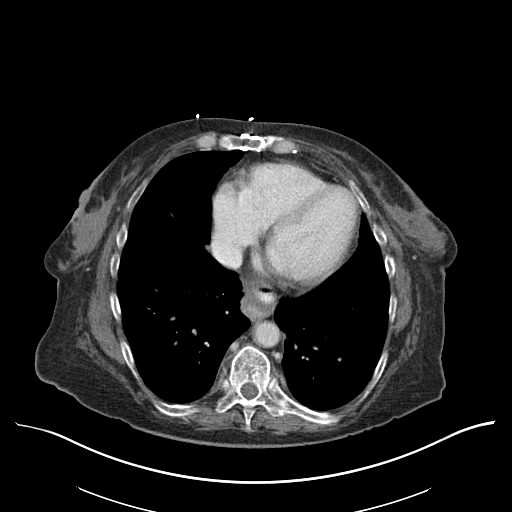

[Series 5: coronal soft tissue · coronal · 0.63mm/px · 3 of 80 slices shown]
[im 27/80  soft-tissue]
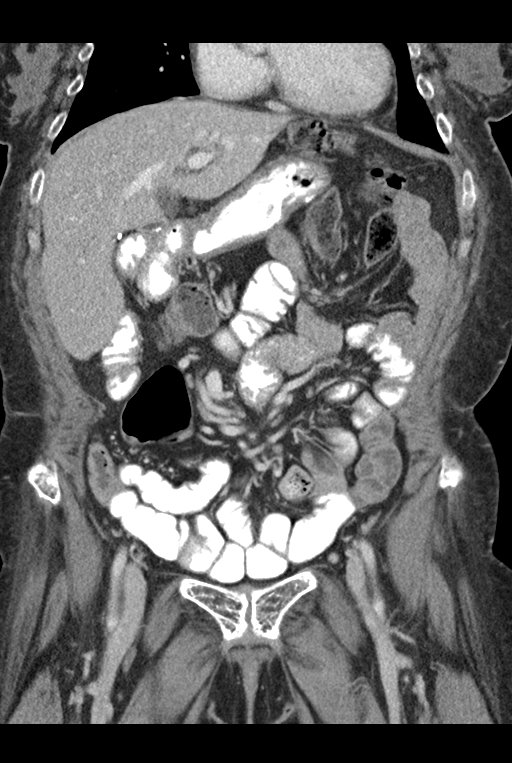
[im 36/80  soft-tissue]
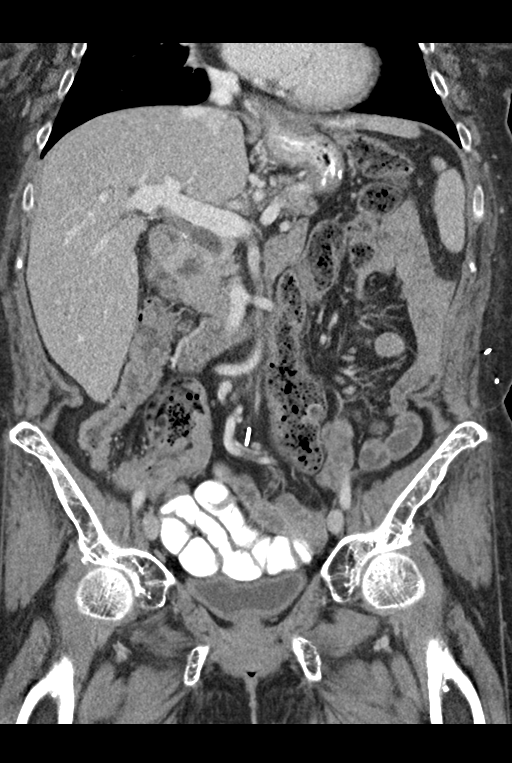
[im 44/80  soft-tissue]
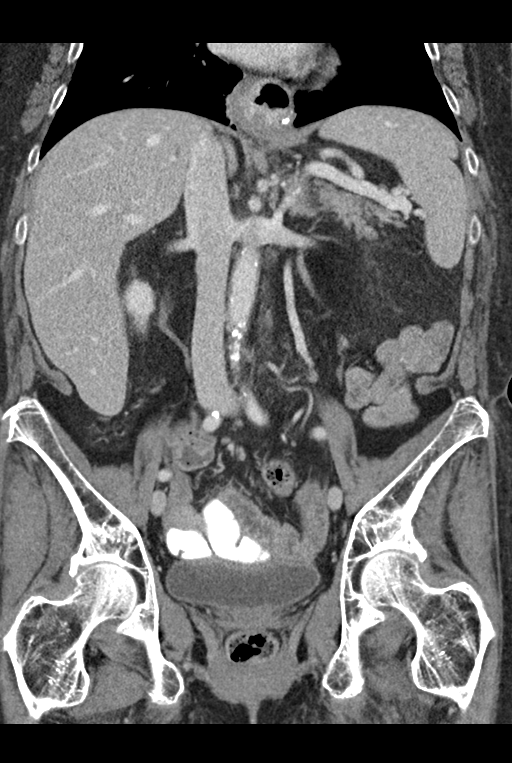

[14 of 46 positions shown; findings below may reference images not displayed]

FINDINGS: Lung Bases: Small amount of ground-glass attenuation in the medial
aspect of the right lower lobe may reflect sequela of recent
aspiration. Borderline cardiomegaly. Status post median sternotomy.
Moderate size hiatal hernia.

Abdomen/Pelvis: Status post cholecystectomy. Mild intrahepatic
biliary ductal dilatation, similar to the prior study. Common bile
duct is mildly dilated measuring 15 mm in the porta hepatis. This is
favored to be functional biliary ductal dilatation in a
postcholecystectomy patient rather than indicative of obstruction.
Small duodenal diverticulum medial to the second portion of the
duodenum incidentally noted. The appearance of the pancreas, spleen,
bilateral adrenal glands and bilateral kidneys is unremarkable.
Atherosclerosis throughout the abdominal and pelvic vasculature,
without definite aneurysm or dissection.

No significant volume of ascites. No pneumoperitoneum. No pathologic
distention of small bowel. There is mural thickening involving the
cecum and proximal ascending colon, suggestive of colitis. The
remainder the colon does not demonstrate wall thickening. The
terminal ileum is unremarkable in appearance. Surgical clips are
noted throughout the small bowel mesenteries, and there
postoperative changes in the anterior abdominal wall, potentially
related to prior hernia repair. Status post hysterectomy. Ovaries
are not confidently identified may be surgically absent or atrophic.
Urinary bladder is remarkable for a tiny locule of gas non
dependently.

Musculoskeletal: Bones are osteopenic. Compared to prior study from
04/24/2011 there has been mild compression of the L5 vertebral body
with approximately 10-15% loss of vertebral body height. No
surrounding soft tissue swelling on today's examination to suggest
an acute fracture. There are no aggressive appearing lytic or
blastic lesions noted in the visualized portions of the skeleton.
IMPRESSION: 1. Mural thickening of the cecum and ascending colon suggestive of a
colitis.
2. No other acute findings are identified in the abdomen or pelvis
at this time.
3. Probable functional biliary ductal dilatation in a post
cholecystectomy patient, as above.
4. Age indeterminate compression fracture of L5 with approximately
10-15% loss of vertebral body height. This does not appear to be
acute, but is new compared to remote prior study from 04/24/2011.
5. Small locule of gas non dependently within the lumen of the
urinary bladder. If the patient has been catheterized for a recent
urinalysis, this is presumably iatrogenic. In the absence of such
history, clinical correlation for signs and symptoms of urinary
tract infection would be recommended, as infection with gas-forming
organisms could also account for this finding.
6. Moderate size hiatal hernia.
7. Additional incidental findings, as detailed above.

## 2015-11-28 ENCOUNTER — Ambulatory Visit (INDEPENDENT_AMBULATORY_CARE_PROVIDER_SITE_OTHER): Payer: Federal, State, Local not specified - PPO

## 2015-11-28 ENCOUNTER — Telehealth: Payer: Self-pay | Admitting: Cardiovascular Disease

## 2015-11-28 VITALS — BP 100/70 | HR 62 | Ht 62.0 in | Wt 154.2 lb

## 2015-11-28 DIAGNOSIS — I208 Other forms of angina pectoris: Secondary | ICD-10-CM

## 2015-11-28 DIAGNOSIS — I471 Supraventricular tachycardia: Secondary | ICD-10-CM

## 2015-11-28 NOTE — Patient Instructions (Signed)
Continue same medications   Keep appointment with Dr.Croitoru Thursday 01/25/15 at 1:45 pm.

## 2015-11-28 NOTE — Progress Notes (Signed)
1.) Reason for visit: EKG  2.) Name of MD requesting visit:Dr. Croitoru  3.) H&P: 10/24/15 EKG prolonged QT  4.) ROS related to problem: DOD Dr.Crenshaw reviewed EKG which was normal.  5.) Assessment and plan per MD: Continue same medications.Keep appointment with Dr.Croitoru as planned.

## 2015-11-28 NOTE — Telephone Encounter (Signed)
Janette called in stating that the pt is in the office and she wanted to know which labs needed to be drawn. Please f/u with her  Thanks

## 2015-11-28 NOTE — Telephone Encounter (Signed)
Clarified OK to draw pending labs, nothing else required per recent notes.

## 2015-11-29 LAB — COMPREHENSIVE METABOLIC PANEL
ALBUMIN: 3.1 g/dL — AB (ref 3.6–5.1)
ALK PHOS: 180 U/L — AB (ref 33–130)
ALT: 20 U/L (ref 6–29)
AST: 22 U/L (ref 10–35)
BILIRUBIN TOTAL: 0.5 mg/dL (ref 0.2–1.2)
BUN: 10 mg/dL (ref 7–25)
CALCIUM: 6.5 mg/dL — AB (ref 8.6–10.4)
CO2: 22 mmol/L (ref 20–31)
Chloride: 107 mmol/L (ref 98–110)
Creat: 0.63 mg/dL (ref 0.50–0.99)
Glucose, Bld: 80 mg/dL (ref 65–99)
POTASSIUM: 4.4 mmol/L (ref 3.5–5.3)
Sodium: 138 mmol/L (ref 135–146)
Total Protein: 5.5 g/dL — ABNORMAL LOW (ref 6.1–8.1)

## 2015-11-29 LAB — CALCIUM, IONIZED: Calcium, Ion: 0.92 mmol/L — ABNORMAL LOW (ref 1.12–1.32)

## 2015-12-01 LAB — VITAMIN D 1,25 DIHYDROXY
VITAMIN D2 1, 25 (OH): 15 pg/mL
VITAMIN D3 1, 25 (OH): 24 pg/mL
Vitamin D 1, 25 (OH)2 Total: 39 pg/mL (ref 18–72)

## 2016-01-26 ENCOUNTER — Ambulatory Visit: Payer: Federal, State, Local not specified - PPO | Admitting: Cardiovascular Disease

## 2016-01-26 ENCOUNTER — Ambulatory Visit (INDEPENDENT_AMBULATORY_CARE_PROVIDER_SITE_OTHER): Payer: Federal, State, Local not specified - PPO | Admitting: Cardiovascular Disease

## 2016-01-26 ENCOUNTER — Encounter: Payer: Self-pay | Admitting: Cardiovascular Disease

## 2016-01-26 VITALS — BP 132/64 | HR 84 | Ht 62.0 in | Wt 155.3 lb

## 2016-01-26 DIAGNOSIS — I25119 Atherosclerotic heart disease of native coronary artery with unspecified angina pectoris: Secondary | ICD-10-CM | POA: Diagnosis not present

## 2016-01-26 DIAGNOSIS — I471 Supraventricular tachycardia: Secondary | ICD-10-CM

## 2016-01-26 DIAGNOSIS — Z9884 Bariatric surgery status: Secondary | ICD-10-CM

## 2016-01-26 DIAGNOSIS — I5032 Chronic diastolic (congestive) heart failure: Secondary | ICD-10-CM

## 2016-01-26 DIAGNOSIS — F32A Depression, unspecified: Secondary | ICD-10-CM

## 2016-01-26 DIAGNOSIS — Z9889 Other specified postprocedural states: Secondary | ICD-10-CM

## 2016-01-26 DIAGNOSIS — I6523 Occlusion and stenosis of bilateral carotid arteries: Secondary | ICD-10-CM | POA: Diagnosis not present

## 2016-01-26 DIAGNOSIS — D509 Iron deficiency anemia, unspecified: Secondary | ICD-10-CM

## 2016-01-26 DIAGNOSIS — F329 Major depressive disorder, single episode, unspecified: Secondary | ICD-10-CM

## 2016-01-26 NOTE — Patient Instructions (Signed)
Dr. Croitoru recommends that you schedule a follow-up appointment in: 6 MONTHS   

## 2016-01-28 NOTE — Progress Notes (Signed)
Patient ID: [name], female   DOB: [birth year], 65 y.o.   MRN: [id number]     Cardiology Office Note    Date:  01/28/2016   ID:  [name], DOB 1951/09/01, MRN [id number]  PCP:  Karis Juba, PA-C  Cardiologist:   Sanda Klein, MD   Chief Complaint  Patient presents with  . Follow-up    3 month//CAD//pt c/o chest pain--has had a couple of times, dizziness when she bends over and stands back up--loss of balance, and swelling in bilateral legs    History of Present Illness:  [name] is a 65 y.o. female with multiple cardiac problems. She had been suffering a bout of severe depression and stopped taking all her medications. She is now feeling better and is helped by grief counseling through the church system. She has also started taking antidepressant.   She has had a couple of episodes of chest discomfort at rest (literally to events in the last 3 months) both resolved with sublingual nitroglycerin spray. Both events were very brief. Denies dyspnea or significant change in moderate lower extremity edema. Her weight is essentially unchanged from her visit in December. She has not had syncope or palpitations. She has noticed dizziness when she bends over and stands up quickly. Her last appointment she reported an event that sounded like a amaurosis fugax of the right eye. She has restarted aspirin. This has not recurred and she has not had other neurological symptoms.  She underwent coronary artery bypass surgery approximately 21 years ago. She has known chronic total bilateral carotid occlusion - her entire cerebral vascular blood supply is via large vertebral arteries. She has a remote history of diabetes mellitus and hypertension and hyperlipidemia. After gastric bypass surgery and substantial weight loss she has been very well compensated and has not required medications for these disorders in a few years. 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 She has a known history of diastolic heart failure that has usually been well compensated (NYHA functional class I-II). Nuclear perfusion study November 16, 2014 was low risk, EF 56%. Echo October 123456 showed diastolic dysfunction and high filling pressure (when weight was around 161 lb).  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  Past Medical History  Diagnosis Date  . Ischemic heart disease   . Diastolic dysfunction   . Chronic anemia   . History of recurrent UTIs   . Peroneal neuropathy   . Retinopathy   . Stroke ([place])   . Hypocalcemia   . Morbid obesity ([place])     s/p gastric bypass  . Depression   . CAD s/p CABG 1994 09/25/2013    LIMA-LAD, SVG Diag, SVG-RCA, [facility] Cath 2010: Stents in the proximal LAD with 40% in-stent restenosis and 50% post-stent stenosis, 80% small OM 1, 70% small OM 2, 40% first PLA; diffuse 70% distal RCA; patent SVG to diagonal, patent SVG to distal RCA, patent atretic LIMA EF 55%; normal nuclear stress test 2011  . Bilateral carotid artery occlusion 09/25/2013  . 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 Allergy   . Anxiety   . Asthma   . Cataract   . CHF (congestive heart failure) ([place])   . Diabetes mellitus without complication ([place])   .  GERD (gastroesophageal reflux disease)   . Glaucoma   . Heart murmur   . Hypertension   . Hyperlipidemia   . Osteoporosis   . Arthritis     Past Surgical History  Procedure Laterality Date  . Ventral hernia repair    . Appendectomy    . Vaginal hysterectomy    . Coronary artery bypass graft  1994    LIM to LAD,SVG to first diagonal,SVG to distal RCA  . Cardiac catheterization  03/30/2009    3 vessel CAD,patent  grafts  . Gastric bypass  2002  . Cholecystectomy    . Heel spur excision  2012  . Trigger finger release  2012  . Radiofrequency ablation      AVNRT - successful  . Nm myocar perf wall motion  2011    no ischemia  . US echocardiography  05/09/2010    mild MR,mild mitral and aortic sclerosis  . Small intestine surgery    . Hernia repair    . Total hip arthroplasty Right 12/27/2014    Procedure: TOTAL HIP ARTHROPLASTY ANTERIOR APPROACH;  Surgeon: Alta Corning, MD;  Location: [facility];  Service: Orthopedics;  Laterality: Right;    Outpatient Prescriptions Prior to Visit  Medication Sig Dispense Refill  . acetaminophen (TYLENOL) 500 MG tablet Take 1,500 mg by mouth every 6 (six) hours as needed for mild pain.     . citalopram (CELEXA) 40 MG tablet Take 1 tablet (40 mg total) by mouth daily. 30 tablet 5  . esomeprazole (NEXIUM) 40 MG capsule Take 1 capsule (40 mg total) by mouth daily. 90 capsule 1  . morphine (AVINZA) 30 MG 24 hr capsule Take 1 capsule (30 mg total) by mouth every 8 (eight) hours. 42 capsule 0  . nitroGLYCERIN (NITROLINGUAL) 0.4 MG/SPRAY spray USE ONE TO TWO SPRAYS UNDER THE TONGUE EVERY 5 MINUTES AS NEEDED FOR CHEST PAIN 12 g 5  . Vitamin D, Ergocalciferol, (DRISDOL) 50000 UNITS CAPS capsule Take 1 capsule (50,000 Units total) by mouth every 7 (seven) days. 12 capsule 1  . aspirin EC 325 MG EC tablet Take 1 tablet (325 mg total) by mouth 2 (two) times daily after a meal. Take 1 month post op to decrease risk of blood clots. 60 tablet 0   No facility-administered medications prior to visit.     Allergies:   Latex; Metoclopramide hcl; Neurontin; and Penicillins   Social History   Social History  . Marital Status: Divorced    Spouse Name: N/A  . Number of Children: 2  . Years of Education: N/A   Occupational History  . disabled    Social History Main Topics  . Smoking status: Former Smoker    Quit date: 12/18/1987  . Smokeless tobacco: Never Used  . Alcohol  Use: No  . Drug Use: No  . Sexual Activity: Not Currently   Other Topics Concern  . None   Social History Narrative   She used to work with the  [employer]. 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     Family History:  The patient's family history includes Alcohol abuse in her father; Arthritis in her father and mother; Asthma in her brother, brother, and sister; Cancer in her father; Depression in her brother, brother, father, and sister; Diabetes in her brother, brother, mother, and sister; Drug abuse in her sister; Heart attack in her sister and son; Heart disease in her mother and sister; Heart failure in  her mother; Hypertension in her father and son; Kidney disease in her son; Mental illness in her sister; Vision loss in her son.   ROS:   Please see the history of present illness.    ROS All other systems reviewed and are negative.   PHYSICAL EXAM:   VS:  BP 132/64 mmHg  Pulse 84  Ht 5\' 2"  (1.575 m)  Wt 70.444 kg (155 lb 4.8 oz)  BMI 28.40 kg/m2   GEN: Well nourished, well developed, in no acute distress HEENT: normal Neck: no JVD, carotid bruits, or masses Cardiac: RRR; no murmurs, rubs, or gallops, trivial right ankle edema  Respiratory:  clear to auscultation bilaterally, normal work of breathing GI: soft, nontender, nondistended, + BS MS: no deformity or atrophy Skin: warm and dry, no rash Neuro:  Alert and Oriented x 3, Strength and sensation are intact Psych: euthymic mood, full affect  Wt Readings from Last 3 Encounters:  01/26/16 70.444 kg (155 lb 4.8 oz)  11/28/15 69.967 kg (154 lb 4 oz)  10/24/15 67.132 kg (148 lb)      Studies/Labs Reviewed:   EKG:  EKG is not ordered today.    Recent Labs: 10/24/2015: Hemoglobin 13.3; Magnesium 2.0; Platelets 178 11/28/2015: ALT 20; BUN 10; Creat 0.63; Potassium 4.4; Sodium 138   Lipid Panel    Component Value Date/Time   CHOL 76* 10/24/2015 1453   TRIG 58  10/24/2015 1453   HDL 35* 10/24/2015 1453   CHOLHDL 2.2 10/24/2015 1453   VLDL 12 10/24/2015 1453   LDLCALC 29 10/24/2015 1453      ASSESSMENT:    1. Chronic diastolic heart failure ([place])   2. Coronary artery disease involving native coronary artery of native heart with angina pectoris ([place])   3. Bilateral carotid artery occlusion   4. AVNRT s/p successful RF ablation   5. H/O gastric bypass   6. Iron deficiency anemia   7. Depression      PLAN:  In order of problems listed above:  1. CHF: Clinically euvolemic, NYHA functional class I-II. She seems to be doing well despite the fact she is not taking any diuretics and is no longer on beta blockers either. She is substantially above her previously estimated dry weight, but has been steady at this weight now for about a year or longer. We will reestablish her "dry weight" at 150-155 pounds 2. CAD: No angina pectoris, asymptomatic. Low risk perfusion study and normal left ventricular ejection fraction by [facility] December 2015 3. Bilateral internal carotid artery occlusion: Possible recent amaurosis fugax. Now back on aspirin 4. No recurrence of SVT since ablation 5. 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 Anemia: Normal recent hemoglobin, requiring IV iron 7. Depression has improved. She is clearly more socially engaged and interested in her own health. 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 I think this newfound sense of purpose will help and also allows her to share her on grief. Advised that she not try coming off the antidepressant for about 6 months. When she does discontinue at it should be done gradually.    Medication Adjustments/Labs and Tests Ordered: Current medicines are reviewed at length with the patient today.  Concerns regarding medicines are outlined above.   Medication changes, Labs and Tests ordered today are listed in the Patient Instructions below. Patient Instructions  Dr.  Janice Seales recommends that you schedule a follow-up appointment in: [place], Esparanza Krider, MD  01/28/2016 7:56 PM    [address], [place], [place]  [address] Phone: [phone]; Fax: [phone]

## 2016-04-19 ENCOUNTER — Other Ambulatory Visit: Payer: Self-pay | Admitting: Physician Assistant

## 2016-04-19 ENCOUNTER — Encounter: Payer: Self-pay | Admitting: Family Medicine

## 2016-04-19 NOTE — Telephone Encounter (Signed)
Medication refill for one time only.  Patient needs to be seen.  Letter sent for patient to call and schedule 

## 2016-04-30 ENCOUNTER — Ambulatory Visit: Payer: Federal, State, Local not specified - PPO | Admitting: Physician Assistant

## 2016-05-08 ENCOUNTER — Encounter: Payer: Self-pay | Admitting: Physician Assistant

## 2016-05-09 ENCOUNTER — Ambulatory Visit (INDEPENDENT_AMBULATORY_CARE_PROVIDER_SITE_OTHER): Payer: Federal, State, Local not specified - PPO | Admitting: Physician Assistant

## 2016-05-09 ENCOUNTER — Encounter: Payer: Self-pay | Admitting: Physician Assistant

## 2016-05-09 VITALS — BP 116/64 | HR 60 | Temp 98.3°F | Resp 18 | Ht 62.0 in | Wt 146.0 lb

## 2016-05-09 DIAGNOSIS — Z9889 Other specified postprocedural states: Secondary | ICD-10-CM

## 2016-05-09 DIAGNOSIS — I25119 Atherosclerotic heart disease of native coronary artery with unspecified angina pectoris: Secondary | ICD-10-CM | POA: Diagnosis not present

## 2016-05-09 DIAGNOSIS — F329 Major depressive disorder, single episode, unspecified: Secondary | ICD-10-CM | POA: Diagnosis not present

## 2016-05-09 DIAGNOSIS — D509 Iron deficiency anemia, unspecified: Secondary | ICD-10-CM

## 2016-05-09 DIAGNOSIS — I471 Supraventricular tachycardia: Secondary | ICD-10-CM | POA: Diagnosis not present

## 2016-05-09 DIAGNOSIS — I6523 Occlusion and stenosis of bilateral carotid arteries: Secondary | ICD-10-CM

## 2016-05-09 DIAGNOSIS — F32A Depression, unspecified: Secondary | ICD-10-CM

## 2016-05-09 DIAGNOSIS — E43 Unspecified severe protein-calorie malnutrition: Secondary | ICD-10-CM | POA: Diagnosis not present

## 2016-05-09 DIAGNOSIS — Z9884 Bariatric surgery status: Secondary | ICD-10-CM

## 2016-05-09 MED ORDER — CITALOPRAM HYDROBROMIDE 40 MG PO TABS: 40.0000 mg | ORAL_TABLET | Freq: Every day | ORAL | Status: DC

## 2016-05-09 MED ORDER — CITALOPRAM HYDROBROMIDE 20 MG PO TABS: 20.0000 mg | ORAL_TABLET | Freq: Every day | ORAL | Status: DC

## 2016-05-09 NOTE — Progress Notes (Signed)
Patient ID: [name] MRN: [id number], DOB: October 29, 1951, 65 y.o. Date of Encounter: @DATE @  Chief Complaint:  Chief Complaint  Patient presents with  . Depression  F/U OV  HPI: 65 y.o. year old white female  presents for OV.   She says "04-02-23 was a difficult month for me". Says that is the month her husband died in past. Also this 04-02-23, her 3 cats all died. Has been [facility]. Says this is the 3rd time she has done [facility]--has gone after death of other family members in past. Says it has helped a lot. However, "does not think the Celexa is working". "Wants to switch the Celexa. "  Also says she has trouble sleeping.  Says last night she was up at 2am --sweeped, mopped, re-arranged her bedroom. Says she has been having this problem since ~ March.   Also says she "goes to bed, but just lays there, wide awake"----says that has been going on longer.   Also discusses ongoing GI symptoms.  Says this morning she was on her way to [facility], went through [employer] just to get something to drink, then realized she had eaten no breakfast, so got a sausage,egg biscuit. Says took one bite, barely got it down, and vomited it up.  Says she vomits fairly often.   I also reviewed that at prior visit she told me that she was having diarrhea at that time and frequently had problems with that.   Also reviewed chart--low calcium, low albumin, etc secondary to malabsorption.   She says she sees Dr. Paulita Fujita at [facility].  Says last time she saw him, "they just talked". Cant remember how long it has been since he did any tests/procedures or which tests/procedures have been done.    Past Medical History  Diagnosis Date  . Ischemic heart disease   . Diastolic dysfunction   . Chronic anemia   . History of recurrent UTIs   . Peroneal neuropathy   . Retinopathy   . Stroke ([place])   . Hypocalcemia   . Morbid obesity ([place])     s/p gastric bypass  . Depression   . CAD s/p CABG  1994 09/25/2013    LIMA-LAD, SVG Diag, SVG-RCA, [facility] Cath April 01, 2009: Stents in the proximal LAD with 40% in-stent restenosis and 50% post-stent stenosis, 80% small OM 1, 70% small OM 2, 40% first PLA; diffuse 70% distal RCA; patent SVG to diagonal, patent SVG to distal RCA, patent atretic LIMA EF 55%; normal nuclear stress test 04-01-2010  . Bilateral carotid artery occlusion 09/25/2013  . 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 Allergy   . Anxiety   . Asthma   . Cataract   . CHF (congestive heart failure) ([place])   . Diabetes mellitus without complication ([place])   . GERD (gastroesophageal reflux disease)   . Glaucoma   . Heart murmur   . Hypertension   . Hyperlipidemia   . Osteoporosis   . Arthritis      Home Meds: Outpatient Prescriptions Prior to Visit  Medication Sig Dispense Refill  . acetaminophen (TYLENOL) 500 MG tablet Take 1,500 mg by mouth every 6 (six) hours as needed for mild pain.     Marland Kitchen aspirin 81 MG tablet Take 81 mg by mouth daily.    Marland Kitchen esomeprazole (NEXIUM) 40 MG capsule Take 1 capsule (40 mg total) by mouth daily. 90 capsule 1  . morphine (AVINZA) 30 MG 24 hr capsule Take 1  capsule (30 mg total) by mouth every 8 (eight) hours. 42 capsule 0  . nitroGLYCERIN (NITROLINGUAL) 0.4 MG/SPRAY spray USE ONE TO TWO SPRAYS UNDER THE TONGUE EVERY 5 MINUTES AS NEEDED FOR CHEST PAIN 12 g 5  . Vitamin D, Ergocalciferol, (DRISDOL) 50000 UNITS CAPS capsule Take 1 capsule (50,000 Units total) by mouth every 7 (seven) days. 12 capsule 1  . citalopram (CELEXA) 40 MG tablet TAKE ONE TABLET BY MOUTH ONCE DAILY 30 tablet 0   No facility-administered medications prior to visit.    Allergies:  Allergies  Allergen Reactions  . Latex   . Metoclopramide Hcl   . Neurontin [Gabapentin]   . Penicillins     Social History   Social History  . Marital Status: Divorced    Spouse Name: N/A  . Number of Children: 2  . Years of Education: N/A   Occupational History  . disabled     Social History Main Topics  . Smoking status: Former Smoker    Quit date: 12/18/1987  . Smokeless tobacco: Never Used  . Alcohol Use: No  . Drug Use: No  . Sexual Activity: Not Currently   Other Topics Concern  . Not on file   Social History Narrative   She used to work with the  [employer]. 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    Family History  Problem Relation Age of Onset  . Diabetes Brother   . Asthma Brother   . Depression Brother   . Asthma Brother   . Depression Brother   . Diabetes Brother   . Cancer Father     skin  . Alcohol abuse Father   . Arthritis Father   . Depression Father   . Hypertension Father   . Diabetes Mother   . Heart failure Mother   . Arthritis Mother   . Heart disease Mother   . Asthma Sister   . Heart attack Sister   . Depression Sister   . Diabetes Sister   . Drug abuse Sister   . Heart disease Sister   . Mental illness Sister   . Heart attack Son   . Hypertension Son   . Kidney disease Son     transplant 2003  . Vision loss Son      Review of Systems:  See HPI for pertinent ROS. All other ROS negative.    Physical Exam: Blood pressure 116/64, pulse 60, temperature 98.3 F (36.8 C), temperature source Oral, resp. rate 18, height 5\' 2"  (1.575 m), weight 146 lb (66.225 kg)., Body mass index is 26.7 kg/(m^2). General: Somewhat pale, but ow WNWD WF. Appears in no acute distress. Neck: Supple. No thyromegaly. No lymphadenopathy. Lungs: Clear bilaterally to auscultation without wheezes, rales, or rhonchi. Breathing is unlabored. Heart: RRR with S1 S2. No murmurs, rubs, or gallops. Abdomen: Soft, non-tender, non-distended with normoactive bowel sounds. No hepatomegaly. No rebound/guarding. No obvious abdominal masses. Musculoskeletal:  Strength and tone normal for age. Extremities/Skin: Warm and dry. She has some LE edema. Neuro: Alert and oriented X 3. Moves all  extremities spontaneously. Gait is normal. CNII-XII grossly in tact. Psych:  Responds to questions appropriately with a normal affect.     ASSESSMENT AND PLAN:  65 y.o. year old female with   Depression Today she discussed whether I thought talking to a therapist would help. Discussed that with this, she knows the answer to this better than I cna tell. Discussed [facility] and  she says this helped a lot over the years. Today I did give her recommendation of [facility] --recommended she call them, also discussed checking with her insurance regarding coverage.   Wean off Celexa.  Take 40/40/20/40/40/20 for 1 week THEN Take 40/20/40/20      For 1 week THEN Take 20/20/40/20/20/40 for 1 week Then Take 20mg  daily for 1 week  Then Take 20mg  alternating with none   Schedule f/u with me 6 weeks.  If feels increase in depression or if feels any other increase symptoms at any point during this weaning process----call us immediately.   At f/u Ov 6 weeks--will re-assess Depression symptoms at that time, ---------------ALSO WILL REASSESS INSOMNIA symptoms at that time, once she is off Celexa and can ReAssess with a "[place]"   H/O gastric bypass Hypocalcemia Protein-calorie malnutrition, severe (HCC) Iron deficiency anemia  Today she signed release to get records from Dr. Paulita Fujita at [facility]-----will review his records to see what studies/procedures have been done and when.  Also will review his notes to see if he is aware of her symptoms of vomiting and her diarrhea. Also see if he is aware of her malabsorption, low calcium, low albumin, low iron etc. And if so, what treatments he has been recommending.    She will have f/u OV 6 weeks, sooner if needed.   [address] [place], [place], [facility] 05/09/2016 4:31 PM

## 2016-05-29 ENCOUNTER — Telehealth: Payer: Self-pay | Admitting: Physician Assistant

## 2016-05-29 NOTE — Telephone Encounter (Signed)
Call pt and tell her I received records from [facility].  Tell her to schedule OV with me to review records together, review her symptoms together, and discuss treatment plan.  Schedule for 30 minute appt please.

## 2016-06-01 NOTE — Telephone Encounter (Signed)
Pt called and appt made for next week.

## 2016-06-06 ENCOUNTER — Encounter: Payer: Self-pay | Admitting: Physician Assistant

## 2016-06-06 ENCOUNTER — Ambulatory Visit (INDEPENDENT_AMBULATORY_CARE_PROVIDER_SITE_OTHER): Payer: Federal, State, Local not specified - PPO | Admitting: Physician Assistant

## 2016-06-06 VITALS — BP 132/68 | HR 72 | Temp 98.2°F | Resp 16 | Ht 62.0 in | Wt 154.0 lb

## 2016-06-06 DIAGNOSIS — F329 Major depressive disorder, single episode, unspecified: Secondary | ICD-10-CM

## 2016-06-06 DIAGNOSIS — E43 Unspecified severe protein-calorie malnutrition: Secondary | ICD-10-CM | POA: Diagnosis not present

## 2016-06-06 DIAGNOSIS — K909 Intestinal malabsorption, unspecified: Secondary | ICD-10-CM

## 2016-06-06 DIAGNOSIS — R197 Diarrhea, unspecified: Secondary | ICD-10-CM | POA: Diagnosis not present

## 2016-06-06 DIAGNOSIS — Z9889 Other specified postprocedural states: Secondary | ICD-10-CM | POA: Diagnosis not present

## 2016-06-06 DIAGNOSIS — Z9884 Bariatric surgery status: Secondary | ICD-10-CM

## 2016-06-06 DIAGNOSIS — F32A Depression, unspecified: Secondary | ICD-10-CM

## 2016-06-06 DIAGNOSIS — I6523 Occlusion and stenosis of bilateral carotid arteries: Secondary | ICD-10-CM

## 2016-06-07 LAB — MAGNESIUM: Magnesium: 2 mg/dL (ref 1.5–2.5)

## 2016-06-07 LAB — COMPLETE METABOLIC PANEL WITH GFR
ALT: 19 U/L (ref 6–29)
AST: 21 U/L (ref 10–35)
Albumin: 3.3 g/dL — ABNORMAL LOW (ref 3.6–5.1)
Alkaline Phosphatase: 237 U/L — ABNORMAL HIGH (ref 33–130)
BUN: 9 mg/dL (ref 7–25)
CHLORIDE: 110 mmol/L (ref 98–110)
CO2: 23 mmol/L (ref 20–31)
CREATININE: 0.58 mg/dL (ref 0.50–0.99)
Calcium: 7 mg/dL — ABNORMAL LOW (ref 8.6–10.4)
GFR, Est Non African American: >89 mL/min (ref >=60)
GLUCOSE: 106 mg/dL — AB (ref 70–99)
Potassium: 4.3 mmol/L (ref 3.5–5.3)
Sodium: 140 mmol/L (ref 135–146)
Total Bilirubin: 0.5 mg/dL (ref 0.2–1.2)
Total Protein: 5.7 g/dL — ABNORMAL LOW (ref 6.1–8.1)

## 2016-06-07 LAB — PHOSPHORUS: PHOSPHORUS: 2.6 mg/dL (ref 2.1–4.3)

## 2016-06-07 LAB — CALCIUM, IONIZED: Calcium, Ion: 4.1 mg/dL — ABNORMAL LOW (ref 4.8–5.6)

## 2016-06-07 LAB — VITAMIN D 25 HYDROXY (VIT D DEFICIENCY, FRACTURES): Vit D, 25-Hydroxy: 8 ng/mL — ABNORMAL LOW (ref 30–100)

## 2016-06-07 NOTE — Progress Notes (Signed)
Patient ID: [name] MRN: [id number], DOB: 06-22-1951, 65 y.o. Date of Encounter: @DATE @  Chief Complaint:  Chief Complaint  Patient presents with  . OTHER    Go over records.   F/U OV  HPI: 65 y.o. year old white female  presents for OV.    05/09/2016 OV: She says "03/23/23 was a difficult month for me". Says that is the month her husband died in past. Also this Mar 23, 2023, her 3 cats all died. Has been going [facility]. Says this is the 3rd time she has done [facility]--has gone after death of other family members in past. Says it has helped a lot. However, "does not think the Celexa is working". "Wants to switch the Celexa. "  Also says she has trouble sleeping.  Says last night she was up at 2am --sweeped, mopped, re-arranged her bedroom. Says she has been having this problem since ~ March.   Also says she "goes to bed, but just lays there, wide awake"----says that has been going on longer.   Also discusses ongoing GI symptoms.  Says this morning she was on her way to [facility], went through [employer] just to get something to drink, then realized she had eaten no breakfast, so got a sausage,egg biscuit. Says took one bite, barely got it down, and vomited it up.  Says she vomits fairly often.   I also reviewed that at prior visit she told me that she was having diarrhea at that time and frequently had problems with that.   Also reviewed chart--low calcium, low albumin, etc secondary to malabsorption.   She says she sees Dr. Paulita Fujita at [place].  Says last time she saw him, "they just talked". Cant remember how long it has been since he did any tests/procedures or which tests/procedures have been done.   AT THAT VISIT: Wrote down for her how to dose Celexa to wean off of it (Was on 40mg  QD at [place] 66/24/17).  Had her sign release to get records from GI.  Planned to f/u to see how her insomnia and depression were off Celexa and then determine future treatment of  that.  06/06/2016: She is here to review GI records with me.  Also --regarding Celexa-- at [facility] I gave her a schedule of how to wean off Celexa. She is now currently taking 20mg  QD. (down from 40mg  QD at [facility]). Says she is sleepy during day --- takes nap for ~ 1 1/2 hours. Naps between 1 to 2 to 3 times a day---some days naps once, some days naps twice, some days even 3 times.  Says she is sleeping at night. Says that part is better. Is able to fall asleep at night now without difficulty. However, still often wakes ~ 2am, goes to BR but then is able to go right back to sleep. Sometimes may even wake again at ~ 4 am, go to BR, and go back to sleep.  Says she is not feeling depressed.   Says that she only vomits occasionally. Says that does not happen very frequently.  Says she does have diarrhea. Says could not afford Creon. Says she completed paperwork regarding her finances and turned into [place] but after that--only took off very small amount off of cost. Was still very expensive. Says Dr. Paulita Fujita gave her samples. Says she only took samples. Was never able to afford to pay for RX.   Reviewed GI records with her today (Dr. Paulita Fujita).  Procedures:  EGD---2008 Colonoscopy---05/24/2014:colon appeared normal. Biopsies were taken for eval of microscopic colitis.Patholgy report from biopsy: Normal.  Office Visit Notes: Regarding Vomiting: There is only one OV note regarding vomiting. 12/21/2013--- note stated "suspect gastroparesis in setting of prior gastric surgery as well as chronic use of narcotics for herniated back discs. Symptoms unchanged for years. UGI and EGD, done whil having symptoms, showed no evidence of anastomatic stenosis. Recent EGD likewise unrevealing. Pt fortunately doing well at this time." Decrease Nexium from BID to QD.  There are multiple OV notes regarding Diarrhea. Dates from 04/2014 - 10/2015.  Assessment: "Improvement with pancreatic enzymes and prior h/o duodenal  switch procedure for weight loss. 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 Her diarrhea persists, severl episodes per day, with urgency and at times nocturnal stools.  Trials of cholestyramine, loperamide, Xifaxan, probiotics, have not been helpful.   From note 07/26/2014: "Extensive stool studies, including for parasites and CDiff unrevealing"    Past Medical History  Diagnosis Date  . Ischemic heart disease   . Diastolic dysfunction   . Chronic anemia   . History of recurrent UTIs   . Peroneal neuropathy   . Retinopathy   . Stroke ([place])   . Hypocalcemia   . Morbid obesity ([place])     s/p gastric bypass  . Depression   . CAD s/p CABG 1994 09/25/2013    LIMA-LAD, SVG Diag, SVG-RCA, [facility] Cath 2010: Stents in the proximal LAD with 40% in-stent restenosis and 50% post-stent stenosis, 80% small OM 1, 70% small OM 2, 40% first PLA; diffuse 70% distal RCA; patent SVG to diagonal, patent SVG to distal RCA, patent atretic LIMA EF 55%; normal nuclear stress test 2011  . Bilateral carotid artery occlusion 09/25/2013  . 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 Allergy   . Anxiety   . Asthma   . Cataract   . CHF (congestive heart failure) ([place])   . Diabetes mellitus without complication ([place])   . GERD (gastroesophageal reflux disease)   . Glaucoma   . Heart murmur   . Hypertension   . Hyperlipidemia   . Osteoporosis   . Arthritis      Home Meds: Outpatient Prescriptions Prior to Visit  Medication Sig Dispense Refill  . acetaminophen (TYLENOL) 500 MG tablet Take 1,500 mg by mouth every 6 (six) hours as needed for mild pain.     Marland Kitchen aspirin 81 MG tablet Take 81 mg by mouth daily.    .  citalopram (CELEXA) 20 MG tablet Take 1 tablet (20 mg total) by mouth daily. 30 tablet 1  . esomeprazole (NEXIUM) 40 MG capsule Take 1 capsule (40 mg total) by mouth daily. 90 capsule 1  . morphine (AVINZA) 30 MG 24 hr capsule Take 1 capsule (30 mg total) by mouth every 8 (eight) hours. 42 capsule 0  . nitroGLYCERIN (NITROLINGUAL) 0.4 MG/SPRAY spray USE ONE TO TWO SPRAYS UNDER THE TONGUE EVERY 5 MINUTES AS NEEDED FOR CHEST PAIN 12 g 5  . Vitamin D, Ergocalciferol, (DRISDOL) 50000 UNITS CAPS capsule Take 1 capsule (50,000 Units total) by mouth every 7 (seven) days. 12 capsule 1  . citalopram (CELEXA) 40 MG tablet Take 1 tablet (40 mg total) by mouth daily. 30 tablet 0  No facility-administered medications prior to visit.    Allergies:  Allergies  Allergen Reactions  . Latex   . Metoclopramide Hcl   . Neurontin [Gabapentin]   . Penicillins     Social History   Social History  . Marital Status: Divorced    Spouse Name: N/A  . Number of Children: 2  . Years of Education: N/A   Occupational History  . disabled    Social History Main Topics  . Smoking status: Former Smoker    Quit date: 12/18/1987  . Smokeless tobacco: Never Used  . Alcohol Use: No  . Drug Use: No  . Sexual Activity: Not Currently   Other Topics Concern  . Not on file   Social History Narrative   She used to work with the  [employer]. 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    Family History  Problem Relation Age of Onset  . Diabetes Brother   . Asthma Brother   . Depression Brother   . Asthma Brother   . Depression Brother   . Diabetes Brother   . Cancer Father     skin  . Alcohol abuse Father   . Arthritis Father   . Depression Father   . Hypertension Father   . Diabetes Mother   . Heart failure Mother   . Arthritis Mother   . Heart disease Mother   . Asthma Sister   . Heart attack Sister   . Depression Sister   . Diabetes Sister    . Drug abuse Sister   . Heart disease Sister   . Mental illness Sister   . Heart attack Son   . Hypertension Son   . Kidney disease Son     transplant 2003  . Vision loss Son      Review of Systems:  See HPI for pertinent ROS. All other ROS negative.    Physical Exam: Blood pressure 132/68, pulse 72, temperature 98.2 F (36.8 C), temperature source Oral, resp. rate 16, height 5\' 2"  (1.575 m), weight 154 lb (69.854 kg)., Body mass index is 28.16 kg/(m^2). General: Somewhat pale, but ow WNWD WF. Appears in no acute distress. Neck: Supple. No thyromegaly. No lymphadenopathy. Lungs: Clear bilaterally to auscultation without wheezes, rales, or rhonchi. Breathing is unlabored. Heart: RRR with S1 S2. No murmurs, rubs, or gallops. Abdomen: Soft, non-tender, non-distended with normoactive bowel sounds. No hepatomegaly. No rebound/guarding. No obvious abdominal masses. Musculoskeletal:  Strength and tone normal for age. Extremities/Skin: Warm and dry. She has some LE edema. Neuro: Alert and oriented X 3. Moves all extremities spontaneously. Gait is normal. CNII-XII grossly in tact. Psych:  Responds to questions appropriately with a normal affect.     ASSESSMENT AND PLAN:  65 y.o. year old female with   I AM GOING TO HAVE KIM SEE IF [facility] OR SOMEONE CAN ASSIST IN GETTING PT CREON.  THIS IS THE ONLY THING THAT HAS WORKED. O/W SHE HAS DIARRHEA AND MALABSORPTION, CAUSING SIGNIFICANT HYPOCALCEMIA, ETC  1. H/O gastric bypass - COMPLETE METABOLIC PANEL WITH GFR - VITAMIN D 25 Hydroxy (Vit-D Deficiency, Fractures) - Magnesium - Calcium, ionized - Phosphorus  2. Hypocalcemia - COMPLETE METABOLIC PANEL WITH GFR - VITAMIN D 25 Hydroxy (Vit-D Deficiency, Fractures) - Calcium, ionized - Phosphorus  3. Protein-calorie malnutrition, severe ([place])  4. Depression Wean off Celexa. To take 20mg  QOD for 3 weeks.  Stop napping during day. Stay active so will not fall asleep.  5. Diarrhea due  to malabsorption - COMPLETE METABOLIC PANEL WITH GFR - VITAMIN D 25 Hydroxy (Vit-D Deficiency, Fractures) - Magnesium - Calcium, ionized - Phosphorus  F/U OV 1 month.  Sooner if needed. [name], [place], [facility] 06/07/2016 3:18 PM

## 2016-06-16 IMAGING — CR DG HIP COMPLETE 2+V*R*
2 series · 2 of 2 positions shown · non-contrast
Comparison: 03/12/2007

CLINICAL DATA: Right hip pain for several weeks without known
injury, initial encounter

EXAM:
RIGHT HIP - COMPLETE 2+ VIEW

[view not recorded (1 of 2)]
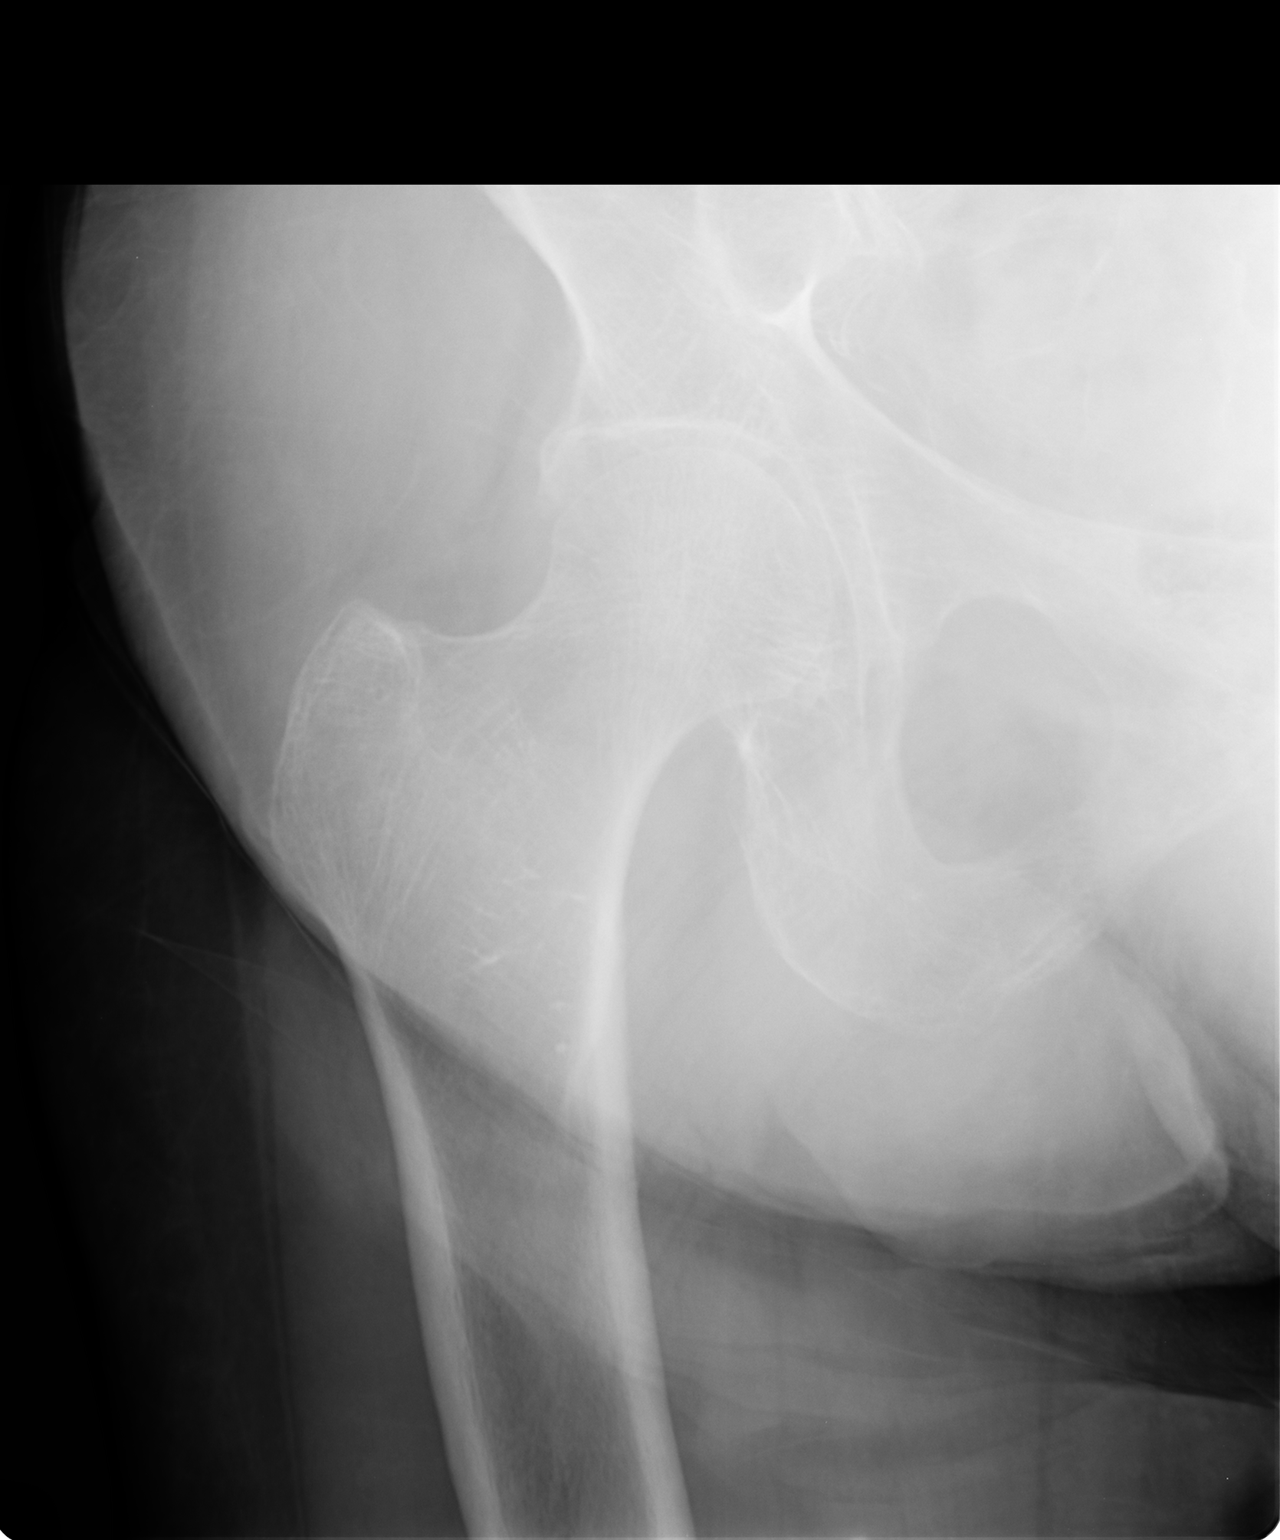

[view not recorded (2 of 2)]
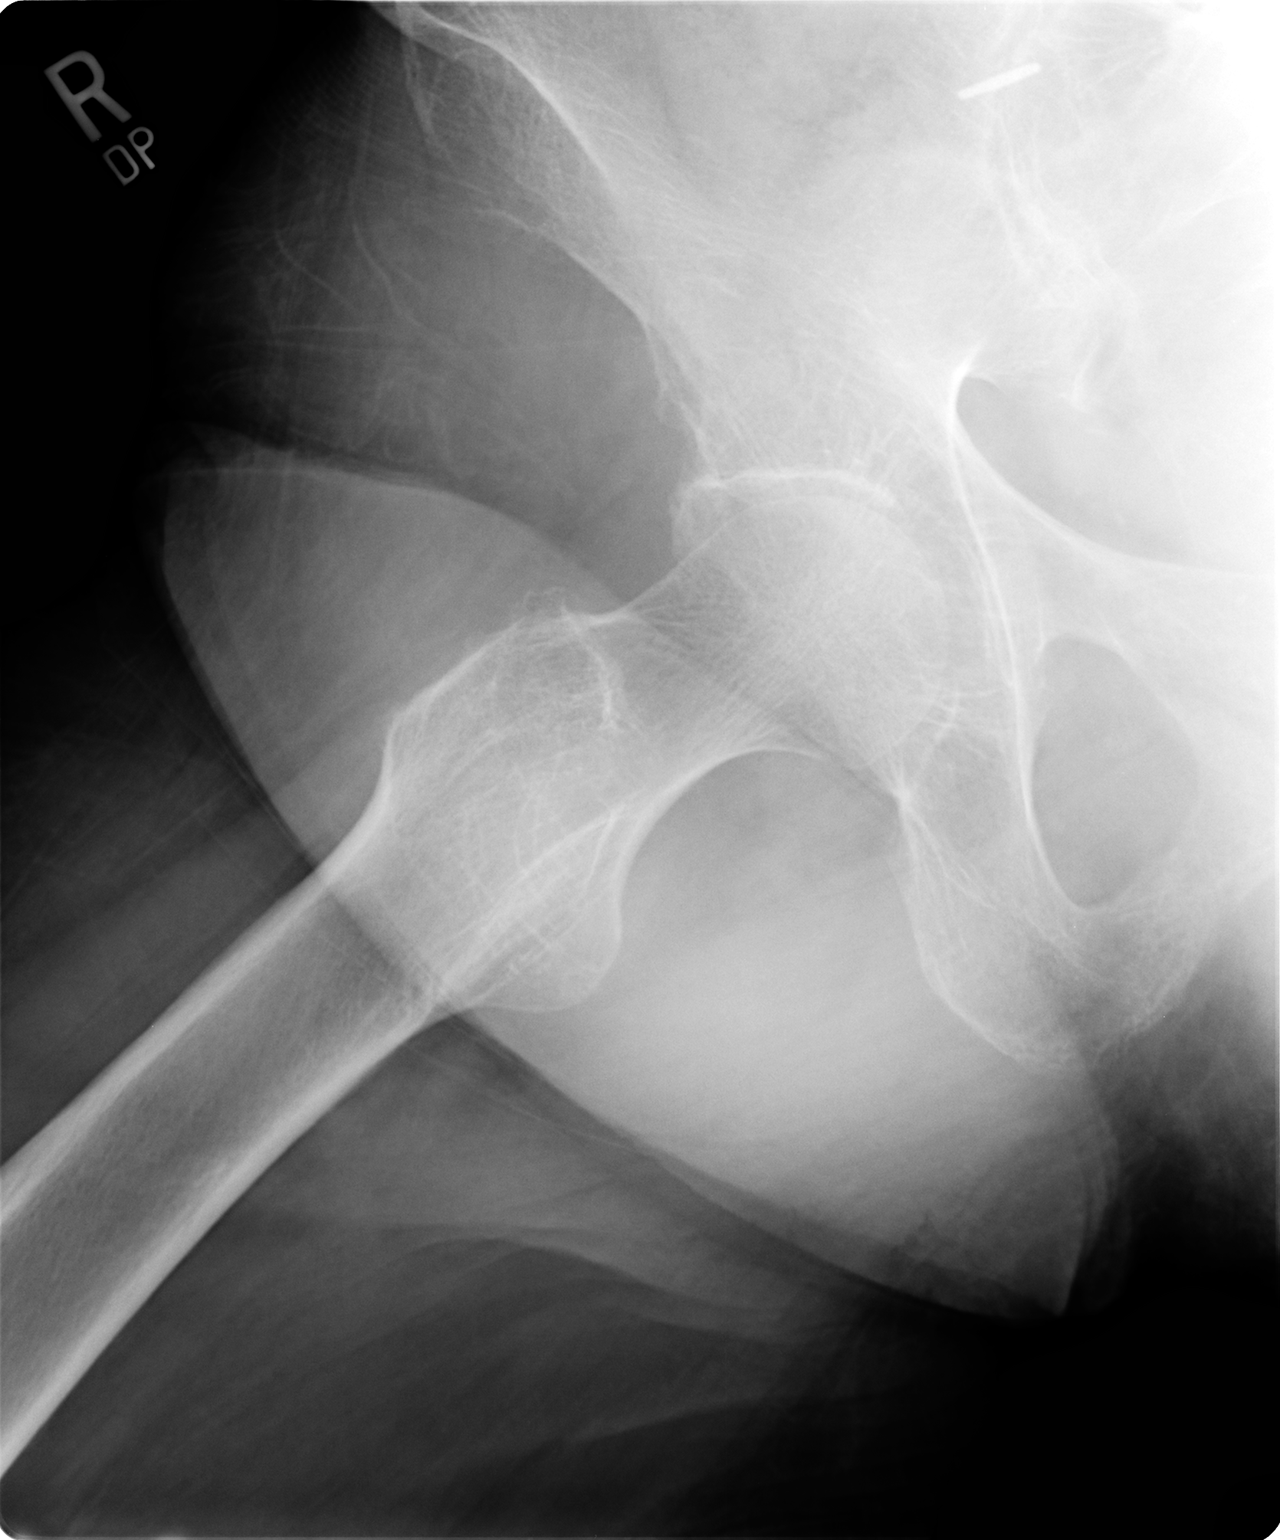

[2 of 2 positions shown; findings below may reference images not displayed]

FINDINGS: Degenerative changes of the right hip joint are noted. No acute
fracture or dislocation is seen. No gross soft tissue abnormality is
noted.
IMPRESSION: Degenerative change without acute abnormality.

## 2016-06-21 ENCOUNTER — Ambulatory Visit: Payer: Federal, State, Local not specified - PPO | Admitting: Physician Assistant

## 2016-06-26 ENCOUNTER — Telehealth: Payer: Self-pay | Admitting: Physician Assistant

## 2016-06-26 NOTE — Telephone Encounter (Signed)
Pt states that she has been weaned off of all of her antidepressants, but she is just not doing well off of them and is hoping that [facility] can put her back on one of the medications. Please advise. [phone]

## 2016-06-29 NOTE — Telephone Encounter (Signed)
Pt was upset had not been called yet.  I sincerely apologized that I had not returned her call.  Office has been very busy.  Told though that she would have to be seen to have antidepressant restart.  Providers do not order this from phone messages.  She was not happy.  Again I apologized.  Made appt for early Monday morning as pt going out of town on Tuesday.

## 2016-07-02 ENCOUNTER — Ambulatory Visit (INDEPENDENT_AMBULATORY_CARE_PROVIDER_SITE_OTHER): Payer: Federal, State, Local not specified - PPO | Admitting: Physician Assistant

## 2016-07-02 ENCOUNTER — Encounter: Payer: Self-pay | Admitting: Physician Assistant

## 2016-07-02 VITALS — BP 120/70 | Temp 98.3°F | Wt 149.0 lb

## 2016-07-02 DIAGNOSIS — F329 Major depressive disorder, single episode, unspecified: Secondary | ICD-10-CM | POA: Diagnosis not present

## 2016-07-02 DIAGNOSIS — I6523 Occlusion and stenosis of bilateral carotid arteries: Secondary | ICD-10-CM

## 2016-07-02 DIAGNOSIS — F32A Depression, unspecified: Secondary | ICD-10-CM

## 2016-07-02 MED ORDER — SERTRALINE HCL 50 MG PO TABS: 50.0000 mg | ORAL_TABLET | Freq: Every day | ORAL | Status: DC

## 2016-07-02 NOTE — Progress Notes (Signed)
Patient ID: ARISBET DETERDING MRN: [id number], DOB: 1951-02-18, 65 y.o. Date of Encounter: @DATE @  Chief Complaint:  Chief Complaint  Patient presents with  . OTHER    restart  antidepressant  F/U OV  HPI: 65 y.o. year old white female  presents for OV.    05/09/2016 OV: She says "2023-03-27 was a difficult month for me". Says that is the month her husband died in past. Also this 03-27-23, her 3 cats all died. Has been going to [facility]. Says this is the 3rd time she has done [facility]--has gone after death of other family members in past. Says it has helped a lot. However, "does not think the Celexa is working". "Wants to switch the Celexa. "  Also says she has trouble sleeping.  Says last night she was up at 2am --sweeped, mopped, re-arranged her bedroom. Says she has been having this problem since ~ March.   Also says she "goes to bed, but just lays there, wide awake"----says that has been going on longer.   Also discusses ongoing GI symptoms.  Says this morning she was on her way to [facility], went through [employer] just to get something to drink, then realized she had eaten no breakfast, so got a sausage,egg biscuit. Says took one bite, barely got it down, and vomited it up.  Says she vomits fairly often.   I also reviewed that at prior visit she told me that she was having diarrhea at that time and frequently had problems with that.   Also reviewed chart--low calcium, low albumin, etc secondary to malabsorption.   She says she sees Dr. Paulita Fujita at [place].  Says last time she saw him, "they just talked". Cant remember how long it has been since he did any tests/procedures or which tests/procedures have been done.   AT THAT VISIT: Wrote down for her how to dose Celexa to wean off of it (Was on 40mg  QD at [facility] 05/09/16).  Had her sign release to get records from GI.  Planned to f/u to see how her insomnia and depression were off Celexa and then determine future treatment of  that.  06/06/2016: She is here to review GI records with me.  Also --regarding Celexa-- at [place] I gave her a schedule of how to wean off Celexa. She is now currently taking 20mg  QD. (down from 40mg  QD at [facility]). Says she is sleepy during day --- takes nap for ~ 1 1/2 hours. Naps between 1 to 2 to 3 times a day---some days naps once, some days naps twice, some days even 3 times.  Says she is sleeping at night. Says that part is better. Is able to fall asleep at night now without difficulty. However, still often wakes ~ 2am, goes to BR but then is able to go right back to sleep. Sometimes may even wake again at ~ 4 am, go to BR, and go back to sleep.  Says she is not feeling depressed.   Says that she only vomits occasionally. Says that does not happen very frequently.  Says she does have diarrhea. Says could not afford Creon. Says she completed paperwork regarding her finances and turned into [place] but after that--only took off very small amount off of cost. Was still very expensive. Says Dr. Paulita Fujita gave her samples. Says she only took samples. Was never able to afford to pay for RX.   Reviewed GI records with her today (Dr. Paulita Fujita).  Procedures:  EGD---2008 Colonoscopy---05/24/2014:colon appeared normal. Biopsies were taken for eval of microscopic colitis.Patholgy report from biopsy: Normal.  Office Visit Notes: Regarding Vomiting: There is only one OV note regarding vomiting. 12/21/2013--- note stated "suspect gastroparesis in setting of prior gastric surgery as well as chronic use of narcotics for herniated back discs. Symptoms unchanged for years. UGI and EGD, done whil having symptoms, showed no evidence of anastomatic stenosis. Recent EGD likewise unrevealing. Pt fortunately doing well at this time." Decrease Nexium from BID to QD.  There are multiple OV notes regarding Diarrhea. Dates from 04/2014 - 10/2015.  Assessment: "Improvement with pancreatic enzymes and prior h/o duodenal  switch procedure for weight loss. 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 Her diarrhea persists, severl episodes per day, with urgency and at times nocturnal stools.  Trials of cholestyramine, loperamide, Xifaxan, probiotics, have not been helpful.   From note 07/26/2014: "Extensive stool studies, including for parasites and CDiff unrevealing"  07/02/2016: At last visit she was to wean off of Celexa because she did not feel that it was working. She has not been on any other medications in the past other than the Celexa. She says that she is feeling sad and depressed. Cries very easily.  No other complaints or concerns.  Past Medical History  Diagnosis Date  . Ischemic heart disease   . Diastolic dysfunction   . Chronic anemia   . History of recurrent UTIs   . Peroneal neuropathy   . Retinopathy   . Stroke ([place])   . Hypocalcemia   . Morbid obesity ([place])     s/p gastric bypass  . Depression   . CAD s/p CABG 1994 09/25/2013    LIMA-LAD, SVG Diag, SVG-RCA, [facility] Cath 2010: Stents in the proximal LAD with 40% in-stent restenosis and 50% post-stent stenosis, 80% small OM 1, 70% small OM 2, 40% first PLA; diffuse 70% distal RCA; patent SVG to diagonal, patent SVG to distal RCA, patent atretic LIMA EF 55%; normal nuclear stress test 2011  . Bilateral carotid artery occlusion 09/25/2013  . 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 Allergy   . Anxiety   . Asthma   . Cataract   . CHF (congestive heart failure) ([place])   . Diabetes mellitus without complication ([place])   . GERD (gastroesophageal reflux disease)   . Glaucoma   . Heart murmur   . Hypertension   . Hyperlipidemia   . Osteoporosis   .  Arthritis      Home Meds: Outpatient Prescriptions Prior to Visit  Medication Sig Dispense Refill  . acetaminophen (TYLENOL) 500 MG tablet Take 1,500 mg by mouth every 6 (six) hours as needed for mild pain.     Marland Kitchen aspirin 81 MG tablet Take 81 mg by mouth daily.    Marland Kitchen esomeprazole (NEXIUM) 40 MG capsule Take 1 capsule (40 mg total) by mouth daily. 90 capsule 1  . morphine (AVINZA) 30 MG 24 hr capsule Take 1 capsule (30 mg total) by mouth every 8 (eight) hours. 42 capsule 0  . nitroGLYCERIN (NITROLINGUAL) 0.4 MG/SPRAY spray USE ONE TO TWO SPRAYS UNDER THE TONGUE EVERY 5 MINUTES AS NEEDED FOR CHEST PAIN 12 g 5  . Vitamin D, Ergocalciferol, (DRISDOL) 50000 UNITS CAPS capsule Take 1  capsule (50,000 Units total) by mouth every 7 (seven) days. 12 capsule 1  . citalopram (CELEXA) 20 MG tablet Take 1 tablet (20 mg total) by mouth daily. (Patient not taking: Reported on 07/02/2016) 30 tablet 1   No facility-administered medications prior to visit.    Allergies:  Allergies  Allergen Reactions  . Latex   . Metoclopramide Hcl   . Neurontin [Gabapentin]   . Penicillins     Social History   Social History  . Marital Status: Divorced    Spouse Name: N/A  . Number of Children: 2  . Years of Education: N/A   Occupational History  . disabled    Social History Main Topics  . Smoking status: Former Smoker    Quit date: 12/18/1987  . Smokeless tobacco: Never Used  . Alcohol Use: No  . Drug Use: No  . Sexual Activity: Not Currently   Other Topics Concern  . Not on file   Social History Narrative   She used to work with the  [employer]. 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    Family History  Problem Relation Age of Onset  . Diabetes Brother   . Asthma Brother   . Depression Brother   . Asthma Brother   . Depression Brother   . Diabetes Brother   . Cancer Father     skin  . Alcohol abuse Father   . Arthritis Father   .  Depression Father   . Hypertension Father   . Diabetes Mother   . Heart failure Mother   . Arthritis Mother   . Heart disease Mother   . Asthma Sister   . Heart attack Sister   . Depression Sister   . Diabetes Sister   . Drug abuse Sister   . Heart disease Sister   . Mental illness Sister   . Heart attack Son   . Hypertension Son   . Kidney disease Son     transplant 2003  . Vision loss Son      Review of Systems:  See HPI for pertinent ROS. All other ROS negative.    Physical Exam: Blood pressure 120/70, temperature 98.3 F (36.8 C), weight 149 lb (67.586 kg)., Body mass index is 27.25 kg/(m^2). General: Somewhat pale, but ow WNWD WF. Appears in no acute distress. Neck: Supple. No thyromegaly. No lymphadenopathy. Lungs: Clear bilaterally to auscultation without wheezes, rales, or rhonchi. Breathing is unlabored. Heart: RRR with S1 S2. No murmurs, rubs, or gallops. Abdomen: Soft, non-tender, non-distended with normoactive bowel sounds. No hepatomegaly. No rebound/guarding. No obvious abdominal masses. Musculoskeletal:  Strength and tone normal for age. Extremities/Skin: Warm and dry. She has some LE edema. Neuro: Alert and oriented X 3. Moves all extremities spontaneously. Gait is normal. CNII-XII grossly in tact. Psych:  Responds to questions appropriately with a normal affect.     ASSESSMENT AND PLAN:  65 y.o. year old female with   1. Depression She will start Zoloft. If she has any adverse effects or feels that her depression is worsening or she starts having any suicidal ideation she is to stop the medication immediately and call me. Otherwise she will continue taking the medicine daily. Discussed that it will take time for this medication to start to take effect and we will wait for her to have follow-up office visit in 6 weeks unless she is having any problems follow-up sooner. She voices understanding and agrees. - sertraline (ZOLOFT) 50  MG tablet; Take 1 tablet (50  mg total) by mouth daily.  Dispense: 30 tablet; Refill: 3     THE FOLLOWING IS COPIED FORM OV NOTE 06/06/2016 AND NOT ADDRESSED AT OV 07/02/2016:  I AM GOING TO HAVE KIM SEE IF [facility] OR SOMEONE CAN ASSIST IN GETTING PT CREON.  THIS IS THE ONLY THING THAT HAS WORKED. O/W SHE HAS DIARRHEA AND MALABSORPTION, CAUSING SIGNIFICANT HYPOCALCEMIA, ETC  1. H/O gastric bypass - COMPLETE METABOLIC PANEL WITH GFR - VITAMIN D 25 Hydroxy (Vit-D Deficiency, Fractures) - Magnesium - Calcium, ionized - Phosphorus  2. Hypocalcemia - COMPLETE METABOLIC PANEL WITH GFR - VITAMIN D 25 Hydroxy (Vit-D Deficiency, Fractures) - Calcium, ionized - Phosphorus  3. Protein-calorie malnutrition, severe ([place])    Diarrhea due to malabsorption - COMPLETE METABOLIC PANEL WITH GFR - VITAMIN D 25 Hydroxy (Vit-D Deficiency, Fractures) - Magnesium - Calcium, ionized - Phosphorus  Signed, [address] [place], [place], [facility] 07/02/2016 5:06 PM

## 2016-08-13 ENCOUNTER — Ambulatory Visit (INDEPENDENT_AMBULATORY_CARE_PROVIDER_SITE_OTHER): Payer: Federal, State, Local not specified - PPO | Admitting: Physician Assistant

## 2016-08-13 ENCOUNTER — Encounter: Payer: Self-pay | Admitting: Physician Assistant

## 2016-08-13 VITALS — BP 120/68 | HR 78 | Temp 98.3°F | Wt 148.0 lb

## 2016-08-13 DIAGNOSIS — F329 Major depressive disorder, single episode, unspecified: Secondary | ICD-10-CM

## 2016-08-13 DIAGNOSIS — F32A Depression, unspecified: Secondary | ICD-10-CM

## 2016-08-13 DIAGNOSIS — G4700 Insomnia, unspecified: Secondary | ICD-10-CM

## 2016-08-13 MED ORDER — SERTRALINE HCL 100 MG PO TABS: 100.0000 mg | ORAL_TABLET | Freq: Every day | ORAL | 1 refills | Status: DC

## 2016-08-13 MED ORDER — TEMAZEPAM 30 MG PO CAPS: 30.0000 mg | ORAL_CAPSULE | Freq: Every evening | ORAL | 0 refills | Status: DC | PRN

## 2016-08-13 MED ORDER — ALBUTEROL SULFATE HFA 108 (90 BASE) MCG/ACT IN AERS: 2.0000 | INHALATION_SPRAY | Freq: Four times a day (QID) | RESPIRATORY_TRACT | 0 refills | Status: DC | PRN

## 2016-08-13 NOTE — Progress Notes (Signed)
Patient ID: [name] MRN: [id number], DOB: 05-Jun-1951, 65 y.o. Date of Encounter: @DATE @  Chief Complaint:  Chief Complaint  Patient presents with  . Follow-up    depression/ not sleeping/ shortness of breath. PPD-20  F/U OV  HPI: 65 y.o. year old white female  presents for OV.    05/09/2016 OV: She says "2023/03/25 was a difficult month for me". Says that is the month her husband died in past. Also this 03/25/23, her 3 cats all died. Has been going to [facility]. Says this is the 3rd time she has done [facility]--has gone after death of other family members in past. Says it has helped a lot. However, "does not think the Celexa is working". "Wants to switch the Celexa. "  Also says she has trouble sleeping.  Says last night she was up at 2am --sweeped, mopped, re-arranged her bedroom. Says she has been having this problem since ~ March.   Also says she "goes to bed, but just lays there, wide awake"----says that has been going on longer.   Also discusses ongoing GI symptoms.  Says this morning she was on her way to [facility], went through [employer] just to get something to drink, then realized she had eaten no breakfast, so got a sausage,egg biscuit. Says took one bite, barely got it down, and vomited it up.  Says she vomits fairly often.   I also reviewed that at prior visit she told me that she was having diarrhea at that time and frequently had problems with that.   Also reviewed chart--low calcium, low albumin, etc secondary to malabsorption.   She says she sees Dr. Paulita Fujita at [facility].  Says last time she saw him, "they just talked". Cant remember how long it has been since he did any tests/procedures or which tests/procedures have been done.   AT THAT VISIT: Wrote down for her how to dose Celexa to wean off of it (Was on 40mg  QD at [facility] 05/09/16).  Had her sign release to get records from GI.  Planned to f/u to see how her insomnia and depression were off Celexa and then  determine future treatment of that.  06/06/2016: She is here to review GI records with me.  Also --regarding Celexa-- at [place] I gave her a schedule of how to wean off Celexa. She is now currently taking 20mg  QD. (down from 40mg  QD at [facility]). Says she is sleepy during day --- takes nap for ~ 1 1/2 hours. Naps between 1 to 2 to 3 times a day---some days naps once, some days naps twice, some days even 3 times.  Says she is sleeping at night. Says that part is better. Is able to fall asleep at night now without difficulty. However, still often wakes ~ 2am, goes to BR but then is able to go right back to sleep. Sometimes may even wake again at ~ 4 am, go to BR, and go back to sleep.  Says she is not feeling depressed.   Says that she only vomits occasionally. Says that does not happen very frequently.  Says she does have diarrhea. Says could not afford Creon. Says she completed paperwork regarding her finances and turned into [place] but after that--only took off very small amount off of cost. Was still very expensive. Says Dr. Paulita Fujita gave her samples. Says she only took samples. Was never able to afford to pay for RX.   Reviewed GI records with her today (Dr.  Outlaw).  Procedures:  EGD---2008 Colonoscopy---05/24/2014:colon appeared normal. Biopsies were taken for eval of microscopic colitis.Patholgy report from biopsy: Normal.  Office Visit Notes: Regarding Vomiting: There is only one OV note regarding vomiting. 12/21/2013--- note stated "suspect gastroparesis in setting of prior gastric surgery as well as chronic use of narcotics for herniated back discs. Symptoms unchanged for years. UGI and EGD, done whil having symptoms, showed no evidence of anastomatic stenosis. Recent EGD likewise unrevealing. Pt fortunately doing well at this time." Decrease Nexium from BID to QD.  There are multiple OV notes regarding Diarrhea. Dates from 04/2014 - 10/2015.  Assessment: "Improvement with pancreatic  enzymes and prior h/o duodenal switch procedure for weight loss. 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 Her diarrhea persists, severl episodes per day, with urgency and at times nocturnal stools.  Trials of cholestyramine, loperamide, Xifaxan, probiotics, have not been helpful.   From note 07/26/2014: "Extensive stool studies, including for parasites and CDiff unrevealing"  07/02/2016: At last visit she was to wean off of Celexa because she did not feel that it was working. She has not been on any other medications in the past other than the Celexa. She says that she is feeling sad and depressed. Cries very easily.  No other complaints or concerns. AT THAT OV: --Started Zoloft 50mg  QD  08/13/2016: She says that so far she cannot see any effects from the Zoloft. Still feels quite depressed. Teary eyed throughout the visit. Says that she goes to the grocery store and goes to her grief share once a week (@[facility] --I told her that my children went to that daycare) and goes to church once a week. Says that's about it but also says that that's about as much as she was getting out even prior to feeling depressed. Talked about church but she says that she really doesn't feel like she gets much out of that (goes to a different church--not [facility]) and does not feel that she gets much out of the sermons and does not get much care from the pastor. Also says that she can't sleep at night. She just lays there with her why eyes wide open in a stare. Does not nap during the day. Has not used medication for sleep in the past. Says that she needs to clean out her son's house and needs to take care of that but she just can't deal with it.  Feels like it is finalizing things even though she doesn't know what she thinks it would be finalizing as he has passed away 2 years ago.   Past Medical History:  Diagnosis Date  . Allergy   . Anxiety   . Arthritis   . Asthma   . 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 Bilateral carotid artery occlusion 09/25/2013  . CAD s/p CABG 1994 09/25/2013   LIMA-LAD, SVG Diag, SVG-RCA, [facility] 2010: Stents in the proximal LAD with 40% in-stent restenosis and 50% post-stent stenosis, 80% small OM 1, 70% small OM 2, 40% first PLA; diffuse 70% distal RCA; patent SVG to diagonal, patent SVG to distal RCA, patent atretic LIMA EF 55%; normal nuclear stress test 2011  . Cataract   . CHF (congestive heart failure) ([place])   . Chronic anemia   .  Depression   . Diabetes mellitus without complication ([place])   . Diastolic dysfunction   . GERD (gastroesophageal reflux disease)   . Glaucoma   . Heart murmur   . History of recurrent UTIs   . Hyperlipidemia   . Hypertension   . Hypocalcemia   . Ischemic heart disease   . Morbid obesity ([place])    s/p gastric bypass  . Osteoporosis   . Peroneal neuropathy   . Retinopathy   . Stroke [facility])      Home Meds: Outpatient Medications Prior to Visit  Medication Sig Dispense Refill  . aspirin 81 MG tablet Take 81 mg by mouth daily.    Marland Kitchen esomeprazole (NEXIUM) 40 MG capsule Take 1 capsule (40 mg total) by mouth daily. 90 capsule 1  . morphine (AVINZA) 30 MG 24 hr capsule Take 1 capsule (30 mg total) by mouth every 8 (eight) hours. 42 capsule 0  . nitroGLYCERIN (NITROLINGUAL) 0.4 MG/SPRAY spray USE ONE TO TWO SPRAYS UNDER THE TONGUE EVERY 5 MINUTES AS NEEDED FOR CHEST PAIN 12 g 5  . sertraline (ZOLOFT) 50 MG tablet Take 1 tablet (50 mg total) by mouth daily. 30 tablet 3  . acetaminophen (TYLENOL) 500 MG tablet Take 1,500 mg by mouth every 6 (six) hours as needed for mild pain.     . Vitamin D, Ergocalciferol, (DRISDOL) 50000 UNITS CAPS capsule Take 1  capsule (50,000 Units total) by mouth every 7 (seven) days. (Patient not taking: Reported on 08/13/2016) 12 capsule 1   No facility-administered medications prior to visit.     Allergies:  Allergies  Allergen Reactions  . Latex   . Metoclopramide Hcl   . Neurontin [Gabapentin]   . Penicillins     Social History   Social History  . Marital status: Divorced    Spouse name: N/A  . Number of children: 2  . Years of education: N/A   Occupational History  . disabled    Social History Main Topics  . Smoking status: Former Smoker    Quit date: 12/18/1987  . Smokeless tobacco: Never Used  . Alcohol use No  . Drug use: No  . Sexual activity: Not Currently   Other Topics Concern  . Not on file   Social History Narrative   She used to work with the  [employer]. 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    Family History  Problem Relation Age of Onset  . Diabetes Brother   . Asthma Brother   . Depression Brother   . Asthma Brother   . Depression Brother   . Diabetes Brother   . Cancer Father     skin  . Alcohol abuse Father   . Arthritis Father   . Depression Father   . Hypertension Father   . Diabetes Mother   . Heart failure Mother   . Arthritis Mother   . Heart disease Mother   . Asthma Sister   . Heart attack Sister   . Depression Sister   . Diabetes Sister   . Drug abuse Sister   . Heart disease Sister   . Mental illness Sister   . Heart attack Son   . Hypertension Son   . Kidney disease Son     transplant 2003  . Vision loss Son      Review of Systems:  See HPI for pertinent ROS. All other ROS negative.    Physical Exam: Blood pressure 120/68, pulse  78, temperature 98.3 F (36.8 C), temperature source Oral, weight 148 lb (67.1 kg), SpO2 97 %., Body mass index is 27.07 kg/m. General: Somewhat pale, but ow WNWD WF. Appears in no acute distress. Neck: Supple. No thyromegaly. No  lymphadenopathy. Lungs: Clear bilaterally to auscultation without wheezes, rales, or rhonchi. Breathing is unlabored. Heart: RRR with S1 S2. No murmurs, rubs, or gallops. Abdomen: Soft, non-tender, non-distended with normoactive bowel sounds. No hepatomegaly. No rebound/guarding. No obvious abdominal masses. Musculoskeletal:  Strength and tone normal for age. Extremities/Skin: Warm and dry. She has some LE edema. Neuro: Alert and oriented X 3. Moves all extremities spontaneously. Gait is normal. CNII-XII grossly in tact. Psych:  Responds to questions appropriately with a normal affect.     ASSESSMENT AND PLAN:  65 y.o. year old female with    1. Depression Will increase Zoloft from 50 mg to 100 mg. That unfortunately it will take time for this to be effective.  Will have her go ahead and have follow-up visit in 4 weeks to see if she is seeing any effectiveness with increased dose. - sertraline (ZOLOFT) 100 MG tablet; Take 1 tablet (100 mg total) by mouth daily.  Dispense: 30 tablet; Refill: 1  2. Insomnia Will have her use Restoril at night and see if this helps with insomnia. Told her to try this for several nights but then if it is not effective for her go ahead and call me and we will try something different. - temazepam (RESTORIL) 30 MG capsule; Take 1 capsule (30 mg total) by mouth at bedtime as needed for sleep.  Dispense: 30 capsule; Refill: 0     THE FOLLOWING IS COPIED FORM OV NOTE 06/06/2016 AND NOT ADDRESSED AT OV 07/02/2016, 08/13/2016:  I AM GOING TO HAVE KIM SEE IF [facility] OR SOMEONE CAN ASSIST IN GETTING PT CREON.  THIS IS THE ONLY THING THAT HAS WORKED. O/W SHE HAS DIARRHEA AND MALABSORPTION, CAUSING SIGNIFICANT HYPOCALCEMIA, ETC  1. H/O gastric bypass - COMPLETE METABOLIC PANEL WITH GFR - VITAMIN D 25 Hydroxy (Vit-D Deficiency, Fractures) - Magnesium - Calcium, ionized - Phosphorus  2. Hypocalcemia - COMPLETE METABOLIC PANEL WITH GFR - VITAMIN D 25 Hydroxy (Vit-D  Deficiency, Fractures) - Calcium, ionized - Phosphorus  3. Protein-calorie malnutrition, severe ([place])    Diarrhea due to malabsorption - COMPLETE METABOLIC PANEL WITH GFR - VITAMIN D 25 Hydroxy (Vit-D Deficiency, Fractures) - Magnesium - Calcium, ionized - Phosphorus  Signed, [address] [place], [place], [facility] 08/13/2016 3:04 PM

## 2016-09-06 ENCOUNTER — Ambulatory Visit: Payer: Federal, State, Local not specified - PPO | Admitting: Physician Assistant

## 2016-09-12 ENCOUNTER — Ambulatory Visit: Payer: Medicare Other | Admitting: Physician Assistant

## 2016-09-18 LAB — HM DIABETES EYE EXAM

## 2016-10-04 ENCOUNTER — Encounter: Payer: Self-pay | Admitting: Family Medicine

## 2016-12-24 ENCOUNTER — Encounter: Payer: Self-pay | Admitting: Physician Assistant

## 2016-12-24 ENCOUNTER — Ambulatory Visit (INDEPENDENT_AMBULATORY_CARE_PROVIDER_SITE_OTHER): Payer: Federal, State, Local not specified - PPO | Admitting: Physician Assistant

## 2016-12-24 VITALS — BP 120/62 | HR 73 | Temp 98.4°F | Resp 16 | Wt 142.2 lb

## 2016-12-24 DIAGNOSIS — E1165 Type 2 diabetes mellitus with hyperglycemia: Secondary | ICD-10-CM

## 2016-12-24 DIAGNOSIS — F321 Major depressive disorder, single episode, moderate: Secondary | ICD-10-CM | POA: Diagnosis not present

## 2016-12-24 DIAGNOSIS — IMO0001 Reserved for inherently not codable concepts without codable children: Secondary | ICD-10-CM | POA: Insufficient documentation

## 2016-12-24 MED ORDER — SERTRALINE HCL 50 MG PO TABS: ORAL_TABLET | ORAL | 0 refills | Status: DC

## 2016-12-24 MED ORDER — BLOOD GLUCOSE MONITOR KIT: PACK | 0 refills | Status: DC

## 2016-12-24 MED ORDER — METFORMIN HCL 500 MG PO TABS: 500.0000 mg | ORAL_TABLET | Freq: Two times a day (BID) | ORAL | 3 refills | Status: DC

## 2016-12-24 MED ORDER — GLIPIZIDE ER 10 MG PO TB24: 10.0000 mg | ORAL_TABLET | Freq: Every day | ORAL | 2 refills | Status: DC

## 2016-12-24 MED ORDER — GLUCOSE BLOOD VI STRP: 1.0000 | ORAL_STRIP | Freq: Two times a day (BID) | 12 refills | Status: DC

## 2016-12-24 NOTE — Progress Notes (Signed)
Patient ID: [name] MRN: [id number], DOB: [birth year], 66 y.o. Date of Encounter: '@DATE'$ @  Chief Complaint:  Chief Complaint  Patient presents with  . Depression  . a1c check  F/U OV  HPI: 66 y.o. year old white female  presents for OV.    05/09/2016 OV: She says "April 03, 2023 was a difficult month for me". Says that is the month her husband died in past. Also this 2023-04-03, her 3 cats all died. Has been going to [facility]. Says this is the 3rd time she has done [facility]--has gone after death of other family members in past. Says it has helped a lot. However, "does not think the Celexa is working". "Wants to switch the Celexa. "  Also says she has trouble sleeping.  Says last night she was up at 2am --sweeped, mopped, re-arranged her bedroom. Says she has been having this problem since ~ March.   Also says she "goes to bed, but just lays there, wide awake"----says that has been going on longer.   Also discusses ongoing GI symptoms.  Says this morning she was on her way to [facility], went through [employer] just to get something to drink, then realized she had eaten no breakfast, so got a sausage,egg biscuit. Says took one bite, barely got it down, and vomited it up.  Says she vomits fairly often.   I also reviewed that at prior visit she told me that she was having diarrhea at that time and frequently had problems with that.   Also reviewed chart--low calcium, low albumin, etc secondary to malabsorption.   She says she sees Dr. Paulita Fujita at [place].  Says last time she saw him, "they just talked". Cant remember how long it has been since he did any tests/procedures or which tests/procedures have been done.   AT THAT VISIT: Wrote down for her how to dose Celexa to wean off of it (Was on '40mg'$  QD at [place] 05/09/16).  Had her sign release to get records from GI.  Planned to f/u to see how her insomnia and depression were off Celexa and then determine future treatment of  that.  06/06/2016: She is here to review GI records with me.  Also --regarding Celexa-- at [place] I gave her a schedule of how to wean off Celexa. She is now currently taking '20mg'$  QD. (down from '40mg'$  QD at [facility]). Says she is sleepy during day --- takes nap for ~ 1 1/2 hours. Naps between 1 to 2 to 3 times a day---some days naps once, some days naps twice, some days even 3 times.  Says she is sleeping at night. Says that part is better. Is able to fall asleep at night now without difficulty. However, still often wakes ~ 2am, goes to BR but then is able to go right back to sleep. Sometimes may even wake again at ~ 4 am, go to BR, and go back to sleep.  Says she is not feeling depressed.   Says that she only vomits occasionally. Says that does not happen very frequently.  Says she does have diarrhea. Says could not afford Creon. Says she completed paperwork regarding her finances and turned into [place] but after that--only took off very small amount off of cost. Was still very expensive. Says Dr. Paulita Fujita gave her samples. Says she only took samples. Was never able to afford to pay for RX.   Reviewed GI records with her today (Dr. Paulita Fujita).  Procedures:  EGD---2008 Colonoscopy---05/24/2014:colon  appeared normal. Biopsies were taken for eval of microscopic colitis.Patholgy report from biopsy: Normal.  Office Visit Notes: Regarding Vomiting: There is only one OV note regarding vomiting. 12/21/2013--- note stated "suspect gastroparesis in setting of prior gastric surgery as well as chronic use of narcotics for herniated back discs. Symptoms unchanged for years. UGI and EGD, done whil having symptoms, showed no evidence of anastomatic stenosis. Recent EGD likewise unrevealing. Pt fortunately doing well at this time." Decrease Nexium from BID to QD.  There are multiple OV notes regarding Diarrhea. Dates from 04/2014 - 10/2015.  Assessment: "Improvement with pancreatic enzymes and prior h/o duodenal  switch procedure for weight loss. 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 Her diarrhea persists, severl episodes per day, with urgency and at times nocturnal stools.  Trials of cholestyramine, loperamide, Xifaxan, probiotics, have not been helpful.   From note 07/26/2014: "Extensive stool studies, including for parasites and CDiff unrevealing"  07/02/2016: At last visit she was to wean off of Celexa because she did not feel that it was working. She has not been on any other medications in the past other than the Celexa. She says that she is feeling sad and depressed. Cries very easily.  No other complaints or concerns. AT THAT OV: --Started Zoloft '50mg'$  QD  08/13/2016: She says that so far she cannot see any effects from the Zoloft. Still feels quite depressed. Teary eyed throughout the visit. Says that she goes to the grocery store and goes to her grief share once a week ('@[facility]'$ --I told her that my children went to that daycare) and goes to church once a week. Says that's about it but also says that that's about as much as she was getting out even prior to feeling depressed. Talked about church but she says that she really doesn't feel like she gets much out of that (goes to a different church--not [facility]) and does not feel that she gets much out of the sermons and does not get much care from the pastor. Also says that she can't sleep at night. She just lays there with her why eyes wide open in a stare. Does not nap during the day. Has not used medication for sleep in the past. Says that she needs to clean out her son's house and needs to take care of that but she just can't deal with it. Feels like it is finalizing  things even though she doesn't know what she thinks it would be finalizing as he has passed away 2 years ago. 1. Depression Will increase Zoloft from 50 mg to 100 mg. That unfortunately it will take time for this to be effective.  Will have her go ahead and have follow-up visit in 4 weeks to see if she is seeing any effectiveness with increased dose. - sertraline (ZOLOFT) 100 MG tablet; Take 1 tablet (100 mg total) by mouth daily.  Dispense: 30 tablet; Refill: 1  2. Insomnia Will have her use Restoril at night and see if this helps with insomnia. Told her to try this for several nights but then if it is not effective for her go ahead and call me and we will try something different. - temazepam (RESTORIL) 30 MG capsule; Take 1 capsule (30 mg total) by mouth  at bedtime as needed for sleep.  Dispense: 30 capsule; Refill: 0   12/24/2016: Today her brother is with her for visit. He has never been with her for a visit before. She says that he lives 5 minutes away and is going to help get her back on track. She states that she did not realize that sertraline was Zoloft and did not realize that sertraline was for her depression and so she has quit that. She states that she has continued to feel depressed. She also states that she had noticed some changes with her vision so she saw an eye doctor. Also brings in lab result from going to a [facility] on 12/19/16 at which time A1c was 9.4. She had no prior diagnosis of diabetes. [facility] did not start her in on any kind of medication for diabetes. Just told her to follow-up here.      Past Medical History:  Diagnosis Date  . Allergy   . Anxiety   . Arthritis   . Asthma   . 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 Bilateral carotid artery occlusion 09/25/2013  . CAD s/p CABG 1994 09/25/2013   LIMA-LAD, SVG Diag, SVG-RCA, [facility] 2010: Stents in the proximal LAD with 40% in-stent restenosis and 50% post-stent stenosis, 80%  small OM 1, 70% small OM 2, 40% first PLA; diffuse 70% distal RCA; patent SVG to diagonal, patent SVG to distal RCA, patent atretic LIMA EF 55%; normal nuclear stress test 2011  . Cataract   . CHF (congestive heart failure) (HCC)   . Chronic anemia   . Depression   . Diabetes mellitus without complication (HCC)   . Diastolic dysfunction   . GERD (gastroesophageal reflux disease)   . Glaucoma   . Heart murmur   . History of recurrent UTIs   . Hyperlipidemia   . Hypertension   . Hypocalcemia   . Ischemic heart disease   . Morbid obesity (HCC)    s/p gastric bypass  . Osteoporosis   . Peroneal neuropathy   . Retinopathy   . Stroke [facility])      Home Meds: Outpatient Medications Prior to Visit  Medication Sig Dispense Refill  . acetaminophen (TYLENOL) 500 MG tablet Take 1,500 mg by mouth every 6 (six) hours as needed for mild pain.     Marland Kitchen albuterol (PROVENTIL HFA;VENTOLIN HFA) 108 (90 Base) MCG/ACT inhaler Inhale 2 puffs into the lungs every 6 (six) hours as needed for wheezing or shortness of breath. (Patient not taking: Reported on 12/24/2016) 1 Inhaler 0  . aspirin 81 MG tablet Take 81 mg by mouth daily.    Marland Kitchen esomeprazole (NEXIUM) 40 MG capsule Take 1 capsule (40 mg total) by mouth daily. (Patient not taking: Reported on 12/24/2016) 90 capsule 1  . morphine (AVINZA) 30 MG 24 hr capsule Take 1 capsule (30 mg total) by mouth every 8 (eight) hours. (Patient not taking: Reported on 12/24/2016) 42 capsule 0  . nitroGLYCERIN (NITROLINGUAL) 0.4 MG/SPRAY spray USE ONE TO TWO SPRAYS UNDER THE TONGUE EVERY 5 MINUTES AS NEEDED FOR CHEST PAIN (Patient not taking: Reported on 12/24/2016) 12 g 5  . sertraline (ZOLOFT) 100 MG tablet Take 1 tablet (100 mg total) by mouth daily. (Patient not taking: Reported on 12/24/2016) 30 tablet 1  . temazepam (RESTORIL) 30 MG capsule Take 1 capsule (30 mg total) by mouth at bedtime as needed for sleep. (Patient not taking: Reported on 12/24/2016) 30 capsule 0  . Vitamin D,  Ergocalciferol, (DRISDOL) 50000 UNITS CAPS capsule Take 1 capsule (50,000 Units total) by mouth every 7 (seven) days. (Patient not taking: Reported on 12/24/2016) 12 capsule 1   No facility-administered medications prior to visit.     Allergies:  Allergies  Allergen Reactions  . Latex   . Metoclopramide Hcl   . Neurontin [Gabapentin]   . Penicillins     Social History   Social History  . Marital status: Divorced    Spouse name: N/A  . Number of children: 2  . Years of education: N/A   Occupational History  . disabled    Social History Main Topics  . Smoking status: Former Smoker    Quit date: 12/18/1987  . Smokeless tobacco: Never Used  . Alcohol use No  . Drug use: No  . Sexual activity: Not Currently   Other Topics Concern  . Not on file   Social History Narrative   She used to work with the  [employer]. 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    Family History  Problem Relation Age of Onset  . Diabetes Brother   . Asthma Brother   . Depression Brother   . Asthma Brother   . Depression Brother   . Diabetes Brother   . Cancer Father     skin  . Alcohol abuse Father   . Arthritis Father   . Depression Father   . Hypertension Father   . Diabetes Mother   . Heart failure Mother   . Arthritis Mother   . Heart disease Mother   . Asthma Sister   . Heart attack Sister   . Depression Sister   . Diabetes Sister   . Drug abuse Sister   . Heart disease Sister   . Mental illness Sister   . Heart attack Son   . Hypertension Son   . Kidney disease Son     transplant 2003  . Vision loss Son      Review of Systems:  See HPI for pertinent ROS. All other ROS negative.    Physical Exam: Blood pressure 120/62, pulse 73, temperature 98.4 F (36.9 C), temperature source Oral, resp. rate 16, weight 142 lb 3.2 oz (64.5 kg), SpO2 98 %., Body mass index is 26.01 kg/m. General: Somewhat pale, but ow WNWD WF.  Appears in no acute distress. Neck: Supple. No thyromegaly. No lymphadenopathy. Lungs: Clear bilaterally to auscultation without wheezes, rales, or rhonchi. Breathing is unlabored. Heart: RRR with S1 S2. No murmurs, rubs, or gallops. Abdomen: Soft, non-tender, non-distended with normoactive bowel sounds. No hepatomegaly. No rebound/guarding. No obvious abdominal masses. Musculoskeletal:  Strength and tone normal for age. Extremities/Skin: Warm and dry. She has some LE edema. Neuro: Alert and oriented X 3. Moves all extremities spontaneously. Gait is normal. CNII-XII grossly in tact. Psych:  Responds to questions appropriately with a normal affect.     ASSESSMENT AND PLAN:  66 y.o. year old female with   1. Uncontrolled type 2 diabetes mellitus without complication, without long-term current use of insulin ([place]) I mentioned that metformin is usually a medicines started for diabetes but also sometimes causes diarrhea acid given her diarrhea would not use this. Her brother then says that he does not think she's having diarrhea anymore and we confirmed with her that she states that she has not been having diarrhea. I'll ahead and start metformin but will start out with a low dose for now. We'll start with the  metformin 500 mg twice a day. We'll also start Glucotrol XL 10 mg daily. Giving her meter and supplies for her to check blood sugar. Well I have given her blood sugar log form and showed her how to fill this out. Told her to just check fasting morning blood sugar and document that every morning. She is to schedule follow-up office visit with me in 3 weeks. - metFORMIN (GLUCOPHAGE) 500 MG tablet; Take 1 tablet (500 mg total) by mouth 2 (two) times daily with a meal.  Dispense: 60 tablet; Refill: 3 - glipiZIDE (GLUCOTROL XL) 10 MG 24 hr tablet; Take 1 tablet (10 mg total) by mouth daily with breakfast.  Dispense: 30 tablet; Refill: 2 - blood glucose meter kit and supplies KIT; Dispense based  on patient and insurance preference.  Check blood sugar twice daily as directed.  Dispense: 1 each; Refill: 0 - glucose blood test strip; 1 each by Other route 2 (two) times daily. Dispense based on Insurance and patient preference.  Check blood sugars twice daily as instructed.  Dispense: 100 each; Refill: 12  2. Moderate single current episode of major depressive disorder ([place]) Last visit was supposed to be increasing Zoloft from 50 mg up to 100 mg. At this time will have her take 50 mg for 2 weeks and then go up to 100 mg. At next visit we'll send a prescription for the 100 mg dose and remove the 50 mg dose from her med list. - sertraline (ZOLOFT) 50 MG tablet; Take one daily for 2 weeks then increase to 2 daily.  Dispense: 60 tablet; Refill: 0  3. Uncontrolled type 2 diabetes mellitus with hyperglycemia, without long-term current use of insulin (HCC) - blood glucose meter kit and supplies KIT; Dispense based on patient and insurance preference.  Check blood sugar twice daily as directed.  Dispense: 1 each; Refill: 0 - glucose blood test strip; 1 each by Other route 2 (two) times daily. Dispense based on Insurance and patient preference.  Check blood sugars twice daily as instructed.  Dispense: 100 each; Refill: 85  Brother is to come with her to her next visit.  They are to return for follow-up visit in 3 weeks.  Bring the blood sugar log sheet with him to that visit.  If she is tolerating metformin with no diarrhea then will increase that to 1000 mg twice a day.  Will change the Zoloft from 50 mg to 100 mg on med list.  Will review her log sheet and see if we need to further adjust diabetic medicine.   THE FOLLOWING IS COPIED FORM OV NOTE 06/06/2016 AND NOT ADDRESSED AT OV 07/02/2016, 08/13/2016, 12/24/2016:  I AM GOING TO HAVE KIM SEE IF [facility] OR SOMEONE CAN ASSIST IN GETTING PT CREON.  THIS IS THE ONLY THING THAT HAS WORKED. O/W SHE HAS DIARRHEA AND MALABSORPTION, CAUSING SIGNIFICANT  HYPOCALCEMIA, ETC  1. H/O gastric bypass - COMPLETE METABOLIC PANEL WITH GFR - VITAMIN D 25 Hydroxy (Vit-D Deficiency, Fractures) - Magnesium - Calcium, ionized - Phosphorus  2. Hypocalcemia - COMPLETE METABOLIC PANEL WITH GFR - VITAMIN D 25 Hydroxy (Vit-D Deficiency, Fractures) - Calcium, ionized - Phosphorus  3. Protein-calorie malnutrition, severe ([place])    Diarrhea due to malabsorption - COMPLETE METABOLIC PANEL WITH GFR - VITAMIN D 25 Hydroxy (Vit-D Deficiency, Fractures) - Magnesium - Calcium, ionized - Phosphorus  Signed, [id number] [place], [place], [facility] 12/24/2016 4:58 PM

## 2017-01-14 ENCOUNTER — Ambulatory Visit: Payer: Federal, State, Local not specified - PPO | Admitting: Physician Assistant

## 2017-01-21 ENCOUNTER — Other Ambulatory Visit: Payer: Self-pay | Admitting: General Surgery

## 2017-01-21 DIAGNOSIS — K219 Gastro-esophageal reflux disease without esophagitis: Secondary | ICD-10-CM

## 2017-01-24 ENCOUNTER — Other Ambulatory Visit: Payer: Self-pay | Admitting: Physician Assistant

## 2017-01-24 ENCOUNTER — Other Ambulatory Visit: Payer: Federal, State, Local not specified - PPO

## 2017-01-24 DIAGNOSIS — F321 Major depressive disorder, single episode, moderate: Secondary | ICD-10-CM

## 2017-01-24 NOTE — Telephone Encounter (Signed)
Rx filled per protocol  

## 2017-01-25 ENCOUNTER — Ambulatory Visit
Admission: RE | Admit: 2017-01-25 | Discharge: 2017-01-25 | Disposition: A | Discharge status: Home / Self Care | Payer: Medicare Other | Source: Ambulatory Visit | Attending: General Surgery | Admitting: General Surgery

## 2017-01-25 DIAGNOSIS — K219 Gastro-esophageal reflux disease without esophagitis: Secondary | ICD-10-CM

## 2017-03-02 ENCOUNTER — Other Ambulatory Visit: Payer: Self-pay | Admitting: Physician Assistant

## 2017-03-02 DIAGNOSIS — F321 Major depressive disorder, single episode, moderate: Secondary | ICD-10-CM

## 2017-03-04 NOTE — Telephone Encounter (Signed)
Refill appropriate 

## 2017-03-25 ENCOUNTER — Encounter: Payer: Self-pay | Admitting: Physician Assistant

## 2017-04-01 ENCOUNTER — Other Ambulatory Visit: Payer: Self-pay | Admitting: Physician Assistant

## 2017-04-01 DIAGNOSIS — F321 Major depressive disorder, single episode, moderate: Secondary | ICD-10-CM

## 2017-04-01 DIAGNOSIS — E1165 Type 2 diabetes mellitus with hyperglycemia: Principal | ICD-10-CM

## 2017-04-01 DIAGNOSIS — IMO0001 Reserved for inherently not codable concepts without codable children: Secondary | ICD-10-CM

## 2017-04-02 NOTE — Telephone Encounter (Signed)
Refill appropriate 

## 2017-04-15 ENCOUNTER — Encounter: Payer: Self-pay | Admitting: Neurology

## 2017-04-15 ENCOUNTER — Encounter (INDEPENDENT_AMBULATORY_CARE_PROVIDER_SITE_OTHER): Payer: Self-pay

## 2017-04-15 ENCOUNTER — Ambulatory Visit (INDEPENDENT_AMBULATORY_CARE_PROVIDER_SITE_OTHER): Payer: Federal, State, Local not specified - PPO | Admitting: Neurology

## 2017-04-15 VITALS — BP 151/68 | HR 73 | Ht 62.0 in | Wt 143.4 lb

## 2017-04-15 DIAGNOSIS — I638 Other cerebral infarction: Secondary | ICD-10-CM | POA: Diagnosis not present

## 2017-04-15 DIAGNOSIS — G4719 Other hypersomnia: Secondary | ICD-10-CM

## 2017-04-15 DIAGNOSIS — R5382 Chronic fatigue, unspecified: Secondary | ICD-10-CM | POA: Diagnosis not present

## 2017-04-15 DIAGNOSIS — R413 Other amnesia: Secondary | ICD-10-CM

## 2017-04-15 DIAGNOSIS — I6389 Other cerebral infarction: Secondary | ICD-10-CM

## 2017-04-15 DIAGNOSIS — F0390 Unspecified dementia without behavioral disturbance: Secondary | ICD-10-CM | POA: Diagnosis not present

## 2017-04-15 DIAGNOSIS — R0683 Snoring: Secondary | ICD-10-CM | POA: Diagnosis not present

## 2017-04-15 NOTE — Patient Instructions (Addendum)
Remember to drink plenty of fluid, eat healthy meals and do not skip any meals. Try to eat protein with a every meal and eat a healthy snack such as fruit or nuts in between meals. Try to keep a regular sleep-wake schedule and try to exercise daily, particularly in the form of walking, 20-30 minutes a day, if you can.   As far as diagnostic testing: MRI brain, lab, Sleep evaluation  I would like to see you back in 3 months, sooner if we need to. Please call us with any interim questions, concerns, problems, updates or refill requests.   Our phone number is [phone]. We also have an after hours call service for urgent matters and there is a physician on-call for urgent questions. For any emergencies you know to call 911 or go to the nearest emergency room

## 2017-04-15 NOTE — Progress Notes (Signed)
[facility]    Provider:  Dr [name] Referring Provider: Stevenson Clinch MD Primary Care Physician:  Paula Clinch MD  CC:  Memory loss  HPI:  [name] is a 66 y.o. female here as a referral from Dr. Doren Sloan for memory changes. Past medical history depression, anxiety, bilateral carotid artery occlusion, coronary artery disease status post CABG 1994, congestive heart failure, chronic anemia, diabetes, glaucoma, hyperlipidemia, hypertension, morbid obesity status post gastric bypass, stroke, severe protein calorie malnutrition, multiple cardiac problems.   Gradual memory loss for 2-3 years. Brother is here and provides much information. She forgets dates. She forgets to get her medicine. Forgets to take her medicine. More short-term memory changes pe rpatient. Brother says lately she has forgotten some people temporarily but then remembered them. She forgets names. All her bills are paid electronically but has missed bills. She lives alone independently. She fell recently unclear why. She still cooks. Brother came to the house and seemed normal.  Brother notices her repeating things sometimes, asks when her appointments are multiple times. She is having difficulty knowing the date. She also feels depression is involved as well but she feels she has memory problems not related to the depression. Her father had Alzheimer's in his 65s. She had bariatric surgery and stopped taking meds and all her levels were low.She snores, she has woken herself up by stopping breathing and she is very tired.   Reviewed notes, labs and imaging from outside physicians, which showed:  Reviewed primary care notes from [place]. [name] has been a hard month for her because this is the month her husband died in the past. She is also headache her animals die in [name]. She has been receiving grief therapy. She also has trouble sleeping. She has trouble initiating sleep. She is on  Celexa because she feels sad and depressed, cries very easily. She was on Zoloft in the past as well. She is a former smoker. No alcohol use, no drug use. Never used smokeless tobacco. She also has ongoing GI problems.  Reviewed notes from you and [facility]. 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 Montral cognitive assessment score was 20 out of 30. She was started on donepezil. Patient has a history of memory loss. She often doesn't remember places she has been. Her brother and sister. are concerned with this. Her father had Alzheimer's dementia.   HEAD CT WITHOUT CONTRAST:  Reviewed images and agree with the following   Findings: Chronic atrophy and gliosis in the right frontal lobe appears the same. This could be ischemic or post-traumatic. The remainder of the brain shows some old small vessel insults in the deep white matter. No sign of acute stroke, mass, hemorrhage, hydrocephalus, or extra-axial collection. Sinuses, middle ears and mastoids are clear. Patient has sebaceous cysts of the scalp which are chronic findings.  IMPRESSION:   No change. No acute finding. Old right frontal stroke. Small-vessel changes elsewhere throughout the brain.   Review of Systems: Patient complains of symptoms per HPI as well as the following symptoms: no CP, no SOB, snoring. Pertinent negatives per HPI. All others negative.   Social History   Social History  . Marital status: Divorced    Spouse name: N/A  . Number of children: 2  . Years of education: N/A   Occupational History  . disabled    Social History Main Topics  . Smoking status: Former Smoker    Quit date: 12/18/1987  .  Smokeless tobacco: Never Used  . Alcohol use No  . Drug use: No  . Sexual activity: Not Currently   Other Topics Concern  . Not on file   Social History Narrative   She used to work with the  [employer]. 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 Therefore knows a lot of medical terminology.   Lives at home w/ her little dog   Right-handed   Caffeine: occasional tea    Family History  Problem Relation Age of Onset  . Diabetes Brother   . Asthma Brother   . Depression Brother   . Asthma Brother   . Depression Brother   . Diabetes Brother   . Cancer Father     skin  . Alcohol abuse Father   . Arthritis Father   . Depression Father   . Hypertension Father   . Alzheimer's disease Father   . Diabetes Mother   . Heart failure Mother   . Arthritis Mother   . Heart disease Mother   . Asthma Sister   . Heart attack Sister   . Depression Sister   . Diabetes Sister   . Drug abuse Sister   . Heart disease Sister   . Mental illness Sister   . Heart attack Son   . Hypertension Son   . Kidney disease Son     transplant 2003  . Vision loss Son     Past Medical History:  Diagnosis Date  . Allergy   . Anxiety   . Arthritis   . Asthma   . 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 Bilateral carotid artery occlusion 09/25/2013  . CAD s/p CABG 1994 09/25/2013   LIMA-LAD, SVG Diag, SVG-RCA, [facility] 2010: Stents in the proximal LAD with 40% in-stent restenosis and 50% post-stent stenosis, 80% small OM 1, 70% small OM 2, 40% first PLA; diffuse 70% distal RCA; patent SVG to diagonal, patent SVG to distal RCA, patent atretic LIMA EF 55%; normal nuclear stress test 2011  . Cataract   . CHF (congestive heart failure) ([place])   . Chronic anemia   . Depression   . Diabetes mellitus without complication ([place])   . Diastolic dysfunction   . GERD (gastroesophageal reflux disease)   . Glaucoma   . Heart murmur   . History of recurrent UTIs   . Hyperlipidemia   . Hypertension   . Hypocalcemia   . Ischemic heart disease   . Migraine   . Morbid obesity ([place])    s/p gastric bypass  . Osteoporosis   . Peroneal neuropathy   . Retinopathy   . Stroke [facility])     Past Surgical History:  Procedure Laterality Date    . APPENDECTOMY    . CARDIAC CATHETERIZATION  03/30/2009   3 vessel CAD,patent grafts  . CHOLECYSTECTOMY    . CORONARY ARTERY BYPASS GRAFT  1994   LIM to LAD,SVG to first diagonal,SVG to distal RCA  . GASTRIC BYPASS  2002  . HEEL SPUR EXCISION  2012  . HERNIA REPAIR    . NM MYOCAR PERF WALL MOTION  2011   no ischemia  . RADIOFREQUENCY ABLATION     AVNRT - successful  . SMALL INTESTINE SURGERY    . TOTAL HIP ARTHROPLASTY Right 12/27/2014   Procedure: TOTAL HIP ARTHROPLASTY ANTERIOR APPROACH;  Surgeon: Alta Corning, MD;  Location: [facility];  Service: Orthopedics;  Laterality: Right;  . TRIGGER FINGER RELEASE  2012  . US ECHOCARDIOGRAPHY  05/09/2010   mild MR,mild mitral and aortic sclerosis  . VAGINAL HYSTERECTOMY    . VENTRAL HERNIA REPAIR      Current Outpatient Prescriptions  Medication Sig Dispense Refill  . acetaminophen (TYLENOL) 500 MG tablet Take 1,500 mg by mouth every 6 (six) hours as needed for mild pain.     Marland Kitchen albuterol (PROVENTIL HFA;VENTOLIN HFA) 108 (90 Base) MCG/ACT inhaler Inhale 2 puffs into the lungs every 6 (six) hours as needed for wheezing or shortness of breath. 1 Inhaler 0  . aspirin 81 MG tablet Take 81 mg by mouth daily.    . blood glucose meter kit and supplies KIT Dispense based on patient and insurance preference.  Check blood sugar twice daily as directed. 1 each 0  . esomeprazole (NEXIUM) 40 MG capsule Take 1 capsule (40 mg total) by mouth daily. (Patient taking differently: Take 40 mg by mouth daily as needed. ) 90 capsule 1  . GLIPIZIDE XL 10 MG 24 hr tablet TAKE ONE TABLET BY MOUTH ONCE DAILY WITH  BREAKFAST 90 tablet 2  . glucose blood test strip 1 each by Other route 2 (two) times daily. Dispense based on Insurance and patient preference.  Check blood sugars twice daily as instructed. 100 each 12  . metFORMIN (GLUCOPHAGE) 500 MG tablet Take 1 tablet (500 mg total) by mouth 2 (two) times daily with a meal. 60 tablet 3  . morphine (MS CONTIN) 30 MG 12 hr  tablet Take 30 mg by mouth daily as needed.    . nitroGLYCERIN (NITROLINGUAL) 0.4 MG/SPRAY spray USE ONE TO TWO SPRAYS UNDER THE TONGUE EVERY 5 MINUTES AS NEEDED FOR CHEST PAIN 12 g 5  . sertraline (ZOLOFT) 50 MG tablet TAKE 1 TABLET BY MOUTH ONCE DAILY FOR 2 WEEKS, THEN INCREASE TO 2 TABLETS DAILY 60 tablet 0  . Vitamin D, Ergocalciferol, (DRISDOL) 50000 UNITS CAPS capsule Take 1 capsule (50,000 Units total) by mouth every 7 (seven) days. 12 capsule 1   No current facility-administered medications for this visit.     Allergies as of 04/15/2017 - Review Complete 04/15/2017  Allergen Reaction Noted  . Latex  03/30/2014  . Metoclopramide hcl  04/08/2012  . Neurontin [gabapentin]  04/08/2012  . Penicillins  04/08/2012    Vitals: BP (!) 151/68   Pulse 73   Ht 5' 2" (1.575 m)   Wt 143 lb 6.4 oz (65 kg)   BMI 26.23 kg/m  Last Weight:  Wt Readings from Last 1 Encounters:  04/15/17 143 lb 6.4 oz (65 kg)   Last Height:   Ht Readings from Last 1 Encounters:  04/15/17 5' 2" (1.575 m)   Physical exam: Exam: Gen: NAD, conversant, well nourised, obese, well groomed                     CV: RRR, no MRG. No Carotid Bruits. No peripheral edema, warm, nontender Eyes: Conjunctivae clear without exudates or hemorrhage  Neuro: Detailed Neurologic Exam  Speech:    Speech is normal; fluent and spontaneous with normal comprehension.  Cognition:  MMSE - Mini Mental State Exam 04/15/2017  Orientation to time 4  Orientation to Place 5  Registration 3  Attention/ Calculation 5  Recall 1  Language- name 2 objects 2  Language- repeat 1  Language- follow 3 step command 3  Language- read & follow direction 1  Write a sentence 1  Copy design 1  Total score 27      The patient is  oriented to person, place, and time;     recent and remote memory intact;     language fluent;     normal attention, concentration,     fund of knowledge Cranial Nerves:    The pupils are equal, round, and  reactive to light. The fundi are normal and spontaneous venous pulsations are present. Visual fields are full to finger confrontation. Extraocular movements are intact. Trigeminal sensation is intact and the muscles of mastication are normal. The face is symmetric. The palate elevates in the midline. Hearing intact. Voice is normal. Shoulder shrug is normal. The tongue has normal motion without fasciculations.   Coordination:    Normal finger to nose and heel to shin. Normal rapid alternating movements.   Gait:    Heel-toe and tandem gait are normal.   Motor Observation:    No asymmetry, no atrophy, and no involuntary movements noted. Tone:    Normal muscle tone.    Posture:    Posture is normal. normal erect    Strength:    Strength is V/V in the upper and lower limbs.      Sensation: intact to LT     Reflex Exam:  DTR's:    Deep tendon reflexes in the upper and lower extremities are normal bilaterally.   Toes:    The toes are downgoing bilaterally.   Clonus:    Clonus is absent.       Assessment/Plan:   66 y.o. female here as a referral from Dr. Doren Sloan for memory changes. Past medical history depression, anxiety, bilateral carotid artery occlusion, coronary artery disease status post CABG 1994, congestive heart failure, chronic anemia, diabetes, glaucoma, hyperlipidemia, hypertension, morbid obesity status post gastric bypass, stroke, severe protein calorie malnutrition, multiple cardiac problems.  Need an MRI of the brain to evaluate for vascular causes of dementia including stroke given patient's previous history of strokes and multiple risk factors as well as reversible causes of dementia. Will check thyroid, rpr and b12 normal Recommend formal neuropsychiatric testing to evaluate for pseudodementia after sleep evaluation OSA testing for her very loud snoring and daytime fatigue to see if there is any OSA accounting for symptoms.  Orders Placed This Encounter  Procedures   . MR BRAIN WO CONTRAST  . TSH  . Ambulatory referral to Sleep Studies   Cc: Paula Clinch MD  Sarina Ill, MD  [facility] [address] [place], [place] [address]  Phone [phone] Fax [phone]

## 2017-04-16 ENCOUNTER — Encounter: Payer: Self-pay | Admitting: Neurology

## 2017-04-16 LAB — TSH: TSH: 0.358 u[IU]/mL — ABNORMAL LOW (ref 0.450–4.500)

## 2017-04-18 ENCOUNTER — Telehealth: Payer: Self-pay | Admitting: *Deleted

## 2017-04-18 NOTE — Telephone Encounter (Signed)
Pt's brother returned RN's call. He said new PCP is [facility] [phone]  [phone]

## 2017-04-18 NOTE — Telephone Encounter (Signed)
Re-faxed to correct PCP

## 2017-04-18 NOTE — Telephone Encounter (Signed)
Patient called office in reference lab results and them being faxed to [facility] [facility] office.  Per patient that is not her PCP she does not know the name of her PCP and asked that we please contact her brother [name] (listed on DPR).

## 2017-04-18 NOTE — Telephone Encounter (Signed)
Per Dr Jaynee Eagles, LVM informing patient that her TSH is slightly in the hyperthyroid range. She likely needs further labs. Advised pt this RN will forward to her pcp, Orlena Sheldon. Advised patient to follow up there.  Left number for any questions. TSH result faxed to Or[facility]a Sheldon, [place].

## 2017-04-18 NOTE — Telephone Encounter (Signed)
Called pt's brother and asked that he call back if Dena Billet is not pt's current PCP.

## 2017-04-22 ENCOUNTER — Encounter: Payer: Self-pay | Admitting: Physician Assistant

## 2017-04-25 ENCOUNTER — Ambulatory Visit
Admission: RE | Admit: 2017-04-25 | Discharge: 2017-04-25 | Disposition: A | Discharge status: Home / Self Care | Payer: Medicare Other | Source: Ambulatory Visit | Attending: Neurology | Admitting: Neurology

## 2017-04-25 DIAGNOSIS — R413 Other amnesia: Secondary | ICD-10-CM

## 2017-05-01 ENCOUNTER — Telehealth: Payer: Self-pay | Admitting: *Deleted

## 2017-05-01 NOTE — Telephone Encounter (Signed)
LVM informing patient her MRI brain results show no significant changes on brain imaging since 2009. Repeated result and left number for any questions.

## 2017-05-09 ENCOUNTER — Ambulatory Visit (INDEPENDENT_AMBULATORY_CARE_PROVIDER_SITE_OTHER): Payer: Federal, State, Local not specified - PPO | Admitting: Cardiovascular Disease

## 2017-05-09 ENCOUNTER — Encounter: Payer: Self-pay | Admitting: Cardiovascular Disease

## 2017-05-09 VITALS — BP 128/66 | HR 74 | Ht 62.0 in | Wt 146.0 lb

## 2017-05-09 DIAGNOSIS — I2510 Atherosclerotic heart disease of native coronary artery without angina pectoris: Secondary | ICD-10-CM

## 2017-05-09 DIAGNOSIS — I5032 Chronic diastolic (congestive) heart failure: Secondary | ICD-10-CM

## 2017-05-09 DIAGNOSIS — I471 Supraventricular tachycardia: Secondary | ICD-10-CM | POA: Diagnosis not present

## 2017-05-09 DIAGNOSIS — Z9884 Bariatric surgery status: Secondary | ICD-10-CM | POA: Diagnosis not present

## 2017-05-09 DIAGNOSIS — I6523 Occlusion and stenosis of bilateral carotid arteries: Secondary | ICD-10-CM

## 2017-05-09 DIAGNOSIS — I69911 Memory deficit following unspecified cerebrovascular disease: Secondary | ICD-10-CM | POA: Diagnosis not present

## 2017-05-09 MED ORDER — ALBUTEROL SULFATE HFA 108 (90 BASE) MCG/ACT IN AERS: 2.0000 | INHALATION_SPRAY | Freq: Four times a day (QID) | RESPIRATORY_TRACT | 0 refills | Status: DC | PRN

## 2017-05-09 NOTE — Patient Instructions (Signed)
Dr Sallyanne Kuster recommends that you continue on your current medications as directed. Please refer to the Current Medication list given to you today.  Your physician has requested that you have a carotid duplex. This test is an ultrasound of the carotid arteries in your neck. It looks at blood flow through these arteries that supply the brain with blood. Allow one hour for this exam. There are no restrictions or special instructions.  Dr Sallyanne Kuster recommends that you schedule a follow-up appointment in 12 months. You will receive a reminder letter in the mail two months in advance. If you don't receive a letter, please call our office to schedule the follow-up appointment.  If you need a refill on your cardiac medications before your next appointment, please call your pharmacy.

## 2017-05-09 NOTE — Progress Notes (Signed)
Patient ID: [name], female   DOB: Jun 14, 1951, 66 y.o.   MRN: [id number]     Cardiology Office Note    Date:  05/10/2017   ID:  [name], DOB 09/05/1951, MRN [id number]  PCP:  Paula Sauer, MD  Cardiologist:   Paula Klein, MD   Chief Complaint  Patient presents with  . Follow-up         History of Present Illness:  [name] is a 66 y.o. female with multiple cardiac problems. 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 She has had diastolic heart failure. She has a history of diabetes, hypertension and hyperlipidemia all of which showed remarkable improvement after she underwent yesterday bypass surgery. She underwent successful ablation for SVT many years ago, without recurrent problems Her most recent noninvasive evaluation was with a nuclear stress test and echo in 2015, both of them essentially normal except for grade 2 diastolic dysfunction and moderate left atrial dilation.  This is her first appointment since February 2017. She has had substantial deterioration in her memory. She does not remember me, although I have seen her multiple times since 2014. She is accompanied by her friend, [name], who is also my patient.  When her son passed away a couple of years ago she went prolonged bout of depression stopped taking all her medications. She seemed to be coming out of that when I loss over and every 2017. However, it appears that she has again stopped taking all of her medications. She does not really remember what and when she has been taking. She has a neurology appointment and a sleep consult scheduled on June 5 and July 30, respectively.  She denies any problems with chest pain or shortness of breath. She has not had clear-cut syncope or palpitations. She denies edema. She has lost substantial weight since I last saw  her.  She underwent coronary artery bypass surgery approximately 21 years ago. She has known chronic total bilateral carotid occlusion - her entire cerebral vascular blood supply is via large vertebral arteries. She has a remote history of diabetes mellitus and hypertension and hyperlipidemia. After gastric bypass surgery and substantial weight loss she has been very well compensated and has not required medications for these disorders in a few years. 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 She has a known history of diastolic heart failure that has usually been well compensated (NYHA functional class I-II). Nuclear perfusion study November 16, 2014 was low risk, EF 56%. Echo October 3546 showed diastolic dysfunction and high filling pressure (when weight was around 161 lb).  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  Past Medical History:  Diagnosis Date  . Allergy   . Anxiety   . Arthritis   . Asthma   . 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 Bilateral carotid artery occlusion 09/25/2013  . CAD s/p CABG 1994 09/25/2013   LIMA-LAD, SVG Diag, SVG-RCA, [facility] 2010: Stents in the proximal LAD with 40% in-stent restenosis and 50% post-stent stenosis, 80% small OM 1, 70% small OM 2, 40% first PLA; diffuse 70% distal RCA; patent SVG  to diagonal, patent SVG to distal RCA, patent atretic LIMA EF 55%; normal nuclear stress test 2011  . Cataract   . CHF (congestive heart failure) ([place])   . Chronic anemia   . Depression   . Diabetes mellitus without complication ([place])   . Diastolic dysfunction   . GERD (gastroesophageal reflux disease)   . Glaucoma   . Heart murmur   . History  of recurrent UTIs   . Hyperlipidemia   . Hypertension   . Hypocalcemia   . Ischemic heart disease   . Migraine   . Morbid obesity ([place])    s/p gastric bypass  . Osteoporosis   . Peroneal neuropathy   . Retinopathy   . Stroke [facility])     Past Surgical History:  Procedure Laterality Date  . APPENDECTOMY    . CARDIAC CATHETERIZATION  03/30/2009   3 vessel CAD,patent grafts  . CHOLECYSTECTOMY    . CORONARY ARTERY BYPASS GRAFT  1994   LIM to LAD,SVG to first diagonal,SVG to distal RCA  . GASTRIC BYPASS  2002  . HEEL SPUR EXCISION  2012  . HERNIA REPAIR    . NM MYOCAR PERF WALL MOTION  2011   no ischemia  . RADIOFREQUENCY ABLATION     AVNRT - successful  . SMALL INTESTINE SURGERY    . TOTAL HIP ARTHROPLASTY Right 12/27/2014   Procedure: TOTAL HIP ARTHROPLASTY ANTERIOR APPROACH;  Surgeon: Paula Corning, MD;  Location: [facility];  Service: Orthopedics;  Laterality: Right;  . TRIGGER FINGER RELEASE  2012  . US ECHOCARDIOGRAPHY  05/09/2010   mild MR,mild mitral and aortic sclerosis  . VAGINAL HYSTERECTOMY    . VENTRAL HERNIA REPAIR      Outpatient Medications Prior to Visit  Medication Sig Dispense Refill  . acetaminophen (TYLENOL) 500 MG tablet Take 1,500 mg by mouth every 6 (six) hours as needed for mild pain.     . blood glucose meter kit and supplies KIT Dispense based on patient and insurance preference.  Check blood sugar twice daily as directed. 1 each 0  . GLIPIZIDE XL 10 MG 24 hr tablet TAKE ONE TABLET BY MOUTH ONCE DAILY WITH  BREAKFAST 90 tablet 2  . glucose blood test strip 1 each by Other route 2 (two) times daily. Dispense based on Insurance and patient preference.  Check blood sugars twice daily as instructed. 100 each 12  . metFORMIN (GLUCOPHAGE) 500 MG tablet Take 1 tablet (500 mg total) by mouth 2 (two) times daily with a meal. 60 tablet 3  . morphine (MS CONTIN) 30 MG 12 hr tablet Take 30 mg by mouth daily as needed.    . nitroGLYCERIN (NITROLINGUAL) 0.4 MG/SPRAY spray  USE ONE TO TWO SPRAYS UNDER THE TONGUE EVERY 5 MINUTES AS NEEDED FOR CHEST PAIN 12 g 5  . sertraline (ZOLOFT) 50 MG tablet TAKE 1 TABLET BY MOUTH ONCE DAILY FOR 2 WEEKS, THEN INCREASE TO 2 TABLETS DAILY 60 tablet 0  . Vitamin D, Ergocalciferol, (DRISDOL) 50000 UNITS CAPS capsule Take 1 capsule (50,000 Units total) by mouth every 7 (seven) days. 12 capsule 1  . albuterol (PROVENTIL HFA;VENTOLIN HFA) 108 (90 Base) MCG/ACT inhaler Inhale 2 puffs into the lungs every 6 (six) hours as needed for wheezing or shortness of breath. 1 Inhaler 0  . esomeprazole (NEXIUM) 40 MG capsule Take 1 capsule (40 mg total) by mouth daily. (Patient taking differently: Take 40 mg by mouth daily as needed. ) 90 capsule 1  . aspirin  81 MG tablet Take 81 mg by mouth daily.     No facility-administered medications prior to visit.      Allergies:   Latex; Metoclopramide hcl; Neurontin [gabapentin]; and Penicillins   Social History   Social History  . Marital status: Divorced    Spouse name: N/A  . Number of children: 2  . Years of education: N/A   Occupational History  . disabled    Social History Main Topics  . Smoking status: Former Smoker    Quit date: 12/18/1987  . Smokeless tobacco: Never Used  . Alcohol use No  . Drug use: No  . Sexual activity: Not Currently   Other Topics Concern  . None   Social History Narrative   She used to work with the  [employer]. 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   Lives at home w/ her little dog   Right-handed   Caffeine: occasional tea     Family History:  The patient's family history includes Alcohol abuse in her father; Alzheimer's disease in her father; Arthritis in her father and mother; Asthma in her brother, brother, and sister; Cancer in her father; Depression in her brother, brother, father, and sister; Diabetes in her brother, brother, mother, and sister; Drug abuse in her sister; Heart attack in her  sister and son; Heart disease in her mother and sister; Heart failure in her mother; Hypertension in her father and son; Kidney disease in her son; Mental illness in her sister; Vision loss in her son.   ROS:   Please see the history of present illness.    ROS All other systems reviewed and are negative.   PHYSICAL EXAM:   VS:  BP 128/66   Pulse 74   Ht _0  (1.575 m)   Wt 146 lb (66.2 kg)   BMI 26.70 kg/m     General: Alert, oriented x3, no distress. Knows she is in the cardiology office, but does not remember me or this office. Head: no evidence of trauma, PERRL, EOMI, no exophtalmos or lid lag, no myxedema, no xanthelasma; normal ears, nose and oropharynx Neck: normal jugular venous pulsations and no hepatojugular reflux; brisk carotid pulses without delay and no carotid bruits Chest: clear to auscultation, no signs of consolidation by percussion or palpation, normal fremitus, symmetrical and full respiratory excursions Cardiovascular: normal position and quality of the apical impulse, regular rhythm, normal first and second heart sounds, no murmurs, rubs or gallops Abdomen: no tenderness or distention, no masses by palpation, no abnormal pulsatility or arterial bruits, normal bowel sounds, no hepatosplenomegaly Extremities: no clubbing, cyanosis or edema; 2+ radial, ulnar and brachial pulses bilaterally; 2+ right femoral, posterior tibial and dorsalis pedis pulses; 2+ left femoral, posterior tibial and dorsalis pedis pulses; no subclavian or femoral bruits Neurological: grossly nonfocal   Wt Readings from Last 3 Encounters:  05/09/17 146 lb (66.2 kg)  04/15/17 143 lb 6.4 oz (65 kg)  12/24/16 142 lb 3.2 oz (64.5 kg)      Studies/Labs Reviewed:   EKG:  EKG is ordered today.  Shows normal sinus rhythm and no repolarization abnormalities. QTC 446 ms  Recent Labs: 06/06/2016: ALT 19; BUN 9; Creat 0.58; Magnesium 2.0; Potassium 4.3; Sodium 140 04/15/2017: TSH 0.358   Lipid  Panel    Component Value Date/Time   CHOL 76 (L) 10/24/2015 1453   TRIG 58 10/24/2015 1453   HDL 35 (L) 10/24/2015 1453   CHOLHDL 2.2 10/24/2015  1453   VLDL 12 10/24/2015 1453   LDLCALC 29 10/24/2015 1453      ASSESSMENT:    1. Chronic diastolic heart failure ([place])   2. Coronary artery disease involving native coronary artery of native heart without angina pectoris   3. Bilateral carotid artery occlusion   4. AVNRT s/p successful RF ablation   5. H/O gastric bypass   6. 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 CHF: Clinically euvolemic, NYHA functional class I. Diastolic dysfunction documented by echo in 2015 She is not taking diuretics. I'm not sure what her current "dry weight" his sense she has lost so much real weight. 2. CAD: Asymptomatic 24 years after bypass surgery. Low risk perfusion study and normal left ventricular ejection fraction by [facility] December 2015 3. Bilateral internal carotid artery occlusion: No focal neurological complaints, I'm not sure how her memory problems might be related to her vascular disease, but I recommended that she have repeat ultrasound evaluation of her vertebral arteries. 4. No clinical recurrence of AVNRT since ablation 5. She has had problems with malabsorption of multiple minerals including calcium (leading to hypocalcemia and long QT syndrome) iron Pred cyst leading to anemia) requiring intravenous iron administration. Recent labs are not available.  6. Memory defect: Multiple etiologies are entertained including vascular disease, nutritional deficiencies, depression, recurrent hypoglycemia. The deterioration is quite striking. She has upcoming neurology evaluation.   Medication Adjustments/Labs and Tests Ordered: Current medicines are reviewed at length with the patient today.  Concerns regarding medicines are outlined above.  Medication changes, Labs and Tests ordered today are  listed in the Patient Instructions below. Patient Instructions  Dr Sallyanne Kuster recommends that you continue on your current medications as directed. Please refer to the Current Medication list given to you today.  Your physician has requested that you have a carotid duplex. This test is an ultrasound of the carotid arteries in your neck. It looks at blood flow through these arteries that supply the brain with blood. Allow one hour for this exam. There are no restrictions or special instructions.  Dr Sallyanne Kuster recommends that you schedule a follow-up appointment in 12 months. You will receive a reminder letter in the mail two months in advance. If you don't receive a letter, please call our office to schedule the follow-up appointment.  If you need a refill on your cardiac medications before your next appointment, please call your pharmacy.      Signed, Paula Klein, MD  05/10/2017 11:15 AM    [facility] [address], [place], [place]  [address] Phone: [phone]; Fax: [phone]

## 2017-05-10 DIAGNOSIS — I69911 Memory deficit following unspecified cerebrovascular disease: Secondary | ICD-10-CM | POA: Insufficient documentation

## 2017-05-16 ENCOUNTER — Telehealth: Payer: Self-pay | Admitting: Cardiovascular Disease

## 2017-05-16 NOTE — Telephone Encounter (Signed)
Called the patient and left a VM for her to call back and schedule carotid test.

## 2017-05-21 ENCOUNTER — Encounter (INDEPENDENT_AMBULATORY_CARE_PROVIDER_SITE_OTHER): Payer: Self-pay

## 2017-05-21 ENCOUNTER — Ambulatory Visit (INDEPENDENT_AMBULATORY_CARE_PROVIDER_SITE_OTHER): Payer: Federal, State, Local not specified - PPO | Admitting: Neurology

## 2017-05-21 ENCOUNTER — Encounter: Payer: Self-pay | Admitting: Neurology

## 2017-05-21 VITALS — BP 159/71 | HR 79 | Resp 12 | Ht 62.0 in | Wt 144.0 lb

## 2017-05-21 DIAGNOSIS — F0150 Vascular dementia without behavioral disturbance: Secondary | ICD-10-CM

## 2017-05-21 DIAGNOSIS — R0683 Snoring: Secondary | ICD-10-CM

## 2017-05-21 DIAGNOSIS — G4700 Insomnia, unspecified: Secondary | ICD-10-CM

## 2017-05-21 DIAGNOSIS — G4730 Sleep apnea, unspecified: Secondary | ICD-10-CM | POA: Diagnosis not present

## 2017-05-21 DIAGNOSIS — I5022 Chronic systolic (congestive) heart failure: Secondary | ICD-10-CM | POA: Diagnosis not present

## 2017-05-21 DIAGNOSIS — I6523 Occlusion and stenosis of bilateral carotid arteries: Secondary | ICD-10-CM

## 2017-05-21 DIAGNOSIS — I63033 Cerebral infarction due to thrombosis of bilateral carotid arteries: Secondary | ICD-10-CM | POA: Diagnosis not present

## 2017-05-21 MED ORDER — ALPRAZOLAM 0.5 MG PO TABS: 0.5000 mg | ORAL_TABLET | Freq: Every evening | ORAL | 0 refills | Status: DC | PRN

## 2017-05-21 NOTE — Progress Notes (Signed)
SLEEP MEDICINE CLINIC   Provider:  Larey Sloan, [place] D  Primary Care Physician:  Paula Sauer, MD   Referring Provider: Dr. Sarina Sloan   Chief Complaint  Patient presents with  . New Patient (Initial Visit)    RM 9 with brother. Internal referral from [name]. Patient reports that she has trouble sleeping at night. Getting between 3-6 hr sleep per night.     HPI:  [name] is a 66 y.o. female , seen here as in a referral from Paula. Sarina Sloan, for a sleep evaluation.   The patient states that she does not sleep well or sound, and that she feels her sleep is too short. She does not feel refreshed and restored.  [name] had been originally seen by my colleague Paula. Sarina Sloan upon referral by Paula. Lenna Sloan. She had been also seen by [name], who initiated the referral and mentioned a history of depression, anxiety, bilateral carotid artery occlusion, coronary artery disease status post CABG in 1994, chronic anemia, congestive heart failure, diabetes, glaucoma, hyperlipidemia, hypertension status post gastric bypass surgery, stroke, severe protein calorie malnutrition, and gradual memory loss for the last 3 years. Her brother has accompanied him to give additional information. Paula. Jaynee Sloan evaluated her for the possibility of vascular dementia versus Alzheimer's. She still grieves her husband's death and had to put a pet to sleep as well. She has not slept well in many month.    Sleep habits are as follows:  Sometimes she will fall asleep on the couch in the evening hours watching TV, and some nights she may stay on the couch. The evening she will wake up and move over to the bedroom where she does not watch TV. The bedroom is described as quiet, cool and dark. She prefers to sleep on her side. She is usually asleep for at least 2 hours before waking up, she has frequent bathroom breaks. If she goes to bed early she also gets up and rises early in the morning usually after about  4- 5 hours of sleep. Early would be 11 PM and she would be able to rise at about 6 AM. She reports that she snores, and she is sure of this. She is a former smoker. She does not report vivid dreams. She reports being more depressed and sad recently. She lost her son 3 years ago and misses him.  Sleep medical history and family sleep history:   Gastric bypass surgery in 1988, had OSA before. Developed malnutrition. Memory loss.    Social history:  She grow up with an alcoholic father, had 2 children, got divorced.  her surviving child , an adult daughter with 3 grandchildren, is estranged.  Lives alone with her chihuahua. Smokeless tobacco, no ETOH, caffeine: diet coke 3 a week. Iced tea daily. No coffee.   Review of Systems: Out of a complete 14 system review, the patient complains of only the following symptoms, and all other reviewed systems are negative.  Incontinence, diarrhea, snoring, leg edema, easy bruising, increased thirst, protein malnutrition, skin sensitivity, depression, grieving, sleep deprivation, change in appetite, loss of interest in activities, memory loss, confusion, snoring.   Epworth score 7 , Fatigue severity score 26  , depression score 8/15    Social History   Social History  . Marital status: Divorced    Spouse name: N/A  . Number of children: 2  . Years of education: N/A   Occupational History  . disabled  Social History Main Topics  . Smoking status: Former Smoker    Quit date: 12/18/1987  . Smokeless tobacco: Never Used  . Alcohol use No  . Drug use: No  . Sexual activity: Not Currently   Other Topics Concern  . Not on file   Social History Narrative   She used to work with the  [employer]. 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   Lives at home w/ her little dog   Right-handed   Caffeine: occasional tea    Family History  Problem Relation Age of Onset  . Diabetes Brother   .  Asthma Brother   . Depression Brother   . Asthma Brother   . Depression Brother   . Diabetes Brother   . Cancer Father        skin  . Alcohol abuse Father   . Arthritis Father   . Depression Father   . Hypertension Father   . Alzheimer's disease Father   . Diabetes Mother   . Heart failure Mother   . Arthritis Mother   . Heart disease Mother   . Asthma Sister   . Heart attack Sister   . Depression Sister   . Diabetes Sister   . Drug abuse Sister   . Heart disease Sister   . Mental illness Sister   . Heart attack Son   . Hypertension Son   . Kidney disease Son        transplant 2003  . Vision loss Son     Past Medical History:  Diagnosis Date  . Allergy   . Anxiety   . Arthritis   . Asthma   . 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 Bilateral carotid artery occlusion 09/25/2013  . CAD s/p CABG 1994 09/25/2013   LIMA-LAD, SVG Diag, SVG-RCA, [place] 2010: Stents in the proximal LAD with 40% in-stent restenosis and 50% post-stent stenosis, 80% small OM 1, 70% small OM 2, 40% first PLA; diffuse 70% distal RCA; patent SVG to diagonal, patent SVG to distal RCA, patent atretic LIMA EF 55%; normal nuclear stress test 2011  . Cataract   . CHF (congestive heart failure) ([place])   . Chronic anemia   . Depression   . Diabetes mellitus without complication ([place])   . Diastolic dysfunction   . GERD (gastroesophageal reflux disease)   . Glaucoma   . Heart murmur   . History of recurrent UTIs   . Hyperlipidemia   . Hypertension   . Hypocalcemia   . Ischemic heart disease   . Migraine   . Morbid obesity ([place])    s/p gastric bypass  . Osteoporosis   . Peroneal neuropathy   . Retinopathy   . Stroke [facility])     Past Surgical History:  Procedure Laterality Date  . APPENDECTOMY    . CARDIAC CATHETERIZATION  03/30/2009   3 vessel CAD,patent grafts  . CHOLECYSTECTOMY    . CORONARY ARTERY BYPASS GRAFT  1994   LIM to LAD,SVG to first diagonal,SVG to distal RCA  .  GASTRIC BYPASS  2002  . HEEL SPUR EXCISION  2012  . HERNIA REPAIR    . NM MYOCAR PERF WALL MOTION  2011   no ischemia  . RADIOFREQUENCY ABLATION     AVNRT - successful  . SMALL INTESTINE SURGERY    . TOTAL HIP ARTHROPLASTY Right 12/27/2014   Procedure: TOTAL HIP ARTHROPLASTY ANTERIOR APPROACH;  Surgeon: Alta Corning,  MD;  Location: [facility];  Service: Orthopedics;  Laterality: Right;  . TRIGGER FINGER RELEASE  2012  . US ECHOCARDIOGRAPHY  05/09/2010   mild MR,mild mitral and aortic sclerosis  . VAGINAL HYSTERECTOMY    . VENTRAL HERNIA REPAIR      Current Outpatient Prescriptions  Medication Sig Dispense Refill  . acetaminophen (TYLENOL) 500 MG tablet Take 1,500 mg by mouth every 6 (six) hours as needed for mild pain.     Marland Kitchen albuterol (PROVENTIL HFA;VENTOLIN HFA) 108 (90 Base) MCG/ACT inhaler Inhale 2 puffs into the lungs every 6 (six) hours as needed for wheezing or shortness of breath. 1 Inhaler 0  . aspirin 81 MG tablet Take 81 mg by mouth daily.    . blood glucose meter kit and supplies KIT Dispense based on patient and insurance preference.  Check blood sugar twice daily as directed. 1 each 0  . CALCIUM CITRATE PO Take 1,500 mg by mouth 3 (three) times daily.    . Cyanocobalamin (VITAMIN B 12 PO) Take 2,500 mg by mouth daily.    Marland Kitchen GLIPIZIDE XL 10 MG 24 hr tablet TAKE ONE TABLET BY MOUTH ONCE DAILY WITH  BREAKFAST 90 tablet 2  . glucose blood test strip 1 each by Other route 2 (two) times daily. Dispense based on Insurance and patient preference.  Check blood sugars twice daily as instructed. 100 each 12  . IRON, FERROUS SULFATE, PO Take 2 tablets by mouth at bedtime.    . metFORMIN (GLUCOPHAGE) 500 MG tablet Take 1 tablet (500 mg total) by mouth 2 (two) times daily with a meal. 60 tablet 3  . morphine (MS CONTIN) 30 MG 12 hr tablet Take 30 mg by mouth daily as needed.    . nitroGLYCERIN (NITROLINGUAL) 0.4 MG/SPRAY spray USE ONE TO TWO SPRAYS UNDER THE TONGUE EVERY 5 MINUTES AS NEEDED FOR  CHEST PAIN 12 g 5  . sertraline (ZOLOFT) 50 MG tablet TAKE 1 TABLET BY MOUTH ONCE DAILY FOR 2 WEEKS, THEN INCREASE TO 2 TABLETS DAILY 60 tablet 0  . vitamin A 10000 UNIT capsule Take 10,000 Units by mouth daily.    . Vitamin D, Ergocalciferol, (DRISDOL) 50000 UNITS CAPS capsule Take 1 capsule (50,000 Units total) by mouth every 7 (seven) days. 12 capsule 1   No current facility-administered medications for this visit.     Allergies as of 05/21/2017 - Review Complete 05/21/2017  Allergen Reaction Noted  . Latex  03/30/2014  . Metoclopramide hcl  04/08/2012  . Neurontin [gabapentin]  04/08/2012  . Penicillins  04/08/2012    Vitals: BP (!) 159/71 (BP Location: Right Arm, Patient Position: Sitting, Cuff Size: Normal)   Pulse 79   Resp 12   Ht _0  (1.575 m)   Wt 144 lb (65.3 kg)   SpO2 98%   BMI 26.34 kg/m  Last Weight:  Wt Readings from Last 1 Encounters:  05/21/17 144 lb (65.3 kg)   KAJ:GOTL mass index is 26.34 kg/m.     Last Height:   Ht Readings from Last 1 Encounters:  05/21/17 _1  (1.575 m)    Physical exam:  General: The patient is awake, alert and appears not in acute distress. The patient has poor dentition.  Head: Normocephalic, atraumatic. Neck is supple. Mallampati 1,  neck circumference:14.5 ". Nasal airflow congested- Retrognathia is not seen. There is prognathia.  Cardiovascular:  Regular rate and rhythm, without  murmurs or carotid bruit, and without distended neck veins. Respiratory: Lungs are clear to  auscultation. Skin:  Without evidence of edema, or rash Trunk: BMI is 26. The patient's posture is stooped  Neurologic exam : The patient is awake and alert, oriented to place and time.   Memory subjective described as intact.   MOCA:No flowsheet data found. MMSE: MMSE - Mini Mental State Exam 04/15/2017  Orientation to time 4  Orientation to Place 5  Registration 3  Attention/ Calculation 5  Recall 1  Language- name 2 objects 2  Language- repeat  1  Language- follow 3 step command 3  Language- read & follow direction 1  Write a sentence 1  Copy design 1  Total score 27       Attention span & concentration ability appears normal.  Speech is fluent,  without dysarthria, dysphonia or aphasia.  Mood and affect are appropriate.  Cranial nerves: Pupils are equal and briskly reactive to light. Funduscopic exam without evidence of pallor or edema.  Extraocular movements  in vertical and horizontal planes intact and without nystagmus. Visual fields by finger perimetry are intact. Hearing to finger rub intact.   Facial sensation intact to fine touch.  Facial motor strength is symmetric and tongue and uvula move midline. Shoulder on the right is lower.  Motor exam:   Normal tone, muscle bulk and symmetric strength in all extremities. There is clicking with ROm in both shoulders.   Sensory:  Fine touch, pinprick and vibration were  normal.  Coordination: Rapid alternating movements / Finger-to-nose maneuver  normal without evidence of ataxia, dysmetria or tremor.  Gait and station: Patient walks without assistive device and is able unassisted to climb up to the exam table. Strength within normal limits.    Deep tendon reflexes: in the  upper and lower extremities are  2 /2 , symmetric and intact.    Assessment:  After physical and neurologic examination, review of laboratory studies,  Personal review of imaging studies, reports of other /same  Imaging studies, results of polysomnography and / or neurophysiology testing and pre-existing records as far as provided in visit., my assessment is   1) Mrs. Pressey experiences sleep problems in form of insomnia but she is also reportedly a snorer. I would check her for sleep apnea, sleep hypoxemia, and snoring to see if this causes some of her interruptions in sleep. I would consider her sleep deprived when she reported that she only sleeps 4-5 hours at night. She also has frequent nocturia  which can have different causes one of them untreated sleep apnea, another one is the treatment of congestive heart failure with fluid-diuretic pills. She is not on diuretics at this time.   2)She was diagnosed with protein deficiency malnourishment, seemingly related to her gastric bypass surgery almost 2 decades ago. I will let her internist figure that out.    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 Mrs. Karrer has several risk factors for vascular dementia and has a family history of Alzheimer which affected her father..    The patient was advised of the nature of the diagnosed disorder , the treatment options and the  risks for general health and wellness arising from not treating the condition.   I spent more than 50 minutes of face to face time with the patient.  Greater than 50% of time was spent in counseling and coordination of care. We have discussed the diagnosis and differential and I answered the patient's questions.    Plan:  Treatment plan and additional workup : SPLIT study,  will allow patient xanax for sleep aid , to be used sleep lab prn.    Paula Seat, MD 06/20/1699, 1:74 PM  Certified in Neurology by ABPN Certified in [place] by [facility] [address] [place], [place] [address]

## 2017-05-21 NOTE — Patient Instructions (Signed)

## 2017-05-22 ENCOUNTER — Other Ambulatory Visit: Payer: Self-pay | Admitting: Physician Assistant

## 2017-05-22 DIAGNOSIS — F321 Major depressive disorder, single episode, moderate: Secondary | ICD-10-CM

## 2017-05-22 DIAGNOSIS — E1165 Type 2 diabetes mellitus with hyperglycemia: Secondary | ICD-10-CM

## 2017-05-22 DIAGNOSIS — IMO0001 Reserved for inherently not codable concepts without codable children: Secondary | ICD-10-CM

## 2017-05-23 NOTE — Telephone Encounter (Signed)
Medication filled x1 with no refills.   Requires office visit before any further refills can be given.   Letter sent.  

## 2017-05-28 ENCOUNTER — Ambulatory Visit (HOSPITAL_COMMUNITY)
Admission: RE | Admit: 2017-05-28 | Discharge: 2017-05-28 | Disposition: A | Discharge status: Home / Self Care | Payer: Federal, State, Local not specified - PPO | Source: Ambulatory Visit | Attending: Cardiovascular Disease | Admitting: Cardiovascular Disease

## 2017-05-28 DIAGNOSIS — I6523 Occlusion and stenosis of bilateral carotid arteries: Secondary | ICD-10-CM

## 2017-06-16 ENCOUNTER — Ambulatory Visit (INDEPENDENT_AMBULATORY_CARE_PROVIDER_SITE_OTHER): Payer: Medicare Other | Admitting: Neurology

## 2017-06-16 DIAGNOSIS — I5022 Chronic systolic (congestive) heart failure: Secondary | ICD-10-CM

## 2017-06-16 DIAGNOSIS — I63033 Cerebral infarction due to thrombosis of bilateral carotid arteries: Secondary | ICD-10-CM

## 2017-06-16 DIAGNOSIS — G4730 Sleep apnea, unspecified: Secondary | ICD-10-CM | POA: Diagnosis not present

## 2017-06-16 DIAGNOSIS — G4737 Central sleep apnea in conditions classified elsewhere: Secondary | ICD-10-CM

## 2017-06-16 DIAGNOSIS — F0150 Vascular dementia without behavioral disturbance: Secondary | ICD-10-CM

## 2017-06-16 DIAGNOSIS — R0683 Snoring: Secondary | ICD-10-CM

## 2017-06-16 DIAGNOSIS — G4700 Insomnia, unspecified: Secondary | ICD-10-CM

## 2017-06-28 NOTE — Procedures (Signed)
PATIENT'S NAME:  Tyjae, Shvartsman DOB:      [birth year]      MR#:    [id number]     DATE OF RECORDING: 06/16/2017 REFERRING M.D.:  Dena Billet, PA-C, and Sarina Ill, MD Study Performed:   Baseline Polysomnogram HISTORY:  Mrs. Chenoweth is 66 years old and had been originally seen by my colleague Dr. Sarina Ill upon referral by Dr. Lenna Gilford. She had been also seen by PA Dixon, who initiated the referral and mentioned a history of depression, anxiety, bilateral carotid artery occlusion, coronary artery disease status post CABG in 1994, chronic anemia, congestive heart failure, diabetes, glaucoma, hyperlipidemia, hypertension, OSA until weight loss surgery in 1988. Is status post gastric bypass surgery, stroke, severe protein calorie malnutrition, and gradual memory loss for the last 3 years? Dr. Jaynee Eagles evaluated her for the possibility of vascular dementia versus Alzheimer's.   She still grieves her husband's death, is estranged form daughter and grandchildren, and had to put a pet to sleep as well. She has been severely depressed and confused, has not slept well in many month.  Allergies, anxiety, arthritis, COPD, CAD, cataract, CHF, CVA, depression, diabetes, GERD, heart murmur, hyperlipidemia, HTN, hypocalcaemia, migraines, morbid obesity, osteoporosis, retinopathy and stroke  The patient endorsed the Epworth Sleepiness Scale at 7/24 points.   The patient's weight 144 pounds with a height of 62 (inches), resulting in a BMI of 26.4 kg/m2. The patient's neck circumference measured 14.5 inches.  CURRENT MEDICATIONS: Acetaminophen, Albuterol, Aspirin, Calcium citrate, Cyanocobalamin, Glipizide, Iron, and Metformin   PROCEDURE:  This is a multichannel digital Polysomnogram utilizing the Somnostar 11.2 system.  Electrodes and sensors were applied and monitored per AASM Specifications.   EEG, EOG, Chin and Limb EMG, were sampled at 200 Hz.  ECG, Snore and Nasal Pressure, Thermal Airflow, Respiratory Effort,  CPAP Flow and Pressure, Oximetry was sampled at 50 Hz. Digital video and audio were recorded.      BASELINE STUDY Lights Out was at 20:50 and Lights On at 05:01.  Total recording time (TRT) was 491.5 minutes, with a total sleep time (TST) of 389 minutes.   The patient's sleep latency was 11 minutes.  REM latency was 0 minutes.  The sleep efficiency was 79.1 %.    SLEEP ARCHITECTURE: WASO (Wake after sleep onset) was 102 minutes.  There were 9.5 minutes in Stage N1, 379.5 minutes Stage N2, 0 minutes Stage N3 and 0 minutes in Stage REM.  The percentage of Stage N1 was 2.4%, Stage N2 was 97.6%, Stage N3 was 0% and Stage R (REM sleep) was 0%.   RESPIRATORY ANALYSIS:  There were a total of 441 respiratory events:  52 obstructive apneas, 317 central apneas and 0 mixed apneas with a total of 369 apneas and an apnea index (AI) of 56.9 /hour. There were 72 hypopneas with a hypopnea index of 11.1 /hour. The patient also had 0 respiratory event related arousals (RERAs).      The total APNEA/HYPOPNEA INDEX (AHI) was 68.0/hour and the total RESPIRATORY DISTURBANCE INDEX was 68. /hour.  0 events occurred in REM sleep and 461 events in NREM. The REM AHI was 0.0 /hour, versus a non-REM AHI of 68. The patient spent 371.5 minutes of total sleep time in the supine position and 18 minutes in non-supine. The supine AHI was 66.4 versus a non-supine AHI of 102.8.  OXYGEN SATURATION & C02:  The Wake baseline 02 saturation was 94%, with the lowest being 59%. Time spent below 89% saturation equaled  75 minutes.   PERIODIC LIMB MOVEMENTS:   The patient had a total of 0 Periodic Limb Movements.  The arousals were noted as: 17 were spontaneous, 0 were associated with PLMs, and 434 were associated with respiratory events.  Audio and video analysis did not show any abnormal or unusual movements, phonations or vocalizations.   The patient was disoriented and unable to find the bathroom after using it 3 times. CPAP was attempted but  patient felt sick and vomited, could not tolerate CPAP.  The patient took 3 bathroom breaks   IMPRESSION:  1. 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  2. Extremely fragmented sleep. 3. Patient was not given oxygen after she failed to tolerate CPAP.   RECOMMENDATIONS:  1. Advise to have patient see pulmonology for BiPAP or ASV, and also to be evaluated by Pulmonology to possibly start nocturnal O2 supplement. 2. A follow up appointment can be offered in the Sleep Clinic at [facility]. The patient can receive this result from her primary neurologist, Dr Jaynee Eagles.  3. The referring provider, Dena Billet, PA will be notified of the results.      I certify that I have reviewed the entire raw data recording prior to the issuance of this report in accordance with the Standards of Accreditation of the American Academy of Sleep Medicine (AASM)    Larey Seat, MD     06-27-2017 Diplomat, American Board of Psychiatry and Neurology  Diplomat, American Board of [place] Director, [employer] [facility] [employer]

## 2017-06-28 NOTE — Addendum Note (Signed)
Addended by: Larey Seat on: 06/28/2017 02:30 PM   Modules accepted: Orders

## 2017-07-02 ENCOUNTER — Telehealth: Payer: Self-pay | Admitting: Neurology

## 2017-07-02 NOTE — Telephone Encounter (Signed)
-----   Message from Larey Seat, MD sent at 06/28/2017  2:28 PM EDT ----- Mrs. Stoffer had a difficult night with extremely frequent apnea arousals, the vast majority was central apnea. She became nauseated, confused during the night and had to vomit when CPAP was initiated. She will need a follow up with a pulmonologist to qualify for possible nocturnal oxygen , perhaps patient can be considered for BiPAP or ASV.   CC Cathlean Sauer, MD , Dena Billet PA and Sarina Ill, MD

## 2017-07-02 NOTE — Telephone Encounter (Signed)
Called pt with results to sleep study. No answer. LVM for pt to call back

## 2017-07-02 NOTE — Telephone Encounter (Signed)
Pt's brother returned my call in regards to [name]'s sleep study. Per DPR I was able to discuss the results with him. I explained to the [name] that she has severe central dominant complex sleep apnea associated with severe low oxygen as low as 59%. I explained that due to difficult night in the test and not tolerating the equipment the pt would need to follow up with a pulmonologist. The referral is already placed and we will make sure they will contact them. The brother is asking that they call him to help with setting that up as he helps cares for her. Pt brother verbalized understanding and had no further questions at this time

## 2017-07-03 NOTE — Telephone Encounter (Signed)
Referral has been sent via Epic to [employer] . Thanks Hinton Dyer .

## 2017-07-04 ENCOUNTER — Encounter: Payer: Self-pay | Admitting: Family Medicine

## 2017-07-15 ENCOUNTER — Ambulatory Visit: Payer: Federal, State, Local not specified - PPO | Admitting: Neurology

## 2017-07-15 ENCOUNTER — Telehealth: Payer: Self-pay

## 2017-07-15 NOTE — Telephone Encounter (Signed)
Pt's brother called stating that pt wasn't feeling well so she would not make today's appt. R/s'd for 08/16/17.

## 2017-07-16 ENCOUNTER — Encounter: Payer: Self-pay | Admitting: Neurology

## 2017-08-09 ENCOUNTER — Ambulatory Visit (INDEPENDENT_AMBULATORY_CARE_PROVIDER_SITE_OTHER): Payer: Medicare Other | Admitting: Internal Medicine

## 2017-08-09 ENCOUNTER — Encounter: Payer: Self-pay | Admitting: Internal Medicine

## 2017-08-09 VITALS — BP 120/72 | HR 77 | Ht 62.0 in | Wt 142.2 lb

## 2017-08-09 DIAGNOSIS — J449 Chronic obstructive pulmonary disease, unspecified: Secondary | ICD-10-CM | POA: Insufficient documentation

## 2017-08-09 DIAGNOSIS — I6523 Occlusion and stenosis of bilateral carotid arteries: Secondary | ICD-10-CM | POA: Diagnosis not present

## 2017-08-09 DIAGNOSIS — Z23 Encounter for immunization: Secondary | ICD-10-CM | POA: Diagnosis not present

## 2017-08-09 DIAGNOSIS — G4731 Primary central sleep apnea: Secondary | ICD-10-CM

## 2017-08-09 MED ORDER — UMECLIDINIUM-VILANTEROL 62.5-25 MCG/INH IN AEPB: 1.0000 | INHALATION_SPRAY | Freq: Every day | RESPIRATORY_TRACT | 0 refills | Status: DC

## 2017-08-09 NOTE — Patient Instructions (Signed)
Order- schedule CPAP/BIPAP titration sleep study    Dx Complex Sleep apnea     Sample Anoro inhaler     Inhale one puff, once daily    See if this helps your breathing  Order- Office Spirometry  Order- Walk test on room air  Please call as needed

## 2017-08-09 NOTE — Progress Notes (Signed)
Patient seen in the office today and instructed on use of Anoro Ellipta.  Patient expressed understanding and demonstrated technique.  

## 2017-08-09 NOTE — Assessment & Plan Note (Signed)
She had demonstrated severe oxygen desaturation with her complex sleep apnea but could not tolerate CPAP trial on that study. Oxygenation while awake has been normal at this visit. We need to see if correction of her sleep-disordered breathing can correct the desaturation, or she will require supplemental oxygen at night. Plan-we're scheduling a dedicated CPAP/BiPAP titration study. If this is insufficient we may need AVS.

## 2017-08-09 NOTE — Progress Notes (Signed)
08/09/17-66 year old female former smoker for sleep evaluation. Referred courtesy o fDr Dohmeier-had sleep study  7.13.18-no CPAP NPSG at [facility] 06/28/17- AHI 68/ hr, desaturation to 59%, recording 75 minutes below 89% saturation, body weight 144 pounds. Patient was noted to get confused. She had nausea and vomiting on attempt to use CPAP.  Diagnosis from that study was severe Central Dominant Complex Sleep Apnea, associated with severe sleep hypoxemia. Extremely fragmented sleep.  She was referred to pulmonary for consideration of BiPAP or ASV and for consideration of nocturnal supplemental oxygen. Medical history as forwarded included history of depression, anxiety, bilateral carotid artery occlusion, CAD/ CABG 1994, chronic anemia, CHF, DM 2, glaucoma, hyperlipidemia, HBP, OSA prior to weight loss surgery in 1988/gastric bypass surgery, CVA, severe protein calorie malnutrition, gradual memory loss over 3 years, and more recent severe depression and confusion. She lives alone and comes today with brother [name], saying that her memory is not good. She had been a 2 pack per day smoker but has quit. Epworth sleepiness score 16/24 Sleeping habit is bedtime between 10 PM and midnight, uncertain sleep latency, bathroom 3 or 4 times and usually up between 7 and 8 AM. Little caffeine and no sleep medicines.. Sleep environment as comfortable although she does have backaches. She does not usually cough or wheeze. Aware of some shortness of breath with exertion. Denies chest pain, palpitation or swelling feet.  Previous atrial fibrillation treated with ablation. Still followed by cardiology with diagnosis of chronic diastolic heart failure. Office Spirometry 08/09/17-WNL-FVC 2.33/82%, FEV1 1.78/82%, ratio 0.76, FEF 25-75% 1.57/81% Walk Test on room air 08/09/17-3 laps-did not drop below 96% on room air with maximum heart rate 88. No oxygen limitation to exercise.  Prior to Admission medications    Medication Sig Start Date End Date Taking? Authorizing Provider  acetaminophen (TYLENOL) 500 MG tablet Take 1,500 mg by mouth every 6 (six) hours as needed for mild pain.    Yes [provider]  albuterol (PROVENTIL HFA;VENTOLIN HFA) 108 (90 Base) MCG/ACT inhaler Inhale 2 puffs into the lungs every 6 (six) hours as needed for wheezing or shortness of breath. 05/09/17  Yes Croitoru, Mihai, MD  ALPRAZolam (XANAX) 0.5 MG tablet Take 1 tablet (0.5 mg total) by mouth at bedtime as needed for anxiety. 05/21/17  Yes Dohmeier, Asencion Partridge, MD  aspirin 81 MG tablet Take 81 mg by mouth daily.   Yes [provider]  blood glucose meter kit and supplies KIT Dispense based on patient and insurance preference.  Check blood sugar twice daily as directed. 12/24/16  Yes Orlena Sheldon, PA-C  CALCIUM CITRATE PO Take 1,500 mg by mouth 3 (three) times daily.   Yes [provider]  Cyanocobalamin (VITAMIN B 12 PO) Take 2,500 mg by mouth daily.   Yes [provider]  GLIPIZIDE XL 10 MG 24 hr tablet TAKE ONE TABLET BY MOUTH ONCE DAILY WITH  BREAKFAST 04/02/17  Yes Dena Billet B, PA-C  glucose blood test strip 1 each by Other route 2 (two) times daily. Dispense based on Insurance and patient preference.  Check blood sugars twice daily as instructed. 12/24/16  Yes Dixon, Mary B, PA-C  IRON, FERROUS SULFATE, PO Take 2 tablets by mouth at bedtime.   Yes [provider]  metFORMIN (GLUCOPHAGE) 500 MG tablet TAKE ONE TABLET BY MOUTH TWICE DAILY WITH MEALS 05/23/17  Yes Dena Billet B, PA-C  morphine (MS CONTIN) 30 MG 12 hr tablet Take 30 mg by mouth daily as needed. 11/28/16  Yes [provider]  nitroGLYCERIN (NITROLINGUAL) 0.4 MG/SPRAY spray USE ONE TO TWO SPRAYS UNDER THE TONGUE EVERY 5 MINUTES AS NEEDED FOR CHEST PAIN 09/06/14  Yes Croitoru, Mihai, MD  sertraline (ZOLOFT) 50 MG tablet TAKE ONE TABLET BY MOUTH ONCE DAILY FOR 2 WEEKS, TAKE 2 TABLETS  ONCE DAILY 05/23/17  Yes Dena Billet B,  PA-C  vitamin A 10000 UNIT capsule Take 10,000 Units by mouth daily.   Yes [provider]  Vitamin D, Ergocalciferol, (DRISDOL) 50000 UNITS CAPS capsule Take 1 capsule (50,000 Units total) by mouth every 7 (seven) days. 10/27/15  Yes Dixon, Mary B, PA-C  umeclidinium-vilanterol (ANORO ELLIPTA) 62.5-25 MCG/INH AEPB Inhale 1 puff into the lungs daily. 08/09/17   Deneise Lever, MD   Past Medical History:  Diagnosis Date  . Allergy   . Anxiety   . Arthritis   . Asthma   . 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 Bilateral carotid artery occlusion 09/25/2013  . CAD s/p CABG 1994 09/25/2013   LIMA-LAD, SVG Diag, SVG-RCA, Hendrickson Cath 2010: Stents in the proximal LAD with 40% in-stent restenosis and 50% post-stent stenosis, 80% small OM 1, 70% small OM 2, 40% first PLA; diffuse 70% distal RCA; patent SVG to diagonal, patent SVG to distal RCA, patent atretic LIMA EF 55%; normal nuclear stress test 2011  . Cataract   . CHF (congestive heart failure) ([place])   . Chronic anemia   . Depression   . Diabetes mellitus without complication ([place])   . Diastolic dysfunction   . GERD (gastroesophageal reflux disease)   . Glaucoma   . Heart murmur   . History of recurrent UTIs   . Hyperlipidemia   . Hypertension   . Hypocalcemia   . Ischemic heart disease   . Migraine   . Morbid obesity ([place])    s/p gastric bypass  . Osteoporosis   . Peroneal neuropathy   . Retinopathy   . Stroke [facility])    Past Surgical History:  Procedure Laterality Date  . APPENDECTOMY    . CARDIAC CATHETERIZATION  03/30/2009   3 vessel CAD,patent grafts  . CHOLECYSTECTOMY    . CORONARY ARTERY BYPASS GRAFT  1994   LIM to LAD,SVG to first diagonal,SVG to distal RCA  . GASTRIC BYPASS  2002  . HEEL SPUR EXCISION  2012  . HERNIA REPAIR    . NM MYOCAR PERF WALL MOTION  2011   no ischemia  . RADIOFREQUENCY ABLATION     AVNRT - successful  . SMALL INTESTINE SURGERY    . TOTAL HIP ARTHROPLASTY Right  12/27/2014   Procedure: TOTAL HIP ARTHROPLASTY ANTERIOR APPROACH;  Surgeon: Alta Corning, MD;  Location: [facility];  Service: Orthopedics;  Laterality: Right;  . TRIGGER FINGER RELEASE  2012  . US ECHOCARDIOGRAPHY  05/09/2010   mild MR,mild mitral and aortic sclerosis  . VAGINAL HYSTERECTOMY    . VENTRAL HERNIA REPAIR     Family History  Problem Relation Age of Onset  . Diabetes Brother   . Asthma Brother   . Depression Brother   . Asthma Brother   . Depression Brother   . Diabetes Brother   . Cancer Father        skin  . Alcohol abuse Father   . Arthritis Father   . Depression Father   . Hypertension Father   . Alzheimer's disease Father   . Diabetes Mother   . Heart failure Mother   . Arthritis Mother   .  Heart disease Mother   . Asthma Sister   . Heart attack Sister   . Depression Sister   . Diabetes Sister   . Drug abuse Sister   . Heart disease Sister   . Mental illness Sister   . Heart attack Son   . Hypertension Son   . Kidney disease Son        transplant 2003  . Vision loss Son    Social History   Social History  . Marital status: Divorced    Spouse name: N/A  . Number of children: 2  . Years of education: N/A   Occupational History  . disabled    Social History Main Topics  . Smoking status: Former Smoker    Packs/day: 2.00    Quit date: 12/18/1987  . Smokeless tobacco: Never Used     Comment: Pt unsure how many years she smoked total.  . Alcohol use No  . Drug use: No  . Sexual activity: Not Currently   Other Topics Concern  . Not on file   Social History Narrative   She used to work with the  [employer]. 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   Lives at home w/ her little dog   Right-handed   Caffeine: occasional tea   ROS-see HPI   + = Positive Constitutional:    weight loss, night sweats, fevers, chills, fatigue, lassitude. HEENT:   + headaches, difficulty swallowing, tooth/dental  problems, sore throat,       sneezing, itching, ear ache, nasal congestion, post nasal drip, snoring CV:    chest pain, orthopnea, PND, swelling in lower extremities, anasarca,                                                dizziness, palpitations Resp:   + shortness of breath with exertion or at rest.                productive cough,   non-productive cough, coughing up of blood.              change in color of mucus.  wheezing.   Skin:    rash or lesions. GI:  +   heartburn, indigestion, abdominal pain, nausea, vomiting, diarrhea,                 change in bowel habits, loss of appetite GU: dysuria, change in color of urine, no urgency or frequency.   flank pain. MS:   joint pain, stiffness, decreased range of motion, back pain. Neuro-     nothing unusual Psych:  change in mood or affect.  + depression or anxiety.   memory loss.  OBJ- Physical Exam General- Alert, Oriented, Affect-appropriate, Distress- none acute Skin- rash-none, lesions- none, excoriation- none, + very pale Lymphadenopathy- none Head- atraumatic            Eyes- Gross vision intact, PERRLA, conjunctivae and secretions clear            Ears- Hearing, canals-normal            Nose- Clear, no-Septal dev, mucus, polyps, erosion, perforation             Throat- Mallampati II , mucosa clear , drainage- none, tonsils- atrophic Neck- flexible , trachea midline, no stridor , thyroid nl, carotid  no bruit Chest - symmetrical excursion , unlabored           Heart/CV- RRR , no murmur , no gallop  , no rub, nl s1 s2                           - JVD- none , edema- none, stasis changes- none, varices- none           Lung- clear to P&A, wheeze- none, cough- none , dullness-none, rub- none           Chest wall-  Abd-  Br/ Gen/ Rectal- Not done, not indicated Extrem- cyanosis- none, clubbing, none, atrophy- none, strength- nl Neuro- she demonstrated memory deficit, unable to remember details of her previous sleep study

## 2017-08-09 NOTE — Assessment & Plan Note (Addendum)
Presumptive diagnosis of COPD recognizing her history of heavy smoking, reports of dyspnea on exertion and demonstrated sleep hypoxemia.. The diagnosis is not supported by her walk test or office spirometry. Plan-try sample of Anoro

## 2017-08-10 ENCOUNTER — Telehealth: Payer: Self-pay | Admitting: Internal Medicine

## 2017-08-10 NOTE — Telephone Encounter (Signed)
Did not understand how to use anoro, explained

## 2017-08-10 NOTE — Progress Notes (Signed)
I agree fully with your assessment and plan . Than you for seeing her .  Hopefully she can benefit from a dedicated PAP titration. Marland Kitchen   Leontae Bostock, MD

## 2017-08-13 ENCOUNTER — Ambulatory Visit (HOSPITAL_BASED_OUTPATIENT_CLINIC_OR_DEPARTMENT_OTHER): Payer: Medicare Other | Attending: Internal Medicine | Admitting: Internal Medicine

## 2017-08-13 DIAGNOSIS — G4733 Obstructive sleep apnea (adult) (pediatric): Secondary | ICD-10-CM | POA: Insufficient documentation

## 2017-08-13 DIAGNOSIS — G4761 Periodic limb movement disorder: Secondary | ICD-10-CM | POA: Insufficient documentation

## 2017-08-13 DIAGNOSIS — G4730 Sleep apnea, unspecified: Secondary | ICD-10-CM | POA: Diagnosis present

## 2017-08-13 DIAGNOSIS — G4731 Primary central sleep apnea: Secondary | ICD-10-CM

## 2017-08-16 ENCOUNTER — Ambulatory Visit (INDEPENDENT_AMBULATORY_CARE_PROVIDER_SITE_OTHER): Payer: Medicare Other | Admitting: Neurology

## 2017-08-16 ENCOUNTER — Encounter: Payer: Self-pay | Admitting: Neurology

## 2017-08-16 VITALS — BP 155/67 | HR 81 | Ht 63.0 in | Wt 143.6 lb

## 2017-08-16 DIAGNOSIS — I6523 Occlusion and stenosis of bilateral carotid arteries: Secondary | ICD-10-CM | POA: Diagnosis not present

## 2017-08-16 DIAGNOSIS — G4733 Obstructive sleep apnea (adult) (pediatric): Secondary | ICD-10-CM

## 2017-08-16 NOTE — Progress Notes (Signed)
[facility]    Provider:  Dr Jaynee Eagles Referring Provider: Cathlean Sauer, MD Primary Care Physician:  Cathlean Sauer, MD  CC:  Memory loss  Interval history: Since seeing me she was evaluated by my colleague Dr. Brett Fairy, sleep evaluation showed severe central dominant complex sleep apnea and severely low oxygen. She needs to treat her severe sleep apnea and then come back for evaluation of memory as severe sleep apnea could be significantly contributing.  Discussed MRi of the brain and showed images, needs ASA 325 daily for stroke prevention and close management of vascular risk factors.   MRI brain: IMPRESSION:  Abnormal MRI scan of the brain showing remote age area of  encephalomalacia and gliosis in the right superior frontal region as well as mild changes of chronic microvascular ischemia as well as generalized cerebral atrophy. Overall slight progression compared with CT head dated 02/06/2008    HPI:  [name] is a 66 y.o. female here as a referral from Dr. Doren Custard for memory changes. Past medical history depression, anxiety, bilateral carotid artery occlusion, coronary artery disease status post CABG 1994, congestive heart failure, chronic anemia, diabetes, glaucoma, hyperlipidemia, hypertension, morbid obesity status post gastric bypass, stroke, severe protein calorie malnutrition, multiple cardiac problems.    Gradual memory loss for 2-3 years. Brother is here and provides much information. She forgets dates. She forgets to get her medicine. Forgets to take her medicine. More short-term memory changes pe rpatient. Brother says lately she has forgotten some people temporarily but then remembered them. She forgets names. All her bills are paid electronically but has missed bills. She lives alone independently. She fell recently unclear why. She still cooks. Brother came to the house and seemed normal.  Brother notices her repeating things sometimes, asks when her  appointments are multiple times. She is having difficulty knowing the date. She also feels depression is involved as well but she feels she has memory problems not related to the depression. Her father had Alzheimer's in his 17s. She had bariatric surgery and stopped taking meds and all her levels were low.She snores, she has woken herself up by stopping breathing and she is very tired.   Reviewed notes, labs and imaging from outside physicians, which showed:  Reviewed primary care notes from [facility]. April has been a hard month for her because this is the month her husband died in the past. She is also headache her animals die in April. She has been receiving grief therapy. She also has trouble sleeping. She has trouble initiating sleep. She is on Celexa because she feels sad and depressed, cries very easily. She was on Zoloft in the past as well. She is a former smoker. No alcohol use, no drug use. Never used smokeless tobacco. She also has ongoing GI problems.  Reviewed notes from you and [facility]. 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 Montral cognitive assessment score was 20 out of 30. She was started on donepezil. Patient has a history of memory loss. She often doesn't remember places she has been. Her brother and sister. are concerned with this. Her father had Alzheimer's dementia.   HEAD CT WITHOUT CONTRAST:  Reviewed images and agree with the following   Findings: Chronic atrophy and gliosis in the right frontal lobe appears the same. This could be ischemic or post-traumatic. The remainder of the brain shows some old small vessel insults in the deep white matter. No sign of acute stroke, mass,  hemorrhage, hydrocephalus, or extra-axial collection. Sinuses, middle ears and mastoids are clear. Patient has sebaceous cysts of the scalp which are chronic findings.  IMPRESSION:  No change. No acute  finding. Old right frontal stroke. Small-vessel changes elsewhere throughout the brain.   Review of Systems: Patient complains of symptoms per HPI as well as the following symptoms: no CP, no SOB, snoring. Pertinent negatives per HPI. All others negative.   Social History   Social History  . Marital status: Divorced    Spouse name: N/A  . Number of children: 2  . Years of education: N/A   Occupational History  . disabled    Social History Main Topics  . Smoking status: Former Smoker    Packs/day: 2.00    Quit date: 12/18/1987  . Smokeless tobacco: Never Used     Comment: Pt unsure how many years she smoked total.  . Alcohol use No  . Drug use: No  . Sexual activity: Not Currently   Other Topics Concern  . Not on file   Social History Narrative   She used to work with the  [employer]. 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   Lives at home w/ her little dog   Right-handed   Caffeine: occasional tea    Family History  Problem Relation Age of Onset  . Diabetes Brother   . Asthma Brother   . Depression Brother   . Asthma Brother   . Depression Brother   . Diabetes Brother   . Cancer Father        skin  . Alcohol abuse Father   . Arthritis Father   . Depression Father   . Hypertension Father   . Alzheimer's disease Father   . Diabetes Mother   . Heart failure Mother   . Arthritis Mother   . Heart disease Mother   . Asthma Sister   . Heart attack Sister   . Depression Sister   . Diabetes Sister   . Drug abuse Sister   . Heart disease Sister   . Mental illness Sister   . Heart attack Son   . Hypertension Son   . Kidney disease Son        transplant 2003  . Vision loss Son     Past Medical History:  Diagnosis Date  . Allergy   . Anxiety   . Arthritis   . Asthma   . 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 Bilateral carotid artery occlusion 09/25/2013  . CAD s/p CABG 1994 09/25/2013    LIMA-LAD, SVG Diag, SVG-RCA, Hendrickson Cath 2010: Stents in the proximal LAD with 40% in-stent restenosis and 50% post-stent stenosis, 80% small OM 1, 70% small OM 2, 40% first PLA; diffuse 70% distal RCA; patent SVG to diagonal, patent SVG to distal RCA, patent atretic LIMA EF 55%; normal nuclear stress test 2011  . Cataract   . CHF (congestive heart failure) ([place])   . Chronic anemia   . Depression   . Diabetes mellitus without complication ([place])   . Diastolic dysfunction   . GERD (gastroesophageal reflux disease)   . Glaucoma   . Heart murmur   . History of recurrent UTIs   . Hyperlipidemia   . Hypertension   . Hypocalcemia   . Ischemic heart disease   . Migraine   . Morbid obesity ([place])    s/p gastric bypass  . Osteoporosis   . Peroneal neuropathy   .  Retinopathy   . Stroke [facility])     Past Surgical History:  Procedure Laterality Date  . APPENDECTOMY    . CARDIAC CATHETERIZATION  03/30/2009   3 vessel CAD,patent grafts  . CHOLECYSTECTOMY    . CORONARY ARTERY BYPASS GRAFT  1994   LIM to LAD,SVG to first diagonal,SVG to distal RCA  . GASTRIC BYPASS  2002  . HEEL SPUR EXCISION  2012  . HERNIA REPAIR    . NM MYOCAR PERF WALL MOTION  2011   no ischemia  . RADIOFREQUENCY ABLATION     AVNRT - successful  . SMALL INTESTINE SURGERY    . TOTAL HIP ARTHROPLASTY Right 12/27/2014   Procedure: TOTAL HIP ARTHROPLASTY ANTERIOR APPROACH;  Surgeon: Alta Corning, MD;  Location: [facility];  Service: Orthopedics;  Laterality: Right;  . TRIGGER FINGER RELEASE  2012  . US ECHOCARDIOGRAPHY  05/09/2010   mild MR,mild mitral and aortic sclerosis  . VAGINAL HYSTERECTOMY    . VENTRAL HERNIA REPAIR      Current Outpatient Prescriptions  Medication Sig Dispense Refill  . acetaminophen (TYLENOL) 500 MG tablet Take 1,500 mg by mouth every 6 (six) hours as needed for mild pain.     Marland Kitchen albuterol (PROVENTIL HFA;VENTOLIN HFA) 108 (90 Base) MCG/ACT inhaler Inhale 2 puffs into the lungs every 6 (six) hours  as needed for wheezing or shortness of breath. 1 Inhaler 0  . ALPRAZolam (XANAX) 0.5 MG tablet Take 1 tablet (0.5 mg total) by mouth at bedtime as needed for anxiety. 15 tablet 0  . aspirin 81 MG tablet Take 81 mg by mouth daily.    . blood glucose meter kit and supplies KIT Dispense based on patient and insurance preference.  Check blood sugar twice daily as directed. 1 each 0  . CALCIUM CITRATE PO Take 1,500 mg by mouth 3 (three) times daily.    . Cyanocobalamin (VITAMIN B 12 PO) Take 2,500 mg by mouth daily.    Marland Kitchen GLIPIZIDE XL 10 MG 24 hr tablet TAKE ONE TABLET BY MOUTH ONCE DAILY WITH  BREAKFAST 90 tablet 2  . glucose blood test strip 1 each by Other route 2 (two) times daily. Dispense based on Insurance and patient preference.  Check blood sugars twice daily as instructed. 100 each 12  . IRON, FERROUS SULFATE, PO Take 2 tablets by mouth at bedtime.    . metFORMIN (GLUCOPHAGE) 500 MG tablet TAKE ONE TABLET BY MOUTH TWICE DAILY WITH MEALS 60 tablet 0  . morphine (MS CONTIN) 30 MG 12 hr tablet Take 30 mg by mouth daily as needed.    . nitroGLYCERIN (NITROLINGUAL) 0.4 MG/SPRAY spray USE ONE TO TWO SPRAYS UNDER THE TONGUE EVERY 5 MINUTES AS NEEDED FOR CHEST PAIN 12 g 5  . sertraline (ZOLOFT) 50 MG tablet TAKE ONE TABLET BY MOUTH ONCE DAILY FOR 2 WEEKS, TAKE 2 TABLETS  ONCE DAILY 60 tablet 0  . umeclidinium-vilanterol (ANORO ELLIPTA) 62.5-25 MCG/INH AEPB Inhale 1 puff into the lungs daily. 1 each 0  . vitamin A 10000 UNIT capsule Take 10,000 Units by mouth daily.    . Vitamin D, Ergocalciferol, (DRISDOL) 50000 UNITS CAPS capsule Take 1 capsule (50,000 Units total) by mouth every 7 (seven) days. 12 capsule 1   No current facility-administered medications for this visit.     Allergies as of 08/16/2017 - Review Complete 08/16/2017  Allergen Reaction Noted  . Latex  03/30/2014  . Metoclopramide hcl  04/08/2012  . Neurontin [gabapentin]  04/08/2012  .  Penicillins  04/08/2012    Vitals: BP (!)  155/67 (BP Location: Right Arm, Patient Position: Sitting, Cuff Size: Normal)   Pulse 81   Ht '5\' 3"'$  (1.6 m)   Wt 143 lb 9.6 oz (65.1 kg)   BMI 25.44 kg/m  Last Weight:  Wt Readings from Last 1 Encounters:  08/16/17 143 lb 9.6 oz (65.1 kg)   Last Height:   Ht Readings from Last 1 Encounters:  08/16/17 '5\' 3"'$  (1.6 m)   MMSE - Mini Mental State Exam 04/15/2017  Orientation to time 4  Orientation to Place 5  Registration 3  Attention/ Calculation 5  Recall 1  Language- name 2 objects 2  Language- repeat 1  Language- follow 3 step command 3  Language- read & follow direction 1  Write a sentence 1  Copy design 1  Total score 27    Assessment/Plan:   66 y.o. female here as a referral from Dr. Doren Custard for memory changes. Past medical history depression, anxiety, bilateral carotid artery occlusion, coronary artery disease status post CABG 1994, congestive heart failure, chronic anemia, diabetes, glaucoma, hyperlipidemia, hypertension, morbid obesity status post gastric bypass, stroke, severe protein calorie malnutrition, multiple cardiac problems.  Severe complex sleep apnea could be signifcantly contributing to her memory changes, will see her back in 6 months after continuous treatment of OSA. MRI brain did not show etiology or reversible cause of memory loss but discussed encephalomalacia of unknown cause, taking ASA daily and managing stroke risk factors thyroid, rpr and b12 normal Recommend formal neuropsychiatric testing to evaluate for pseudodementia after sleep evaluation and treatment if still has memory problems ASA '325mg'$  daily  I had a long d/w patient about encephalomalacia in the brain, personally independently reviewed imaging studies and evaluation results and answered questions.Continue ASA 325 for secondary stroke prevention and maintain strict control of hypertension with blood pressure goal below 130/90, diabetes with hemoglobin A1c goal below 6.5% and lipids with LDL  cholesterol goal below 70 mg/dL. I also advised the patient to eat a healthy diet with plenty of whole grains, cereals, fruits and vegetables, exercise regularly and maintain ideal body weight .Followup in the future with me in 6 months or call earlier if necessary. Sarina Ill, MD  [facility] [address] [place], [place] [address]  Phone [phone] Fax [phone]  A total of 25 minutes was spent face-to-face with this patient. Over half this time was spent on counseling patient on the OSA, memory loss, MRI brain image review, diagnosis and different diagnostic and therapeutic options available.

## 2017-08-16 NOTE — Patient Instructions (Signed)
Remember to drink plenty of fluid, eat healthy meals and do not skip any meals. Try to eat protein with a every meal and eat a healthy snack such as fruit or nuts in between meals. Try to keep a regular sleep-wake schedule and try to exercise daily, particularly in the form of walking, 20-30 minutes a day, if you can.   I would like to see you back in 6 months, sooner if we need to. Please call us with any interim questions, concerns, problems, updates or refill requests.   Our phone number is [phone]. We also have an after hours call service for urgent matters and there is a physician on-call for urgent questions. For any emergencies you know to call 911 or go to the nearest emergency room   Sleep Apnea Sleep apnea is a condition in which breathing pauses or becomes shallow during sleep. Episodes of sleep apnea usually last 10 seconds or longer, and they may occur as many as 20 times an hour. Sleep apnea disrupts your sleep and keeps your body from getting the rest that it needs. This condition can increase your risk of certain health problems, including:  Heart attack.  Stroke.  Obesity.  Diabetes.  Heart failure.  Irregular heartbeat.  There are three kinds of sleep apnea:  Obstructive sleep apnea. This kind is caused by a blocked or collapsed airway.  Central sleep apnea. This kind happens when the part of the brain that controls breathing does not send the correct signals to the muscles that control breathing.  Mixed sleep apnea. This is a combination of obstructive and central sleep apnea.  What are the causes? The most common cause of this condition is a collapsed or blocked airway. An airway can collapse or become blocked if:  Your throat muscles are abnormally relaxed.  Your tongue and tonsils are larger than normal.  You are overweight.  Your airway is smaller than normal.  What increases the risk? This condition is more likely to develop in people who:  Are  overweight.  Smoke.  Have a smaller than normal airway.  Are elderly.  Are female.  Drink alcohol.  Take sedatives or tranquilizers.  Have a family history of sleep apnea.  What are the signs or symptoms? Symptoms of this condition include:  Trouble staying asleep.  Daytime sleepiness and tiredness.  Irritability.  Loud snoring.  Morning headaches.  Trouble concentrating.  Forgetfulness.  Decreased interest in sex.  Unexplained sleepiness.  Mood swings.  Personality changes.  Feelings of depression.  Waking up often during the night to urinate.  Dry mouth.  Sore throat.  How is this diagnosed? This condition may be diagnosed with:  A medical history.  A physical exam.  A series of tests that are done while you are sleeping (sleep study). These tests are usually done in a sleep lab, but they may also be done at home.  How is this treated? Treatment for this condition aims to restore normal breathing and to ease symptoms during sleep. It may involve managing health issues that can affect breathing, such as high blood pressure or obesity. Treatment may include:  Sleeping on your side.  Using a decongestant if you have nasal congestion.  Avoiding the use of depressants, including alcohol, sedatives, and narcotics.  Losing weight if you are overweight.  Making changes to your diet.  Quitting smoking.  Using a device to open your airway while you sleep, such as: ? An oral appliance. This is a custom-made  mouthpiece that shifts your lower jaw forward. ? A continuous positive airway pressure (CPAP) device. This device delivers oxygen to your airway through a mask. ? A nasal expiratory positive airway pressure (EPAP) device. This device has valves that you put into each nostril. ? A bi-level positive airway pressure (BPAP) device. This device delivers oxygen to your airway through a mask.  Surgery if other treatments do not work. During surgery,  excess tissue is removed to create a wider airway.  It is important to get treatment for sleep apnea. Without treatment, this condition can lead to:  High blood pressure.  Coronary artery disease.  (Men) An inability to achieve or maintain an erection (impotence).  Reduced thinking abilities.  Follow these instructions at home:  Make any lifestyle changes that your health care provider recommends.  Eat a healthy, well-balanced diet.  Take over-the-counter and prescription medicines only as told by your health care provider.  Avoid using depressants, including alcohol, sedatives, and narcotics.  Take steps to lose weight if you are overweight.  If you were given a device to open your airway while you sleep, use it only as told by your health care provider.  Do not use any tobacco products, such as cigarettes, chewing tobacco, and e-cigarettes. If you need help quitting, ask your health care provider.  Keep all follow-up visits as told by your health care provider. This is important. Contact a health care provider if:  The device that you received to open your airway during sleep is uncomfortable or does not seem to be working.  Your symptoms do not improve.  Your symptoms get worse. Get help right away if:  You develop chest pain.  You develop shortness of breath.  You develop discomfort in your back, arms, or stomach.  You have trouble speaking.  You have weakness on one side of your body.  You have drooping in your face. These symptoms may represent a serious problem that is an emergency. Do not wait to see if the symptoms will go away. Get medical help right away. Call your local emergency services (911 in the [place] Do not drive yourself to the hospital. This information is not intended to replace advice given to you by your health care provider. Make sure you discuss any questions you have with your health care provider. Document Released: 11/23/2002 Document  Revised: 07/29/2016 Document Reviewed: 09/12/2015 Elsevier Interactive Patient Education  [employer].

## 2017-08-17 DIAGNOSIS — G4731 Primary central sleep apnea: Secondary | ICD-10-CM

## 2017-08-17 NOTE — Procedures (Signed)
  Patient Name: [name], [name] Date: 08/13/2017 Gender: Female D.O.B: 03-12-1951 Age (years): 47 Referring Provider: Baird Lyons MD, ABSM Height (inches): 63 Interpreting Physician: Baird Lyons MD, ABSM Weight (lbs): 125 RPSGT: Laren Everts BMI: 22 MRN: [id number] Neck Size: 15.00 CLINICAL INFORMATION The patient is referred for a CPAP titration to treat sleep apnea.  Date of NPSG, Split Night or HST: [facility] 06/16/17, AHI 68/ hr, oxygen desaturation to 59%, body weight 144 lbs  SLEEP STUDY TECHNIQUE As per the AASM Manual for the Scoring of Sleep and Associated Events v2.3 (April 2016) with a hypopnea requiring 4% desaturations.  The channels recorded and monitored were frontal, central and occipital EEG, electrooculogram (EOG), submentalis EMG (chin), nasal and oral airflow, thoracic and abdominal wall motion, anterior tibialis EMG, snore microphone, electrocardiogram, and pulse oximetry. Continuous positive airway pressure (CPAP) was initiated at the beginning of the study and titrated to treat sleep-disordered breathing.  MEDICATIONS Medications self-administered by patient taken the night of the study : none reported  TECHNICIAN COMMENTS Comments added by technician: Patient had more than two awakenings to use the bathroom. Patient was restless all through the night.  Comments added by scorer: N/A  RESPIRATORY PARAMETERS Optimal PAP Pressure (cm): 15 AHI at Optimal Pressure (/hr): 0.0 Overall Minimal O2 (%): 92.00 Supine % at Optimal Pressure (%): 100 Minimal O2 at Optimal Pressure (%): 96.0    SLEEP ARCHITECTURE The study was initiated at 9:51:39 PM and ended at 4:31:38 AM.  Sleep onset time was 9.6 minutes and the sleep efficiency was 69.6%. The total sleep time was 278.5 minutes.  The patient spent 7.54% of the night in stage N1 sleep, 43.09% in stage N2 sleep, 0.00% in stage N3 and 49.37% in REM.Stage REM latency was 35.5 minutes  Wake after  sleep onset was 111.8. Alpha intrusion was absent. Supine sleep was 88.15%.  CARDIAC DATA The 2 lead EKG demonstrated sinus rhythm. The mean heart rate was 72.93 beats per minute. Other EKG findings include: None.  LEG MOVEMENT DATA The total Periodic Limb Movements of Sleep (PLMS) were 146. The PLMS index was 31.45. A PLMS index of <15 is considered normal in adults.  IMPRESSIONS - The optimal PAP pressure was 15 cm of water. - Central sleep apnea was not noted during this titration (CAI = 0.0/h). - Significant oxygen desaturations were not observed during this titration (min O2 = 92.00%). - The patient snored with Soft snoring volume during this titration study. - No cardiac abnormalities were observed during this study. - Moderate periodic limb movements were observed during this study. Arousals associated with PLMs were rare.  DIAGNOSIS - Obstructive Sleep Apnea (327.23 [G47.33 ICD-10])  RECOMMENDATIONS - Trial of CPAP therapy on 15 cm H2O with a Small size Resmed Full Face Mask AirFit F20 mask and heated humidification. - Avoid alcohol, sedatives and other CNS depressants that may worsen sleep apnea and disrupt normal sleep architecture. - Sleep hygiene should be reviewed to assess factors that may improve sleep quality. - Weight management and regular exercise should be initiated or continued.  [Electronically signed] 08/17/2017 01:57 PM  Baird Lyons MD, [place], American Board of Sleep Medicine   NPI: [id number]  [place], American Board of Sleep Medicine  ELECTRONICALLY SIGNED ON:  08/17/2017, 1:54 PM [place] PH: [phone]   FX: [phone] [place]

## 2017-08-20 ENCOUNTER — Telehealth: Payer: Self-pay | Admitting: Internal Medicine

## 2017-08-20 DIAGNOSIS — G4733 Obstructive sleep apnea (adult) (pediatric): Secondary | ICD-10-CM

## 2017-08-20 NOTE — Telephone Encounter (Signed)
Pt needs to be set up on CPAP auto 10-20, humidifier, supplies, mask of choice, Air[facility] Dx OSA  Will request Triage assist in calling patient back tomorrow since I am off.

## 2017-08-21 NOTE — Telephone Encounter (Signed)
lmtcb x1 for pt. 

## 2017-08-21 NOTE — Telephone Encounter (Signed)
Pt's brother called back to get results of sleep study.

## 2017-08-22 NOTE — Telephone Encounter (Signed)
Patient's brother [name] is returning phone call. [phone]

## 2017-08-22 NOTE — Telephone Encounter (Signed)
lmtcb

## 2017-08-22 NOTE — Telephone Encounter (Signed)
Called and spoke with pt's borther, [name] (DPR). [name] is aware of results and voiced his understanding. Order for cpap has been placed. Pt is scheduled for f/u with CY on 11/12/17 Nothing further needed.

## 2017-08-31 ENCOUNTER — Emergency Department (HOSPITAL_COMMUNITY): Payer: Medicare Other

## 2017-08-31 ENCOUNTER — Encounter (HOSPITAL_COMMUNITY): Payer: Self-pay | Admitting: Emergency Medicine

## 2017-08-31 ENCOUNTER — Inpatient Hospital Stay (HOSPITAL_COMMUNITY)
Admission: EM | Admit: 2017-08-31 | Discharge: 2017-09-04 | DRG: 557 | Disposition: A | Discharge status: Home / Self Care | Payer: Medicare Other | Attending: Family Medicine | Admitting: Family Medicine

## 2017-08-31 DIAGNOSIS — IMO0001 Reserved for inherently not codable concepts without codable children: Secondary | ICD-10-CM | POA: Diagnosis present

## 2017-08-31 DIAGNOSIS — Z821 Family history of blindness and visual loss: Secondary | ICD-10-CM

## 2017-08-31 DIAGNOSIS — Z9049 Acquired absence of other specified parts of digestive tract: Secondary | ICD-10-CM

## 2017-08-31 DIAGNOSIS — E876 Hypokalemia: Secondary | ICD-10-CM

## 2017-08-31 DIAGNOSIS — Z833 Family history of diabetes mellitus: Secondary | ICD-10-CM

## 2017-08-31 DIAGNOSIS — Z79899 Other long term (current) drug therapy: Secondary | ICD-10-CM

## 2017-08-31 DIAGNOSIS — N390 Urinary tract infection, site not specified: Secondary | ICD-10-CM | POA: Diagnosis present

## 2017-08-31 DIAGNOSIS — K920 Hematemesis: Secondary | ICD-10-CM

## 2017-08-31 DIAGNOSIS — R6510 Systemic inflammatory response syndrome (SIRS) of non-infectious origin without acute organ dysfunction: Secondary | ICD-10-CM | POA: Diagnosis present

## 2017-08-31 DIAGNOSIS — N3000 Acute cystitis without hematuria: Secondary | ICD-10-CM

## 2017-08-31 DIAGNOSIS — Z951 Presence of aortocoronary bypass graft: Secondary | ICD-10-CM

## 2017-08-31 DIAGNOSIS — Z91040 Latex allergy status: Secondary | ICD-10-CM

## 2017-08-31 DIAGNOSIS — Z820 Family history of epilepsy and other diseases of the nervous system: Secondary | ICD-10-CM

## 2017-08-31 DIAGNOSIS — E1165 Type 2 diabetes mellitus with hyperglycemia: Secondary | ICD-10-CM | POA: Diagnosis present

## 2017-08-31 DIAGNOSIS — Z818 Family history of other mental and behavioral disorders: Secondary | ICD-10-CM

## 2017-08-31 DIAGNOSIS — Z8249 Family history of ischemic heart disease and other diseases of the circulatory system: Secondary | ICD-10-CM

## 2017-08-31 DIAGNOSIS — G4730 Sleep apnea, unspecified: Secondary | ICD-10-CM | POA: Diagnosis present

## 2017-08-31 DIAGNOSIS — G92 Toxic encephalopathy: Secondary | ICD-10-CM | POA: Diagnosis present

## 2017-08-31 DIAGNOSIS — T402X1A Poisoning by other opioids, accidental (unintentional), initial encounter: Secondary | ICD-10-CM | POA: Diagnosis present

## 2017-08-31 DIAGNOSIS — Z90710 Acquired absence of both cervix and uterus: Secondary | ICD-10-CM

## 2017-08-31 DIAGNOSIS — N179 Acute kidney failure, unspecified: Secondary | ICD-10-CM | POA: Diagnosis present

## 2017-08-31 DIAGNOSIS — Z8261 Family history of arthritis: Secondary | ICD-10-CM

## 2017-08-31 DIAGNOSIS — Z9884 Bariatric surgery status: Secondary | ICD-10-CM

## 2017-08-31 DIAGNOSIS — G4731 Primary central sleep apnea: Secondary | ICD-10-CM

## 2017-08-31 DIAGNOSIS — Z888 Allergy status to other drugs, medicaments and biological substances status: Secondary | ICD-10-CM

## 2017-08-31 DIAGNOSIS — M6282 Rhabdomyolysis: Principal | ICD-10-CM | POA: Diagnosis present

## 2017-08-31 DIAGNOSIS — Z880 Allergy status to penicillin: Secondary | ICD-10-CM

## 2017-08-31 DIAGNOSIS — K219 Gastro-esophageal reflux disease without esophagitis: Secondary | ICD-10-CM | POA: Diagnosis present

## 2017-08-31 DIAGNOSIS — Z87891 Personal history of nicotine dependence: Secondary | ICD-10-CM

## 2017-08-31 DIAGNOSIS — I2510 Atherosclerotic heart disease of native coronary artery without angina pectoris: Secondary | ICD-10-CM | POA: Diagnosis present

## 2017-08-31 DIAGNOSIS — T796XXA Traumatic ischemia of muscle, initial encounter: Secondary | ICD-10-CM

## 2017-08-31 DIAGNOSIS — Y92009 Unspecified place in unspecified non-institutional (private) residence as the place of occurrence of the external cause: Secondary | ICD-10-CM

## 2017-08-31 DIAGNOSIS — Z811 Family history of alcohol abuse and dependence: Secondary | ICD-10-CM

## 2017-08-31 DIAGNOSIS — F419 Anxiety disorder, unspecified: Secondary | ICD-10-CM | POA: Diagnosis present

## 2017-08-31 DIAGNOSIS — W19XXXA Unspecified fall, initial encounter: Secondary | ICD-10-CM

## 2017-08-31 DIAGNOSIS — F11921 Opioid use, unspecified with intoxication delirium: Secondary | ICD-10-CM

## 2017-08-31 DIAGNOSIS — J449 Chronic obstructive pulmonary disease, unspecified: Secondary | ICD-10-CM | POA: Diagnosis present

## 2017-08-31 DIAGNOSIS — I5032 Chronic diastolic (congestive) heart failure: Secondary | ICD-10-CM | POA: Diagnosis present

## 2017-08-31 DIAGNOSIS — Z7982 Long term (current) use of aspirin: Secondary | ICD-10-CM

## 2017-08-31 DIAGNOSIS — A419 Sepsis, unspecified organism: Secondary | ICD-10-CM

## 2017-08-31 DIAGNOSIS — Z825 Family history of asthma and other chronic lower respiratory diseases: Secondary | ICD-10-CM

## 2017-08-31 DIAGNOSIS — I4510 Unspecified right bundle-branch block: Secondary | ICD-10-CM | POA: Diagnosis present

## 2017-08-31 DIAGNOSIS — Z7984 Long term (current) use of oral hypoglycemic drugs: Secondary | ICD-10-CM

## 2017-08-31 DIAGNOSIS — Z96641 Presence of right artificial hip joint: Secondary | ICD-10-CM | POA: Diagnosis present

## 2017-08-31 DIAGNOSIS — I110 Hypertensive heart disease with heart failure: Secondary | ICD-10-CM | POA: Diagnosis present

## 2017-08-31 DIAGNOSIS — G9340 Encephalopathy, unspecified: Secondary | ICD-10-CM | POA: Diagnosis present

## 2017-08-31 DIAGNOSIS — E785 Hyperlipidemia, unspecified: Secondary | ICD-10-CM | POA: Diagnosis present

## 2017-08-31 DIAGNOSIS — D509 Iron deficiency anemia, unspecified: Secondary | ICD-10-CM | POA: Diagnosis present

## 2017-08-31 DIAGNOSIS — G8929 Other chronic pain: Secondary | ICD-10-CM | POA: Diagnosis present

## 2017-08-31 DIAGNOSIS — Z808 Family history of malignant neoplasm of other organs or systems: Secondary | ICD-10-CM

## 2017-08-31 LAB — CBG MONITORING, ED: GLUCOSE-CAPILLARY: 289 mg/dL — AB (ref 65–99)

## 2017-08-31 MED ORDER — SODIUM CHLORIDE 0.9 % IV BOLUS (SEPSIS)
1000.0000 mL | Freq: Once | INTRAVENOUS | Status: AC
  Administered 2017-09-01: 1000 mL via INTRAVENOUS

## 2017-08-31 MED ORDER — AZTREONAM 2 G IJ SOLR
2.0000 g | Freq: Once | INTRAMUSCULAR | Status: AC
  Administered 2017-09-01: 2 g via INTRAVENOUS
  Filled 2017-08-31: qty 2

## 2017-08-31 MED ORDER — ACETAMINOPHEN 650 MG RE SUPP
975.0000 mg | Freq: Once | RECTAL | Status: AC
  Administered 2017-08-31: 975 mg via RECTAL
  Filled 2017-08-31: qty 1

## 2017-08-31 MED ORDER — NALOXONE HCL 2 MG/2ML IJ SOSY
1.0000 mg | PREFILLED_SYRINGE | Freq: Once | INTRAMUSCULAR | Status: AC
  Administered 2017-08-31: 1 mg via INTRAVENOUS

## 2017-08-31 MED ORDER — LEVOFLOXACIN IN D5W 750 MG/150ML IV SOLN
750.0000 mg | Freq: Once | INTRAVENOUS | Status: AC
  Administered 2017-08-31: 750 mg via INTRAVENOUS
  Filled 2017-08-31: qty 150

## 2017-08-31 MED ORDER — NALOXONE HCL 2 MG/2ML IJ SOSY
PREFILLED_SYRINGE | INTRAMUSCULAR | Status: AC
  Filled 2017-08-31: qty 2

## 2017-08-31 MED ORDER — SODIUM CHLORIDE 0.9 % IV SOLN
8.0000 mg/h | INTRAVENOUS | Status: DC
  Administered 2017-09-01 – 2017-09-02 (×4): 8 mg/h via INTRAVENOUS
  Filled 2017-08-31 (×7): qty 80

## 2017-08-31 MED ORDER — SODIUM CHLORIDE 0.9 % IV BOLUS (SEPSIS)
1000.0000 mL | Freq: Once | INTRAVENOUS | Status: AC
  Administered 2017-08-31: 1000 mL via INTRAVENOUS

## 2017-08-31 MED ORDER — SODIUM CHLORIDE 0.9 % IV SOLN
80.0000 mg | Freq: Once | INTRAVENOUS | Status: AC
  Administered 2017-09-01: 80 mg via INTRAVENOUS
  Filled 2017-08-31: qty 80

## 2017-08-31 MED ORDER — VANCOMYCIN HCL IN DEXTROSE 1-5 GM/200ML-% IV SOLN
1000.0000 mg | Freq: Once | INTRAVENOUS | Status: AC
Start: 2017-08-31 — End: 2017-09-01
  Administered 2017-09-01: 1000 mg via INTRAVENOUS
  Filled 2017-08-31: qty 200

## 2017-08-31 MED ORDER — PANTOPRAZOLE SODIUM 40 MG IV SOLR: 40.0000 mg | Freq: Two times a day (BID) | INTRAVENOUS | Status: DC

## 2017-08-31 NOTE — ED Notes (Signed)
Departed charted in error.

## 2017-08-31 NOTE — ED Triage Notes (Signed)
Pt comes from home, found on floor via ems, forced entry, pt found naked and vomiting brown mucus like substances.pt is disoriented and confused, sleepy.  V/s on arrival bp 126/88, rr20, hr96, spo2 95.

## 2017-08-31 NOTE — ED Notes (Signed)
Bed: WA17 Expected date:  Expected time:  Means of arrival:  Comments: 66 yo F/fall, AMS

## 2017-08-31 NOTE — ED Triage Notes (Signed)
cbg [id number]

## 2017-09-01 ENCOUNTER — Emergency Department (HOSPITAL_COMMUNITY): Payer: Medicare Other

## 2017-09-01 ENCOUNTER — Encounter (HOSPITAL_COMMUNITY): Payer: Self-pay | Admitting: Emergency Medicine

## 2017-09-01 DIAGNOSIS — Z811 Family history of alcohol abuse and dependence: Secondary | ICD-10-CM | POA: Diagnosis not present

## 2017-09-01 DIAGNOSIS — T402X1A Poisoning by other opioids, accidental (unintentional), initial encounter: Secondary | ICD-10-CM | POA: Diagnosis present

## 2017-09-01 DIAGNOSIS — T796XXA Traumatic ischemia of muscle, initial encounter: Secondary | ICD-10-CM | POA: Diagnosis not present

## 2017-09-01 DIAGNOSIS — N179 Acute kidney failure, unspecified: Secondary | ICD-10-CM | POA: Diagnosis not present

## 2017-09-01 DIAGNOSIS — G9340 Encephalopathy, unspecified: Secondary | ICD-10-CM | POA: Diagnosis not present

## 2017-09-01 DIAGNOSIS — E1165 Type 2 diabetes mellitus with hyperglycemia: Secondary | ICD-10-CM | POA: Diagnosis not present

## 2017-09-01 DIAGNOSIS — Z8249 Family history of ischemic heart disease and other diseases of the circulatory system: Secondary | ICD-10-CM | POA: Diagnosis not present

## 2017-09-01 DIAGNOSIS — I4510 Unspecified right bundle-branch block: Secondary | ICD-10-CM | POA: Diagnosis present

## 2017-09-01 DIAGNOSIS — Z951 Presence of aortocoronary bypass graft: Secondary | ICD-10-CM | POA: Diagnosis not present

## 2017-09-01 DIAGNOSIS — M6282 Rhabdomyolysis: Secondary | ICD-10-CM | POA: Diagnosis present

## 2017-09-01 DIAGNOSIS — R6510 Systemic inflammatory response syndrome (SIRS) of non-infectious origin without acute organ dysfunction: Secondary | ICD-10-CM | POA: Diagnosis not present

## 2017-09-01 DIAGNOSIS — G4730 Sleep apnea, unspecified: Secondary | ICD-10-CM | POA: Diagnosis present

## 2017-09-01 DIAGNOSIS — N390 Urinary tract infection, site not specified: Secondary | ICD-10-CM | POA: Diagnosis present

## 2017-09-01 DIAGNOSIS — Y92009 Unspecified place in unspecified non-institutional (private) residence as the place of occurrence of the external cause: Secondary | ICD-10-CM | POA: Diagnosis not present

## 2017-09-01 DIAGNOSIS — F11921 Opioid use, unspecified with intoxication delirium: Secondary | ICD-10-CM | POA: Diagnosis present

## 2017-09-01 DIAGNOSIS — G9341 Metabolic encephalopathy: Secondary | ICD-10-CM | POA: Diagnosis not present

## 2017-09-01 DIAGNOSIS — Z808 Family history of malignant neoplasm of other organs or systems: Secondary | ICD-10-CM | POA: Diagnosis not present

## 2017-09-01 DIAGNOSIS — Z820 Family history of epilepsy and other diseases of the nervous system: Secondary | ICD-10-CM | POA: Diagnosis not present

## 2017-09-01 DIAGNOSIS — D509 Iron deficiency anemia, unspecified: Secondary | ICD-10-CM | POA: Diagnosis present

## 2017-09-01 DIAGNOSIS — Z818 Family history of other mental and behavioral disorders: Secondary | ICD-10-CM | POA: Diagnosis not present

## 2017-09-01 DIAGNOSIS — E876 Hypokalemia: Secondary | ICD-10-CM | POA: Diagnosis present

## 2017-09-01 DIAGNOSIS — K920 Hematemesis: Secondary | ICD-10-CM

## 2017-09-01 DIAGNOSIS — N3000 Acute cystitis without hematuria: Secondary | ICD-10-CM | POA: Diagnosis not present

## 2017-09-01 DIAGNOSIS — Z821 Family history of blindness and visual loss: Secondary | ICD-10-CM | POA: Diagnosis not present

## 2017-09-01 DIAGNOSIS — G92 Toxic encephalopathy: Secondary | ICD-10-CM | POA: Diagnosis present

## 2017-09-01 DIAGNOSIS — J449 Chronic obstructive pulmonary disease, unspecified: Secondary | ICD-10-CM | POA: Diagnosis present

## 2017-09-01 DIAGNOSIS — I5032 Chronic diastolic (congestive) heart failure: Secondary | ICD-10-CM | POA: Diagnosis present

## 2017-09-01 DIAGNOSIS — G8929 Other chronic pain: Secondary | ICD-10-CM

## 2017-09-01 DIAGNOSIS — I2510 Atherosclerotic heart disease of native coronary artery without angina pectoris: Secondary | ICD-10-CM

## 2017-09-01 DIAGNOSIS — I110 Hypertensive heart disease with heart failure: Secondary | ICD-10-CM | POA: Diagnosis present

## 2017-09-01 DIAGNOSIS — W19XXXA Unspecified fall, initial encounter: Secondary | ICD-10-CM | POA: Diagnosis present

## 2017-09-01 LAB — URINALYSIS, ROUTINE W REFLEX MICROSCOPIC
Bilirubin Urine: NEGATIVE
Glucose, UA: NEGATIVE mg/dL
Ketones, ur: NEGATIVE mg/dL
NITRITE: NEGATIVE
PROTEIN: 100 mg/dL — AB
Specific Gravity, Urine: 1.024 (ref 1.005–1.030)
pH: 5 (ref 5.0–8.0)

## 2017-09-01 LAB — CBC WITH DIFFERENTIAL/PLATELET
BASOS ABS: 0 10*3/uL (ref 0.0–0.1)
BASOS PCT: 0 %
BASOS PCT: 0 %
Basophils Absolute: 0 10*3/uL (ref 0.0–0.1)
EOS PCT: 0 %
EOS PCT: 0 %
Eosinophils Absolute: 0 10*3/uL (ref 0.0–0.7)
Eosinophils Absolute: 0 10*3/uL (ref 0.0–0.7)
HCT: 30 % — ABNORMAL LOW (ref 36.0–46.0)
HCT: 31.2 % — ABNORMAL LOW (ref 36.0–46.0)
Hemoglobin: 9.2 g/dL — ABNORMAL LOW (ref 12.0–15.0)
Hemoglobin: 9.8 g/dL — ABNORMAL LOW (ref 12.0–15.0)
LYMPHS PCT: 8 %
Lymphocytes Relative: 16 %
Lymphs Abs: 0.9 10*3/uL (ref 0.7–4.0)
Lymphs Abs: 1.6 10*3/uL (ref 0.7–4.0)
MCH: 22.7 pg — ABNORMAL LOW (ref 26.0–34.0)
MCH: 23.4 pg — ABNORMAL LOW (ref 26.0–34.0)
MCHC: 30.7 g/dL (ref 30.0–36.0)
MCHC: 31.4 g/dL (ref 30.0–36.0)
MCV: 73.9 fL — AB (ref 78.0–100.0)
MCV: 74.5 fL — AB (ref 78.0–100.0)
MONO ABS: 0.6 10*3/uL (ref 0.1–1.0)
MONOS PCT: 7 %
Monocytes Absolute: 0.7 10*3/uL (ref 0.1–1.0)
Monocytes Relative: 6 %
Neutro Abs: 7.5 10*3/uL (ref 1.7–7.7)
Neutro Abs: 9.9 10*3/uL — ABNORMAL HIGH (ref 1.7–7.7)
Neutrophils Relative %: 77 %
Neutrophils Relative %: 86 %
PLATELETS: 135 10*3/uL — AB (ref 150–400)
PLATELETS: 176 10*3/uL (ref 150–400)
RBC: 4.06 MIL/uL (ref 3.87–5.11)
RBC: 4.19 MIL/uL (ref 3.87–5.11)
RDW: 16.6 % — AB (ref 11.5–15.5)
RDW: 16.8 % — AB (ref 11.5–15.5)
WBC: 11.5 10*3/uL — AB (ref 4.0–10.5)
WBC: 9.8 10*3/uL (ref 4.0–10.5)

## 2017-09-01 LAB — COMPREHENSIVE METABOLIC PANEL
ALK PHOS: 120 U/L (ref 38–126)
ALK PHOS: 143 U/L — AB (ref 38–126)
ALT: 34 U/L (ref 14–54)
ALT: 45 U/L (ref 14–54)
ANION GAP: 8 (ref 5–15)
ANION GAP: 9 (ref 5–15)
AST: 109 U/L — ABNORMAL HIGH (ref 15–41)
AST: 64 U/L — AB (ref 15–41)
Albumin: 3.1 g/dL — ABNORMAL LOW (ref 3.5–5.0)
Albumin: 3.7 g/dL (ref 3.5–5.0)
BILIRUBIN TOTAL: 0.5 mg/dL (ref 0.3–1.2)
BILIRUBIN TOTAL: 0.7 mg/dL (ref 0.3–1.2)
BUN: 13 mg/dL (ref 6–20)
BUN: 16 mg/dL (ref 6–20)
CALCIUM: 7.4 mg/dL — AB (ref 8.9–10.3)
CALCIUM: 8 mg/dL — AB (ref 8.9–10.3)
CO2: 20 mmol/L — ABNORMAL LOW (ref 22–32)
CO2: 24 mmol/L (ref 22–32)
CREATININE: 0.87 mg/dL (ref 0.44–1.00)
Chloride: 104 mmol/L (ref 101–111)
Chloride: 110 mmol/L (ref 101–111)
Creatinine, Ser: 1.08 mg/dL — ABNORMAL HIGH (ref 0.44–1.00)
GFR calc Af Amer: >60 mL/min (ref >60)
GFR, EST NON AFRICAN AMERICAN: 52 mL/min — AB (ref >60)
Glucose, Bld: 216 mg/dL — ABNORMAL HIGH (ref 65–99)
Glucose, Bld: 288 mg/dL — ABNORMAL HIGH (ref 65–99)
POTASSIUM: 3 mmol/L — AB (ref 3.5–5.1)
Potassium: 3.6 mmol/L (ref 3.5–5.1)
Sodium: 136 mmol/L (ref 135–145)
Sodium: 139 mmol/L (ref 135–145)
TOTAL PROTEIN: 5.9 g/dL — AB (ref 6.5–8.1)
TOTAL PROTEIN: 7 g/dL (ref 6.5–8.1)

## 2017-09-01 LAB — GLUCOSE, CAPILLARY
GLUCOSE-CAPILLARY: 203 mg/dL — AB (ref 65–99)
GLUCOSE-CAPILLARY: 166 mg/dL — AB (ref 65–99)
Glucose-Capillary: 141 mg/dL — ABNORMAL HIGH (ref 65–99)
Glucose-Capillary: 113 mg/dL — ABNORMAL HIGH (ref 65–99)
Glucose-Capillary: 131 mg/dL — ABNORMAL HIGH (ref 65–99)

## 2017-09-01 LAB — IRON AND TIBC
Iron: 18 ug/dL — ABNORMAL LOW (ref 28–170)
Saturation Ratios: 4 % — ABNORMAL LOW (ref 10.4–31.8)
TIBC: 512 ug/dL — ABNORMAL HIGH (ref 250–450)
UIBC: 494 ug/dL

## 2017-09-01 LAB — SALICYLATE LEVEL

## 2017-09-01 LAB — ACETAMINOPHEN LEVEL

## 2017-09-01 LAB — RETICULOCYTES
RBC.: 4.2 MIL/uL (ref 3.87–5.11)
RETIC COUNT ABSOLUTE: 46.2 10*3/uL (ref 19.0–186.0)
Retic Ct Pct: 1.1 % (ref 0.4–3.1)

## 2017-09-01 LAB — TYPE AND SCREEN
ABO/RH(D): O POS
ANTIBODY SCREEN: NEGATIVE

## 2017-09-01 LAB — MAGNESIUM: Magnesium: 1.7 mg/dL (ref 1.7–2.4)

## 2017-09-01 LAB — TROPONIN I
TROPONIN I: 0.03 ng/mL — AB (ref <0.03)
Troponin I: 0.03 ng/mL (ref <0.03)
Troponin I: 0.03 ng/mL (ref <0.03)

## 2017-09-01 LAB — VITAMIN B12: VITAMIN B 12: 1186 pg/mL — AB (ref 180–914)

## 2017-09-01 LAB — HEMOGLOBIN A1C
HEMOGLOBIN A1C: 10.3 % — AB (ref 4.8–5.6)
MEAN PLASMA GLUCOSE: 248.91 mg/dL

## 2017-09-01 LAB — FERRITIN: Ferritin: 6 ng/mL — ABNORMAL LOW (ref 11–307)

## 2017-09-01 LAB — CBG MONITORING, ED: Glucose-Capillary: 245 mg/dL — ABNORMAL HIGH (ref 65–99)

## 2017-09-01 LAB — I-STAT CG4 LACTIC ACID, ED: Lactic Acid, Venous: 0.91 mmol/L (ref 0.5–1.9)

## 2017-09-01 LAB — CK
Total CK: 2993 U/L — ABNORMAL HIGH (ref 38–234)
Total CK: 5247 U/L — ABNORMAL HIGH (ref 38–234)

## 2017-09-01 LAB — FOLATE: FOLATE: 13.3 ng/mL (ref >5.9)

## 2017-09-01 LAB — PROCALCITONIN: Procalcitonin: 0.23 ng/mL

## 2017-09-01 MED ORDER — ONDANSETRON HCL 4 MG/2ML IJ SOLN
4.0000 mg | Freq: Four times a day (QID) | INTRAMUSCULAR | Status: DC | PRN
  Administered 2017-09-03: 4 mg via INTRAVENOUS
  Filled 2017-09-01: qty 2

## 2017-09-01 MED ORDER — DULOXETINE HCL 30 MG PO CPEP
60.0000 mg | ORAL_CAPSULE | Freq: Every day | ORAL | Status: DC
  Administered 2017-09-01 – 2017-09-04 (×4): 60 mg via ORAL
  Filled 2017-09-01 (×5): qty 2

## 2017-09-01 MED ORDER — ONDANSETRON HCL 4 MG PO TABS: 4.0000 mg | ORAL_TABLET | Freq: Four times a day (QID) | ORAL | Status: DC | PRN

## 2017-09-01 MED ORDER — INSULIN ASPART 100 UNIT/ML ~~LOC~~ SOLN: 0.0000 [IU] | Freq: Every day | SUBCUTANEOUS | Status: DC

## 2017-09-01 MED ORDER — POTASSIUM CHLORIDE 10 MEQ/100ML IV SOLN
10.0000 meq | INTRAVENOUS | Status: AC
  Administered 2017-09-01 (×5): 10 meq via INTRAVENOUS
  Filled 2017-09-01 (×4): qty 100

## 2017-09-01 MED ORDER — SODIUM CHLORIDE 0.9 % IV SOLN
INTRAVENOUS | Status: AC
  Administered 2017-09-01 (×2): via INTRAVENOUS

## 2017-09-01 MED ORDER — VANCOMYCIN HCL IN DEXTROSE 750-5 MG/150ML-% IV SOLN
750.0000 mg | Freq: Two times a day (BID) | INTRAVENOUS | Status: DC
  Filled 2017-09-01: qty 150

## 2017-09-01 MED ORDER — LEVOFLOXACIN IN D5W 750 MG/150ML IV SOLN: 750.0000 mg | INTRAVENOUS | Status: DC

## 2017-09-01 MED ORDER — FLUCONAZOLE IN SODIUM CHLORIDE 200-0.9 MG/100ML-% IV SOLN
200.0000 mg | Freq: Every day | INTRAVENOUS | Status: AC
  Administered 2017-09-01 (×2): 200 mg via INTRAVENOUS
  Filled 2017-09-01 (×2): qty 100

## 2017-09-01 MED ORDER — SODIUM CHLORIDE 0.9% FLUSH
3.0000 mL | Freq: Two times a day (BID) | INTRAVENOUS | Status: DC
  Administered 2017-09-01 – 2017-09-04 (×6): 3 mL via INTRAVENOUS

## 2017-09-01 MED ORDER — LORAZEPAM 2 MG/ML IJ SOLN: 0.5000 mg | Freq: Four times a day (QID) | INTRAMUSCULAR | Status: DC | PRN

## 2017-09-01 MED ORDER — VITAMIN B-12 1000 MCG PO TABS
1000.0000 ug | ORAL_TABLET | Freq: Every day | ORAL | Status: DC
  Administered 2017-09-01 – 2017-09-04 (×4): 1000 ug via ORAL
  Filled 2017-09-01 (×4): qty 1

## 2017-09-01 MED ORDER — FLUCONAZOLE IN SODIUM CHLORIDE 200-0.9 MG/100ML-% IV SOLN
200.0000 mg | Freq: Every day | INTRAVENOUS | Status: DC
  Filled 2017-09-01: qty 100

## 2017-09-01 MED ORDER — HYDROCODONE-ACETAMINOPHEN 5-325 MG PO TABS: 1.0000 | ORAL_TABLET | ORAL | Status: DC | PRN

## 2017-09-01 MED ORDER — ACETAMINOPHEN 325 MG PO TABS: 650.0000 mg | ORAL_TABLET | Freq: Four times a day (QID) | ORAL | Status: DC | PRN

## 2017-09-01 MED ORDER — ALBUTEROL SULFATE (2.5 MG/3ML) 0.083% IN NEBU
2.5000 mg | INHALATION_SOLUTION | RESPIRATORY_TRACT | Status: DC | PRN
Start: 2017-09-01 — End: 2017-09-04

## 2017-09-01 MED ORDER — DEXTROSE 5 % IV SOLN
1.0000 g | INTRAVENOUS | Status: DC
  Administered 2017-09-01: 1 g via INTRAVENOUS
  Filled 2017-09-01: qty 1

## 2017-09-01 MED ORDER — INSULIN ASPART 100 UNIT/ML ~~LOC~~ SOLN
0.0000 [IU] | Freq: Three times a day (TID) | SUBCUTANEOUS | Status: DC
  Administered 2017-09-01: 1 [IU] via SUBCUTANEOUS
  Administered 2017-09-01: 2 [IU] via SUBCUTANEOUS
  Administered 2017-09-02 (×2): 1 [IU] via SUBCUTANEOUS
  Administered 2017-09-02 – 2017-09-03 (×2): 2 [IU] via SUBCUTANEOUS
  Administered 2017-09-03: 3 [IU] via SUBCUTANEOUS
  Administered 2017-09-03 – 2017-09-04 (×3): 1 [IU] via SUBCUTANEOUS
  Administered 2017-09-04: 2 [IU] via SUBCUTANEOUS

## 2017-09-01 MED ORDER — ACETAMINOPHEN 650 MG RE SUPP: 650.0000 mg | Freq: Four times a day (QID) | RECTAL | Status: DC | PRN

## 2017-09-01 MED ORDER — MORPHINE SULFATE ER 15 MG PO TBCR
15.0000 mg | EXTENDED_RELEASE_TABLET | Freq: Two times a day (BID) | ORAL | Status: DC
  Administered 2017-09-02 – 2017-09-03 (×3): 15 mg via ORAL
  Filled 2017-09-01 (×3): qty 1

## 2017-09-01 MED ORDER — AZTREONAM IN DEXTROSE 1 GM/50ML IV SOLN
1.0000 g | Freq: Three times a day (TID) | INTRAVENOUS | Status: DC
  Administered 2017-09-01: 1 g via INTRAVENOUS
  Filled 2017-09-01 (×3): qty 50

## 2017-09-01 MED ORDER — UMECLIDINIUM-VILANTEROL 62.5-25 MCG/INH IN AEPB
1.0000 | INHALATION_SPRAY | Freq: Every day | RESPIRATORY_TRACT | Status: DC
  Administered 2017-09-01 – 2017-09-04 (×4): 1 via RESPIRATORY_TRACT
  Filled 2017-09-01: qty 14

## 2017-09-01 MED ORDER — INFLUENZA VAC SPLIT HIGH-DOSE 0.5 ML IM SUSY
0.5000 mL | PREFILLED_SYRINGE | INTRAMUSCULAR | Status: DC
  Filled 2017-09-01: qty 0.5

## 2017-09-01 MED ORDER — DEXTROSE 5 % IV SOLN
1.0000 g | Freq: Three times a day (TID) | INTRAVENOUS | Status: DC
  Filled 2017-09-01 (×2): qty 1

## 2017-09-01 NOTE — H&P (Signed)
History and Physical    [name] SWN:[id number] DOB: 1951/02/25 DOA: 08/31/2017  PCP: Cathlean Sauer, MD   Patient coming from: Home   Chief Complaint: Found down, AMS, coffee-ground emesis   HPI: [name] is a 66 y.o. female with medical history significant for depression, anxiety, chronic low back pain on long-acting morphine, COPD, type 2 diabetes mellitus, coronary artery disease status post CABG, and status post gastric bypass surgery, now presenting to the emergency department with acute encephalopathy after being found down at home. She had not been seen by family for approximately 36 hours and would not respond to their calls. Eventually, her home was entered by force and she was found to be confused and make it, with coffee-ground emesis. She was brought into the ED for evaluation. Patient denies pain, dyspnea, or dysuria. Does not know what led to her being on the floor.   ED Course: Upon arrival to the ED, patient is found to be [name] 38.7 C, saturating adequately on room air, and with vitals otherwise stable. EKG demonstrates a sinus tachycardia with rate 115 and right bundle-branch block. Chest x-ray is negative for edema or consolidation. A noncontrast head CT is negative for acute intracranial abnormality. Cervical spine CT is negative for acute pathology. Chemistry panel reveals a potassium of 3.0 and creatinine 1.0 weight, up from 0.67 two weeks earlier. CBC is notable for a leukocytosis to 11,500 and a microcytic anemia with hemoglobin of 9.8 and MCV of 74.5. Urinalysis is suggestive of possible infection. Acetaminophen and salicylate levels were undetectable and serum CK is elevated to 2993. Patient was initially obtunded in the ED but woke up with a dose of Narcan. She was treated with 2 L normal saline, blood cultures were collected, 50 mEq of IV potassium was administered, and patient was also treated with 80 mg IV Protonix, started on Protonix infusion, and given  empiric Diflucan, vancomycin, Levaquin, and aztreonam.She remained hemodynamically stable in the ED and in no acute history distress and will be admitted to the telemetry unit for ongoing evaluation and management of acute encephalopathy with rhabdomyolysis and SIRS, possibly from UTI, as well as possible opiate overdose.  Review of Systems:  Unable to complete ROS secondary to patient's clinical condition with acute encephalopathy.   Past Medical History:  Diagnosis Date  . Allergy   . Anxiety   . Arthritis   . Asthma   . 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 Bilateral carotid artery occlusion 09/25/2013  . CAD s/p CABG 1994 09/25/2013   LIMA-LAD, SVG Diag, SVG-RCA, [facility] 2010: Stents in the proximal LAD with 40% in-stent restenosis and 50% post-stent stenosis, 80% small OM 1, 70% small OM 2, 40% first PLA; diffuse 70% distal RCA; patent SVG to diagonal, patent SVG to distal RCA, patent atretic LIMA EF 55%; normal nuclear stress test 2011  . Cataract   . CHF (congestive heart failure) ([place])   . Chronic anemia   . Depression   . Diabetes mellitus without complication ([place])   . Diastolic dysfunction   . GERD (gastroesophageal reflux disease)   . Glaucoma   . Heart murmur   . History of recurrent UTIs   . Hyperlipidemia   . Hypertension   . Hypocalcemia   . Ischemic heart disease   . Migraine   . Morbid obesity ([place])    s/p gastric bypass  . Osteoporosis   . Peroneal neuropathy   . Retinopathy   . Stroke [facility])  Past Surgical History:  Procedure Laterality Date  . APPENDECTOMY    . CARDIAC CATHETERIZATION  03/30/2009   3 vessel CAD,patent grafts  . CHOLECYSTECTOMY    . CORONARY ARTERY BYPASS GRAFT  1994   LIM to LAD,SVG to first diagonal,SVG to distal RCA  . GASTRIC BYPASS  2002  . HEEL SPUR EXCISION  2012  . HERNIA REPAIR    . NM MYOCAR PERF WALL MOTION  2011   no ischemia  . RADIOFREQUENCY ABLATION     AVNRT - successful  . SMALL INTESTINE  SURGERY    . TOTAL HIP ARTHROPLASTY Right 12/27/2014   Procedure: TOTAL HIP ARTHROPLASTY ANTERIOR APPROACH;  Surgeon: Alta Corning, MD;  Location: [facility];  Service: Orthopedics;  Laterality: Right;  . TRIGGER FINGER RELEASE  2012  . US ECHOCARDIOGRAPHY  05/09/2010   mild MR,mild mitral and aortic sclerosis  . VAGINAL HYSTERECTOMY    . VENTRAL HERNIA REPAIR       reports that she quit smoking about 29 years ago. She smoked 2.00 packs per day. She has never used smokeless tobacco. She reports that she does not drink alcohol or use drugs.  Allergies  Allergen Reactions  . Latex   . Metoclopramide Hcl   . Neurontin [Gabapentin]   . Penicillins     Family History  Problem Relation Age of Onset  . Diabetes Brother   . Asthma Brother   . Depression Brother   . Asthma Brother   . Depression Brother   . Diabetes Brother   . Cancer Father        skin  . Alcohol abuse Father   . Arthritis Father   . Depression Father   . Hypertension Father   . Alzheimer's disease Father   . Diabetes Mother   . Heart failure Mother   . Arthritis Mother   . Heart disease Mother   . Asthma Sister   . Heart attack Sister   . Depression Sister   . Diabetes Sister   . Drug abuse Sister   . Heart disease Sister   . Mental illness Sister   . Heart attack Son   . Hypertension Son   . Kidney disease Son        transplant 2003  . Vision loss Son      Prior to Admission medications   Medication Sig Start Date End Date Taking? Authorizing Provider  acetaminophen (TYLENOL) 500 MG tablet Take 1,500 mg by mouth every 6 (six) hours as needed for mild pain.    Yes [provider]  albuterol (PROVENTIL HFA;VENTOLIN HFA) 108 (90 Base) MCG/ACT inhaler Inhale 2 puffs into the lungs every 6 (six) hours as needed for wheezing or shortness of breath. 05/09/17  Yes Croitoru, Mihai, MD  aspirin 81 MG tablet Take 81 mg by mouth daily.   Yes [provider]  CALCIUM CITRATE PO Take 1,500 mg by  mouth 3 (three) times daily.   Yes [provider]  Cyanocobalamin (VITAMIN B 12 PO) Take 2,500 mg by mouth daily.   Yes [provider]  diclofenac sodium (VOLTAREN) 1 % GEL Apply 2 g topically 4 (four) times daily as needed.   Yes [provider]  DULoxetine (CYMBALTA) 60 MG capsule Take 60 mg by mouth daily.   Yes [provider]  GLIPIZIDE XL 10 MG 24 hr tablet TAKE ONE TABLET BY MOUTH ONCE DAILY WITH  BREAKFAST 04/02/17  Yes Dixon, Mary B, PA-C  IRON, FERROUS SULFATE,  PO Take 2 tablets by mouth at bedtime.   Yes [provider]  metFORMIN (GLUCOPHAGE) 500 MG tablet TAKE ONE TABLET BY MOUTH TWICE DAILY WITH MEALS 05/23/17  Yes Dena Billet B, PA-C  morphine (MS CONTIN) 30 MG 12 hr tablet Take 30 mg by mouth daily as needed. 11/28/16  Yes [provider]  nitroGLYCERIN (NITROLINGUAL) 0.4 MG/SPRAY spray USE ONE TO TWO SPRAYS UNDER THE TONGUE EVERY 5 MINUTES AS NEEDED FOR CHEST PAIN 09/06/14  Yes Croitoru, Mihai, MD  pantoprazole (PROTONIX) 40 MG tablet Take 40 mg by mouth daily.   Yes [provider]  umeclidinium-vilanterol (ANORO ELLIPTA) 62.5-25 MCG/INH AEPB Inhale 1 puff into the lungs daily. 08/09/17  Yes Baird Lyons D, MD  vitamin A 10000 UNIT capsule Take 10,000 Units by mouth daily.   Yes [provider]  Vitamin D, Ergocalciferol, (DRISDOL) 50000 UNITS CAPS capsule Take 1 capsule (50,000 Units total) by mouth every 7 (seven) days. 10/27/15  Yes Orlena Sheldon, PA-C  ALPRAZolam Duanne Moron) 0.5 MG tablet Take 1 tablet (0.5 mg total) by mouth at bedtime as needed for anxiety. Patient not taking: Reported on 09/01/2017 05/21/17   Dohmeier, Asencion Partridge, MD  blood glucose meter kit and supplies KIT Dispense based on patient and insurance preference.  Check blood sugar twice daily as directed. 12/24/16   Dena Billet B, PA-C  glucose blood test strip 1 each by Other route 2 (two) times daily. Dispense based on Insurance and patient preference.   Check blood sugars twice daily as instructed. 12/24/16   Orlena Sheldon, PA-C    Physical Exam: Vitals:   09/01/17 0009 09/01/17 0028 09/01/17 0106 09/01/17 0119  BP:  123/75 (!) 136/54 (!) 136/54  Pulse:  90 88 87  Resp:  _0 Temp:      TempSrc:      SpO2:  100% 100% 100%  Weight: 68 kg (150 lb)     Height: _1  (1.6 m)         Constitutional: NAD, calm, Appears chronically-ill and older than stated age.  Eyes: PERTLA, lids and conjunctivae normal ENMT: Mucous membranes are moist. Posterior pharynx clear of any exudate or lesions.   Neck: normal, supple, no masses, no thyromegaly Respiratory: Mildly diminished bilaterally. No rhonchi or rales. Normal respiratory effort.   Cardiovascular: Rate ~110 and regular. 1+ pedal edema bilaterally. No significant JVD. Abdomen: No distension, no tenderness, no masses palpated. Bowel sounds active.  Musculoskeletal: no clubbing / cyanosis. No joint deformity upper and lower extremities.    Skin: no significant rashes, lesions, ulcers. Poor turgor, scattered echymosses. Neurologic: CN 2-12 grossly intact. Sensation intact. Strength 5/5 in all 4 limbs.  Psychiatric: Alert and oriented to person and place, but not oriented to situation, month, or year. Intermittent confusion.     Labs on Admission: I have personally reviewed following labs and imaging studies  CBC:  Recent Labs Lab 08/31/17 2342  WBC 11.5*  NEUTROABS 9.9*  HGB 9.8*  HCT 31.2*  MCV 74.5*  PLT 007   Basic Metabolic Panel:  Recent Labs Lab 08/31/17 2342  NA 136  K 3.0*  CL 104  CO2 24  GLUCOSE 288*  BUN 16  CREATININE 1.08*  CALCIUM 8.0*  MG 1.7   GFR: Estimated Creatinine Clearance: 46.8 mL/min (A) (by C-G formula based on SCr of 1.08 mg/dL (H)). Liver Function Tests:  Recent Labs Lab 08/31/17 2342  AST 64*  ALT 34  ALKPHOS 143*  BILITOT  0.7  PROT 7.0  ALBUMIN 3.7   No results for input(s): LIPASE, AMYLASE in the last 168 hours. No  results for input(s): AMMONIA in the last 168 hours. Coagulation Profile: No results for input(s): INR, PROTIME in the last 168 hours. Cardiac Enzymes:  Recent Labs Lab 08/31/17 2342  CKTOTAL 2,993*   BNP (last 3 results) No results for input(s): PROBNP in the last 8760 hours. HbA1C: No results for input(s): HGBA1C in the last 72 hours. CBG:  Recent Labs Lab 08/31/17 2328  GLUCAP 289*   Lipid Profile: No results for input(s): CHOL, HDL, LDLCALC, TRIG, CHOLHDL, LDLDIRECT in the last 72 hours. Thyroid Function Tests: No results for input(s): TSH, T4TOTAL, FREET4, T3FREE, THYROIDAB in the last 72 hours. Anemia Panel: No results for input(s): VITAMINB12, FOLATE, FERRITIN, TIBC, IRON, RETICCTPCT in the last 72 hours. Urine analysis:    Component Value Date/Time   COLORURINE AMBER (A) 09/01/2017 0004   APPEARANCEUR CLOUDY (A) 09/01/2017 0004   APPEARANCEUR Cloudy 01/16/2015 1515   LABSPEC 1.024 09/01/2017 0004   LABSPEC 1.014 01/16/2015 1515   PHURINE 5.0 09/01/2017 0004   GLUCOSEU NEGATIVE 09/01/2017 0004   GLUCOSEU Negative 01/16/2015 1515   HGBUR LARGE (A) 09/01/2017 0004   BILIRUBINUR NEGATIVE 09/01/2017 0004   BILIRUBINUR Negative 01/16/2015 1515   KETONESUR NEGATIVE 09/01/2017 0004   PROTEINUR 100 (A) 09/01/2017 0004   UROBILINOGEN 0.2 03/30/2014 1415   NITRITE NEGATIVE 09/01/2017 0004   LEUKOCYTESUR TRACE (A) 09/01/2017 0004   LEUKOCYTESUR 2+ 01/16/2015 1515   Sepsis Labs: _0 (procalcitonin:4,lacticidven:4) )No results found for this or any previous visit (from the past 240 hour(s)).   Radiological Exams on Admission: Ct Head Wo Contrast  Result Date: 09/01/2017 CLINICAL DATA:  Confusion, disoriented, found naked vomiting. History of stroke, diabetes, hypertension. EXAM: CT HEAD WITHOUT CONTRAST CT CERVICAL SPINE WITHOUT CONTRAST TECHNIQUE: Multidetector CT imaging of the head and cervical spine was performed following the standard protocol without  intravenous contrast. Multiplanar CT image reconstructions of the cervical spine were also generated. COMPARISON:  MRI of the head Apr 25, 2017 FINDINGS: CT HEAD FINDINGS BRAIN: No intraparenchymal hemorrhage, mass effect nor midline shift. The ventricles and sulci are normal for age. Patchy supratentorial white matter hypodensities within normal range for patient's age, though non-specific are most compatible with chronic small vessel ischemic disease. Old small bifrontal infarcts. No acute large vascular territory infarcts. No abnormal extra-axial fluid collections. Basal cisterns are patent. VASCULAR: Moderate calcific atherosclerosis of the carotid siphons. SKULL: No skull fracture. No significant scalp soft tissue swelling. SINUSES/ORBITS: Small RIGHT maxillary mucosal retention cysts without paranasal sinus air-fluid levels. Mastoid air cells are well aerated.The included ocular globes and orbital contents are non-suspicious. Status post bilateral ocular lens implants. OTHER: None. CT CERVICAL SPINE FINDINGS ALIGNMENT: Maintained lordosis. Vertebral bodies in alignment. SKULL BASE AND VERTEBRAE: Cervical vertebral bodies and posterior elements are intact. Intervertebral disc heights preserved. No destructive bony lesions. Osteopenia. C1-2 articulation maintained. SOFT TISSUES AND SPINAL CANAL: Nonacute. Mild calcific atherosclerosis RIGHT carotid artery. Heterogeneous thyroid without dominant nodule. DISC LEVELS: No significant osseous canal stenosis or neural foraminal narrowing. UPPER CHEST: Lung apices are clear. OTHER: None. IMPRESSION: CT HEAD: 1. No acute intracranial process. 2. 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 CT CERVICAL SPINE: 1. No acute fracture or malalignment. 2. Osteopenia. Electronically Signed   By: Elon Alas M.D.   On: 09/01/2017 01:30   Ct Cervical Spine Wo Contrast  Result Date: 09/01/2017 CLINICAL DATA:  Confusion,  disoriented, found  naked vomiting. History of stroke, diabetes, hypertension. EXAM: CT HEAD WITHOUT CONTRAST CT CERVICAL SPINE WITHOUT CONTRAST TECHNIQUE: Multidetector CT imaging of the head and cervical spine was performed following the standard protocol without intravenous contrast. Multiplanar CT image reconstructions of the cervical spine were also generated. COMPARISON:  MRI of the head Apr 25, 2017 FINDINGS: CT HEAD FINDINGS BRAIN: No intraparenchymal hemorrhage, mass effect nor midline shift. The ventricles and sulci are normal for age. Patchy supratentorial white matter hypodensities within normal range for patient's age, though non-specific are most compatible with chronic small vessel ischemic disease. Old small bifrontal infarcts. No acute large vascular territory infarcts. No abnormal extra-axial fluid collections. Basal cisterns are patent. VASCULAR: Moderate calcific atherosclerosis of the carotid siphons. SKULL: No skull fracture. No significant scalp soft tissue swelling. SINUSES/ORBITS: Small RIGHT maxillary mucosal retention cysts without paranasal sinus air-fluid levels. Mastoid air cells are well aerated.The included ocular globes and orbital contents are non-suspicious. Status post bilateral ocular lens implants. OTHER: None. CT CERVICAL SPINE FINDINGS ALIGNMENT: Maintained lordosis. Vertebral bodies in alignment. SKULL BASE AND VERTEBRAE: Cervical vertebral bodies and posterior elements are intact. Intervertebral disc heights preserved. No destructive bony lesions. Osteopenia. C1-2 articulation maintained. SOFT TISSUES AND SPINAL CANAL: Nonacute. Mild calcific atherosclerosis RIGHT carotid artery. Heterogeneous thyroid without dominant nodule. DISC LEVELS: No significant osseous canal stenosis or neural foraminal narrowing. UPPER CHEST: Lung apices are clear. OTHER: None. IMPRESSION: CT HEAD: 1. No acute intracranial process. 2. 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 CT  CERVICAL SPINE: 1. No acute fracture or malalignment. 2. Osteopenia. Electronically Signed   By: Elon Alas M.D.   On: 09/01/2017 01:30   Dg Chest Port 1 View  Result Date: 09/01/2017 CLINICAL DATA:  Confusion.  Hypertension. EXAM: PORTABLE CHEST 1 VIEW COMPARISON:  December 16, 2014 FINDINGS: There is no edema or consolidation. The heart size is upper normal with pulmonary vascularity within normal limits. Patient is status post coronary artery bypass grafting. No adenopathy. Bones appear osteoporotic. There is aortic atherosclerosis. IMPRESSION: No edema or consolidation. Stable cardiac silhouette. There is aortic atherosclerosis. Patient is status post coronary artery bypass grafting. Bones osteoporotic. Aortic Atherosclerosis (ICD10-I70.0). Electronically Signed   By: Lowella Grip III M.D.   On: 09/01/2017 00:33    EKG: Independently reviewed. Sinus tachycardia (rate 115), RBBB.   Assessment/Plan  1. Acute encephalopathy  - Pt was last seen by family ~36 hrs prior to EMS forcing entry to her home and finding her obtunded with reported coffee-ground emesis  - Head CT is negative for acute intracranial abnormality and no focal neurologic deficits identified on exam  - Patient apparently became alert in ED after Narcan was administered and is answering questions appropriately, but with no recollection of how or when she ended up on the floor - Suspected secondary to opiate overdose versus infection  - Plan to reduce her long-acting morphine by 50%, continue supportive care     2. SIRS, ?UTI  - Pt presents with fever and leukocytosis; lactic acid reassuringly normal  - CXR grossly clear; UA could reflect infection, but no complaints of dysuria or flank pain; abd exam benign, no meningismus, and no wounds or cellulitis identified  - Blood cultures were collected in ED, will send urine for culture as well  - She was treated with 30 cc/kg NS and empiric vancomycin, Levaquin, and  Azactam in setting of penicillin-allergy  - Plan to continue empiric abx while following cultures and clinical  course; will check/trend pro-calcitonin   3. Coffee-ground emesis, microcytic anemia  - Pt was reportedly having coffee-ground emesis with EMS  - Hgb is only slightly lower than prior  - She has hx of gastric bypass and is managed with Protonix at home  - She was given 80 mg IV Protonix in ED and started on Protonix infusion  - Plan to continue IV PPI for now while trending H&H    4. Hypokalemia  - Serum potassium is 3.0 on admission, likely from vomiting  - She was treated by EDP with 50 mEq of IV potassium   - Continue cardiac monitoring and repeat chem panel in am    5. Rhabdomyolysis  - Serum CK is 2993 on admission - She was treated in ED with 2 L NS and will be continued on NS infusion  - Follow daily chem panel, continue IVF    6. Acute kidney injury  - SCr is 1.08 on admission, up from 0.67 three weeks earlier  - Likely a prerenal azotemia in setting of vomiting and time down on ground; less likely from rhabdomyolysis  - Continue IVF hydration, avoid nephrotoxins, repeat chem panel in am   7. COPD - No cough, wheeze, or dyspnea on arrival  - Continue Anoro Ellipta, prn albuterol    8. Chronic pain  - She is managed at home with long-acting morphine and Cymbalta  - She presents obtunded and woke up with Narcan  - Plan to continue Cymbalta, reduce morphine dose and resume in am as appropriate   9. CAD  - No anginal complaints  - Troponin at borderline of elevation  - Plan to continue cardiac monitoring, trend troponin, hold ASA in light of reported coffee-ground emesis     DVT prophylaxis: SCD's  Code Status: Full  Family Communication: Discussed with patient Disposition Plan: Admit to telemetry Consults called: None Admission status: Inpatient    Vianne Bulls, MD [facility] Pager [phone]  If 7PM-7AM, please contact  night-coverage [email or web address] Password [email or web address]  09/01/2017, 1:49 AM

## 2017-09-01 NOTE — ED Notes (Signed)
Please call Al for report at 0230. Phone # [phone]

## 2017-09-01 NOTE — Progress Notes (Signed)
PT Cancellation Note  Patient Details Name: [name] MRN: [id number] DOB: 01-May-1951   Cancelled Treatment:    Reason Eval/Treat Not Completed: Medical issues which prohibited therapy. Patient calling out, involved in dry heaves, took an Emesis receptical for patient, patient agitated, wanted the trash can, not emesis receptical. Will defer Evaluation until 9/17.   [facility] PT [phone]  [name] 09/01/2017, 12:14 PM

## 2017-09-01 NOTE — Plan of Care (Signed)
Problem: Education: Goal: Knowledge of [facility] Education information/materials will improve Outcome: Progressing .  Problem: Pain Managment: Goal: General experience of comfort will improve Outcome: Progressing .  Problem: Physical Regulation: Goal: Ability to maintain clinical measurements within normal limits will improve Outcome: Progressing . Goal: Will remain free from infection Outcome: Progressing .  Problem: Skin Integrity: Goal: Risk for impaired skin integrity will decrease Outcome: Progressing .  Problem: Fluid Volume: Goal: Ability to maintain a balanced intake and output will improve Outcome: Progressing .

## 2017-09-01 NOTE — Progress Notes (Signed)
  PROGRESS NOTE  Patient admitted earlier this morning. See H&P. [name] is a 66 yo female with medical history significant for depression, anxiety, pseudodementia with memory deficits, chronic low back pain on long-acting morphine, COPD, type 2 diabetes mellitus, coronary artery disease status post CABG, and status post gastric bypass surgery. She presented to the emergency department with acute encephalopathy after being found down at home. She had not been She had not been seen by family for approximately 36 hours and would not respond to their calls. Eventually, her home was entered by force and she was found to be confused, naked, with coffee-ground emesis. Patient was initially obtunded in the ED but woke up with a dose of Narcan. Noncontrast head CT is negative for acute intracranial abnormality. Cervical spine CT is negative for acute pathology. She was admitted for acute encephalopathy, likely due to opioid-induced overdose, possibly UTI, as well as rhabdomyolysis.   This morning on my examination, she is alert and awake. She complains of shortness of breath that just started prior to my arrival. She also complains of left sided chest pressure. States she has never had this before and has never had heart issues before (actually has hx CAD with CABG). She states it is "bad" but unable to further characterize and rate the pain. She is quite agitated and does not participate well to our conversation. She tells me that she does not take morphine every day, but takes it intermittently, maybe every other day. When asked why she takes this, she states that it is because of her chest pain. She does not recall history prior to admission, states that she fell but does not know why. She asks me if I read her chart and information should be there.   Chest pain was reproducible with palpation, lung sounds clear without rhonchi or wheeze  EKG obtained which was reviewed independently, normal sinus rhythm  without ST change, no significant change from previous EKG  Troponin ordered and pending  Empiric vanco, levaquin, azactam for possible UTI, await urine and blood culture and deescalate as able  Continue to trend CBC, watch for further episode of emesis due to report of coffee ground emesis by EMS PPI  IVF for rhabdo and AKI  Reduce morphine dose, continue cymbalta  PT OT    Dessa Phi, DO [facility] [email or web address] Password [facility] 09/01/2017, 10:34 AM

## 2017-09-01 NOTE — ED Provider Notes (Signed)
[facility] DEPT Provider Note   CSN: [id number] Arrival date & time: 08/31/17  2248     History   Chief Complaint Chief Complaint  Patient presents with  . Altered Mental Status  . Fall    HPI [name] is a 66 y.o. female.  The history is provided by a relative. The history is limited by the condition of the patient.  Altered Mental Status   This is a new problem. The current episode started yesterday. The problem has not changed since onset.Associated symptoms include somnolence. Associated symptoms comments: Fall, found down. Risk factors: morphine for chronic back pain. Her past medical history does not include seizures.  Fall  This is a new problem. The current episode started yesterday. The problem occurs constantly. The problem has not changed since onset.Nothing aggravates the symptoms. Nothing relieves the symptoms. She has tried nothing for the symptoms. The treatment provided no relief.    Past Medical History:  Diagnosis Date  . Allergy   . Anxiety   . Arthritis   . Asthma   . 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 Bilateral carotid artery occlusion 09/25/2013  . CAD s/p CABG 1994 09/25/2013   LIMA-LAD, SVG Diag, SVG-RCA, [facility] Cath 2010: Stents in the proximal LAD with 40% in-stent restenosis and 50% post-stent stenosis, 80% small OM 1, 70% small OM 2, 40% first PLA; diffuse 70% distal RCA; patent SVG to diagonal, patent SVG to distal RCA, patent atretic LIMA EF 55%; normal nuclear stress test 2011  . Cataract   . CHF (congestive heart failure) ([place])   . Chronic anemia   . Depression   . Diabetes mellitus without complication ([place])   . Diastolic dysfunction   . GERD (gastroesophageal reflux disease)   . Glaucoma   . Heart murmur   . History of recurrent UTIs   . Hyperlipidemia   . Hypertension   . Hypocalcemia   . Ischemic heart disease   . Migraine   . Morbid obesity ([place])    s/p gastric bypass  . Osteoporosis   . Peroneal  neuropathy   . Retinopathy   . Stroke [facility])     Patient Active Problem List   Diagnosis Date Noted  . 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 COPD mixed type ([place]) 08/09/2017  . 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 Diabetes mellitus type 2, uncontrolled, without complications ([place]) 08/65/7846  . Amaurosis fugax of right eye 10/26/2015  . Depression 02/28/2015  . Bilateral leg edema, asymmetrical (R>L) 02/20/2015  . SVT (supraventricular tachycardia) ([place]) 12/31/2014  . Diabetes ([place]) 12/30/2014  . Primary osteoarthritis of right hip 12/27/2014  . Angina decubitus ([place]) 11/08/2014  . Hip joint pain 11/08/2014  . Chronic diastolic heart failure ([place]) 11/08/2014  . Acute on chronic diastolic heart failure ([place]) 09/23/2014  . Protein-calorie malnutrition, severe ([place]) 03/31/2014  . Hypocalcemia 03/30/2014  . Diarrhea 03/30/2014  . Bronchitis, acute 03/30/2014  . CAD s/p CABG 1994 09/25/2013  . Bilateral carotid artery occlusion 09/25/2013  . 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 H/O gastric bypass 09/25/2013  . History of Diastolic heart failure 96/29/5284  . Iron deficiency anemia 05/28/2012    Past Surgical History:  Procedure Laterality Date  . APPENDECTOMY    . CARDIAC CATHETERIZATION  03/30/2009   3 vessel CAD,patent grafts  . CHOLECYSTECTOMY    . CORONARY ARTERY BYPASS GRAFT  1994   LIM to LAD,SVG to first diagonal,SVG to distal RCA  .  GASTRIC BYPASS  2002  . HEEL SPUR EXCISION  2012  . HERNIA REPAIR    . NM MYOCAR PERF WALL MOTION  2011   no ischemia  . RADIOFREQUENCY ABLATION     AVNRT - successful  . SMALL INTESTINE SURGERY    . TOTAL HIP ARTHROPLASTY Right 12/27/2014   Procedure: TOTAL HIP ARTHROPLASTY ANTERIOR APPROACH;  Surgeon: Alta Corning, MD;  Location: [facility];  Service: Orthopedics;  Laterality: Right;  . TRIGGER FINGER RELEASE  2012  . US ECHOCARDIOGRAPHY  05/09/2010   mild MR,mild mitral and aortic sclerosis  .  VAGINAL HYSTERECTOMY    . VENTRAL HERNIA REPAIR      OB History    No data available       Home Medications    Prior to Admission medications   Medication Sig Start Date End Date Taking? Authorizing Provider  acetaminophen (TYLENOL) 500 MG tablet Take 1,500 mg by mouth every 6 (six) hours as needed for mild pain.    Yes [provider]  albuterol (PROVENTIL HFA;VENTOLIN HFA) 108 (90 Base) MCG/ACT inhaler Inhale 2 puffs into the lungs every 6 (six) hours as needed for wheezing or shortness of breath. 05/09/17  Yes Croitoru, Mihai, MD  aspirin 81 MG tablet Take 81 mg by mouth daily.   Yes [provider]  CALCIUM CITRATE PO Take 1,500 mg by mouth 3 (three) times daily.   Yes [provider]  Cyanocobalamin (VITAMIN B 12 PO) Take 2,500 mg by mouth daily.   Yes [provider]  diclofenac sodium (VOLTAREN) 1 % GEL Apply 2 g topically 4 (four) times daily as needed.   Yes [provider]  DULoxetine (CYMBALTA) 60 MG capsule Take 60 mg by mouth daily.   Yes [provider]  GLIPIZIDE XL 10 MG 24 hr tablet TAKE ONE TABLET BY MOUTH ONCE DAILY WITH  BREAKFAST 04/02/17  Yes Dixon, Mary B, PA-C  IRON, FERROUS SULFATE, PO Take 2 tablets by mouth at bedtime.   Yes [provider]  metFORMIN (GLUCOPHAGE) 500 MG tablet TAKE ONE TABLET BY MOUTH TWICE DAILY WITH MEALS 05/23/17  Yes Dena Billet B, PA-C  morphine (MS CONTIN) 30 MG 12 hr tablet Take 30 mg by mouth daily as needed. 11/28/16  Yes [provider]  nitroGLYCERIN (NITROLINGUAL) 0.4 MG/SPRAY spray USE ONE TO TWO SPRAYS UNDER THE TONGUE EVERY 5 MINUTES AS NEEDED FOR CHEST PAIN 09/06/14  Yes Croitoru, Mihai, MD  pantoprazole (PROTONIX) 40 MG tablet Take 40 mg by mouth daily.   Yes [provider]  umeclidinium-vilanterol (ANORO ELLIPTA) 62.5-25 MCG/INH AEPB Inhale 1 puff into the lungs daily. 08/09/17  Yes Baird Lyons D, MD  vitamin A 10000 UNIT capsule Take 10,000 Units  by mouth daily.   Yes [provider]  Vitamin D, Ergocalciferol, (DRISDOL) 50000 UNITS CAPS capsule Take 1 capsule (50,000 Units total) by mouth every 7 (seven) days. 10/27/15  Yes Orlena Sheldon, PA-C  ALPRAZolam Duanne Moron) 0.5 MG tablet Take 1 tablet (0.5 mg total) by mouth at bedtime as needed for anxiety. Patient not taking: Reported on 09/01/2017 05/21/17   Dohmeier, Asencion Partridge, MD  blood glucose meter kit and supplies KIT Dispense based on patient and insurance preference.  Check blood sugar twice daily as directed. 12/24/16   Dena Billet B, PA-C  glucose blood test strip 1 each by Other route 2 (two) times daily. Dispense based on Insurance and patient preference.  Check blood sugars twice daily as instructed.  12/24/16   Orlena Sheldon, PA-C    Family History Family History  Problem Relation Age of Onset  . Diabetes Brother   . Asthma Brother   . Depression Brother   . Asthma Brother   . Depression Brother   . Diabetes Brother   . Cancer Father        skin  . Alcohol abuse Father   . Arthritis Father   . Depression Father   . Hypertension Father   . Alzheimer's disease Father   . Diabetes Mother   . Heart failure Mother   . Arthritis Mother   . Heart disease Mother   . Asthma Sister   . Heart attack Sister   . Depression Sister   . Diabetes Sister   . Drug abuse Sister   . Heart disease Sister   . Mental illness Sister   . Heart attack Son   . Hypertension Son   . Kidney disease Son        transplant 2003  . Vision loss Son     Social History Social History  Substance Use Topics  . Smoking status: Former Smoker    Packs/day: 2.00    Quit date: 12/18/1987  . Smokeless tobacco: Never Used     Comment: Pt unsure how many years she smoked total.  . Alcohol use No     Allergies   Latex; Metoclopramide hcl; Neurontin [gabapentin]; and Penicillins   Review of Systems Review of Systems  Unable to perform ROS: Mental status change     Physical Exam Updated Vital  Signs BP (!) 136/54   Pulse 87   Temp 98.5 F (36.9 C) (Oral)   Resp 11   Ht '5\' 3"'$  (1.6 m)   Wt 68 kg (150 lb)   SpO2 100%   BMI 26.57 kg/m   Physical Exam  Constitutional: She appears well-developed and well-nourished.  HENT:  Head: Normocephalic. Head is without raccoon's eyes and without Battle's sign.  Right Ear: External ear normal. No hemotympanum.  Left Ear: External ear normal. No hemotympanum.  Mouth/Throat: Oropharynx is clear and moist.  Eyes: Conjunctivae are normal.  Neck: Normal range of motion. Neck supple. No JVD present.  Cardiovascular: Normal rate, regular rhythm, normal heart sounds and intact distal pulses.   Pulmonary/Chest: No stridor. She has no wheezes. She has no rales.  Ecchymosis left breast r upper lateral arm  Abdominal: Soft. Bowel sounds are normal. There is no tenderness. There is no guarding.  Ecchymosis RMQ  Musculoskeletal: Normal range of motion. She exhibits no edema, tenderness or deformity.  Neurological: She displays normal reflexes. GCS eye subscore is 4. GCS verbal subscore is 1. GCS motor subscore is 4.  GCS 9 prior to narcan, after narcan 14  Skin: Skin is warm and dry. Capillary refill takes less than 2 seconds. She is not diaphoretic.  Psychiatric:  unable  Nursing note and vitals reviewed.    ED Treatments / Results   Vitals:   09/01/17 0106 09/01/17 0119  BP: (!) 136/54 (!) 136/54  Pulse: 88 87  Resp: 10 11  Temp:    SpO2: 100% 100%    Labs (all labs ordered are listed, but only abnormal results are displayed)  Results for orders placed or performed during the hospital encounter of 08/31/17  Comprehensive metabolic panel  Result Value Ref Range   Sodium 136 135 - 145 mmol/L   Potassium 3.0 (L) 3.5 - 5.1 mmol/L   Chloride 104 101 -  111 mmol/L   CO2 24 22 - 32 mmol/L   Glucose, Bld 288 (H) 65 - 99 mg/dL   BUN 16 6 - 20 mg/dL   Creatinine, Ser 1.08 (H) 0.44 - 1.00 mg/dL   Calcium 8.0 (L) 8.9 - 10.3 mg/dL    Total Protein 7.0 6.5 - 8.1 g/dL   Albumin 3.7 3.5 - 5.0 g/dL   AST 64 (H) 15 - 41 U/L   ALT 34 14 - 54 U/L   Alkaline Phosphatase 143 (H) 38 - 126 U/L   Total Bilirubin 0.7 0.3 - 1.2 mg/dL   GFR calc non Af Amer 52 (L) >60 mL/min   GFR calc Af Amer >60 >60 mL/min   Anion gap 8 5 - 15  CBC WITH DIFFERENTIAL  Result Value Ref Range   WBC 11.5 (H) 4.0 - 10.5 K/uL   RBC 4.19 3.87 - 5.11 MIL/uL   Hemoglobin 9.8 (L) 12.0 - 15.0 g/dL   HCT 31.2 (L) 36.0 - 46.0 %   MCV 74.5 (L) 78.0 - 100.0 fL   MCH 23.4 (L) 26.0 - 34.0 pg   MCHC 31.4 30.0 - 36.0 g/dL   RDW 16.8 (H) 11.5 - 15.5 %   Platelets 176 150 - 400 K/uL   Neutrophils Relative % 86 %   Neutro Abs 9.9 (H) 1.7 - 7.7 K/uL   Lymphocytes Relative 8 %   Lymphs Abs 0.9 0.7 - 4.0 K/uL   Monocytes Relative 6 %   Monocytes Absolute 0.7 0.1 - 1.0 K/uL   Eosinophils Relative 0 %   Eosinophils Absolute 0.0 0.0 - 0.7 K/uL   Basophils Relative 0 %   Basophils Absolute 0.0 0.0 - 0.1 K/uL  Urinalysis, Routine w reflex microscopic  Result Value Ref Range   Color, Urine AMBER (A) YELLOW   APPearance CLOUDY (A) CLEAR   Specific Gravity, Urine 1.024 1.005 - 1.030   pH 5.0 5.0 - 8.0   Glucose, UA NEGATIVE NEGATIVE mg/dL   Hgb urine dipstick LARGE (A) NEGATIVE   Bilirubin Urine NEGATIVE NEGATIVE   Ketones, ur NEGATIVE NEGATIVE mg/dL   Protein, ur 100 (A) NEGATIVE mg/dL   Nitrite NEGATIVE NEGATIVE   Leukocytes, UA TRACE (A) NEGATIVE   RBC / HPF 0-5 0 - 5 RBC/hpf   WBC, UA 6-30 0 - 5 WBC/hpf   Bacteria, UA MANY (A) NONE SEEN   Squamous Epithelial / LPF 0-5 (A) NONE SEEN   Mucus PRESENT    Budding Yeast PRESENT    Hyaline Casts, UA PRESENT   Acetaminophen level  Result Value Ref Range   Acetaminophen (Tylenol), Serum <10 (L) 10 - 30 ug/mL  Salicylate level  Result Value Ref Range   Salicylate Lvl <9.1 2.8 - 30.0 mg/dL  CK  Result Value Ref Range   Total CK 2,993 (H) 38 - 234 U/L  Magnesium  Result Value Ref Range   Magnesium 1.7 1.7  - 2.4 mg/dL  CBG monitoring, ED  Result Value Ref Range   Glucose-Capillary 289 (H) 65 - 99 mg/dL  I-Stat CG4 Lactic Acid, ED  (not at  [facility])  Result Value Ref Range   Lactic Acid, Venous 0.91 0.5 - 1.9 mmol/L  Type and screen [place]  Result Value Ref Range   ABO/RH(D) O POS    Antibody Screen NEG    Sample Expiration 09/04/2017    Ct Head Wo Contrast  Result Date: 09/01/2017 CLINICAL DATA:  Confusion, disoriented, found naked vomiting. History of  stroke, diabetes, hypertension. EXAM: CT HEAD WITHOUT CONTRAST CT CERVICAL SPINE WITHOUT CONTRAST TECHNIQUE: Multidetector CT imaging of the head and cervical spine was performed following the standard protocol without intravenous contrast. Multiplanar CT image reconstructions of the cervical spine were also generated. COMPARISON:  MRI of the head Apr 25, 2017 FINDINGS: CT HEAD FINDINGS BRAIN: No intraparenchymal hemorrhage, mass effect nor midline shift. The ventricles and sulci are normal for age. Patchy supratentorial white matter hypodensities within normal range for patient's age, though non-specific are most compatible with chronic small vessel ischemic disease. Old small bifrontal infarcts. No acute large vascular territory infarcts. No abnormal extra-axial fluid collections. Basal cisterns are patent. VASCULAR: Moderate calcific atherosclerosis of the carotid siphons. SKULL: No skull fracture. No significant scalp soft tissue swelling. SINUSES/ORBITS: Small RIGHT maxillary mucosal retention cysts without paranasal sinus air-fluid levels. Mastoid air cells are well aerated.The included ocular globes and orbital contents are non-suspicious. Status post bilateral ocular lens implants. OTHER: None. CT CERVICAL SPINE FINDINGS ALIGNMENT: Maintained lordosis. Vertebral bodies in alignment. SKULL BASE AND VERTEBRAE: Cervical vertebral bodies and posterior elements are intact. Intervertebral disc heights preserved. No destructive bony  lesions. Osteopenia. C1-2 articulation maintained. SOFT TISSUES AND SPINAL CANAL: Nonacute. Mild calcific atherosclerosis RIGHT carotid artery. Heterogeneous thyroid without dominant nodule. DISC LEVELS: No significant osseous canal stenosis or neural foraminal narrowing. UPPER CHEST: Lung apices are clear. OTHER: None. IMPRESSION: CT HEAD: 1. No acute intracranial process. 2. 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 CT CERVICAL SPINE: 1. No acute fracture or malalignment. 2. Osteopenia. Electronically Signed   By: Elon Alas M.D.   On: 09/01/2017 01:30   Ct Cervical Spine Wo Contrast  Result Date: 09/01/2017 CLINICAL DATA:  Confusion, disoriented, found naked vomiting. History of stroke, diabetes, hypertension. EXAM: CT HEAD WITHOUT CONTRAST CT CERVICAL SPINE WITHOUT CONTRAST TECHNIQUE: Multidetector CT imaging of the head and cervical spine was performed following the standard protocol without intravenous contrast. Multiplanar CT image reconstructions of the cervical spine were also generated. COMPARISON:  MRI of the head Apr 25, 2017 FINDINGS: CT HEAD FINDINGS BRAIN: No intraparenchymal hemorrhage, mass effect nor midline shift. The ventricles and sulci are normal for age. Patchy supratentorial white matter hypodensities within normal range for patient's age, though non-specific are most compatible with chronic small vessel ischemic disease. Old small bifrontal infarcts. No acute large vascular territory infarcts. No abnormal extra-axial fluid collections. Basal cisterns are patent. VASCULAR: Moderate calcific atherosclerosis of the carotid siphons. SKULL: No skull fracture. No significant scalp soft tissue swelling. SINUSES/ORBITS: Small RIGHT maxillary mucosal retention cysts without paranasal sinus air-fluid levels. Mastoid air cells are well aerated.The included ocular globes and orbital contents are non-suspicious. Status post bilateral ocular lens implants.  OTHER: None. CT CERVICAL SPINE FINDINGS ALIGNMENT: Maintained lordosis. Vertebral bodies in alignment. SKULL BASE AND VERTEBRAE: Cervical vertebral bodies and posterior elements are intact. Intervertebral disc heights preserved. No destructive bony lesions. Osteopenia. C1-2 articulation maintained. SOFT TISSUES AND SPINAL CANAL: Nonacute. Mild calcific atherosclerosis RIGHT carotid artery. Heterogeneous thyroid without dominant nodule. DISC LEVELS: No significant osseous canal stenosis or neural foraminal narrowing. UPPER CHEST: Lung apices are clear. OTHER: None. IMPRESSION: CT HEAD: 1. No acute intracranial process. 2. 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 CT CERVICAL SPINE: 1. No acute fracture or malalignment. 2. Osteopenia. Electronically Signed   By: Elon Alas M.D.   On: 09/01/2017 01:30   Dg Chest Port 1 View  Result Date: 09/01/2017 CLINICAL DATA:  Confusion.  Hypertension. EXAM: PORTABLE CHEST 1 VIEW COMPARISON:  December 16, 2014 FINDINGS: There is no edema or consolidation. The heart size is upper normal with pulmonary vascularity within normal limits. Patient is status post coronary artery bypass grafting. No adenopathy. Bones appear osteoporotic. There is aortic atherosclerosis. IMPRESSION: No edema or consolidation. Stable cardiac silhouette. There is aortic atherosclerosis. Patient is status post coronary artery bypass grafting. Bones osteoporotic. Aortic Atherosclerosis (ICD10-I70.0). Electronically Signed   By: Lowella Grip III M.D.   On: 09/01/2017 00:33    EKG  EKG Interpretation  Date/Time:  Saturday August 31 2017 23:43:56 EDT Ventricular Rate:  115 PR Interval:    QRS Duration: 170 QT Interval:  372 QTC Calculation: 515 R Axis:   56 Text Interpretation:  Sinus tachycardia Right bundle branch block Confirmed by Randal Buba, Zakyla Tonche ([id number]) on 08/31/2017 11:47:16 PM       Radiology Dg Chest Port 1 View  Result Date:  09/01/2017 CLINICAL DATA:  Confusion.  Hypertension. EXAM: PORTABLE CHEST 1 VIEW COMPARISON:  December 16, 2014 FINDINGS: There is no edema or consolidation. The heart size is upper normal with pulmonary vascularity within normal limits. Patient is status post coronary artery bypass grafting. No adenopathy. Bones appear osteoporotic. There is aortic atherosclerosis. IMPRESSION: No edema or consolidation. Stable cardiac silhouette. There is aortic atherosclerosis. Patient is status post coronary artery bypass grafting. Bones osteoporotic. Aortic Atherosclerosis (ICD10-I70.0). Electronically Signed   By: Lowella Grip III M.D.   On: 09/01/2017 00:33    Procedures Procedures (including critical care time)  Medications Ordered in ED Medications  vancomycin (VANCOCIN) IVPB 1000 mg/200 mL premix (not administered)  pantoprazole (PROTONIX) 80 mg in sodium chloride 0.9 % 250 mL (0.32 mg/mL) infusion (8 mg/hr Intravenous New Bag/Given 09/01/17 0043)  pantoprazole (PROTONIX) injection 40 mg (not administered)  potassium chloride 10 mEq in 100 mL IVPB (not administered)  fluconazole (DIFLUCAN) IVPB 200 mg (not administered)  naloxone (NARCAN) injection 1 mg (1 mg Intravenous Given 08/31/17 2327)  sodium chloride 0.9 % bolus 1,000 mL (0 mLs Intravenous Stopped 09/01/17 0035)    And  sodium chloride 0.9 % bolus 1,000 mL (0 mLs Intravenous Stopped 09/01/17 0118)  levofloxacin (LEVAQUIN) IVPB 750 mg (750 mg Intravenous New Bag/Given 08/31/17 2357)  aztreonam (AZACTAM) 2 g in dextrose 5 % 50 mL IVPB (0 g Intravenous Stopped 09/01/17 0124)  pantoprazole (PROTONIX) 80 mg in sodium chloride 0.9 % 100 mL IVPB (0 mg Intravenous Stopped 09/01/17 0044)  acetaminophen (TYLENOL) suppository 975 mg (975 mg Rectal Given 08/31/17 2350)   MDM Reviewed: previous chart, nursing note and vitals Reviewed previous: labs Interpretation: labs, ECG, x-ray and CT scan (hypokalemia 3.0, rhabdo 2993 CK, urine consistent with both  bacterial and fungal infection abx and anti fungals initiated, no PNA on CXR by me, no ICH on CT head by me) Total time providing critical care: > 105 minutes. This excludes time spent performing separately reportable procedures and services. Consults: admitting MD  Sepsis protocol initiated immediately by me Hematemesis on patient on arrival, PPI initiated immediately AMS: narcan given immediately by me with immediate response and improvement in mentation Hypokalemia, K riders IV started in the ED   Final Clinical Impressions(s) / ED Diagnoses   Final diagnoses:  Hypokalemia  Traumatic rhabdomyolysis, initial encounter ([place])  Acute cystitis without hematuria  Fall, initial encounter  Opioid-induced delirium ([place])  Sepsis, due to unspecified organism ([place])  Hematemesis, presence of nausea not specified   Admitted to medicine   New Prescriptions New  Prescriptions   No medications on file     Davian Wollenberg, MD 09/01/17 6016

## 2017-09-01 NOTE — Progress Notes (Signed)
Pharmacy Antibiotic Note  [name] is a 66 y.o. female admitted on 08/31/2017 with sepsis.  Pharmacy has been consulted for Levofloxacin, aztreonam, vancomycin dosing.  Plan: Vancomycin 750mg  IV every 12 hours.  Goal trough 15-20 mcg/mL.  Aztreonam 1gm iv q8hr Levofloxacin 750mg  iv q24hr  Height: 5\' 2"  (157.5 cm) Weight: 152 lb 5.4 oz (69.1 kg) IBW/kg (Calculated) : 50.1  Temp (24hrs), Avg:99.2 F (37.3 C), Min:98.2 F (36.8 C), Max:101.7 F (38.7 C)   Recent Labs Lab 08/31/17 2342 09/01/17 0035 09/01/17 0420  WBC 11.5*  --  9.8  CREATININE 1.08*  --  0.87  LATICACIDVEN  --  0.91  --     Estimated Creatinine Clearance: 57.2 mL/min (by C-G formula based on SCr of 0.87 mg/dL).    Allergies  Allergen Reactions  . Latex   . Metoclopramide Hcl   . Neurontin [Gabapentin]   . Penicillins     Antimicrobials this admission: Vancomycin 08/31/2017 >> Aztreonam 08/31/2017 >>  Levofloxacin 08/31/2017 >>  Dose adjustments this admission: -  Microbiology results: pending  Thank you for allowing pharmacy to be a part of this patient's care.  Nani Skillern [place] 09/01/2017 6:05 AM

## 2017-09-02 LAB — CK: Total CK: 2668 U/L — ABNORMAL HIGH (ref 38–234)

## 2017-09-02 LAB — CBC WITH DIFFERENTIAL/PLATELET
Basophils Absolute: 0 10*3/uL (ref 0.0–0.1)
Basophils Relative: 0 %
EOS ABS: 0 10*3/uL (ref 0.0–0.7)
EOS PCT: 0 %
HCT: 30.7 % — ABNORMAL LOW (ref 36.0–46.0)
Hemoglobin: 9.9 g/dL — ABNORMAL LOW (ref 12.0–15.0)
LYMPHS ABS: 1.1 10*3/uL (ref 0.7–4.0)
Lymphocytes Relative: 16 %
MCH: 23.2 pg — AB (ref 26.0–34.0)
MCHC: 32.2 g/dL (ref 30.0–36.0)
MCV: 72.1 fL — ABNORMAL LOW (ref 78.0–100.0)
MONOS PCT: 8 %
Monocytes Absolute: 0.5 10*3/uL (ref 0.1–1.0)
Neutro Abs: 5.3 10*3/uL (ref 1.7–7.7)
Neutrophils Relative %: 76 %
Platelets: 133 10*3/uL — ABNORMAL LOW (ref 150–400)
RBC: 4.26 MIL/uL (ref 3.87–5.11)
RDW: 16.7 % — ABNORMAL HIGH (ref 11.5–15.5)
WBC: 6.9 10*3/uL (ref 4.0–10.5)

## 2017-09-02 LAB — BASIC METABOLIC PANEL
ANION GAP: 12 (ref 5–15)
BUN: 8 mg/dL (ref 6–20)
CHLORIDE: 104 mmol/L (ref 101–111)
CO2: 21 mmol/L — ABNORMAL LOW (ref 22–32)
Calcium: 8.1 mg/dL — ABNORMAL LOW (ref 8.9–10.3)
Creatinine, Ser: 0.5 mg/dL (ref 0.44–1.00)
GFR calc Af Amer: >60 mL/min (ref >60)
GLUCOSE: 127 mg/dL — AB (ref 65–99)
POTASSIUM: 3.1 mmol/L — AB (ref 3.5–5.1)
Sodium: 137 mmol/L (ref 135–145)

## 2017-09-02 LAB — GLUCOSE, CAPILLARY
GLUCOSE-CAPILLARY: 187 mg/dL — AB (ref 65–99)
GLUCOSE-CAPILLARY: 143 mg/dL — AB (ref 65–99)
GLUCOSE-CAPILLARY: 137 mg/dL — AB (ref 65–99)
Glucose-Capillary: 130 mg/dL — ABNORMAL HIGH (ref 65–99)

## 2017-09-02 MED ORDER — SODIUM CHLORIDE 0.9 % IV SOLN
510.0000 mg | Freq: Once | INTRAVENOUS | Status: AC
  Administered 2017-09-02: 510 mg via INTRAVENOUS
  Filled 2017-09-02: qty 17

## 2017-09-02 MED ORDER — PANTOPRAZOLE SODIUM 40 MG PO TBEC
40.0000 mg | DELAYED_RELEASE_TABLET | Freq: Two times a day (BID) | ORAL | Status: DC
  Administered 2017-09-02 – 2017-09-04 (×5): 40 mg via ORAL
  Filled 2017-09-02 (×5): qty 1

## 2017-09-02 MED ORDER — DEXTROSE 5 % IV SOLN
1.0000 g | Freq: Two times a day (BID) | INTRAVENOUS | Status: DC
  Administered 2017-09-02 – 2017-09-03 (×4): 1 g via INTRAVENOUS
  Filled 2017-09-02 (×5): qty 1

## 2017-09-02 MED ORDER — POTASSIUM CHLORIDE CRYS ER 20 MEQ PO TBCR
40.0000 meq | EXTENDED_RELEASE_TABLET | ORAL | Status: AC
  Administered 2017-09-02 (×2): 40 meq via ORAL
  Filled 2017-09-02 (×2): qty 2
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
## 2017-09-02 NOTE — Progress Notes (Signed)
PT Cancellation Note  Patient Details Name: [name] MRN: [id number] DOB: 07-Feb-1951   Cancelled Treatment:    Reason Eval/Treat Not Completed: Patient at procedure or test/unavailable. Patient is receiving IV iron and also said that she was too sleepy. Will check back later.   [name] 09/02/2017, 10:16 AM Tresa Endo PT [phone]

## 2017-09-02 NOTE — Evaluation (Signed)
Physical Therapy Evaluation Patient Details Name: [name] MRN: [id number] DOB: 1951-10-16 Today's Date: 09/02/2017   History of Present Illness  [name] is a 66 yo female with medical history significant for depression, anxiety, pseudodementia with memory deficits, chronic low back pain , COPD, type 2 diabetes mellitus, coronary artery disease status post CABG, and status post gastric bypass surgery. She presented to the emergency department 08/31/17 with acute encephalopathy after being found down at home  Clinical Impression  The patient participated in ambulation today. Declined use of RW. She adamantly declines SNF. Recommend intermittent supervision. Pt admitted with above diagnosis. Pt currently with functional limitations due to the deficits listed below (see PT Problem List).  Pt will benefit from skilled PT to increase their independence and safety with mobility to allow discharge to the venue listed below.       Follow Up Recommendations Home health PT;Supervision/Assistance - 24 hour    Equipment Recommendations  None recommended by PT    Recommendations for Other Services       Precautions / Restrictions Precautions Precautions: Fall      Mobility  Bed Mobility               General bed mobility comments: in recliner  Transfers Overall transfer level: Needs assistance   Transfers: Sit to/from Stand Sit to Stand: Min guard         General transfer comment: from recliner, declined RW  Ambulation/Gait Ambulation/Gait assistance: Min assist;Min guard Ambulation Distance (Feet): 90 Feet Assistive device: 1 person hand held assist;None Gait Pattern/deviations: Step-to pattern;Drifts right/left     General Gait Details: gait is unsteady, patient declined to use the RW.    Stairs            Wheelchair Mobility    Modified Rankin (Stroke Patients Only)       Balance Overall balance assessment: Needs assistance;History of  Falls Sitting-balance support: No upper extremity supported;Feet supported Sitting balance-Leahy Scale: Good     Standing balance support: During functional activity;No upper extremity supported Standing balance-Leahy Scale: Poor                               Pertinent Vitals/Pain Pain Assessment: No/denies pain    Home Living Family/patient expects to be discharged to:: Private residence Living Arrangements: Alone Available Help at Discharge: Family;Available PRN/intermittently   Home Access: Stairs to enter Entrance Stairs-Rails: None Entrance Stairs-Number of Steps: 5 Home Layout: One level Home Equipment: Walker - 2 wheels;Walker - 4 wheels      Prior Function Level of Independence: Independent               Hand Dominance        Extremity/Trunk Assessment   Upper Extremity Assessment Upper Extremity Assessment: Defer to OT evaluation    Lower Extremity Assessment Lower Extremity Assessment: Generalized weakness    Cervical / Trunk Assessment Cervical / Trunk Assessment: Normal  Communication   Communication: No difficulties  Cognition Arousal/Alertness: Awake/alert Behavior During Therapy: WFL for tasks assessed/performed Overall Cognitive Status: Impaired/Different from baseline Area of Impairment: Safety/judgement;Awareness                         Safety/Judgement: Decreased awareness of deficits            General Comments      Exercises     Assessment/Plan    PT  Assessment Patient needs continued PT services  PT Problem List Decreased strength;Decreased activity tolerance;Decreased balance;Decreased mobility       PT Treatment Interventions DME instruction;Gait training;Functional mobility training;Therapeutic activities;Patient/family education;Cognitive remediation    PT Goals (Current goals can be found in the Care Plan section)  Acute Rehab PT Goals Patient Stated Goal: to go home PT Goal Formulation:  With patient/family Time For Goal Achievement: 09/16/17 Potential to Achieve Goals: Good    Frequency Min 3X/week   Barriers to discharge Decreased caregiver support      Co-evaluation               AM-PAC PT "6 Clicks" Daily Activity  Outcome Measure Difficulty turning over in bed (including adjusting bedclothes, sheets and blankets)?: None Difficulty moving from lying on back to sitting on the side of the bed? : None Difficulty sitting down on and standing up from a chair with arms (e.g., wheelchair, bedside commode, etc,.)?: A Little Help needed moving to and from a bed to chair (including a wheelchair)?: A Little Help needed walking in hospital room?: A Little Help needed climbing 3-5 steps with a railing? : A Lot 6 Click Score: 19    End of Session Equipment Utilized During Treatment: Gait belt Activity Tolerance: Patient tolerated treatment well Patient left: in chair;with call bell/phone within reach;with chair alarm set;with family/visitor present Nurse Communication: Mobility status PT Visit Diagnosis: Difficulty in walking, not elsewhere classified (R26.2)    Time: 4142-3953 PT Time Calculation (min) (ACUTE ONLY): 9 min   Charges:   PT Evaluation $PT Eval Low Complexity: 1 Low     PT G CodesTresa Endo PT [phone]   [name] 09/02/2017, 12:48 PM

## 2017-09-02 NOTE — Evaluation (Signed)
Occupational Therapy Evaluation Patient Details Name: [name] MRN: [id number] DOB: 09/16/1951 Today's Date: 09/02/2017    History of Present Illness [name] is a 66 yo female with medical history significant for depression, anxiety, pseudodementia with memory deficits, chronic low back pain , COPD, type 2 diabetes mellitus, coronary artery disease status post CABG, and status post gastric bypass surgery. She presented to the emergency department 08/31/17 with acute encephalopathy after being found down at home   Clinical Impression   Pt admitted with acute encephalopathy. Pt currently with functional limitations due to the deficits listed below (see OT Problem List).  Pt will benefit from skilled OT to increase their safety and independence with ADL and functional mobility for ADL to facilitate discharge to venue listed below.      Follow Up Recommendations  Home health OT;Supervision/Assistance - 24 hour    Equipment Recommendations  None recommended by OT    Recommendations for Other Services       Precautions / Restrictions Precautions Precautions: Fall      Mobility Bed Mobility               General bed mobility comments: in recliner  Transfers Overall transfer level: Needs assistance   Transfers: Sit to/from Stand Sit to Stand: Min assist         General transfer comment: declined using walker although she say she has one at home    Balance Overall balance assessment: Needs assistance;History of Falls Sitting-balance support: No upper extremity supported;Feet supported Sitting balance-Leahy Scale: Good     Standing balance support: During functional activity;No upper extremity supported Standing balance-Leahy Scale: Poor                             ADL either performed or assessed with clinical judgement   ADL Overall ADL's : Needs assistance/impaired Eating/Feeding: Set up;Sitting   Grooming: Sitting;Set up   Upper  Body Bathing: Set up;Sitting   Lower Body Bathing: Minimal assistance;Sit to/from stand;Cueing for safety;Cueing for sequencing   Upper Body Dressing : Set up;Sitting   Lower Body Dressing: Minimal assistance;Sit to/from stand;Cueing for safety;Cueing for sequencing;Moderate assistance   Toilet Transfer: Minimal assistance;BSC;Stand-pivot   Toileting- Clothing Manipulation and Hygiene: Minimal assistance;Sit to/from stand;Cueing for safety;Cueing for sequencing               Vision Patient Visual Report: No change from baseline              Pertinent Vitals/Pain Pain Assessment: No/denies pain     Hand Dominance     Extremity/Trunk Assessment Upper Extremity Assessment Upper Extremity Assessment: Generalized weakness   Lower Extremity Assessment Lower Extremity Assessment: Generalized weakness   Cervical / Trunk Assessment Cervical / Trunk Assessment: Normal   Communication Communication Communication: No difficulties   Cognition Arousal/Alertness: Awake/alert Behavior During Therapy: WFL for tasks assessed/performed Overall Cognitive Status: Impaired/Different from baseline Area of Impairment: Safety/judgement;Awareness                         Safety/Judgement: Decreased awareness of deficits                    Home Living Family/patient expects to be discharged to:: Private residence Living Arrangements: Alone Available Help at Discharge: Family;Available PRN/intermittently   Home Access: Stairs to enter Entrance Stairs-Number of Steps: 5 Entrance Stairs-Rails: None Home Layout: One level  Bathroom Shower/Tub: Occupational psychologist: Standard     Home Equipment: Environmental consultant - 2 wheels;Walker - 4 wheels          Prior Functioning/Environment Level of Independence: Independent                 OT Problem List: Decreased strength;Decreased activity tolerance;Decreased knowledge of use of DME or AE      OT  Treatment/Interventions: Self-care/ADL training;Patient/family education;DME and/or AE instruction    OT Goals(Current goals can be found in the care plan section) Acute Rehab OT Goals Patient Stated Goal: to go home OT Goal Formulation: With patient Time For Goal Achievement: 09/09/17 Potential to Achieve Goals: Good  OT Frequency: Min 2X/week              AM-PAC PT "6 Clicks" Daily Activity     Outcome Measure Help from another person eating meals?: None Help from another person taking care of personal grooming?: A Little Help from another person toileting, which includes using toliet, bedpan, or urinal?: A Lot Help from another person bathing (including washing, rinsing, drying)?: A Little Help from another person to put on and taking off regular upper body clothing?: A Little Help from another person to put on and taking off regular lower body clothing?: A Lot 6 Click Score: 17   End of Session Equipment Utilized During Treatment: Gait belt Nurse Communication: Mobility status  Activity Tolerance: Patient limited by fatigue Patient left: in chair  OT Visit Diagnosis: Unsteadiness on feet (R26.81);Muscle weakness (generalized) (M62.81)                Time: 1254-1310 OT Time Calculation (min): 16 min Charges:  OT General Charges $OT Visit: 1 Visit OT Evaluation $OT Eval Moderate Complexity: 1 Mod G-Codes:     [name], OT [phone]  [name] D 09/02/2017, 2:03 PM

## 2017-09-02 NOTE — Care Management Note (Signed)
Case Management Note  Patient Details  Name: [name] MRN: [id number] Date of Birth: 07-07-1951  Subjective/Objective:  Found down, AMS, coffee-ground emesis.               Action/Plan:  Plan to discharge home with family and HH.   Expected Discharge Date:   08/29/2017              Expected Discharge Plan:  Home/Self Care  In-House Referral:  Clinical Social Work  Discharge planning Services  CM Consult  Post Acute Care Choice:    Choice offered to:  Patient  DME Arranged:    DME Agency:     HH Arranged:  RN, PT, Nurse's Aide [place] Agency:  [place]  Status of Service:  Completed, signed off  If discussed at [place] of Stay Meetings, dates discussed:    Additional CommentsPurcell Mouton, RN 09/02/2017, 12:04 PM

## 2017-09-02 NOTE — Plan of Care (Signed)
Problem: Activity: Goal: Risk for activity intolerance will decrease Outcome: Progressing For PT eval today.  Problem: Fluid Volume: Goal: Ability to maintain a balanced intake and output will improve Outcome: Progressing Tolerating full liquids.   Problem: Nutrition: Goal: Adequate nutrition will be maintained Outcome: Progressing Tolerating full liquids.

## 2017-09-03 DIAGNOSIS — G9341 Metabolic encephalopathy: Secondary | ICD-10-CM

## 2017-09-03 LAB — BASIC METABOLIC PANEL
Anion gap: 11 (ref 5–15)
CO2: 24 mmol/L (ref 22–32)
Calcium: 8.3 mg/dL — ABNORMAL LOW (ref 8.9–10.3)
Chloride: 101 mmol/L (ref 101–111)
Creatinine, Ser: 0.51 mg/dL (ref 0.44–1.00)
GFR calc Af Amer: >60 mL/min (ref >60)
GFR calc non Af Amer: >60 mL/min (ref >60)
GLUCOSE: 130 mg/dL — AB (ref 65–99)
POTASSIUM: 3.4 mmol/L — AB (ref 3.5–5.1)
Sodium: 136 mmol/L (ref 135–145)

## 2017-09-03 LAB — CBC WITH DIFFERENTIAL/PLATELET
BASOS ABS: 0 10*3/uL (ref 0.0–0.1)
BASOS PCT: 0 %
Eosinophils Absolute: 0.1 10*3/uL (ref 0.0–0.7)
Eosinophils Relative: 1 %
HEMATOCRIT: 30.7 % — AB (ref 36.0–46.0)
Hemoglobin: 9.9 g/dL — ABNORMAL LOW (ref 12.0–15.0)
LYMPHS PCT: 19 %
Lymphs Abs: 1.4 10*3/uL (ref 0.7–4.0)
MCH: 23.3 pg — ABNORMAL LOW (ref 26.0–34.0)
MCHC: 32.2 g/dL (ref 30.0–36.0)
MCV: 72.2 fL — AB (ref 78.0–100.0)
MONO ABS: 0.5 10*3/uL (ref 0.1–1.0)
Monocytes Relative: 7 %
NEUTROS ABS: 5.2 10*3/uL (ref 1.7–7.7)
Neutrophils Relative %: 73 %
PLATELETS: 147 10*3/uL — AB (ref 150–400)
RBC: 4.25 MIL/uL (ref 3.87–5.11)
RDW: 16.8 % — ABNORMAL HIGH (ref 11.5–15.5)
WBC: 7.1 10*3/uL (ref 4.0–10.5)

## 2017-09-03 LAB — GLUCOSE, CAPILLARY
GLUCOSE-CAPILLARY: 244 mg/dL — AB (ref 65–99)
GLUCOSE-CAPILLARY: 172 mg/dL — AB (ref 65–99)
GLUCOSE-CAPILLARY: 116 mg/dL — AB (ref 65–99)
Glucose-Capillary: 149 mg/dL — ABNORMAL HIGH (ref 65–99)

## 2017-09-03 MED ORDER — POTASSIUM CHLORIDE CRYS ER 20 MEQ PO TBCR
40.0000 meq | EXTENDED_RELEASE_TABLET | Freq: Once | ORAL | Status: AC
  Administered 2017-09-03: 40 meq via ORAL
  Filled 2017-09-03: qty 2

## 2017-09-03 MED ORDER — FERROUS SULFATE 325 (65 FE) MG PO TABS
325.0000 mg | ORAL_TABLET | Freq: Two times a day (BID) | ORAL | Status: DC
  Administered 2017-09-03 – 2017-09-04 (×4): 325 mg via ORAL
  Filled 2017-09-03 (×4): qty 1

## 2017-09-03 MED ORDER — INSULIN GLARGINE 100 UNIT/ML ~~LOC~~ SOLN
10.0000 [IU] | Freq: Every day | SUBCUTANEOUS | Status: DC
  Administered 2017-09-03: 10 [IU] via SUBCUTANEOUS
  Filled 2017-09-03 (×2): qty 0.1

## 2017-09-03 MED ORDER — SENNA 8.6 MG PO TABS
1.0000 | ORAL_TABLET | Freq: Two times a day (BID) | ORAL | Status: DC
  Administered 2017-09-03 – 2017-09-04 (×3): 8.6 mg via ORAL
  Filled 2017-09-03 (×3): qty 1

## 2017-09-03 NOTE — Clinical Social Work Note (Signed)
Clinical Social Work Assessment  Patient Details  Name: [name] MRN: [id number] Date of Birth: 10/06/1951  Date of referral:  09/03/17               Reason for consult:  Other (Comment Required) (Patient forgetful and may have mismanaged medication)                Permission sought to share information with:    Permission granted to share information::  No  Name::        Agency::     Relationship::     Contact Information:     Housing/Transportation Living arrangements for the past 2 months:  Single Family Home Source of Information:  Patient Patient Interpreter Needed:  None Criminal Activity/Legal Involvement Pertinent to Current Situation/Hospitalization:  No - Comment as needed Significant Relationships:  Siblings, Friend, Pets Lives with:  Self Do you feel safe going back to the place where you live?  Yes Need for family participation in patient care:  No (Coment)  Care giving concerns:  Patient resides alone and has been independent with ADLs. Patient reported that her brother manages her medications and that she aware that she may have taken too many pain pills. Patient reported that she will not do that again referencing overtaking medications and that her brother will continue to manage her medications. Patient reported that her brother is extra supportive and will be providing additional support at discharge. PT recommending Home Health.    Social Worker assessment / plan:  CSW and BSW intern spoke with patient at bedside regarding consult for mismanaged medications. Patient reported that her brother manages her medications and arranges them 30 days at a time. Patient reported that she took too much pain medication and that her brother found her laying down. Patient reported that her brother is a great support along with her dog and other 2 siblings. Patient reported that in addition to family she has a great network of neighbors and friends. Patient reported that she  had 2 children noting that her son died and she does not speak with her daughter. CSW and patient discussed how patient has dealt with the loss. Patient reported that she just gave it to god and reported that her faith is a source of strength for her. CSW provided patient with emotional support and positively affirmed her coping skills. Patient reported that she plans to discharge home with home health.   Employment status:  Retired Forensic scientist:  Medicare PT Recommendations:  Home with [place] / Referral to community resources:     Patient/Family's Response to care:  Patient appreciative of CSW intervention.    Patient/Family's Understanding of and Emotional Response to Diagnosis, Current Treatment, and Prognosis:  Patient presented pleasant throughout assessment and was very open when speaking about her life. Patient verbalized understanding of diagnosis and  current treatment. Patient reported that she feels safe returning home with home health and assistance from her brother.   Emotional Assessment Appearance:  Appears stated age Attitude/Demeanor/Rapport:  Other (Cooperative) Affect (typically observed):  Pleasant Orientation:  Oriented to Self, Oriented to Place, Oriented to  Time, Oriented to Situation Alcohol / Substance use:  Not Applicable Psych involvement (Current and /or in the community):  No (Comment)  Discharge Needs  Concerns to be addressed:  No discharge needs identified Readmission within the last 30 days:  No Current discharge risk:  None Barriers to Discharge:  No Barriers Identified   Burnis Medin,  LCSW 09/03/2017, 4:26 PM

## 2017-09-03 NOTE — Progress Notes (Signed)
Occupational Therapy Treatment Patient Details Name: [name] MRN: [id number] DOB: 19-Jul-1951 Today's Date: 09/03/2017    History of present illness [name] is a 66 yo female with medical history significant for depression, anxiety, pseudodementia with memory deficits, chronic low back pain , COPD, type 2 diabetes mellitus, coronary artery disease status post CABG, and status post gastric bypass surgery. She presented to the emergency department 08/31/17 with acute encephalopathy after being found down at home   OT comments  Pt fatigues quickly. Educated pt on taking rest breaks as needed.  Follow Up Recommendations  Home health OT;Supervision/Assistance - 24 hour    Equipment Recommendations  None recommended by OT    Recommendations for Other Services      Precautions / Restrictions Precautions Precautions: Fall Restrictions Weight Bearing Restrictions: No       Mobility Bed Mobility Overal bed mobility: Needs Assistance Bed Mobility: Supine to Sit     Supine to sit: Supervision     General bed mobility comments: VC for technique  Transfers Overall transfer level: Needs assistance Equipment used: Rolling walker (2 wheeled) Transfers: Sit to/from [facility] Sit to Stand: Min guard Stand pivot transfers: Min guard       General transfer comment: VC for technique        ADL either performed or assessed with clinical judgement   ADL Overall ADL's : Needs assistance/impaired                     Lower Body Dressing: Supervision/safety;Sit to/from stand;Cueing for safety;Cueing for sequencing;Cueing for compensatory techniques Lower Body Dressing Details (indicate cue type and reason): increased time. Pt fatigued VERY quickly.   Toilet Transfer: Min guard;RW;Cueing for sequencing;Cueing for Designer, television/film set and Hygiene: Supervision/safety;Sit to/from stand;Cueing for safety;Cueing for sequencing       Functional mobility during ADLs: Minimal assistance General ADL Comments: pt lives alone but states brother can A as needed. Feel pt will benefit from increased A from brother as well as [place] to ensure pt in I at home     Vision Patient Visual Report: No change from baseline     Perception     Praxis      Cognition Arousal/Alertness: Awake/alert Behavior During Therapy: WFL for tasks assessed/performed Overall Cognitive Status: Within Functional Limits for tasks assessed                                                     Pertinent Vitals/ Pain       Pain Assessment: No/denies pain         Frequency  Min 2X/week        Progress Toward Goals  OT Goals(current goals can now be found in the care plan section)  Progress towards OT goals: Progressing toward goals     Plan Discharge plan remains appropriate    Co-evaluation                 AM-PAC PT "6 Clicks" Daily Activity     Outcome Measure   Help from another person eating meals?: None Help from another person taking care of personal grooming?: A Little Help from another person toileting, which includes using toliet, bedpan, or urinal?: A Little Help from another person bathing (including washing, rinsing, drying)?: A Little Help  from another person to put on and taking off regular upper body clothing?: A Little Help from another person to put on and taking off regular lower body clothing?: A Little 6 Click Score: 19    End of Session Equipment Utilized During Treatment: Gait belt;Rolling walker  OT Visit Diagnosis: Unsteadiness on feet (R26.81);Muscle weakness (generalized) (M62.81)   Activity Tolerance Patient tolerated treatment well   Patient Left in chair   Nurse Communication Mobility status        Time: 0945-1000 OT Time Calculation (min): 15 min  Charges: OT General Charges $OT Visit: 1 Visit OT Treatments $Self Care/Home Management : 8-22 mins  [name], [name]   [name] 09/03/2017, 10:17 AM
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
## 2017-09-04 DIAGNOSIS — M6282 Rhabdomyolysis: Principal | ICD-10-CM

## 2017-09-04 DIAGNOSIS — N3000 Acute cystitis without hematuria: Secondary | ICD-10-CM

## 2017-09-04 DIAGNOSIS — T796XXA Traumatic ischemia of muscle, initial encounter: Secondary | ICD-10-CM

## 2017-09-04 LAB — BASIC METABOLIC PANEL
ANION GAP: 8 (ref 5–15)
BUN: 7 mg/dL (ref 6–20)
CHLORIDE: 102 mmol/L (ref 101–111)
CO2: 26 mmol/L (ref 22–32)
Calcium: 8.3 mg/dL — ABNORMAL LOW (ref 8.9–10.3)
Creatinine, Ser: 0.61 mg/dL (ref 0.44–1.00)
GFR calc non Af Amer: >60 mL/min (ref >60)
GLUCOSE: 108 mg/dL — AB (ref 65–99)
POTASSIUM: 4.1 mmol/L (ref 3.5–5.1)
Sodium: 136 mmol/L (ref 135–145)

## 2017-09-04 LAB — CBC WITH DIFFERENTIAL/PLATELET
Basophils Absolute: 0 10*3/uL (ref 0.0–0.1)
Basophils Relative: 0 %
Eosinophils Absolute: 0.1 10*3/uL (ref 0.0–0.7)
Eosinophils Relative: 2 %
HEMATOCRIT: 29.3 % — AB (ref 36.0–46.0)
HEMOGLOBIN: 9.3 g/dL — AB (ref 12.0–15.0)
LYMPHS ABS: 1.7 10*3/uL (ref 0.7–4.0)
LYMPHS PCT: 27 %
MCH: 23.1 pg — ABNORMAL LOW (ref 26.0–34.0)
MCHC: 31.7 g/dL (ref 30.0–36.0)
MCV: 72.9 fL — AB (ref 78.0–100.0)
MONO ABS: 0.5 10*3/uL (ref 0.1–1.0)
MONOS PCT: 8 %
NEUTROS ABS: 3.9 10*3/uL (ref 1.7–7.7)
NEUTROS PCT: 63 %
Platelets: 146 10*3/uL — ABNORMAL LOW (ref 150–400)
RBC: 4.02 MIL/uL (ref 3.87–5.11)
RDW: 17 % — AB (ref 11.5–15.5)
WBC: 6.1 10*3/uL (ref 4.0–10.5)

## 2017-09-04 LAB — URINE CULTURE: Culture: >=100000 — AB

## 2017-09-04 LAB — GLUCOSE, CAPILLARY
Glucose-Capillary: 122 mg/dL — ABNORMAL HIGH (ref 65–99)
Glucose-Capillary: 135 mg/dL — ABNORMAL HIGH (ref 65–99)
Glucose-Capillary: 153 mg/dL — ABNORMAL HIGH (ref 65–99)

## 2017-09-04 LAB — ALT: ALT: 43 U/L (ref 14–54)

## 2017-09-04 LAB — AST: AST: 44 U/L — AB (ref 15–41)

## 2017-09-04 MED ORDER — ACETAMINOPHEN 500 MG PO TABS: 1000.0000 mg | ORAL_TABLET | Freq: Three times a day (TID) | ORAL | 0 refills | Status: DC | PRN

## 2017-09-04 MED ORDER — CEPHALEXIN 500 MG PO CAPS: 500.0000 mg | ORAL_CAPSULE | Freq: Two times a day (BID) | ORAL | 0 refills | Status: DC

## 2017-09-04 MED ORDER — DEXTROSE 5 % IV SOLN
1.0000 g | INTRAVENOUS | Status: DC
  Administered 2017-09-04: 1 g via INTRAVENOUS
  Filled 2017-09-04: qty 10

## 2017-09-04 NOTE — Progress Notes (Signed)
Pt discharged with brother [name]. Denies pain or discomfort. Discharged instructions reviewed with pt and [name]. Verbalized understanding and able to [place] instructions

## 2017-09-04 NOTE — Progress Notes (Signed)
MD ok to DC pt. MD spoke with pt brother, [name]. Called jeff, will no tbe able to pick pt up for another 1.5 hours.
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
## 2017-09-04 NOTE — Progress Notes (Signed)
Physical Therapy Treatment Patient Details Name: [name] MRN: [id number] DOB: 17-Jul-1951 Today's Date: 09/04/2017    History of Present Illness [name] is a 66 yo female with medical history significant for depression, anxiety, pseudodementia with memory deficits, chronic low back pain , COPD, type 2 diabetes mellitus, coronary artery disease status post CABG, and status post gastric bypass surgery. She presented to the emergency department 08/31/17 with acute encephalopathy after being found down at home    PT Comments    Pt progressing well with mobility, she ambulated 200' with RW today.   Follow Up Recommendations  Home health PT;Supervision/Assistance - 24 hour     Equipment Recommendations  None recommended by PT    Recommendations for Other Services       Precautions / Restrictions Precautions Precautions: Fall Restrictions Weight Bearing Restrictions: No    Mobility  Bed Mobility Overal bed mobility: Needs Assistance Bed Mobility: Supine to Sit     Supine to sit: Min assist     General bed mobility comments: min A to raise trunk  Transfers Overall transfer level: Needs assistance Equipment used: Rolling walker (2 wheeled) Transfers: Sit to/from [facility] Sit to Stand: Min guard Stand pivot transfers: Min guard       General transfer comment: VC for hand placement  Ambulation/Gait Ambulation/Gait assistance: Min guard Ambulation Distance (Feet): 200 Feet Assistive device: Rolling walker (2 wheeled) Gait Pattern/deviations: Step-through pattern;Decreased stride length   Gait velocity interpretation: at or above normal speed for age/gender General Gait Details: steady with RW   Stairs            Wheelchair Mobility    Modified Rankin (Stroke Patients Only)       Balance Overall balance assessment: Needs assistance;History of Falls Sitting-balance support: No upper extremity supported;Feet  supported Sitting balance-Leahy Scale: Good     Standing balance support: Bilateral upper extremity supported Standing balance-Leahy Scale: Fair                              Cognition Arousal/Alertness: Awake/alert Behavior During Therapy: WFL for tasks assessed/performed Overall Cognitive Status: Within Functional Limits for tasks assessed                                        Exercises      General Comments        Pertinent Vitals/Pain      Home Living                      Prior Function            PT Goals (current goals can now be found in the care plan section) Acute Rehab PT Goals Patient Stated Goal: to go home PT Goal Formulation: With patient/family Time For Goal Achievement: 09/16/17 Potential to Achieve Goals: Good Progress towards PT goals: Progressing toward goals    Frequency    Min 3X/week      PT Plan Current plan remains appropriate    Co-evaluation              AM-PAC PT "6 Clicks" Daily Activity  Outcome Measure  Difficulty turning over in bed (including adjusting bedclothes, sheets and blankets)?: None Difficulty moving from lying on back to sitting on the side of the bed? : Unable Difficulty sitting  down on and standing up from a chair with arms (e.g., wheelchair, bedside commode, etc,.)?: A Little Help needed moving to and from a bed to chair (including a wheelchair)?: A Little Help needed walking in hospital room?: A Little Help needed climbing 3-5 steps with a railing? : A Lot 6 Click Score: 16    End of Session Equipment Utilized During Treatment: Gait belt Activity Tolerance: Patient tolerated treatment well Patient left: in chair;with call bell/phone within reach;with family/visitor present Nurse Communication: Mobility status PT Visit Diagnosis: Difficulty in walking, not elsewhere classified (R26.2)     Time: 8592-9244 PT Time Calculation (min) (ACUTE ONLY): 28  min  Charges:  $Gait Training: 8-22 mins $Therapeutic Activity: 8-22 mins                    G Codes:          Philomena Doheny 09/04/2017, 12:53 PM [phone]

## 2017-09-04 NOTE — Progress Notes (Signed)
RT placed patient on CPAP. Patient setting is Auto 5-15 cmH2O. Sterile water added to water chamber for humidification. 2 liters oxygen bleed into tubing. Patient is tolerating well.

## 2017-09-04 NOTE — Care Management Important Message (Signed)
Important Message  Patient Details  Name: [name] MRN: [id number] Date of Birth: [birth year]   Medicare Important Message Given:  Yes    Kerin Salen 09/04/2017, 10:24 AMImportant Message  Patient Details  Name: [name] MRN: [id number] Date of Birth: 1951/06/13   Medicare Important Message Given:  Yes    Kerin Salen 09/04/2017, 10:24 AM

## 2017-09-05 MED ORDER — CEPHALEXIN 500 MG PO CAPS: 500.0000 mg | ORAL_CAPSULE | Freq: Two times a day (BID) | ORAL | 0 refills | Status: DC

## 2017-09-05 MED ORDER — CEPHALEXIN 500 MG PO CAPS: 500.0000 mg | ORAL_CAPSULE | Freq: Two times a day (BID) | ORAL | 0 refills | Status: AC

## 2017-09-06 LAB — CULTURE, BLOOD (ROUTINE X 2)
CULTURE: NO GROWTH
Culture: NO GROWTH
SPECIAL REQUESTS: ADEQUATE
SPECIAL REQUESTS: ADEQUATE

## 2017-11-12 ENCOUNTER — Encounter: Payer: Self-pay | Admitting: Internal Medicine

## 2017-11-12 ENCOUNTER — Ambulatory Visit (INDEPENDENT_AMBULATORY_CARE_PROVIDER_SITE_OTHER): Payer: Medicare Other | Admitting: Internal Medicine

## 2017-11-12 VITALS — BP 122/76 | HR 74 | Ht 62.0 in | Wt 153.0 lb

## 2017-11-12 DIAGNOSIS — G4731 Primary central sleep apnea: Secondary | ICD-10-CM | POA: Diagnosis not present

## 2017-11-12 DIAGNOSIS — I69911 Memory deficit following unspecified cerebrovascular disease: Secondary | ICD-10-CM

## 2017-11-12 DIAGNOSIS — I6523 Occlusion and stenosis of bilateral carotid arteries: Secondary | ICD-10-CM | POA: Diagnosis not present

## 2017-11-12 DIAGNOSIS — G4733 Obstructive sleep apnea (adult) (pediatric): Secondary | ICD-10-CM

## 2017-11-12 NOTE — Patient Instructions (Signed)
Order- DME [facility]-   Please change autopap range to 4-10, continue mask of choice, humidifier, supplies, AirView    Dx OSA    Please remember we want you to wear CPAP all night, every night. Try not to take it off. If you do take it off, try to put it right back on again.

## 2017-11-12 NOTE — Assessment & Plan Note (Signed)
Successful CPAP titration.  Unfortunately compliance is not meeting goals.  I am not convinced she is aware and mentally capable of managing CPAP at night, keeping it on enough to help.  Her sons hope that using CPAP will improve her memory.

## 2017-11-12 NOTE — Assessment & Plan Note (Signed)
Her sons are watching her closely, but she did not recall that she had been in the hospital in September and does not seem to be managing CPAP effectively now.  I am concerned that she probably should no longer be living alone.

## 2017-11-12 NOTE — Progress Notes (Signed)
08/09/17-66 year old female former smoker for sleep evaluation. Referred courtesy o fDr Dohmeier-had sleep study  7.13.18-no CPAP NPSG at [facility] 06/28/17- AHI 68/ hr, desaturation to 59%, recording 75 minutes below 89% saturation, body weight 144 pounds. Patient was noted to get confused. She had nausea and vomiting on attempt to use CPAP.  Diagnosis from that study was severe Central Dominant Complex Sleep Apnea, associated with severe sleep hypoxemia. Extremely fragmented sleep.  She was referred to pulmonary for consideration of BiPAP or ASV and for consideration of nocturnal supplemental oxygen. Medical history as forwarded included history of depression, anxiety, bilateral carotid artery occlusion, CAD/ CABG 1994, chronic anemia, CHF, DM 2, glaucoma, hyperlipidemia, HBP, OSA prior to weight loss surgery in 1988/gastric bypass surgery, CVA, severe protein calorie malnutrition, gradual memory loss over 3 years, and more recent severe depression and confusion. She lives alone and comes today with brother [name], saying that her memory is not good. She had been a 2 pack per day smoker but has quit. Epworth sleepiness score 16/24 Sleeping habit is bedtime between 10 PM and midnight, uncertain sleep latency, bathroom 3 or 4 times and usually up between 7 and 8 AM. Little caffeine and no sleep medicines.. Sleep environment as comfortable although she does have backaches. She does not usually cough or wheeze. Aware of some shortness of breath with exertion. Denies chest pain, palpitation or swelling feet.  Previous atrial fibrillation treated with ablation. Still followed by cardiology with diagnosis of chronic diastolic heart failure. Office Spirometry 08/09/17-WNL-FVC 2.33/82%, FEV1 1.78/82%, ratio 0.76, FEF 25-75% 1.57/81% Walk Test on room air 08/09/17-3 laps-did not drop below 96% on room air with maximum heart rate 88. No oxygen limitation to exercise.  11/12/17-66 year old female  former smoker followed for severe complex sleep apnea, central dominant, complicated by depression, anxiety, bilateral carotid artery occlusion, CAD/ CABG 1994, chronic anemia, CHF, DM 2, glaucoma, hyperlipidemia, HBP, OSA prior to weight loss surgery in 1988/gastric bypass surgery, CVA, severe protein calorie malnutrition, gradual memory loss over 3 years, and more recent severe depression and confusion. Here today with 2 sons but continues to live alone. Recent hospitalization for rhabdomyolysis, acute encephalopathy, likely due to opioid-induced overdose, UTI.     She did not remember today that she had been in the hospital in September. CPAP titration sleep study 08/13/17-titrated to 15 CWP with no CSA noted.  Nadir oxygen saturation 92%.  Moderate periodic limb movement ---OSA; IRJ:JOACZYSA. Pt states she is able to wear CPAP machine most of the time but has noticed that she takes if off through the night. DL is attached.  Download records 0% 4-hour compliance, averaging only 47 minutes per night on the night she used, with AHI 0.9/hour.  When I pointed out these results, she said she gets up every hour or so for bathroom and does not put the CPAP back on again.  I explained the CPAP importance and compliance goals carefully with both sons present.  ROS-see HPI   + = Positive Constitutional:    weight loss, night sweats, fevers, chills, fatigue, lassitude. HEENT:   + headaches, difficulty swallowing, tooth/dental problems, sore throat,       sneezing, itching, ear ache, nasal congestion, post nasal drip, snoring CV:    chest pain, orthopnea, PND, swelling in lower extremities, anasarca,  dizziness, palpitations Resp:   + shortness of breath with exertion or at rest.                productive cough,   non-productive cough, coughing up of blood.              change in color of mucus.  wheezing.   Skin:    rash or lesions. GI:  +   heartburn,  indigestion, abdominal pain, nausea, vomiting, diarrhea,                 change in bowel habits, loss of appetite GU: dysuria, change in color of urine, no urgency or frequency.   flank pain. MS:   joint pain, stiffness, decreased range of motion, back pain. Neuro-      Psych:  change in mood or affect.  + depression or anxiety.  + memory loss.  OBJ- Physical Exam General- Alert, Oriented, Affect-appropriate, Distress- none acute Skin- rash-none, lesions- none, excoriation- none, + very pale Lymphadenopathy- none Head- atraumatic            Eyes- Gross vision intact, PERRLA, conjunctivae and secretions clear            Ears- Hearing, canals-normal            Nose- Clear, no-Septal dev, mucus, polyps, erosion, perforation             Throat- Mallampati II , mucosa clear , drainage- none, tonsils- atrophic Neck- flexible , trachea midline, no stridor , thyroid nl, carotid no bruit Chest - symmetrical excursion , unlabored           Heart/CV- RRR , no murmur , no gallop  , no rub, nl s1 s2                           - JVD- none , edema- none, stasis changes- none, varices- none           Lung- clear to P&A, wheeze- none, cough- none , dullness-none, rub- none           Chest wall-  Abd-  Br/ Gen/ Rectal- Not done, not indicated Extrem- cyanosis- none, clubbing, none, atrophy- none, strength- nl Neuro- she demonstrated memory deficit, unable to remember details of her previous sleep study

## 2018-02-12 ENCOUNTER — Ambulatory Visit: Payer: Federal, State, Local not specified - PPO | Admitting: Internal Medicine

## 2018-02-18 ENCOUNTER — Ambulatory Visit: Payer: Medicare Other | Admitting: Neurology

## 2018-02-19 ENCOUNTER — Encounter: Payer: Self-pay | Admitting: Neurology

## 2018-03-25 ENCOUNTER — Other Ambulatory Visit: Payer: Self-pay | Admitting: Physician Assistant

## 2018-03-25 DIAGNOSIS — IMO0001 Reserved for inherently not codable concepts without codable children: Secondary | ICD-10-CM

## 2018-03-25 DIAGNOSIS — E1165 Type 2 diabetes mellitus with hyperglycemia: Principal | ICD-10-CM

## 2018-07-18 ENCOUNTER — Encounter: Payer: Self-pay | Admitting: Neurology

## 2018-09-22 ENCOUNTER — Telehealth: Payer: Self-pay | Admitting: Neurology

## 2018-09-22 NOTE — Telephone Encounter (Signed)
[name], can you download a cpap report for me? She has Central Dominant Complex Sleep Apnea, associated with severe sleep hypoxemia, severe. If she is not using her cpap I will defer seeing her tomorrow for memory loss as this is likely a huge contributor if not treated. I will call and ask her follow back up with me after being on the cpap for 6 months with compliance. thanks

## 2018-09-23 ENCOUNTER — Ambulatory Visit: Payer: Medicare Other | Admitting: Neurology

## 2018-09-23 ENCOUNTER — Telehealth: Payer: Self-pay | Admitting: Neurology

## 2018-09-23 ENCOUNTER — Encounter

## 2018-09-23 NOTE — Telephone Encounter (Signed)
Noted, appt canceled.

## 2018-09-23 NOTE — Telephone Encounter (Signed)
I called patient, left a message. If she has not been using her cpap will reschedule appointment today. Also tried calling brother and sister on DPR did not pick up.

## 2018-09-23 NOTE — Telephone Encounter (Signed)
See other phone note for f/u.

## 2018-09-23 NOTE — Telephone Encounter (Signed)
Attempted to download CPAP report. Pt has a chip. Portal says last updated 86 days, not compliant. Dr. Jaynee Eagles aware.

## 2018-09-23 NOTE — Telephone Encounter (Signed)
Spoke to patient's brother.  He was going to bring her in today for follow up on memory loss. As I explained in the past when I sent her to sleep study, OSA can cause significant cognitive issues and can be highly contributory or even completely causing her memory complaints. Brother confirmed She is not using her bipap/cpap. I suggest rescheduling appointment with Dr. Victorino Dike her pulmonologist, I can't assess her memory until she has been adequately treated for her severe complex sleep apnea. Please cancel appointment. They are welcome to follow up in the future.   Of note: NPSG at [facility] 06/28/17- AHI 68/ hr, desaturation to 59%, recording 75 minutes below 89% saturation, body weight 144 pounds. Patient was noted to get confused. Diagnosis from that study was severe Central Dominant Complex Sleep Apnea, associated with severe sleep hypoxemia. Extremely fragmented sleep.

## 2020-04-25 ENCOUNTER — Encounter: Payer: Self-pay | Admitting: General Practice

## 2020-10-09 ENCOUNTER — Emergency Department (HOSPITAL_COMMUNITY)
Admission: EM | Admit: 2020-10-09 | Discharge: 2020-10-09 | Disposition: A | Discharge status: Home / Self Care | Payer: Medicare Other | Attending: Emergency Medicine | Admitting: Emergency Medicine

## 2020-10-09 ENCOUNTER — Encounter (HOSPITAL_COMMUNITY): Payer: Self-pay | Admitting: Emergency Medicine

## 2020-10-09 DIAGNOSIS — I110 Hypertensive heart disease with heart failure: Secondary | ICD-10-CM | POA: Diagnosis not present

## 2020-10-09 DIAGNOSIS — Z96641 Presence of right artificial hip joint: Secondary | ICD-10-CM | POA: Insufficient documentation

## 2020-10-09 DIAGNOSIS — Y9389 Activity, other specified: Secondary | ICD-10-CM | POA: Diagnosis not present

## 2020-10-09 DIAGNOSIS — Z7982 Long term (current) use of aspirin: Secondary | ICD-10-CM | POA: Insufficient documentation

## 2020-10-09 DIAGNOSIS — Z7984 Long term (current) use of oral hypoglycemic drugs: Secondary | ICD-10-CM | POA: Insufficient documentation

## 2020-10-09 DIAGNOSIS — I2510 Atherosclerotic heart disease of native coronary artery without angina pectoris: Secondary | ICD-10-CM | POA: Insufficient documentation

## 2020-10-09 DIAGNOSIS — I5032 Chronic diastolic (congestive) heart failure: Secondary | ICD-10-CM | POA: Diagnosis not present

## 2020-10-09 DIAGNOSIS — Z91040 Latex allergy status: Secondary | ICD-10-CM | POA: Insufficient documentation

## 2020-10-09 DIAGNOSIS — Z87891 Personal history of nicotine dependence: Secondary | ICD-10-CM | POA: Insufficient documentation

## 2020-10-09 DIAGNOSIS — S59911A Unspecified injury of right forearm, initial encounter: Secondary | ICD-10-CM | POA: Diagnosis present

## 2020-10-09 DIAGNOSIS — W19XXXA Unspecified fall, initial encounter: Secondary | ICD-10-CM | POA: Insufficient documentation

## 2020-10-09 DIAGNOSIS — Z951 Presence of aortocoronary bypass graft: Secondary | ICD-10-CM | POA: Diagnosis not present

## 2020-10-09 DIAGNOSIS — Y92129 Unspecified place in nursing home as the place of occurrence of the external cause: Secondary | ICD-10-CM | POA: Insufficient documentation

## 2020-10-09 DIAGNOSIS — E119 Type 2 diabetes mellitus without complications: Secondary | ICD-10-CM | POA: Insufficient documentation

## 2020-10-09 DIAGNOSIS — J45909 Unspecified asthma, uncomplicated: Secondary | ICD-10-CM | POA: Diagnosis not present

## 2020-10-09 DIAGNOSIS — J449 Chronic obstructive pulmonary disease, unspecified: Secondary | ICD-10-CM | POA: Insufficient documentation

## 2020-10-09 DIAGNOSIS — S50811A Abrasion of right forearm, initial encounter: Secondary | ICD-10-CM | POA: Diagnosis not present
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
## 2020-10-09 NOTE — ED Notes (Signed)
[facility] called  

## 2020-10-09 NOTE — Discharge Instructions (Addendum)
Wound of the right forearm is an abrasion, the skin was lifted off it, and has been treated with cleansing and a dressing.  Remove the dressing daily, clean it well with soap and water, rinse, then reapply a light bandage, until the wound is healed.  This wound healing process may take 3 or 4 weeks.  Follow-up with PCP as needed for problems.
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
## 2020-10-09 NOTE — ED Notes (Signed)
Wound bandage

## 2020-10-15 ENCOUNTER — Other Ambulatory Visit: Payer: Self-pay

## 2020-10-15 ENCOUNTER — Emergency Department (HOSPITAL_COMMUNITY)
Admission: EM | Admit: 2020-10-15 | Discharge: 2020-10-15 | Disposition: A | Discharge status: Home / Self Care | Payer: Medicare Other | Source: Home / Self Care | Attending: Emergency Medicine | Admitting: Emergency Medicine

## 2020-10-15 ENCOUNTER — Emergency Department (HOSPITAL_COMMUNITY): Payer: Medicare Other

## 2020-10-15 ENCOUNTER — Encounter (HOSPITAL_COMMUNITY): Payer: Self-pay

## 2020-10-15 DIAGNOSIS — F0390 Unspecified dementia without behavioral disturbance: Secondary | ICD-10-CM | POA: Insufficient documentation

## 2020-10-15 DIAGNOSIS — Z91040 Latex allergy status: Secondary | ICD-10-CM | POA: Insufficient documentation

## 2020-10-15 DIAGNOSIS — Z96641 Presence of right artificial hip joint: Secondary | ICD-10-CM | POA: Insufficient documentation

## 2020-10-15 DIAGNOSIS — S0990XA Unspecified injury of head, initial encounter: Secondary | ICD-10-CM | POA: Insufficient documentation

## 2020-10-15 DIAGNOSIS — Z87891 Personal history of nicotine dependence: Secondary | ICD-10-CM | POA: Insufficient documentation

## 2020-10-15 DIAGNOSIS — W19XXXA Unspecified fall, initial encounter: Secondary | ICD-10-CM

## 2020-10-15 DIAGNOSIS — Z955 Presence of coronary angioplasty implant and graft: Secondary | ICD-10-CM | POA: Insufficient documentation

## 2020-10-15 DIAGNOSIS — I110 Hypertensive heart disease with heart failure: Secondary | ICD-10-CM | POA: Insufficient documentation

## 2020-10-15 DIAGNOSIS — R4182 Altered mental status, unspecified: Secondary | ICD-10-CM | POA: Diagnosis not present

## 2020-10-15 DIAGNOSIS — E119 Type 2 diabetes mellitus without complications: Secondary | ICD-10-CM | POA: Insufficient documentation

## 2020-10-15 DIAGNOSIS — Z7982 Long term (current) use of aspirin: Secondary | ICD-10-CM | POA: Insufficient documentation

## 2020-10-15 DIAGNOSIS — Y92129 Unspecified place in nursing home as the place of occurrence of the external cause: Secondary | ICD-10-CM | POA: Insufficient documentation

## 2020-10-15 DIAGNOSIS — I2510 Atherosclerotic heart disease of native coronary artery without angina pectoris: Secondary | ICD-10-CM | POA: Insufficient documentation

## 2020-10-15 DIAGNOSIS — Z7984 Long term (current) use of oral hypoglycemic drugs: Secondary | ICD-10-CM | POA: Insufficient documentation

## 2020-10-15 DIAGNOSIS — J45909 Unspecified asthma, uncomplicated: Secondary | ICD-10-CM | POA: Insufficient documentation

## 2020-10-15 DIAGNOSIS — I5033 Acute on chronic diastolic (congestive) heart failure: Secondary | ICD-10-CM | POA: Insufficient documentation

## 2020-10-15 DIAGNOSIS — G9341 Metabolic encephalopathy: Secondary | ICD-10-CM | POA: Diagnosis not present

## 2020-10-15 MED ORDER — ACETAMINOPHEN 500 MG PO TABS: 1000.0000 mg | ORAL_TABLET | Freq: Once | ORAL | Status: DC

## 2020-10-15 MED ORDER — QUETIAPINE FUMARATE 50 MG PO TABS: 25.0000 mg | ORAL_TABLET | Freq: Every day | ORAL | Status: DC

## 2020-10-15 NOTE — ED Notes (Signed)
Attempted to contact [facility]. Pt was being discharged and transported back via [facility]. No answer [phone]

## 2020-10-15 NOTE — ED Provider Notes (Signed)
[facility] DEPT Provider Note   CSN: [id number] Arrival date & time: 10/15/20  1719     History Chief Complaint  Patient presents with  . Fall    [name] is a 69 y.o. female BIB EMS who presents for evaluation of unwitnessed fall.  I discussed with [facility].  They report that she was trying to pick something up and thrown in the trash can.  Her aide turned back for a minute and when she turned around, patient had fallen and laid on the floor.  No LOC.  She is not on blood thinners.  She is at baseline.  EM LEVEL 5 CAVEAT DUE TO DEMENTIA  The history is provided by the patient.       Past Medical History:  Diagnosis Date  . Allergy   . Anxiety   . Arthritis   . Asthma   . 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 Bilateral carotid artery occlusion 09/25/2013  . CAD s/p CABG 1994 09/25/2013   LIMA-LAD, SVG Diag, SVG-RCA, [facility] 2010: Stents in the proximal LAD with 40% in-stent restenosis and 50% post-stent stenosis, 80% small OM 1, 70% small OM 2, 40% first PLA; diffuse 70% distal RCA; patent SVG to diagonal, patent SVG to distal RCA, patent atretic LIMA EF 55%; normal nuclear stress test 2011  . Cataract   . CHF (congestive heart failure) ([place])   . Chronic anemia   . Depression   . Diabetes mellitus without complication ([place])   . Diastolic dysfunction   . GERD (gastroesophageal reflux disease)   . Glaucoma   . Heart murmur   . History of recurrent UTIs   . Hyperlipidemia   . Hypertension   . Hypocalcemia   . Ischemic heart disease   . Migraine   . Morbid obesity ([place])    s/p gastric bypass  . Osteoporosis   . Peroneal neuropathy   . Retinopathy   . Stroke [facility])     Patient Active Problem List   Diagnosis Date Noted  . Rhabdomyolysis 09/01/2017  . Hypokalemia 09/01/2017  . Coffee ground emesis 09/01/2017  . SIRS (systemic inflammatory response syndrome) ([place]) 09/01/2017  . Acute lower  UTI 09/01/2017  . AKI (acute kidney injury) ([place]) 09/01/2017  . Chronic pain 09/01/2017  . Hematemesis   . 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 COPD mixed type ([place]) 08/09/2017  . 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 Diabetes mellitus type 2, uncontrolled, without complications 40/98/1191  . Amaurosis fugax of right eye 10/26/2015  . Depression 02/28/2015  . Bilateral leg edema, asymmetrical (R>L) 02/20/2015  . SVT (supraventricular tachycardia) ([place]) 12/31/2014  . Diabetes ([place]) 12/30/2014  . Primary osteoarthritis of right hip 12/27/2014  . Angina decubitus ([place]) 11/08/2014  . Hip joint pain 11/08/2014  . Chronic diastolic heart failure ([place]) 11/08/2014  . Acute on chronic diastolic heart failure ([place]) 09/23/2014  . Protein-calorie malnutrition, severe ([place]) 03/31/2014  . Hypocalcemia 03/30/2014  . Diarrhea 03/30/2014  . Bronchitis, acute 03/30/2014  . CAD s/p CABG 1994 09/25/2013  . Bilateral carotid artery occlusion 09/25/2013  . 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 H/O gastric bypass 09/25/2013  . Acute encephalopathy 09/25/2013  . History of Diastolic heart failure 47/82/9562  . Iron deficiency anemia 05/28/2012    Past Surgical History:  Procedure Laterality Date  . APPENDECTOMY    . CARDIAC CATHETERIZATION  03/30/2009   3 vessel CAD,patent grafts  . CHOLECYSTECTOMY    .  CORONARY ARTERY BYPASS GRAFT  1994   LIM to LAD,SVG to first diagonal,SVG to distal RCA  . GASTRIC BYPASS  2002  . HEEL SPUR EXCISION  2012  . HERNIA REPAIR    . NM MYOCAR PERF WALL MOTION  2011   no ischemia  . RADIOFREQUENCY ABLATION     AVNRT - successful  . SMALL INTESTINE SURGERY    . TOTAL HIP ARTHROPLASTY Right 12/27/2014   Procedure: TOTAL HIP ARTHROPLASTY ANTERIOR APPROACH;  Surgeon: Alta Corning, MD;  Location: [facility];  Service: Orthopedics;  Laterality: Right;  . TRIGGER FINGER RELEASE  2012  . US ECHOCARDIOGRAPHY  05/09/2010   mild MR,mild mitral  and aortic sclerosis  . VAGINAL HYSTERECTOMY    . VENTRAL HERNIA REPAIR       OB History   No obstetric history on file.     Family History  Problem Relation Age of Onset  . Diabetes Brother   . Asthma Brother   . Depression Brother   . Asthma Brother   . Depression Brother   . Diabetes Brother   . Cancer Father        skin  . Alcohol abuse Father   . Arthritis Father   . Depression Father   . Hypertension Father   . Alzheimer's disease Father   . Diabetes Mother   . Heart failure Mother   . Arthritis Mother   . Heart disease Mother   . Asthma Sister   . Heart attack Sister   . Depression Sister   . Diabetes Sister   . Drug abuse Sister   . Heart disease Sister   . Mental illness Sister   . Heart attack Son   . Hypertension Son   . Kidney disease Son        transplant 2003  . Vision loss Son     Social History   Tobacco Use  . Smoking status: Former Smoker    Packs/day: 2.00    Quit date: 12/18/1987    Years since quitting: 32.8  . Smokeless tobacco: Never Used  . Tobacco comment: Pt unsure how many years she smoked total.  Substance Use Topics  . Alcohol use: No  . Drug use: No    Home Medications Prior to Admission medications   Medication Sig Start Date End Date Taking? Authorizing Provider  acetaminophen (TYLENOL) 500 MG tablet Take 2 tablets (1,000 mg total) by mouth every 8 (eight) hours as needed for mild pain. 09/04/17   Elodia Florence., MD  aspirin 81 MG tablet Take 81 mg by mouth daily.    [provider]  blood glucose meter kit and supplies KIT Dispense based on patient and insurance preference.  Check blood sugar twice daily as directed. 12/24/16   Orlena Sheldon, PA-C  buPROPion (WELLBUTRIN XL) 150 MG 24 hr tablet Take 150 mg by mouth daily.    [provider]  CALCIUM CITRATE PO Take 1,500 mg by mouth 3 (three) times daily. Patient not taking: Reported on 10/09/2020    [provider]  Cyanocobalamin (VITAMIN  B 12 PO) Take 2,500 mg by mouth daily. Patient not taking: Reported on 10/09/2020    [provider]  diclofenac sodium (VOLTAREN) 1 % GEL Apply 2 g topically 4 (four) times daily as needed. Patient not taking: Reported on 10/09/2020    [provider]  DULoxetine (CYMBALTA) 60 MG capsule Take 60 mg by mouth daily.    [provider]  glipiZIDE (GLUCOTROL) 10 MG tablet Take 10 mg by mouth daily before breakfast.    [provider]  GLIPIZIDE XL 10 MG 24 hr tablet TAKE ONE TABLET BY MOUTH ONCE DAILY WITH  BREAKFAST Patient not taking: Reported on 10/09/2020 04/02/17   Dena Billet B, PA-C  glucose blood test strip 1 each by Other route 2 (two) times daily. Dispense based on Insurance and patient preference.  Check blood sugars twice daily as instructed. 12/24/16   Dixon, Stanton Kidney B, PA-C  IRON, FERROUS SULFATE, PO Take 2 tablets by mouth at bedtime.    [provider]  metFORMIN (GLUCOPHAGE) 500 MG tablet TAKE ONE TABLET BY MOUTH TWICE DAILY WITH MEALS Patient taking differently: Take 500 mg by mouth 2 (two) times daily with a meal.  05/23/17   Dixon, Lonie Peak, PA-C  Multiple Vitamins-Minerals (MULTIVITAMIN WITH MINERALS) tablet Take 1 tablet by mouth daily.    [provider]  nitroGLYCERIN (NITROLINGUAL) 0.4 MG/SPRAY spray USE ONE TO TWO SPRAYS UNDER THE TONGUE EVERY 5 MINUTES AS NEEDED FOR CHEST PAIN 09/06/14   Croitoru, Mihai, MD  omeprazole (PRILOSEC) 40 MG capsule Take 40 mg by mouth daily.    [provider]  pantoprazole (PROTONIX) 40 MG tablet Take 40 mg by mouth daily. Patient not taking: Reported on 10/09/2020    [provider]  QUEtiapine (SEROQUEL) 25 MG tablet Take 12.5-25 mg by mouth at bedtime. 12.5 mg in the morning and 25 mg at bedtime    [provider]  vitamin A 10000 UNIT capsule Take 10,000 Units by mouth daily. Patient not taking: Reported on 10/09/2020    [provider]  Vitamin D,  Ergocalciferol, (DRISDOL) 50000 UNITS CAPS capsule Take 1 capsule (50,000 Units total) by mouth every 7 (seven) days. Patient not taking: Reported on 10/09/2020 10/27/15   Dena Billet B, PA-C  vitamin E (VITAMIN E) 180 MG (400 UNITS) capsule Take 400 Units by mouth daily.    [provider]    Allergies    Penicillins, Latex, Metoclopramide hcl, and Neurontin [gabapentin]  Review of Systems   Review of Systems  Unable to perform ROS: Dementia    Physical Exam Updated Vital Signs BP 105/72 (BP Location: Left Arm)   Pulse 90   Temp 97.9 F (36.6 C) (Oral)   Resp 16   SpO2 100%   Physical Exam Vitals and nursing note reviewed.  Constitutional:      Appearance: Normal appearance. She is well-developed.  HENT:     Head: Normocephalic and atraumatic.     Comments: No tenderness to palpation of skull. No deformities or crepitus noted. No open wounds, abrasions or lacerations.  Eyes:     General: Lids are normal.     Conjunctiva/sclera: Conjunctivae normal.     Pupils: Pupils are equal, round, and reactive to light.     Comments: PERRL. EOMs intact. No nystagmus. No neglect.   Neck:     Comments: C collar in place  Cardiovascular:     Rate and Rhythm: Normal rate and regular rhythm.     Pulses: Normal pulses.     Heart sounds: Normal heart sounds. No murmur heard.  No friction rub. No gallop.   Pulmonary:     Effort: Pulmonary effort is normal.     Breath sounds: Normal breath sounds.     Comments: Lungs clear to auscultation bilaterally.  Symmetric chest rise.  No wheezing, rales, rhonchi. Abdominal:     Palpations: Abdomen is soft.  Abdomen is not rigid.     Tenderness: There is no abdominal tenderness. There is no guarding.     Comments: Abdomen is soft, non-distended, non-tender. No rigidity, No guarding. No peritoneal signs.  Musculoskeletal:        General: Normal range of motion.     Comments: No midline T or L-spine tenderness.  No deformity or crepitus.   Full range of motion of bilateral upper and lower extremities without difficulty.  Skin:    General: Skin is warm and dry.     Capillary Refill: Capillary refill takes less than 2 seconds.     Comments: No obvious wounds, lacerations.  Neurological:     Mental Status: She is alert.     Comments: Alert and oriented x2.  Follows commands, moves all extremities. 5/5 strength of BUE and BLE  Psychiatric:        Speech: Speech normal.     ED Results / Procedures / Treatments   Labs (all labs ordered are listed, but only abnormal results are displayed) Labs Reviewed - No data to display  EKG None  Radiology CT Head Wo Contrast  Result Date: 10/15/2020 CLINICAL DATA:  Unwitnessed fall EXAM: CT HEAD WITHOUT CONTRAST TECHNIQUE: Contiguous axial images were obtained from the base of the skull through the vertex without intravenous contrast. COMPARISON:  09/01/2017 FINDINGS: Brain: Old right frontal infarct. There is atrophy and chronic small vessel disease changes. No acute intracranial abnormality. Specifically, no hemorrhage, hydrocephalus, mass lesion, acute infarction, or significant intracranial injury. Vascular: No hyperdense vessel or unexpected calcification. Skull: No acute calvarial abnormality. Sinuses/Orbits: No acute findings Other: None IMPRESSION: Old right frontal infarct. Atrophy, chronic microvascular disease. No acute intracranial abnormality. Electronically Signed   By: Rolm Baptise M.D.   On: 10/15/2020 19:05   CT Cervical Spine Wo Contrast  Result Date: 10/15/2020 CLINICAL DATA:  Unwitnessed fall EXAM: CT CERVICAL SPINE WITHOUT CONTRAST TECHNIQUE: Multidetector CT imaging of the cervical spine was performed without intravenous contrast. Multiplanar CT image reconstructions were also generated. COMPARISON:  None. FINDINGS: Alignment: No subluxation. Skull base and vertebrae: No acute fracture. No primary bone lesion or focal pathologic process. Soft tissues and spinal canal:  No prevertebral fluid or swelling. No visible canal hematoma. Disc levels: Disc spaces are maintained. Diffuse degenerative facet disease bilaterally. Upper chest: No acute findings Other: None IMPRESSION: No acute bony abnormality. Electronically Signed   By: Rolm Baptise M.D.   On: 10/15/2020 19:07    Procedures Procedures (including critical care time)  Medications Ordered in ED Medications - No data to display  ED Course  I have reviewed the triage vital signs and the nursing notes.  Pertinent labs & imaging results that were available during my care of the patient were reviewed by me and considered in my medical decision making (see chart for details).    MDM Rules/Calculators/A&P                          69 year old female who presents for evaluation after mechanical fall.  Patient is a resident [facility] where she is a resident at the memory care unit.  Patient with history of dementia and is normally not alert and oriented x3.  On initial arrival, patient is afebrile, nontoxic-appearing.  Vital signs are stable.  Plan for imaging of head and neck.  Patient told triage nurse that she was complaining of pain all over.  I am able to talk to patient  when I talked to her, I palpate her abdomen, legs, arms, back without any signs of distress.  I discussed with Consulting civil engineer at [employer]).  She states that patient was with her tech aide and states that she was bending down to pick something up and thrown in the trash.  The tachycardia turned away for a second and when that happened, patient fell.  They do report that she hit her head.  No LOC.  They do report that she is at baseline.  CT head and neck negative for any acute abnormalities.  At this time, patient exhibits no emergent life-threatening condition that require further evaluation in ED. Discussed patient with Dr. Johnney Killian who is agreeable. Patient had ample opportunity for questions and discussion. All patient's  questions were answered with full understanding. Strict return precautions discussed. Patient expresses understanding and agreement to plan.   Portions of this note were generated with [employer]. Dictation errors may occur despite best attempts at proofreading.   Final Clinical Impression(s) / ED Diagnoses Final diagnoses:  Fall, initial encounter    Rx / DC Orders ED Discharge Orders    None       Desma Mcgregor 10/15/20 2042    Charlesetta Shanks, MD 10/15/20 2233

## 2020-10-15 NOTE — ED Triage Notes (Signed)
Pt BIB EMS. Pt had an unwittnessed fall at nursing home [facility]). Pt has right posterior hematoma. No blood thinners. Pt complaining of pain all over. Per EMS pt has on C-collar for precaution.   BP-134/108 HR-88 CBG-150 RR-16 O2-99%

## 2020-10-15 NOTE — ED Provider Notes (Signed)
Medical screening examination/treatment/procedure(s) were conducted as a shared visit with non-physician practitioner(s) and myself.  I personally evaluated the patient during the encounter.    Patient had unwitnessed fall.  She was bent over trying to throw sitting in a trash can.  She did not have loss of consciousness.  She fell striking her head.  Patient denies headache at this time.  She reports she previously had headache but is now resolved.  No other complaints at this time.  Patient is alert and nontoxic.  No facial abrasions or contusions.  Heart regular no gross rub murmur gallop.  Lungs grossly clear.  No extremity deformities.  Patient spontaneously moving all 4 extremities.  Agree with plan of management.   Charlesetta Shanks, MD 10/15/20 2037

## 2020-10-15 NOTE — ED Notes (Signed)
[facility] contacted for discharge back to [facility]

## 2020-10-17 ENCOUNTER — Emergency Department (HOSPITAL_COMMUNITY): Payer: Medicare Other

## 2020-10-17 ENCOUNTER — Inpatient Hospital Stay (HOSPITAL_COMMUNITY)
Admission: EM | Admit: 2020-10-17 | Discharge: 2020-10-20 | DRG: 071 | Disposition: A | Discharge status: Nursing Home (Includes ICF) | Payer: Medicare Other | Attending: Internal Medicine | Admitting: Internal Medicine

## 2020-10-17 ENCOUNTER — Encounter (HOSPITAL_COMMUNITY): Payer: Self-pay

## 2020-10-17 ENCOUNTER — Other Ambulatory Visit: Payer: Self-pay

## 2020-10-17 DIAGNOSIS — K219 Gastro-esophageal reflux disease without esophagitis: Secondary | ICD-10-CM | POA: Diagnosis present

## 2020-10-17 DIAGNOSIS — Z8673 Personal history of transient ischemic attack (TIA), and cerebral infarction without residual deficits: Secondary | ICD-10-CM | POA: Diagnosis not present

## 2020-10-17 DIAGNOSIS — F32A Depression, unspecified: Secondary | ICD-10-CM | POA: Diagnosis present

## 2020-10-17 DIAGNOSIS — J449 Chronic obstructive pulmonary disease, unspecified: Secondary | ICD-10-CM | POA: Diagnosis present

## 2020-10-17 DIAGNOSIS — I259 Chronic ischemic heart disease, unspecified: Secondary | ICD-10-CM | POA: Diagnosis present

## 2020-10-17 DIAGNOSIS — N390 Urinary tract infection, site not specified: Secondary | ICD-10-CM | POA: Diagnosis present

## 2020-10-17 DIAGNOSIS — I5032 Chronic diastolic (congestive) heart failure: Secondary | ICD-10-CM | POA: Diagnosis present

## 2020-10-17 DIAGNOSIS — Z888 Allergy status to other drugs, medicaments and biological substances status: Secondary | ICD-10-CM

## 2020-10-17 DIAGNOSIS — R4182 Altered mental status, unspecified: Secondary | ICD-10-CM

## 2020-10-17 DIAGNOSIS — Z818 Family history of other mental and behavioral disorders: Secondary | ICD-10-CM

## 2020-10-17 DIAGNOSIS — Z9181 History of falling: Secondary | ICD-10-CM | POA: Diagnosis not present

## 2020-10-17 DIAGNOSIS — E569 Vitamin deficiency, unspecified: Secondary | ICD-10-CM | POA: Diagnosis present

## 2020-10-17 DIAGNOSIS — E1165 Type 2 diabetes mellitus with hyperglycemia: Secondary | ICD-10-CM | POA: Diagnosis not present

## 2020-10-17 DIAGNOSIS — Z7982 Long term (current) use of aspirin: Secondary | ICD-10-CM

## 2020-10-17 DIAGNOSIS — E876 Hypokalemia: Secondary | ICD-10-CM | POA: Diagnosis present

## 2020-10-17 DIAGNOSIS — E162 Hypoglycemia, unspecified: Secondary | ICD-10-CM | POA: Diagnosis not present

## 2020-10-17 DIAGNOSIS — Z833 Family history of diabetes mellitus: Secondary | ICD-10-CM

## 2020-10-17 DIAGNOSIS — Z20822 Contact with and (suspected) exposure to covid-19: Secondary | ICD-10-CM | POA: Diagnosis present

## 2020-10-17 DIAGNOSIS — S065X9A Traumatic subdural hemorrhage with loss of consciousness of unspecified duration, initial encounter: Secondary | ICD-10-CM

## 2020-10-17 DIAGNOSIS — Z8261 Family history of arthritis: Secondary | ICD-10-CM

## 2020-10-17 DIAGNOSIS — Z7984 Long term (current) use of oral hypoglycemic drugs: Secondary | ICD-10-CM

## 2020-10-17 DIAGNOSIS — R600 Localized edema: Secondary | ICD-10-CM

## 2020-10-17 DIAGNOSIS — F321 Major depressive disorder, single episode, moderate: Secondary | ICD-10-CM

## 2020-10-17 DIAGNOSIS — Z87440 Personal history of urinary (tract) infections: Secondary | ICD-10-CM | POA: Diagnosis not present

## 2020-10-17 DIAGNOSIS — I2510 Atherosclerotic heart disease of native coronary artery without angina pectoris: Secondary | ICD-10-CM | POA: Diagnosis present

## 2020-10-17 DIAGNOSIS — E785 Hyperlipidemia, unspecified: Secondary | ICD-10-CM | POA: Diagnosis present

## 2020-10-17 DIAGNOSIS — I110 Hypertensive heart disease with heart failure: Secondary | ICD-10-CM | POA: Diagnosis present

## 2020-10-17 DIAGNOSIS — E119 Type 2 diabetes mellitus without complications: Secondary | ICD-10-CM

## 2020-10-17 DIAGNOSIS — E872 Acidosis, unspecified: Secondary | ICD-10-CM

## 2020-10-17 DIAGNOSIS — E871 Hypo-osmolality and hyponatremia: Secondary | ICD-10-CM | POA: Diagnosis present

## 2020-10-17 DIAGNOSIS — F0391 Unspecified dementia with behavioral disturbance: Secondary | ICD-10-CM | POA: Diagnosis present

## 2020-10-17 DIAGNOSIS — Z880 Allergy status to penicillin: Secondary | ICD-10-CM

## 2020-10-17 DIAGNOSIS — Z87891 Personal history of nicotine dependence: Secondary | ICD-10-CM

## 2020-10-17 DIAGNOSIS — H409 Unspecified glaucoma: Secondary | ICD-10-CM | POA: Diagnosis present

## 2020-10-17 DIAGNOSIS — Z9884 Bariatric surgery status: Secondary | ICD-10-CM | POA: Diagnosis not present

## 2020-10-17 DIAGNOSIS — Z825 Family history of asthma and other chronic lower respiratory diseases: Secondary | ICD-10-CM

## 2020-10-17 DIAGNOSIS — E11319 Type 2 diabetes mellitus with unspecified diabetic retinopathy without macular edema: Secondary | ICD-10-CM | POA: Diagnosis present

## 2020-10-17 DIAGNOSIS — N179 Acute kidney failure, unspecified: Secondary | ICD-10-CM | POA: Diagnosis present

## 2020-10-17 DIAGNOSIS — G453 Amaurosis fugax: Secondary | ICD-10-CM

## 2020-10-17 DIAGNOSIS — D638 Anemia in other chronic diseases classified elsewhere: Secondary | ICD-10-CM | POA: Diagnosis present

## 2020-10-17 DIAGNOSIS — Z8249 Family history of ischemic heart disease and other diseases of the circulatory system: Secondary | ICD-10-CM

## 2020-10-17 DIAGNOSIS — G9341 Metabolic encephalopathy: Principal | ICD-10-CM | POA: Diagnosis present

## 2020-10-17 DIAGNOSIS — E11649 Type 2 diabetes mellitus with hypoglycemia without coma: Secondary | ICD-10-CM

## 2020-10-17 DIAGNOSIS — I471 Supraventricular tachycardia: Secondary | ICD-10-CM | POA: Diagnosis present

## 2020-10-17 DIAGNOSIS — Z820 Family history of epilepsy and other diseases of the nervous system: Secondary | ICD-10-CM

## 2020-10-17 DIAGNOSIS — Z79899 Other long term (current) drug therapy: Secondary | ICD-10-CM

## 2020-10-17 DIAGNOSIS — M810 Age-related osteoporosis without current pathological fracture: Secondary | ICD-10-CM | POA: Diagnosis present

## 2020-10-17 DIAGNOSIS — S065XAA Traumatic subdural hemorrhage with loss of consciousness status unknown, initial encounter: Secondary | ICD-10-CM

## 2020-10-17 DIAGNOSIS — Z96641 Presence of right artificial hip joint: Secondary | ICD-10-CM | POA: Diagnosis present

## 2020-10-17 DIAGNOSIS — Z91040 Latex allergy status: Secondary | ICD-10-CM

## 2020-10-17 DIAGNOSIS — Z951 Presence of aortocoronary bypass graft: Secondary | ICD-10-CM

## 2020-10-17 LAB — CBC WITH DIFFERENTIAL/PLATELET
Abs Immature Granulocytes: 0.05 10*3/uL (ref 0.00–0.07)
Basophils Absolute: 0 10*3/uL (ref 0.0–0.1)
Basophils Relative: 0 %
Eosinophils Absolute: 0 10*3/uL (ref 0.0–0.5)
Eosinophils Relative: 0 %
HCT: 28.9 % — ABNORMAL LOW (ref 36.0–46.0)
Hemoglobin: 9.6 g/dL — ABNORMAL LOW (ref 12.0–15.0)
Immature Granulocytes: 1 %
Lymphocytes Relative: 16 %
Lymphs Abs: 1.3 10*3/uL (ref 0.7–4.0)
MCH: 32.9 pg (ref 26.0–34.0)
MCHC: 33.2 g/dL (ref 30.0–36.0)
MCV: 99 fL (ref 80.0–100.0)
Monocytes Absolute: 0.6 10*3/uL (ref 0.1–1.0)
Monocytes Relative: 7 %
Neutro Abs: 6.3 10*3/uL (ref 1.7–7.7)
Neutrophils Relative %: 76 %
Platelets: 274 10*3/uL (ref 150–400)
RBC: 2.92 MIL/uL — ABNORMAL LOW (ref 3.87–5.11)
RDW: 13.5 % (ref 11.5–15.5)
WBC: 8.3 10*3/uL (ref 4.0–10.5)
nRBC: 0 % (ref 0.0–0.2)

## 2020-10-17 LAB — URINALYSIS, ROUTINE W REFLEX MICROSCOPIC
Bilirubin Urine: NEGATIVE
Glucose, UA: NEGATIVE mg/dL
Hgb urine dipstick: NEGATIVE
Ketones, ur: NEGATIVE mg/dL
Nitrite: NEGATIVE
Protein, ur: NEGATIVE mg/dL
Specific Gravity, Urine: 1.019 (ref 1.005–1.030)
pH: 5 (ref 5.0–8.0)

## 2020-10-17 LAB — MAGNESIUM: Magnesium: 1.5 mg/dL — ABNORMAL LOW (ref 1.7–2.4)

## 2020-10-17 LAB — COMPREHENSIVE METABOLIC PANEL
ALT: 32 U/L (ref 0–44)
AST: 31 U/L (ref 15–41)
Albumin: 2 g/dL — ABNORMAL LOW (ref 3.5–5.0)
Alkaline Phosphatase: 67 U/L (ref 38–126)
Anion gap: 13 (ref 5–15)
BUN: 26 mg/dL — ABNORMAL HIGH (ref 8–23)
CO2: 23 mmol/L (ref 22–32)
Calcium: 7.6 mg/dL — ABNORMAL LOW (ref 8.9–10.3)
Chloride: 104 mmol/L (ref 98–111)
Creatinine, Ser: 1.1 mg/dL — ABNORMAL HIGH (ref 0.44–1.00)
GFR, Estimated: 54 mL/min — ABNORMAL LOW (ref >60)
Glucose, Bld: 39 mg/dL — CL (ref 70–99)
Potassium: 3.8 mmol/L (ref 3.5–5.1)
Sodium: 140 mmol/L (ref 135–145)
Total Bilirubin: 1.3 mg/dL — ABNORMAL HIGH (ref 0.3–1.2)
Total Protein: 4.4 g/dL — ABNORMAL LOW (ref 6.5–8.1)

## 2020-10-17 LAB — CBC
HCT: 27.4 % — ABNORMAL LOW (ref 36.0–46.0)
Hemoglobin: 9 g/dL — ABNORMAL LOW (ref 12.0–15.0)
MCH: 32.6 pg (ref 26.0–34.0)
MCHC: 32.8 g/dL (ref 30.0–36.0)
MCV: 99.3 fL (ref 80.0–100.0)
Platelets: 245 10*3/uL (ref 150–400)
RBC: 2.76 MIL/uL — ABNORMAL LOW (ref 3.87–5.11)
RDW: 13.6 % (ref 11.5–15.5)
WBC: 9.2 10*3/uL (ref 4.0–10.5)
nRBC: 0 % (ref 0.0–0.2)

## 2020-10-17 LAB — RESPIRATORY PANEL BY RT PCR (FLU A&B, COVID)
Influenza A by PCR: NEGATIVE
Influenza B by PCR: NEGATIVE
SARS Coronavirus 2 by RT PCR: NEGATIVE

## 2020-10-17 LAB — GLUCOSE, CAPILLARY
Glucose-Capillary: 70 mg/dL (ref 70–99)
Glucose-Capillary: 46 mg/dL — ABNORMAL LOW (ref 70–99)
Glucose-Capillary: 113 mg/dL — ABNORMAL HIGH (ref 70–99)

## 2020-10-17 LAB — CBG MONITORING, ED
Glucose-Capillary: 29 mg/dL — CL (ref 70–99)
Glucose-Capillary: 39 mg/dL — CL (ref 70–99)
Glucose-Capillary: 114 mg/dL — ABNORMAL HIGH (ref 70–99)
Glucose-Capillary: 87 mg/dL (ref 70–99)
Glucose-Capillary: 97 mg/dL (ref 70–99)
Glucose-Capillary: 89 mg/dL (ref 70–99)

## 2020-10-17 LAB — LACTIC ACID, PLASMA: Lactic Acid, Venous: 2.7 mmol/L (ref 0.5–1.9)

## 2020-10-17 LAB — PHOSPHORUS: Phosphorus: 3.5 mg/dL (ref 2.5–4.6)

## 2020-10-17 LAB — HIV ANTIBODY (ROUTINE TESTING W REFLEX): HIV Screen 4th Generation wRfx: NONREACTIVE

## 2020-10-17 LAB — CREATININE, SERUM
Creatinine, Ser: 1.12 mg/dL — ABNORMAL HIGH (ref 0.44–1.00)
GFR, Estimated: 53 mL/min — ABNORMAL LOW (ref >60)

## 2020-10-17 LAB — AMMONIA: Ammonia: 16 umol/L (ref 9–35)

## 2020-10-17 LAB — BRAIN NATRIURETIC PEPTIDE: B Natriuretic Peptide: 86.2 pg/mL (ref 0.0–100.0)

## 2020-10-17 MED ORDER — DEXTROSE-NACL 5-0.45 % IV SOLN: INTRAVENOUS | Status: AC

## 2020-10-17 MED ORDER — DEXTROSE 50 % IV SOLN: 1.0000 | Freq: Once | INTRAVENOUS | Status: AC

## 2020-10-17 MED ORDER — SODIUM CHLORIDE 0.9 % IV SOLN
1.0000 g | INTRAVENOUS | Status: DC
  Administered 2020-10-17 – 2020-10-18 (×2): 1 g via INTRAVENOUS
  Filled 2020-10-17: qty 10
  Filled 2020-10-17: qty 1
  Filled 2020-10-17: qty 10

## 2020-10-17 MED ORDER — MAGNESIUM SULFATE 2 GM/50ML IV SOLN
2.0000 g | Freq: Once | INTRAVENOUS | Status: AC
  Administered 2020-10-17: 2 g via INTRAVENOUS
  Filled 2020-10-17: qty 50

## 2020-10-17 MED ORDER — PANTOPRAZOLE SODIUM 40 MG PO TBEC: 40.0000 mg | DELAYED_RELEASE_TABLET | Freq: Every day | ORAL | Status: DC

## 2020-10-17 MED ORDER — SODIUM CHLORIDE 0.9% FLUSH
3.0000 mL | Freq: Two times a day (BID) | INTRAVENOUS | Status: DC
  Administered 2020-10-17 – 2020-10-19 (×4): 3 mL via INTRAVENOUS

## 2020-10-17 MED ORDER — ENOXAPARIN SODIUM 40 MG/0.4ML ~~LOC~~ SOLN
40.0000 mg | Freq: Every day | SUBCUTANEOUS | Status: DC
  Administered 2020-10-17 – 2020-10-20 (×4): 40 mg via SUBCUTANEOUS
  Filled 2020-10-17 (×4): qty 0.4

## 2020-10-17 MED ORDER — PROSIGHT PO TABS
1.0000 | ORAL_TABLET | Freq: Every day | ORAL | Status: DC
  Administered 2020-10-18 – 2020-10-20 (×3): 1 via ORAL
  Filled 2020-10-17 (×3): qty 1

## 2020-10-17 MED ORDER — ASPIRIN 81 MG PO CHEW
81.0000 mg | CHEWABLE_TABLET | Freq: Every day | ORAL | Status: DC
  Administered 2020-10-18 – 2020-10-20 (×3): 81 mg via ORAL
  Filled 2020-10-17 (×3): qty 1

## 2020-10-17 MED ORDER — ACETAMINOPHEN 650 MG RE SUPP: 650.0000 mg | Freq: Four times a day (QID) | RECTAL | Status: DC | PRN

## 2020-10-17 MED ORDER — DEXTROSE 50 % IV SOLN
INTRAVENOUS | Status: AC
  Administered 2020-10-17: 50 mL via INTRAVENOUS
  Filled 2020-10-17: qty 50

## 2020-10-17 MED ORDER — ACETAMINOPHEN 325 MG PO TABS: 650.0000 mg | ORAL_TABLET | Freq: Four times a day (QID) | ORAL | Status: DC | PRN

## 2020-10-17 MED ORDER — DEXTROSE 50 % IV SOLN
1.0000 | Freq: Once | INTRAVENOUS | Status: AC
  Filled 2020-10-17: qty 50

## 2020-10-17 MED ORDER — DEXTROSE 50 % IV SOLN
INTRAVENOUS | Status: AC
  Administered 2020-10-17: 50 mL
  Filled 2020-10-17: qty 50

## 2020-10-17 NOTE — ED Notes (Signed)
CRITICAL VALUE ALERT  Critical Value:  Lactic acid 2.7 glucose 39  Date & Time Notied:  10/17/2020 1310  Provider Notified: Verneita Griffes and Mickel Baas, [place]  Orders Received/Actions taken: new orders

## 2020-10-17 NOTE — ED Notes (Signed)
Glory Rosebush, RN to give report, nurse in a patients room giving meds and will call me back.

## 2020-10-17 NOTE — ED Provider Notes (Signed)
[facility] DEPT Provider Note   CSN: [id number] Arrival date & time: 10/17/20  1054     History Chief Complaint  Patient presents with  . Altered Mental Status    [name] is a 69 y.o. female.  69 year old female brought in by EMS from [facility] for change in mental status.  Patient states that she feels fine and does not know why she was brought in today.  Patient has a history of dementia and is a poor historian.  Call to patient's brother [name] who states [name] called him today and told him that she was more sleepy than usual.  Brother states that patient's sister visited the other day and noticed that her legs were more swollen than usual, history of CHF with concern for this today.  Patient did have a fall 2 days ago, unsure if related to her mental status change.  Brother states patient is normally talkative although when family visited the other day she did seem more sleepy.        Past Medical History:  Diagnosis Date  . Allergy   . Anxiety   . Arthritis   . Asthma   . 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 Bilateral carotid artery occlusion 09/25/2013  . CAD s/p CABG 1994 09/25/2013   LIMA-LAD, SVG Diag, SVG-RCA, [place] Cath 2010: Stents in the proximal LAD with 40% in-stent restenosis and 50% post-stent stenosis, 80% small OM 1, 70% small OM 2, 40% first PLA; diffuse 70% distal RCA; patent SVG to diagonal, patent SVG to distal RCA, patent atretic LIMA EF 55%; normal nuclear stress test 2011  . Cataract   . CHF (congestive heart failure) ([place])   . Chronic anemia   . Depression   . Diabetes mellitus without complication ([place])   . Diastolic dysfunction   . GERD (gastroesophageal reflux disease)   . Glaucoma   . Heart murmur   . History of recurrent UTIs   . Hyperlipidemia   . Hypertension   . Hypocalcemia   . Ischemic heart disease   . Migraine   . Morbid obesity ([place])    s/p gastric bypass  .  Osteoporosis   . Peroneal neuropathy   . Retinopathy   . Stroke [facility])     Patient Active Problem List   Diagnosis Date Noted  . Rhabdomyolysis 09/01/2017  . Hypokalemia 09/01/2017  . Coffee ground emesis 09/01/2017  . SIRS (systemic inflammatory response syndrome) ([place]) 09/01/2017  . Acute lower UTI 09/01/2017  . AKI (acute kidney injury) ([place]) 09/01/2017  . Chronic pain 09/01/2017  . Hematemesis   . 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 Diabetes mellitus type 2, uncontrolled, without complications 13/24/4010  . Amaurosis fugax of right eye 10/26/2015  . Depression 02/28/2015  . Bilateral leg edema, asymmetrical (R>L) 02/20/2015  . SVT (supraventricular tachycardia) ([place]) 12/31/2014  . Diabetes ([place]) 12/30/2014  . Primary osteoarthritis of right hip 12/27/2014  . Angina decubitus ([place]) 11/08/2014  . Hip joint pain 11/08/2014  . Chronic diastolic heart failure ([place]) 11/08/2014  . Acute on chronic diastolic heart failure ([place]) 09/23/2014  . Protein-calorie malnutrition, severe ([place]) 03/31/2014  . Hypocalcemia 03/30/2014  . Diarrhea 03/30/2014  . Bronchitis, acute 03/30/2014  . CAD s/p CABG 1994 09/25/2013  . Bilateral carotid artery occlusion 09/25/2013  . 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 H/O gastric bypass 09/25/2013  .  Acute encephalopathy 09/25/2013  . History of Diastolic heart failure 63/12/6008  . Iron deficiency anemia 05/28/2012    Past Surgical History:  Procedure Laterality Date  . APPENDECTOMY    . CARDIAC CATHETERIZATION  03/30/2009   3 vessel CAD,patent grafts  . CHOLECYSTECTOMY    . CORONARY ARTERY BYPASS GRAFT  1994   LIM to LAD,SVG to first diagonal,SVG to distal RCA  . GASTRIC BYPASS  2002  . HEEL SPUR EXCISION  2012  . HERNIA REPAIR    . NM MYOCAR PERF WALL MOTION  2011   no ischemia  . RADIOFREQUENCY ABLATION     AVNRT - successful    . SMALL INTESTINE SURGERY    . TOTAL HIP ARTHROPLASTY Right 12/27/2014   Procedure: TOTAL HIP ARTHROPLASTY ANTERIOR APPROACH;  Surgeon: Alta Corning, MD;  Location: [facility];  Service: Orthopedics;  Laterality: Right;  . TRIGGER FINGER RELEASE  2012  . US ECHOCARDIOGRAPHY  05/09/2010   mild MR,mild mitral and aortic sclerosis  . VAGINAL HYSTERECTOMY    . VENTRAL HERNIA REPAIR       OB History   No obstetric history on file.     Family History  Problem Relation Age of Onset  . Diabetes Brother   . Asthma Brother   . Depression Brother   . Asthma Brother   . Depression Brother   . Diabetes Brother   . Cancer Father        skin  . Alcohol abuse Father   . Arthritis Father   . Depression Father   . Hypertension Father   . Alzheimer's disease Father   . Diabetes Mother   . Heart failure Mother   . Arthritis Mother   . Heart disease Mother   . Asthma Sister   . Heart attack Sister   . Depression Sister   . Diabetes Sister   . Drug abuse Sister   . Heart disease Sister   . Mental illness Sister   . Heart attack Son   . Hypertension Son   . Kidney disease Son        transplant 2003  . Vision loss Son     Social History   Tobacco Use  . Smoking status: Former Smoker    Packs/day: 2.00    Quit date: 12/18/1987    Years since quitting: 32.8  . Smokeless tobacco: Never Used  . Tobacco comment: Pt unsure how many years she smoked total.  Substance Use Topics  . Alcohol use: No  . Drug use: No    Home Medications Prior to Admission medications   Medication Sig Start Date End Date Taking? Authorizing Provider  acetaminophen (TYLENOL) 500 MG tablet Take 2 tablets (1,000 mg total) by mouth every 8 (eight) hours as needed for mild pain. 09/04/17   Elodia Florence., MD  aspirin 81 MG tablet Take 81 mg by mouth daily.    [provider]  blood glucose meter kit and supplies KIT Dispense based on patient and insurance preference.  Check blood sugar twice daily  as directed. 12/24/16   Orlena Sheldon, PA-C  buPROPion (WELLBUTRIN XL) 150 MG 24 hr tablet Take 150 mg by mouth daily.    [provider]  CALCIUM CITRATE PO Take 1,500 mg by mouth 3 (three) times daily. Patient not taking: Reported on 10/09/2020    [provider]  Cyanocobalamin (VITAMIN B 12 PO) Take 2,500 mg by mouth daily. Patient not taking: Reported on 10/09/2020  [provider]  diclofenac sodium (VOLTAREN) 1 % GEL Apply 2 g topically 4 (four) times daily as needed. Patient not taking: Reported on 10/09/2020    [provider]  DULoxetine (CYMBALTA) 60 MG capsule Take 60 mg by mouth daily.    [provider]  glipiZIDE (GLUCOTROL) 10 MG tablet Take 10 mg by mouth daily before breakfast.    [provider]  GLIPIZIDE XL 10 MG 24 hr tablet TAKE ONE TABLET BY MOUTH ONCE DAILY WITH  BREAKFAST Patient not taking: Reported on 10/09/2020 04/02/17   Dena Billet B, PA-C  glucose blood test strip 1 each by Other route 2 (two) times daily. Dispense based on Insurance and patient preference.  Check blood sugars twice daily as instructed. 12/24/16   Dixon, Stanton Kidney B, PA-C  IRON, FERROUS SULFATE, PO Take 2 tablets by mouth at bedtime.    [provider]  metFORMIN (GLUCOPHAGE) 500 MG tablet TAKE ONE TABLET BY MOUTH TWICE DAILY WITH MEALS Patient taking differently: Take 500 mg by mouth 2 (two) times daily with a meal.  05/23/17   Dixon, Lonie Peak, PA-C  Multiple Vitamins-Minerals (MULTIVITAMIN WITH MINERALS) tablet Take 1 tablet by mouth daily.    [provider]  nitroGLYCERIN (NITROLINGUAL) 0.4 MG/SPRAY spray USE ONE TO TWO SPRAYS UNDER THE TONGUE EVERY 5 MINUTES AS NEEDED FOR CHEST PAIN 09/06/14   Croitoru, Mihai, MD  omeprazole (PRILOSEC) 40 MG capsule Take 40 mg by mouth daily.    [provider]  pantoprazole (PROTONIX) 40 MG tablet Take 40 mg by mouth daily. Patient not taking: Reported on 10/09/2020    [provider]  QUEtiapine (SEROQUEL) 25 MG tablet Take 12.5-25 mg by mouth at bedtime. 12.5 mg in the morning and 25 mg at bedtime    [provider]  vitamin A 10000 UNIT capsule Take 10,000 Units by mouth daily. Patient not taking: Reported on 10/09/2020    [provider]  Vitamin D, Ergocalciferol, (DRISDOL) 50000 UNITS CAPS capsule Take 1 capsule (50,000 Units total) by mouth every 7 (seven) days. Patient not taking: Reported on 10/09/2020 10/27/15   Dena Billet B, PA-C  vitamin E (VITAMIN E) 180 MG (400 UNITS) capsule Take 400 Units by mouth daily.    [provider]    Allergies    Penicillins, Latex, Metoclopramide hcl, and Neurontin [gabapentin]  Review of Systems   Review of Systems  Unable to perform ROS: Dementia    Physical Exam Updated Vital Signs BP (!) 111/49   Pulse (!) 47   Temp 97.6 F (36.4 C) (Oral)   Resp 18   SpO2 100%   Physical Exam Vitals and nursing note reviewed.  Constitutional:      General: She is not in acute distress.    Appearance: She is well-developed. She is not diaphoretic.  HENT:     Head: Normocephalic and atraumatic.  Eyes:     Extraocular Movements: Extraocular movements intact.     Pupils: Pupils are equal, round, and reactive to light.  Cardiovascular:     Rate and Rhythm: Normal rate. Rhythm irregular.     Pulses: Normal pulses.  Pulmonary:     Effort: Tachypnea present.     Breath sounds: Normal breath sounds. No wheezing.  Abdominal:     Palpations: Abdomen is soft.     Tenderness: There is no abdominal tenderness.  Musculoskeletal:     Right lower leg: Edema present.     Left lower leg: Edema  present.     Comments: Mild pitting edema to bilateral lower legs.  Skin:    General: Skin is warm and dry.     Findings: No erythema or rash.     Comments: Bandage to left forearm from recent skin tear. Ecchymosis to left hand/thumb, no pain with palpation.   Neurological:     General: No focal deficit present.      Mental Status: She is alert.  Psychiatric:        Behavior: Behavior normal.     ED Results / Procedures / Treatments   Labs (all labs ordered are listed, but only abnormal results are displayed) Labs Reviewed  CBC WITH DIFFERENTIAL/PLATELET - Abnormal; Notable for the following components:      Result Value   RBC 2.92 (*)    Hemoglobin 9.6 (*)    HCT 28.9 (*)    All other components within normal limits  COMPREHENSIVE METABOLIC PANEL - Abnormal; Notable for the following components:   Glucose, Bld 39 (*)    BUN 26 (*)    Creatinine, Ser 1.10 (*)    Calcium 7.6 (*)    Total Protein 4.4 (*)    Albumin 2.0 (*)    Total Bilirubin 1.3 (*)    GFR, Estimated 54 (*)    All other components within normal limits  LACTIC ACID, PLASMA - Abnormal; Notable for the following components:   Lactic Acid, Venous 2.7 (*)    All other components within normal limits  CBG MONITORING, ED - Abnormal; Notable for the following components:   Glucose-Capillary 29 (*)    All other components within normal limits  CBG MONITORING, ED - Abnormal; Notable for the following components:   Glucose-Capillary 39 (*)    All other components within normal limits  CBG MONITORING, ED - Abnormal; Notable for the following components:   Glucose-Capillary 114 (*)    All other components within normal limits  RESPIRATORY PANEL BY RT PCR (FLU A&B, COVID)  CULTURE, BLOOD (ROUTINE X 2)  CULTURE, BLOOD (ROUTINE X 2)  AMMONIA  BRAIN NATRIURETIC PEPTIDE  URINALYSIS, ROUTINE W REFLEX MICROSCOPIC    EKG None  Radiology CT Head Wo Contrast  Result Date: 10/17/2020 CLINICAL DATA:  Mental status change.  Recent fall. EXAM: CT HEAD WITHOUT CONTRAST TECHNIQUE: Contiguous axial images were obtained from the base of the skull through the vertex without intravenous contrast. COMPARISON:  10/15/2020. FINDINGS: Brain: Interval development of a low-density, approximately 7 mm thick subdural fluid collection along the left  cerebral convexity. Mild associated mass effect with approximately 3 mm of rightward midline shift. Remote right frontal infarct. Similar generalized cerebral atrophy and chronic microvascular ischemic change. No hydrocephalus. No mass lesion. No evidence of acute hemorrhage. No evidence of acute large vascular territory infarct. Vascular: Calcific atherosclerosis. Skull: No acute fracture. Sinuses/Orbits: Mucosal thickening and opacification of anterior right ethmoid air cells. Unremarkable orbits. Other: No mastoid effusions. IMPRESSION: Interval development of a low-density, approximately 7 mm thick subdural fluid collection along the left cerebral convexity. Mild associated mass effect with 3 mm of rightward midline shift. Electronically Signed   By: Feliberto Harts MD   On: 10/17/2020 13:11   CT Head Wo Contrast  Result Date: 10/15/2020 CLINICAL DATA:  Unwitnessed fall EXAM: CT HEAD WITHOUT CONTRAST TECHNIQUE: Contiguous axial images were obtained from the base of the skull through the vertex without intravenous contrast. COMPARISON:  09/01/2017 FINDINGS: Brain: Old right frontal infarct. There is atrophy and chronic small vessel disease  changes. No acute intracranial abnormality. Specifically, no hemorrhage, hydrocephalus, mass lesion, acute infarction, or significant intracranial injury. Vascular: No hyperdense vessel or unexpected calcification. Skull: No acute calvarial abnormality. Sinuses/Orbits: No acute findings Other: None IMPRESSION: Old right frontal infarct. Atrophy, chronic microvascular disease. No acute intracranial abnormality. Electronically Signed   By: Rolm Baptise M.D.   On: 10/15/2020 19:05   CT Cervical Spine Wo Contrast  Result Date: 10/15/2020 CLINICAL DATA:  Unwitnessed fall EXAM: CT CERVICAL SPINE WITHOUT CONTRAST TECHNIQUE: Multidetector CT imaging of the cervical spine was performed without intravenous contrast. Multiplanar CT image reconstructions were also generated.  COMPARISON:  None. FINDINGS: Alignment: No subluxation. Skull base and vertebrae: No acute fracture. No primary bone lesion or focal pathologic process. Soft tissues and spinal canal: No prevertebral fluid or swelling. No visible canal hematoma. Disc levels: Disc spaces are maintained. Diffuse degenerative facet disease bilaterally. Upper chest: No acute findings Other: None IMPRESSION: No acute bony abnormality. Electronically Signed   By: Rolm Baptise M.D.   On: 10/15/2020 19:07   DG Chest Port 1 View  Result Date: 10/17/2020 CLINICAL DATA:  Shortness of breath. EXAM: PORTABLE CHEST 1 VIEW COMPARISON:  September 01, 2017. FINDINGS: The heart size and mediastinal contours are within normal limits. No pneumothorax or pleural effusion is noted. Status post coronary bypass graft. Both lungs are clear. The visualized skeletal structures are unremarkable. IMPRESSION: No active disease. Electronically Signed   By: Marijo Conception M.D.   On: 10/17/2020 12:45    Procedures .Critical Care Performed by: Tacy Learn, PA-C Authorized by: Tacy Learn, PA-C   Critical care provider statement:    Critical care time (minutes):  45   Critical care was time spent personally by me on the following activities:  Discussions with consultants, evaluation of patient's response to treatment, examination of patient, ordering and performing treatments and interventions, ordering and review of laboratory studies, ordering and review of radiographic studies, pulse oximetry, re-evaluation of patient's condition, obtaining history from patient or surrogate and review of old charts   (including critical care time)  Medications Ordered in ED Medications  dextrose 50 % solution 50 mL (50 mLs Intravenous Given 10/17/20 1314)    ED Course  I have reviewed the triage vital signs and the nursing notes.  Pertinent labs & imaging results that were available during my care of the patient were reviewed by me and considered  in my medical decision making (see chart for details).  Clinical Course as of Oct 17 1405  Mon Oct 17, 6176  7638 69 year old female brought in by EMS from nursing home for altered mental status. Patient is a poor historian but denies any complaints today. On exam, patient was on [facility], per nursing staff had room air O2 sat of 85%. [place] was removed and patient maintained O2 sat of 100% on room air. Patient was mildly tachyepnic, lungs clear, mild pitting edema to bilateral lower extremities. Patient is able to follow simple commands and moves all extremities equally.    [LM]  1334 CBG of 29, patient is alert, able to drink orange juice with sugar added. Repeat CBG 39, D50 ordered with monitoring. Patient is on Metformin and Glipizide.  Lactic acid returns at 2.7, no obvious infectious source, history of CHF, will hold on fluids. CBC and CMP without significant changes from prior with exception of her hypoglycemia.  Ammonia WNL. BNP 86. CXR unremarkable.  CT head shows interval subdural hematoma with 41mm midline shift.  Patient was seen in the ER 2 days ago after a fall. CT head at that time without acute findings.   Case discussed with Dr. Tomi Bamberger, ER attending, neurosurgery paged for consult.    [LM]  4431 Case discussed with Dr. Zada Finders with neurosurgery, CT reviewed, no intervention needed at this time.  Hospitalist paged for consult for admission for hypoglycemia.   [LM]  1402 Case discussed with Dr. Neysa Bonito with [facility] who will consult for admission.   [LM]    Clinical Course User Index [LM] Roque Lias   MDM Rules/Calculators/A&P                          Final Clinical Impression(s) / ED Diagnoses Final diagnoses:  Altered mental status, unspecified altered mental status type  Hypoglycemia  Lactic acidosis  Subdural hematoma [facility])    Rx / DC Orders ED Discharge Orders    None       Tacy Learn, PA-C 10/17/20 1406    Dorie Rank, MD 10/18/20  [phone]

## 2020-10-17 NOTE — ED Notes (Signed)
Patient came in with oxygen sats in upper 80s on arrival and put on 2L [place]. Oxygen saturation 100% on 2L [place].

## 2020-10-17 NOTE — H&P (Signed)
History and Physical        Hospital Admission Note Date: 10/17/2020  Patient name: [name] Medical record number: [id number] Date of birth: [birth year] Age: 69 y.o. Gender: female  PCP: Roetta Sessions, NP  Patient coming from: [facility]   Chief Complaint    Chief Complaint  Patient presents with  . Altered Mental Status      HPI:   This is a 69 year old female with a past medical history of dementia, diabetes, recurrent UTIs, hypertension, hyperlipidemia, AVNRT s/p ablation, bariatric surgery, CHF, CVA, recent ED visit post fall brought in by EMS from [place] with change in mental status.  After discussion with mother over the phone today he states that the patient does seem more lethargic than her usual self and is typically much more alert but has been seeming to decline lately.  He reported a history of bariatric surgery several years ago as well.  Patient currently denies any complaints and is pleasantly confused.  ED Course: Afebrile, tachycardic tolerating room air.  Notable labs: POC glucose 29, CMP glucose 39, BUN 26, creatinine 1.1 (previous 0.6 in 2018), albumin 2.0, T bili 1.3, lactic acid 2.7, Hb 9.6 at baseline, UA positive for UTI.  She was given a sugary snack without much improvement in sugar followed by 1 amp D50.  Vitals:   10/17/20 1701 10/17/20 1809  BP: (!) 111/46 (!) 109/55  Pulse: 99 71  Resp: (!) 23 20  Temp:  (!) 97.5 F (36.4 C)  SpO2: 98% 98%     Review of Systems:  Review of Systems  Constitutional: Negative for chills and fever.  Respiratory: Negative for shortness of breath and wheezing.   Cardiovascular: Negative for chest pain and palpitations.  Genitourinary: Negative.   Musculoskeletal: Negative.   All other systems reviewed and are negative.   Medical/Social/Family History   Past Medical History: Past Medical  History:  Diagnosis Date  . Allergy   . Anxiety   . Arthritis   . Asthma   . 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 Bilateral carotid artery occlusion 09/25/2013  . CAD s/p CABG 1994 09/25/2013   LIMA-LAD, SVG Diag, SVG-RCA, [facility] 2010: Stents in the proximal LAD with 40% in-stent restenosis and 50% post-stent stenosis, 80% small OM 1, 70% small OM 2, 40% first PLA; diffuse 70% distal RCA; patent SVG to diagonal, patent SVG to distal RCA, patent atretic LIMA EF 55%; normal nuclear stress test 2011  . Cataract   . CHF (congestive heart failure) ([place])   . Chronic anemia   . Depression   . Diabetes mellitus without complication ([place])   . Diastolic dysfunction   . GERD (gastroesophageal reflux disease)   . Glaucoma   . Heart murmur   . History of recurrent UTIs   . Hyperlipidemia   . Hypertension   . Hypocalcemia   . Ischemic heart disease   . Migraine   . Morbid obesity ([place])    s/p gastric bypass  . Osteoporosis   . Peroneal neuropathy   . Retinopathy   . Stroke [facility])     Past Surgical History:  Procedure Laterality Date  . APPENDECTOMY    . CARDIAC CATHETERIZATION  03/30/2009   3 vessel CAD,patent grafts  . CHOLECYSTECTOMY    . CORONARY ARTERY BYPASS GRAFT  1994   LIM to LAD,SVG to first diagonal,SVG to distal RCA  . GASTRIC BYPASS  2002  . HEEL SPUR EXCISION  2012  . HERNIA REPAIR    . NM MYOCAR PERF WALL MOTION  2011   no ischemia  . RADIOFREQUENCY ABLATION     AVNRT - successful  . SMALL INTESTINE SURGERY    . TOTAL HIP ARTHROPLASTY Right 12/27/2014   Procedure: TOTAL HIP ARTHROPLASTY ANTERIOR APPROACH;  Surgeon: Alta Corning, MD;  Location: [facility];  Service: Orthopedics;  Laterality: Right;  . TRIGGER FINGER RELEASE  2012  . US ECHOCARDIOGRAPHY  05/09/2010   mild MR,mild mitral and aortic sclerosis  . VAGINAL HYSTERECTOMY    . VENTRAL HERNIA REPAIR      Medications: Prior to Admission medications   Medication Sig Start Date End Date  Taking? Authorizing Provider  acetaminophen (TYLENOL) 500 MG tablet Take 2 tablets (1,000 mg total) by mouth every 8 (eight) hours as needed for mild pain. Patient taking differently: Take 500 mg by mouth every 6 (six) hours as needed for mild pain.  09/04/17  Yes Elodia Florence., MD  aspirin 81 MG tablet Take 81 mg by mouth daily.   Yes [provider]  buPROPion (WELLBUTRIN XL) 150 MG 24 hr tablet Take 150 mg by mouth daily.   Yes [provider]  Calcium Citrate-Vitamin D (CALCIUM CITRATE MAXIMUM/VIT D PO) Take 1 tablet by mouth at bedtime. Give 1 tablet by mouth   Yes [provider]  DULoxetine (CYMBALTA) 60 MG capsule Take 60 mg by mouth daily.   Yes [provider]  glipiZIDE (GLUCOTROL) 10 MG tablet Take 10 mg by mouth daily before breakfast.   Yes [provider]  Iron, Ferrous Sulfate, 325 (65 Fe) MG TABS Take 325 mg by mouth daily.    Yes [provider]  metFORMIN (GLUCOPHAGE) 500 MG tablet TAKE ONE TABLET BY MOUTH TWICE DAILY WITH MEALS Patient taking differently: Take 500 mg by mouth 2 (two) times daily with a meal.  05/23/17  Yes Dixon, Mary B, PA-C  Multiple Vitamins-Minerals (MULTIVITAMIN WITH MINERALS) tablet Take 1 tablet by mouth daily.   Yes [provider]  omeprazole (PRILOSEC) 40 MG capsule Take 40 mg by mouth daily.   Yes [provider]  QUEtiapine (SEROQUEL) 25 MG tablet Take 12.5-25 mg by mouth at bedtime. 12.5 mg in the morning and 25 mg at bedtime   Yes [provider]  Skin Protectants, Misc. (BAZA PROTECT EX) Apply 1 application topically. Apply to buttock topically three times a day for skin protection   Yes [provider]  vitamin E (VITAMIN E) 180 MG (400 UNITS) capsule Take 400 Units by mouth daily.   Yes [provider]  blood glucose meter kit and supplies KIT Dispense based on patient and insurance preference.  Check blood sugar twice daily as directed. 12/24/16    Orlena Sheldon, PA-C  CALCIUM CITRATE PO Take 1,500 mg by mouth 3 (three) times daily. Patient not taking: Reported on 10/09/2020    [provider]  Cyanocobalamin (VITAMIN B 12 PO) Take 2,500 mg by mouth daily. Patient not taking: Reported on 10/09/2020    [provider]  diclofenac sodium (VOLTAREN) 1 % GEL Apply 2 g topically 4 (four) times daily as needed. Patient not taking: Reported on 10/09/2020  [provider]  GLIPIZIDE XL 10 MG 24 hr tablet TAKE ONE TABLET BY MOUTH ONCE DAILY WITH  BREAKFAST Patient not taking: Reported on 10/09/2020 04/02/17   Dena Billet B, PA-C  glucose blood test strip 1 each by Other route 2 (two) times daily. Dispense based on Insurance and patient preference.  Check blood sugars twice daily as instructed. 12/24/16   Dixon, Stanton Kidney B, PA-C  nitroGLYCERIN (NITROLINGUAL) 0.4 MG/SPRAY spray USE ONE TO TWO SPRAYS UNDER THE TONGUE EVERY 5 MINUTES AS NEEDED FOR CHEST PAIN Patient not taking: Reported on 10/17/2020 09/06/14   Croitoru, Mihai, MD  vitamin A 10000 UNIT capsule Take 10,000 Units by mouth daily. Patient not taking: Reported on 10/09/2020    [provider]  Vitamin D, Ergocalciferol, (DRISDOL) 50000 UNITS CAPS capsule Take 1 capsule (50,000 Units total) by mouth every 7 (seven) days. Patient not taking: Reported on 10/09/2020 10/27/15   Orlena Sheldon, PA-C    Allergies:   Allergies  Allergen Reactions  . Penicillins Rash    ++ tolerates cefepime++ Has patient had a PCN reaction causing immediate rash, facial/tongue/throat swelling, SOB or lightheadedness with hypotension: Unknown Has patient had a PCN reaction causing severe rash involving mucus membranes or skin necrosis: Unknown Has patient had a PCN reaction that required hospitalization: No Has patient had a PCN reaction occurring within the last 10 years: No If all of the above answers are "NO", then may proceed with Cephalosporin use.   . Latex   .  Metoclopramide Hcl   . Neurontin [Gabapentin]     Social History:  reports that she quit smoking about 32 years ago. She smoked 2.00 packs per day. She has never used smokeless tobacco. She reports that she does not drink alcohol and does not use drugs.  Family History: Family History  Problem Relation Age of Onset  . Diabetes Brother   . Asthma Brother   . Depression Brother   . Asthma Brother   . Depression Brother   . Diabetes Brother   . Cancer Father        skin  . Alcohol abuse Father   . Arthritis Father   . Depression Father   . Hypertension Father   . Alzheimer's disease Father   . Diabetes Mother   . Heart failure Mother   . Arthritis Mother   . Heart disease Mother   . Asthma Sister   . Heart attack Sister   . Depression Sister   . Diabetes Sister   . Drug abuse Sister   . Heart disease Sister   . Mental illness Sister   . Heart attack Son   . Hypertension Son   . Kidney disease Son        transplant 2003  . Vision loss Son      Objective   Physical Exam: Blood pressure (!) 109/55, pulse 71, temperature (!) 97.5 F (36.4 C), temperature source Oral, resp. rate 20, SpO2 98 %.  Physical Exam Vitals and nursing note reviewed.  Constitutional:      Appearance: Normal appearance.  HENT:     Head: Normocephalic and atraumatic.  Eyes:     Conjunctiva/sclera: Conjunctivae normal.  Cardiovascular:     Rate and Rhythm: Normal rate and regular rhythm.  Pulmonary:     Effort: Pulmonary effort is normal.     Breath sounds: Normal breath sounds.  Abdominal:     General: Abdomen is flat.     Palpations: Abdomen is soft.  Musculoskeletal:        General: No swelling or tenderness.  Skin:    Coloration: Skin is not jaundiced or pale.  Neurological:     Mental Status: She is alert. She is disoriented.     Comments: Oriented to place  Psychiatric:        Mood and Affect: Mood normal.        Behavior: Behavior normal.     LABS on Admission: I have  personally reviewed all the labs and imaging below    Basic Metabolic Panel: Recent Labs  Lab 10/17/20 1147 10/17/20 1529  NA 140  --   K 3.8  --   CL 104  --   CO2 23  --   GLUCOSE 39*  --   BUN 26*  --   CREATININE 1.10* 1.12*  CALCIUM 7.6*  --   MG  --  1.5*   Liver Function Tests: Recent Labs  Lab 10/17/20 1147  AST 31  ALT 32  ALKPHOS 67  BILITOT 1.3*  PROT 4.4*  ALBUMIN 2.0*   No results for input(s): LIPASE, AMYLASE in the last 168 hours. Recent Labs  Lab 10/17/20 1147  AMMONIA 16   CBC: Recent Labs  Lab 10/17/20 1147 10/17/20 1147 10/17/20 1529  WBC 8.3  --  9.2  NEUTROABS 6.3  --   --   HGB 9.6*  --  9.0*  HCT 28.9*  --  27.4*  MCV 99.0   < > 99.3  PLT 274  --  245   < > = values in this interval not displayed.   Cardiac Enzymes: No results for input(s): CKTOTAL, CKMB, CKMBINDEX, TROPONINI in the last 168 hours. BNP: Invalid input(s): POCBNP CBG: Recent Labs  Lab 10/17/20 1621 10/17/20 1726  GLUCAP 87 89    Radiological Exams on Admission:  CT Head Wo Contrast  Result Date: 10/17/2020 CLINICAL DATA:  Mental status change.  Recent fall. EXAM: CT HEAD WITHOUT CONTRAST TECHNIQUE: Contiguous axial images were obtained from the base of the skull through the vertex without intravenous contrast. COMPARISON:  10/15/2020. FINDINGS: Brain: Interval development of a low-density, approximately 7 mm thick subdural fluid collection along the left cerebral convexity. Mild associated mass effect with approximately 3 mm of rightward midline shift. Remote right frontal infarct. Similar generalized cerebral atrophy and chronic microvascular ischemic change. No hydrocephalus. No mass lesion. No evidence of acute hemorrhage. No evidence of acute large vascular territory infarct. Vascular: Calcific atherosclerosis. Skull: No acute fracture. Sinuses/Orbits: Mucosal thickening and opacification of anterior right ethmoid air cells. Unremarkable orbits. Other: No mastoid  effusions. IMPRESSION: Interval development of a low-density, approximately 7 mm thick subdural fluid collection along the left cerebral convexity. Mild associated mass effect with 3 mm of rightward midline shift. Electronically Signed   By: Margaretha Sheffield MD   On: 10/17/2020 13:11   CT Head Wo Contrast  Result Date: 10/15/2020 CLINICAL DATA:  Unwitnessed fall EXAM: CT HEAD WITHOUT CONTRAST TECHNIQUE: Contiguous axial images were obtained from the base of the skull through the vertex without intravenous contrast. COMPARISON:  09/01/2017 FINDINGS: Brain: Old right frontal infarct. There is atrophy and chronic small vessel disease changes. No acute intracranial abnormality. Specifically, no hemorrhage, hydrocephalus, mass lesion, acute infarction, or significant intracranial injury. Vascular: No hyperdense vessel or unexpected calcification. Skull: No acute calvarial abnormality. Sinuses/Orbits: No acute findings Other: None IMPRESSION: Old right frontal infarct. Atrophy, chronic microvascular disease. No acute intracranial abnormality. Electronically Signed   By: Lennette Bihari  [place] M.D.   On: 10/15/2020 19:05   CT Cervical Spine Wo Contrast  Result Date: 10/15/2020 CLINICAL DATA:  Unwitnessed fall EXAM: CT CERVICAL SPINE WITHOUT CONTRAST TECHNIQUE: Multidetector CT imaging of the cervical spine was performed without intravenous contrast. Multiplanar CT image reconstructions were also generated. COMPARISON:  None. FINDINGS: Alignment: No subluxation. Skull base and vertebrae: No acute fracture. No primary bone lesion or focal pathologic process. Soft tissues and spinal canal: No prevertebral fluid or swelling. No visible canal hematoma. Disc levels: Disc spaces are maintained. Diffuse degenerative facet disease bilaterally. Upper chest: No acute findings Other: None IMPRESSION: No acute bony abnormality. Electronically Signed   By: Rolm Baptise M.D.   On: 10/15/2020 19:07   DG Chest Port 1 View  Result  Date: 10/17/2020 CLINICAL DATA:  Shortness of breath. EXAM: PORTABLE CHEST 1 VIEW COMPARISON:  September 01, 2017. FINDINGS: The heart size and mediastinal contours are within normal limits. No pneumothorax or pleural effusion is noted. Status post coronary bypass graft. Both lungs are clear. The visualized skeletal structures are unremarkable. IMPRESSION: No active disease. Electronically Signed   By: Marijo Conception M.D.   On: 10/17/2020 12:45      EKG: Independently reviewed.  QTc 525 ms   A & P   Principal Problem:   Acute metabolic encephalopathy Active Problems:   Diabetes (HCC)   Depression   Acute lower UTI   AKI (acute kidney injury) ([place])   Hypomagnesemia   1. Acute metabolic encephalopathy, multifactorial: Hypoglycemia, AKI, UTI, possible vitamin deficiency (bariatric patient) in a dementia patient a. Currently oriented to place and very sleepy, typically much more alert per brother b. Check vitamin levels c. Follow-up blood cultures d. Treat underlying conditions as below e. Pt eval  2. Type 2 diabetes with Hypoglycemia a. POC glucose 29, confirmed on CMP resolved with 1 amp D50 b. Hold glipizide c. Q1h POC glucose d. Encourage p.o. intake  3. UTI a. Ceftriaxone  4. AKI a. Fluids as above  5. Prolonged QTc a. On multiple QT prolonging agents at home - hold b. Replete electrolytes  6. Depression a. Holding home meds  7. Hypomagnesemia a. Replete   DVT prophylaxis: Lovenox   Code Status: Full Code  Diet: Regular Family Communication: Admission, patients condition and plan of care including tests being ordered have been discussed with the patient who indicates understanding and agrees with the plan and Code Status. Patient's brother was updated  Disposition Plan: The appropriate patient status for this patient is INPATIENT. Inpatient status is judged to be reasonable and necessary in order to provide the required intensity of service to ensure the  patient's safety. The patient's presenting symptoms, physical exam findings, and initial radiographic and laboratory data in the context of their chronic comorbidities is felt to place them at high risk for further clinical deterioration. Furthermore, it is not anticipated that the patient will be medically stable for discharge from the hospital within 2 midnights of admission. The following factors support the patient status of inpatient.   " The patient's presenting symptoms include change in mental status. " The worrisome physical exam findings include disoriented. " The initial radiographic and laboratory data are worrisome because of multiple lab abnormalities including hyperglycemia, hypomagnesemia, AKI. " The chronic co-morbidities include dementia, UTIs.   * I certify that at the point of admission it is my clinical judgment that the patient will require inpatient hospital care spanning beyond 2 midnights from the point of admission due to  high intensity of service, high risk for further deterioration and high frequency of surveillance required.*   Status is: Inpatient  Remains inpatient appropriate because:Persistent severe electrolyte disturbances, Altered mental status and Unsafe d/c plan   Dispo: The patient is from: ALF              Anticipated d/c is to: ALF              Anticipated d/c date is: 3 days              Patient currently is not medically stable to d/c.       Consultants  . none  Procedures  . none  Time Spent on Admission: 66 minutes    Harold Hedge, DO [facility]  10/17/2020, 6:34 PM

## 2020-10-17 NOTE — ED Triage Notes (Addendum)
Patient BIB [facility] from [facility] with AMS and weakness. Per facility patient is not acting herself. Patient has a recent fall and was seen here for that.  Has a ST right arm FA and left upper arm.  Does not appear septic per EMS.  Patient is pale.   EMS vitals 92/60, came up to 111/68 without anything given 92-RR 82 CBG 94% RA 18-RR

## 2020-10-17 NOTE — Progress Notes (Signed)
Patient arrives to room 1407 at this time via stretcher from ED.

## 2020-10-18 ENCOUNTER — Other Ambulatory Visit: Payer: Self-pay

## 2020-10-18 DIAGNOSIS — I471 Supraventricular tachycardia: Secondary | ICD-10-CM

## 2020-10-18 DIAGNOSIS — I5032 Chronic diastolic (congestive) heart failure: Secondary | ICD-10-CM

## 2020-10-18 DIAGNOSIS — R4182 Altered mental status, unspecified: Secondary | ICD-10-CM

## 2020-10-18 DIAGNOSIS — J449 Chronic obstructive pulmonary disease, unspecified: Secondary | ICD-10-CM

## 2020-10-18 LAB — BASIC METABOLIC PANEL
Anion gap: 10 (ref 5–15)
BUN: 19 mg/dL (ref 8–23)
CO2: 23 mmol/L (ref 22–32)
Calcium: 7.3 mg/dL — ABNORMAL LOW (ref 8.9–10.3)
Chloride: 101 mmol/L (ref 98–111)
Creatinine, Ser: 0.82 mg/dL (ref 0.44–1.00)
GFR, Estimated: >60 mL/min (ref >60)
Glucose, Bld: 78 mg/dL (ref 70–99)
Potassium: 3.4 mmol/L — ABNORMAL LOW (ref 3.5–5.1)
Sodium: 134 mmol/L — ABNORMAL LOW (ref 135–145)

## 2020-10-18 LAB — CBC
HCT: 26.3 % — ABNORMAL LOW (ref 36.0–46.0)
Hemoglobin: 9.2 g/dL — ABNORMAL LOW (ref 12.0–15.0)
MCH: 33.3 pg (ref 26.0–34.0)
MCHC: 35 g/dL (ref 30.0–36.0)
MCV: 95.3 fL (ref 80.0–100.0)
Platelets: 263 10*3/uL (ref 150–400)
RBC: 2.76 MIL/uL — ABNORMAL LOW (ref 3.87–5.11)
RDW: 13.5 % (ref 11.5–15.5)
WBC: 7.9 10*3/uL (ref 4.0–10.5)
nRBC: 0 % (ref 0.0–0.2)

## 2020-10-18 LAB — GLUCOSE, CAPILLARY
Glucose-Capillary: 101 mg/dL — ABNORMAL HIGH (ref 70–99)
Glucose-Capillary: 102 mg/dL — ABNORMAL HIGH (ref 70–99)
Glucose-Capillary: 129 mg/dL — ABNORMAL HIGH (ref 70–99)
Glucose-Capillary: 142 mg/dL — ABNORMAL HIGH (ref 70–99)
Glucose-Capillary: 72 mg/dL (ref 70–99)
Glucose-Capillary: 76 mg/dL (ref 70–99)
Glucose-Capillary: 81 mg/dL (ref 70–99)
Glucose-Capillary: 84 mg/dL (ref 70–99)

## 2020-10-18 LAB — VITAMIN D 25 HYDROXY (VIT D DEFICIENCY, FRACTURES): Vit D, 25-Hydroxy: 17.36 ng/mL — ABNORMAL LOW (ref 30–100)

## 2020-10-18 LAB — VITAMIN B12: Vitamin B-12: 1241 pg/mL — ABNORMAL HIGH (ref 180–914)

## 2020-10-18 LAB — MAGNESIUM: Magnesium: 1.9 mg/dL (ref 1.7–2.4)

## 2020-10-18 MED ORDER — BUPROPION HCL ER (XL) 150 MG PO TB24: 150.0000 mg | ORAL_TABLET | Freq: Every day | ORAL | Status: DC

## 2020-10-18 MED ORDER — QUETIAPINE FUMARATE 25 MG PO TABS
12.5000 mg | ORAL_TABLET | Freq: Every day | ORAL | Status: DC
  Administered 2020-10-19: 12.5 mg via ORAL
  Filled 2020-10-18: qty 1

## 2020-10-18 MED ORDER — DULOXETINE HCL 60 MG PO CPEP: 60.0000 mg | ORAL_CAPSULE | Freq: Every day | ORAL | Status: DC

## 2020-10-18 MED ORDER — QUETIAPINE FUMARATE 25 MG PO TABS
25.0000 mg | ORAL_TABLET | Freq: Every day | ORAL | Status: DC
  Administered 2020-10-18: 25 mg via ORAL
  Filled 2020-10-18: qty 1

## 2020-10-18 MED ORDER — POTASSIUM CHLORIDE CRYS ER 20 MEQ PO TBCR
50.0000 meq | EXTENDED_RELEASE_TABLET | Freq: Once | ORAL | Status: DC
  Filled 2020-10-18: qty 1

## 2020-10-18 MED ORDER — QUETIAPINE FUMARATE 25 MG PO TABS
12.5000 mg | ORAL_TABLET | Freq: Every day | ORAL | Status: DC
Start: 2020-10-18 — End: 2020-10-18

## 2020-10-18 MED ORDER — DEXTROSE-NACL 5-0.9 % IV SOLN: INTRAVENOUS | Status: DC

## 2020-10-18 MED ORDER — DULOXETINE HCL 60 MG PO CPEP
60.0000 mg | ORAL_CAPSULE | Freq: Every day | ORAL | Status: DC
  Administered 2020-10-18 – 2020-10-20 (×3): 60 mg via ORAL
  Filled 2020-10-18 (×3): qty 1

## 2020-10-18 MED ORDER — BUPROPION HCL ER (XL) 150 MG PO TB24
150.0000 mg | ORAL_TABLET | Freq: Every day | ORAL | Status: DC
  Administered 2020-10-18 – 2020-10-20 (×3): 150 mg via ORAL
  Filled 2020-10-18 (×3): qty 1

## 2020-10-18 MED ORDER — HALOPERIDOL LACTATE 5 MG/ML IJ SOLN: 0.5000 mg | Freq: Three times a day (TID) | INTRAMUSCULAR | Status: DC | PRN

## 2020-10-18 NOTE — Progress Notes (Signed)
PROGRESS NOTE    [name]  JQB:[id number] DOB: 12-30-1950 DOA: 10/17/2020 PCP: Roetta Sessions, NP     Brief Narrative:  69 year old WF PMHx Dementia, CVA, DM type II uncontrolled without complication, recurrent UTIs, HTN, chronic diastolic CHF,AVNRT s/p ablation, COPD mixed type, HLD,   bariatric surgery,  recent ED visit   post fall brought in by EMS from [facility] with change in mental status.  After discussion with mother over the phone today he states that the patient does seem more lethargic than her usual self and is typically much more alert but has been seeming to decline lately.  He reported a history of bariatric surgery several years ago as well.  Patient currently denies any complaints and is pleasantly confused.  ED Course: Afebrile, tachycardic tolerating room air.  Notable labs: POC glucose 29, CMP glucose 39, BUN 26, creatinine 1.1 (previous 0.6 in 2018), albumin 2.0, T bili 1.3, lactic acid 2.7, Hb 9.6 at baseline, UA positive for UTI.  She was given a sugary snack without much improvement in sugar followed by 1 amp D50.   Subjective: Afebrile overnight A/O x1 (does not know where, when, why) patient having hallucinations.  States sees a man standing in the corner and is yelling out stating the man will not come close to her by the bed.   Assessment & Plan: Covid vaccination;   Principal Problem:   Acute metabolic encephalopathy Active Problems:   AVNRT s/p successful RF ablation   Chronic diastolic heart failure (HCC)   Diabetes ([place])   Depression   COPD mixed type ([place])   Acute lower UTI   AKI (acute kidney injury) ([place])   Hypomagnesemia    Acute metabolic encephalopathy -Patient's baseline per memory care unit is A/O x1 however does not have hallucinations and knows her room number at the facility. -Multifactorial AKI, UTI, worsening dementia, vitamin deficiency in bariatric patient, withdrawal from psychiatric medication. -Fix underlying  problems -11/2 restart Wellbutrin 150 mg daily -11/2 restart Cymbalta 60 mg daily -11/2 restart Seroquel -Haldol PRN (hold) until repeat EKG  DM type II uncontrolled without complication -03/25/7352 hemoglobin A1c= 10.3  -Hemoglobin A1c pending -Lipid panel pending -CBG q 4hr   UTI? -On admission urinalysis with many bacteria -Complete 5-day course antibiotics  AKI? -Previous Cr 09/04/2017= 0.61 -Hydrate and monitor -D5-0.9% saline 75 ml/hr  Prolonged QTC -11/1 QTC> 500 -11/2 repeat EKG  Depression -Restart home medication, see acute metabolic cephalopathy   Hypokalemia -Potassium goal> 4 -11/2 K-Dur 40 mEq  Hypomagnesmia -Magnesium goal> 2     DVT prophylaxis: Lovenox Code Status: Full Family Communication:  Status is: Inpatient    Dispo: The patient is from: Assisted living facility              Anticipated d/c is to: Assisted living facility              Anticipated d/c date is: 11/4              Patient currently unstable      Consultants:    Procedures/Significant Events:    I have personally reviewed and interpreted all radiology studies and my findings are as above.  VENTILATOR SETTINGS:    Cultures 11/1 SARS coronavirus negative 11/1 influenza A/B negative 11/1 HIV screen negative 11/2 urine pending   Antimicrobials: Anti-infectives (From admission, onward)   Start     Ordered Stop   10/17/20 1530  cefTRIAXone (ROCEPHIN) 1 g in sodium chloride 0.9 % 100 mL  IVPB        10/17/20 1501         Devices    LINES / TUBES:      Continuous Infusions: . cefTRIAXone (ROCEPHIN)  IV 1 g (10/18/20 1750)  . dextrose 5 % and 0.9% NaCl       Objective: Vitals:   10/17/20 2222 10/18/20 0215 10/18/20 0548 10/18/20 1318  BP: (!) 141/74 (!) 106/56 123/64 (!) 100/50  Pulse: (!) 57 72 97 (!) 58  Resp: 20 16 16 16   Temp: 97.8 F (36.6 C) 98.4 F (36.9 C) 98.8 F (37.1 C) 98 F (36.7 C)  TempSrc: Oral Oral Oral Oral  SpO2: 99%  99% 98% 92%  Weight:    60.4 kg    Intake/Output Summary (Last 24 hours) at 10/18/2020 1845 Last data filed at 10/18/2020 1100 Gross per 24 hour  Intake 678.69 ml  Output --  Net 678.69 ml   Filed Weights   10/18/20 1318  Weight: 60.4 kg    Examination:  General: A/O x1 (does not know where, when, why), having hallucinations, No acute respiratory distress Eyes: negative scleral hemorrhage, negative anisocoria, negative icterus ENT: Negative Runny nose, negative gingival bleeding, Neck:  Negative scars, masses, torticollis, lymphadenopathy, JVD Lungs: Clear to auscultation bilaterally without wheezes or crackles Cardiovascular: Regular rate and rhythm without murmur gallop or rub normal S1 and S2 Abdomen: negative abdominal pain, nondistended, positive soft, bowel sounds, no rebound, no ascites, no appreciable mass Extremities: No significant cyanosis, clubbing, or edema bilateral lower extremities Skin: Negative rashes, lesions, ulcers Psychiatric: Unable to fully assess secondary to acute cephalopathy. Central nervous system:  Cranial nerves II through XII intact, tongue/uvula midline, all extremities muscle strength 5/5, sensation intact throughout, negative dysarthria, negative expressive aphasia, negative receptive aphasia.  Follows some commands  .     Data Reviewed: Care during the described time interval was provided by me .  I have reviewed this patient's available data, including medical history, events of note, physical examination, and all test results as part of my evaluation.  CBC: Recent Labs  Lab 10/17/20 1147 10/17/20 1529 10/18/20 0500  WBC 8.3 9.2 7.9  NEUTROABS 6.3  --   --   HGB 9.6* 9.0* 9.2*  HCT 28.9* 27.4* 26.3*  MCV 99.0 99.3 95.3  PLT 274 245 517   Basic Metabolic Panel: Recent Labs  Lab 10/17/20 1147 10/17/20 1529 10/18/20 0500 10/18/20 0850  NA 140  --  134*  --   K 3.8  --  3.4*  --   CL 104  --  101  --   CO2 23  --  23  --    GLUCOSE 39*  --  78  --   BUN 26*  --  19  --   CREATININE 1.10* 1.12* 0.82  --   CALCIUM 7.6*  --  7.3*  --   MG  --  1.5*  --  1.9  PHOS  --  3.5  --   --    GFR: CrCl cannot be calculated (Unknown ideal weight.). Liver Function Tests: Recent Labs  Lab 10/17/20 1147  AST 31  ALT 32  ALKPHOS 67  BILITOT 1.3*  PROT 4.4*  ALBUMIN 2.0*   No results for input(s): LIPASE, AMYLASE in the last 168 hours. Recent Labs  Lab 10/17/20 1147  AMMONIA 16   Coagulation Profile: No results for input(s): INR, PROTIME in the last 168 hours. Cardiac Enzymes: No results for input(s): CKTOTAL, CKMB,  CKMBINDEX, TROPONINI in the last 168 hours. BNP (last 3 results) No results for input(s): PROBNP in the last 8760 hours. HbA1C: No results for input(s): HGBA1C in the last 72 hours. CBG: Recent Labs  Lab 10/18/20 0609 10/18/20 0734 10/18/20 0930 10/18/20 1053 10/18/20 1650  GLUCAP 72 76 84 101* 102*   Lipid Profile: No results for input(s): CHOL, HDL, LDLCALC, TRIG, CHOLHDL, LDLDIRECT in the last 72 hours. Thyroid Function Tests: No results for input(s): TSH, T4TOTAL, FREET4, T3FREE, THYROIDAB in the last 72 hours. Anemia Panel: Recent Labs    10/18/20 0500  VITAMINB12 1,241*   Sepsis Labs: Recent Labs  Lab 10/17/20 1147  LATICACIDVEN 2.7*    Recent Results (from the past 240 hour(s))  Respiratory Panel by RT PCR (Flu A&B, Covid) - Nasopharyngeal Swab     Status: None   Collection Time: 10/17/20  1:45 PM   Specimen: Nasopharyngeal Swab  Result Value Ref Range Status   SARS Coronavirus 2 by RT PCR NEGATIVE NEGATIVE Final    Comment: (NOTE) SARS-CoV-2 target nucleic acids are NOT DETECTED.  The SARS-CoV-2 RNA is generally detectable in upper respiratoy specimens during the acute phase of infection. The lowest concentration of SARS-CoV-2 viral copies this assay can detect is 131 copies/mL. A negative result does not preclude SARS-Cov-2 infection and should not be used  as the sole basis for treatment or other patient management decisions. A negative result may occur with  improper specimen collection/handling, submission of specimen other than nasopharyngeal swab, presence of viral mutation(s) within the areas targeted by this assay, and inadequate number of viral copies (<131 copies/mL). A negative result must be combined with clinical observations, patient history, and epidemiological information. The expected result is Negative.  Fact Sheet for Patients:  PinkCheek.be  Fact Sheet for Healthcare Providers:  GravelBags.it  This test is no t yet approved or cleared by the [place] FDA and  has been authorized for detection and/or diagnosis of SARS-CoV-2 by FDA under an Emergency Use Authorization (EUA). This EUA will remain  in effect (meaning this test can be used) for the duration of the COVID-19 declaration under Section 564(b)(1) of the Act, 21 U.S.C. section 360bbb-3(b)(1), unless the authorization is terminated or revoked sooner.     Influenza A by PCR NEGATIVE NEGATIVE Final   Influenza B by PCR NEGATIVE NEGATIVE Final    Comment: (NOTE) The Xpert Xpress SARS-CoV-2/FLU/RSV assay is intended as an aid in  the diagnosis of influenza from Nasopharyngeal swab specimens and  should not be used as a sole basis for treatment. Nasal washings and  aspirates are unacceptable for Xpert Xpress SARS-CoV-2/FLU/RSV  testing.  Fact Sheet for Patients: PinkCheek.be  Fact Sheet for Healthcare Providers: GravelBags.it  This test is not yet approved or cleared by the [place] FDA and  has been authorized for detection and/or diagnosis of SARS-CoV-2 by  FDA under an Emergency Use Authorization (EUA). This EUA will remain  in effect (meaning this test can be used) for the duration of the  Covid-19 declaration under Section  564(b)(1) of the Act, 21  U.S.C. section 360bbb-3(b)(1), unless the authorization is  terminated or revoked. Performed at [facility], [place] [address] [place], [place] [address]   Culture, blood (routine x 2)     Status: None (Preliminary result)   Collection Time: 10/17/20  1:45 PM   Specimen: BLOOD  Result Value Ref Range Status   Specimen Description   Final    BLOOD LEFT ANTECUBITAL  Performed at [facility], [place] [address], [place], [place] [address]    Special Requests   Final    BOTTLES DRAWN AEROBIC AND ANAEROBIC Blood Culture adequate volume Performed at [place] [address] [place], [place] [address]    Culture   Final    NO GROWTH < 12 HOURS Performed at [facility] [address] [place], [place] [address]    Report Status PENDING  Incomplete  Culture, blood (routine x 2)     Status: None (Preliminary result)   Collection Time: 10/17/20  1:46 PM   Specimen: BLOOD LEFT FOREARM  Result Value Ref Range Status   Specimen Description   Final    BLOOD LEFT FOREARM Performed at [facility] [address], [place], [place] [address]    Special Requests   Final    BOTTLES DRAWN AEROBIC AND ANAEROBIC Blood Culture adequate volume Performed at [facility] [address], [place], [place] [address]    Culture   Final    NO GROWTH < 12 HOURS Performed at [place] [address] [place], [place] [address]    Report Status PENDING  Incomplete         Radiology Studies: CT Head Wo Contrast  Result Date: 10/17/2020 CLINICAL DATA:  Mental status change.  Recent fall. EXAM: CT HEAD WITHOUT CONTRAST TECHNIQUE: Contiguous axial images were obtained from the base of the skull through the vertex without intravenous contrast. COMPARISON:  10/15/2020. FINDINGS: Brain: Interval development of a low-density, approximately 7 mm thick subdural fluid  collection along the left cerebral convexity. Mild associated mass effect with approximately 3 mm of rightward midline shift. Remote right frontal infarct. Similar generalized cerebral atrophy and chronic microvascular ischemic change. No hydrocephalus. No mass lesion. No evidence of acute hemorrhage. No evidence of acute large vascular territory infarct. Vascular: Calcific atherosclerosis. Skull: No acute fracture. Sinuses/Orbits: Mucosal thickening and opacification of anterior right ethmoid air cells. Unremarkable orbits. Other: No mastoid effusions. IMPRESSION: Interval development of a low-density, approximately 7 mm thick subdural fluid collection along the left cerebral convexity. Mild associated mass effect with 3 mm of rightward midline shift. Electronically Signed   By: Margaretha Sheffield MD   On: 10/17/2020 13:11   DG Chest Port 1 View  Result Date: 10/17/2020 CLINICAL DATA:  Shortness of breath. EXAM: PORTABLE CHEST 1 VIEW COMPARISON:  September 01, 2017. FINDINGS: The heart size and mediastinal contours are within normal limits. No pneumothorax or pleural effusion is noted. Status post coronary bypass graft. Both lungs are clear. The visualized skeletal structures are unremarkable. IMPRESSION: No active disease. Electronically Signed   By: Marijo Conception M.D.   On: 10/17/2020 12:45        Scheduled Meds: . aspirin  81 mg Oral Daily  . buPROPion  150 mg Oral Daily  . DULoxetine  60 mg Oral Daily  . enoxaparin (LOVENOX) injection  40 mg Subcutaneous Daily  . multivitamin  1 tablet Oral Daily  . potassium chloride  50 mEq Oral Once  . QUEtiapine  12.5 mg Oral Daily  . QUEtiapine  25 mg Oral QHS  . sodium chloride flush  3 mL Intravenous Q12H   Continuous Infusions: . cefTRIAXone (ROCEPHIN)  IV 1 g (10/18/20 1750)  . dextrose 5 % and 0.9% NaCl       LOS: 1 day    Time spent:40 min    Naara Kelty J,  MD [facility] Pager [phone]  If 7PM-7AM, please contact  night-coverage [email or web address] Password [facility] 10/18/2020, 6:45 PM

## 2020-10-18 NOTE — NC FL2 (Signed)
[name] LEVEL OF CARE SCREENING TOOL     IDENTIFICATION  Patient Name: [name] Birthdate: Mar 22, 1951 Sex: female Admission Date (Current Location): 10/17/2020  [facility] and [place] Number:  [name] and Address:  [facility],  [address], [place]      Provider Number: [id number]  Attending Physician Name and Address:  Allie Bossier, MD  Relative Name and Phone Number:  [name] (brother)[phone]    Current Level of Care: Hospital Recommended Level of Care: Memory Care Prior Approval Number:    Date Approved/Denied:   PASRR Number:    Discharge Plan: Other (Comment) (ALF Memory care)    Current Diagnoses: Patient Active Problem List   Diagnosis Date Noted   AMS (altered mental status) 56/25/6389   Acute metabolic encephalopathy 37/34/2876   Hypomagnesemia 10/17/2020   Rhabdomyolysis 09/01/2017   Hypokalemia 09/01/2017   Coffee ground emesis 09/01/2017   SIRS (systemic inflammatory response syndrome) ([place]) 09/01/2017   Acute lower UTI 09/01/2017   AKI (acute kidney injury) ([place]) 09/01/2017   Chronic pain 09/01/2017   Hematemesis    Complex sleep apnea syndrome 08/09/2017   COPD mixed type ([place]) 08/09/2017   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 05/10/2017   Diabetes mellitus type 2, uncontrolled, without complications 81/15/7262   Amaurosis fugax of right eye 10/26/2015   Depression 02/28/2015   Bilateral leg edema, asymmetrical (R>L) 02/20/2015   SVT (supraventricular tachycardia) ([place]) 12/31/2014   Diabetes ([place]) 12/30/2014   Primary osteoarthritis of right hip 12/27/2014   Angina decubitus ([place]) 11/08/2014   Hip joint pain 11/08/2014   Chronic diastolic heart failure ([place]) 11/08/2014   Acute on chronic diastolic heart failure ([place]) 09/23/2014   Protein-calorie malnutrition, severe (HCC) 03/31/2014   Hypocalcemia 03/30/2014   Diarrhea 03/30/2014    Bronchitis, acute 03/30/2014   CAD s/p CABG 1994 09/25/2013   Bilateral carotid artery occlusion 09/25/2013   AVNRT s/p successful RF ablation 09/25/2013   H/O gastric bypass 09/25/2013   Acute encephalopathy 09/25/2013   History of Diastolic heart failure 03/55/9741   Iron deficiency anemia 05/28/2012    Orientation RESPIRATION BLADDER Height & Weight     Self  Normal Incontinent Weight: 60.4 kg Height:     BEHAVIORAL SYMPTOMS/MOOD NEUROLOGICAL BOWEL NUTRITION STATUS      Incontinent Diet (Regular)  AMBULATORY STATUS COMMUNICATION OF NEEDS Skin   Limited Assist Verbally Skin abrasions (R arm skin tears)                       Personal Care Assistance Level of Assistance  Bathing, Dressing Bathing Assistance: Limited assistance   Dressing Assistance: Limited assistance     Functional Limitations Info  Sight, Speech, Hearing Sight Info: Adequate Hearing Info: Adequate Speech Info: Adequate    SPECIAL CARE FACTORS FREQUENCY  PT (By licensed PT), OT (By licensed OT)     PT Frequency: 3x week OT Frequency: 3x week            Contractures Contractures Info: Not present    Additional Factors Info  Code Status, Allergies Code Status Info:  (Full code) Allergies Info:  (Penicillins, Latex, Metoclopramide Hcl, Neurontin Gabapentin)           Current Medications (10/18/2020):  This is the current hospital active medication list Current Facility-Administered Medications  Medication Dose Route Frequency Provider Last Rate Last Admin   acetaminophen (TYLENOL) tablet 650 mg  650 mg Oral Q6H  PRN Harold Hedge, MD       Or   acetaminophen (TYLENOL) suppository 650 mg  650 mg Rectal Q6H PRN Harold Hedge, MD       aspirin chewable tablet 81 mg  81 mg Oral Daily Harold Hedge, MD   81 mg at 10/18/20 0954   cefTRIAXone (ROCEPHIN) 1 g in sodium chloride 0.9 % 100 mL IVPB  1 g Intravenous Q24H Harold Hedge, MD   Stopped at 10/17/20 1608   enoxaparin  (LOVENOX) injection 40 mg  40 mg Subcutaneous Daily Harold Hedge, MD   40 mg at 10/18/20 0956   multivitamin (PROSIGHT) tablet 1 tablet  1 tablet Oral Daily Harold Hedge, MD   1 tablet at 10/18/20 0956   potassium chloride (KLOR-CON) CR tablet 50 mEq  50 mEq Oral Once Allie Bossier, MD       sodium chloride flush (NS) 0.9 % injection 3 mL  3 mL Intravenous Q12H Harold Hedge, MD   3 mL at 10/18/20 1000     Discharge Medications: Please see discharge summary for a list of discharge medications.  Relevant Imaging Results:  Relevant Lab Results:   Additional Information ss#[id number]  [name], Juliann Pulse, [place]

## 2020-10-18 NOTE — TOC Initial Note (Signed)
Transition of Care [facility]) - Initial/Assessment Note    Patient Details  Name: [name] MRN: [id number] Date of Birth: 17-Sep-1951  [facility] CM/SW Contact:    Dessa Phi, RN Phone Number: 10/18/2020, 2:22 PM  Clinical Narrative:spoke to brother, & ALF [facility] memory care rep confirmed patient a+0x1-has rw but doesn't use often, indep w/feeding-knows her rm. Per facility, & brother [name] d/c plan return back to ALF memory care. PT recc SNF.                  Expected Discharge Plan: Assisted Living Barriers to Discharge: Continued Medical Work up   Patient Goals and CMS Choice Patient states their goals for this hospitalization and ongoing recovery are:: per brother [name] return back to ALF CMS Medicare.gov Compare Post Acute Care list provided to:: Patient Represenative (must comment) Choice offered to / list presented to : Sibling  Expected Discharge Plan and Services Expected Discharge Plan: Assisted Living   Discharge Planning Services: CM Consult Post Acute Care Choice: Resumption of Svcs/PTA Provider Living arrangements for the past 2 months: [facility] (memory care)                                      Prior Living Arrangements/Services Living arrangements for the past 2 months: [facility] (memory care) Lives with:: Facility Resident Patient language and need for interpreter reviewed:: Yes Do you feel safe going back to the place where you live?: Yes      Need for Family Participation in Patient Care: No (Comment) Care giver support system in place?: Yes (comment) Current home services: DME (rw) Criminal Activity/Legal Involvement Pertinent to Current Situation/Hospitalization: No - Comment as needed  Activities of Daily Living Home Assistive Devices/Equipment: Other (Comment) (pt unable to answer) ADL Screening (condition at time of admission) Patient's cognitive ability adequate to safely complete daily  activities?: No Is the patient deaf or have difficulty hearing?: Yes Does the patient have difficulty seeing, even when wearing glasses/contacts?: No Does the patient have difficulty concentrating, remembering, or making decisions?: Yes Patient able to express need for assistance with ADLs?: Yes Does the patient have difficulty dressing or bathing?: No Independently performs ADLs?: No Communication: Independent Dressing (OT): Needs assistance Is this a change from baseline?: Pre-admission baseline Grooming: Needs assistance Is this a change from baseline?: Pre-admission baseline Feeding: Needs assistance Is this a change from baseline?: Pre-admission baseline Bathing: Needs assistance Is this a change from baseline?: Pre-admission baseline Toileting: Needs assistance Is this a change from baseline?: Pre-admission baseline In/Out Bed: Needs assistance Is this a change from baseline?: Pre-admission baseline Walks in Home: Needs assistance Is this a change from baseline?: Pre-admission baseline Does the patient have difficulty walking or climbing stairs?: Yes Weakness of Legs: Both Weakness of Arms/Hands: Both  Permission Sought/Granted Permission sought to share information with : Case Manager Permission granted to share information with : Yes, Verbal Permission Granted  Share Information with NAME: Case manager     Permission granted to share info w Relationship: Jeff(brother)[phone]     Emotional Assessment Appearance:: Appears stated age Attitude/Demeanor/Rapport: Gracious Affect (typically observed): Accepting Orientation: : Oriented to Self Alcohol / Substance Use: Not Applicable Psych Involvement: No (comment)  Admission diagnosis:  Lactic acidosis [E87.2] Subdural hematoma ([place]) [S06.5X9A] Hypoglycemia [E16.2] Altered mental status, unspecified altered mental status type [R41.82] AMS (altered mental status) [R41.82] Patient  Active Problem List   Diagnosis  Date Noted  . AMS (altered mental status) 10/17/2020  . Acute metabolic encephalopathy 43/56/8616  . Hypomagnesemia 10/17/2020  . Rhabdomyolysis 09/01/2017  . Hypokalemia 09/01/2017  . Coffee ground emesis 09/01/2017  . SIRS (systemic inflammatory response syndrome) ([place]) 09/01/2017  . Acute lower UTI 09/01/2017  . AKI (acute kidney injury) ([place]) 09/01/2017  . Chronic pain 09/01/2017  . Hematemesis   . 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 COPD mixed type ([place]) 08/09/2017  . 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 Diabetes mellitus type 2, uncontrolled, without complications 83/72/9021  . Amaurosis fugax of right eye 10/26/2015  . Depression 02/28/2015  . Bilateral leg edema, asymmetrical (R>L) 02/20/2015  . SVT (supraventricular tachycardia) ([place]) 12/31/2014  . Diabetes ([place]) 12/30/2014  . Primary osteoarthritis of right hip 12/27/2014  . Angina decubitus ([place]) 11/08/2014  . Hip joint pain 11/08/2014  . Chronic diastolic heart failure ([place]) 11/08/2014  . Acute on chronic diastolic heart failure ([place]) 09/23/2014  . Protein-calorie malnutrition, severe ([place]) 03/31/2014  . Hypocalcemia 03/30/2014  . Diarrhea 03/30/2014  . Bronchitis, acute 03/30/2014  . CAD s/p CABG 1994 09/25/2013  . Bilateral carotid artery occlusion 09/25/2013  . 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 H/O gastric bypass 09/25/2013  . Acute encephalopathy 09/25/2013  . History of Diastolic heart failure 11/55/2080  . Iron deficiency anemia 05/28/2012   PCP:  Roetta Sessions, NP Pharmacy:   [facility] [address] [place] - [address] [place] [place] [address] Phone: [phone] Fax: [phone]     Social Determinants of Health (SDOH) Interventions    Readmission Risk Interventions No flowsheet data found.

## 2020-10-18 NOTE — Evaluation (Signed)
Physical Therapy Evaluation Patient Details Name: [name] MRN: [id number] DOB: [birth year] Today's Date: 10/18/2020   History of Present Illness  69 yo female admitted with acute metabolic encephalopathy, hypoglycemia. CT (+) head hematoma. Fall on 10/30 at ALF. Hx of dementia, falls, DM, back pain, COPD, depression, anxiety  Clinical Impression  On eval, pt required Mod assist for mobility. She walked ~10 feet with a RW. Pt presents with general weakness, decreased activity tolerance, and impaired gait and balance. Ambulation distance was limited by fatigue, weakness. No family present during session. Recommendation is for SNF unless ALF can provide current level of assistance and depending on family decision. Will plan to follow and progress activity as tolerated.     Follow Up Recommendations SNF (unless ALF feels they can provide current level of care)    Equipment Recommendations  None recommended by PT    Recommendations for Other Services       Precautions / Restrictions Precautions Precautions: Fall Restrictions Weight Bearing Restrictions: No      Mobility  Bed Mobility Overal bed mobility: Needs Assistance Bed Mobility: Supine to Sit     Supine to sit: Mod assist;HOB elevated     General bed mobility comments: Pt unable to perform task unassisted, even with increased time and cueing. Assist for trunk and to scoot to EOB. Utilized bedpad to aid with scooting.    Transfers Overall transfer level: Needs assistance Equipment used: Rolling walker (2 wheeled) Transfers: Sit to/from Stand Sit to Stand: Min assist;From elevated surface         General transfer comment: x2. Cues for safety, technique, hand placement. Assist to power up, stabilize. Increased time.  Ambulation/Gait Ambulation/Gait assistance: Min assist Gait Distance (Feet): 10 Feet Assistive device: Rolling walker (2 wheeled) Gait Pattern/deviations: Step-through pattern;Decreased stance  time - left     General Gait Details: Assist to stabilize pt and manage RW. Distance limited by fatigue, weakness. Pt did not feel she could tolerate walking in hallway on today.  Stairs            Wheelchair Mobility    Modified Rankin (Stroke Patients Only)       Balance Overall balance assessment: Needs assistance;History of Falls         Standing balance support: Bilateral upper extremity supported Standing balance-Leahy Scale: Poor                               Pertinent Vitals/Pain Pain Assessment: No/denies pain    Home Living Family/patient expects to be discharged to:: Assisted living ([place])               Home Equipment: Gilford Rile - 2 wheels;Walker - 4 wheels      Prior Function Level of Independence: Needs assistance   Gait / Transfers Assistance Needed: ambulatory possibly without a device-pt stated she doesn't use a walker           Hand Dominance        Extremity/Trunk Assessment   Upper Extremity Assessment Upper Extremity Assessment: Defer to OT evaluation    Lower Extremity Assessment Lower Extremity Assessment: Generalized weakness    Cervical / Trunk Assessment Cervical / Trunk Assessment: Kyphotic  Communication   Communication: No difficulties  Cognition Arousal/Alertness: Awake/alert Behavior During Therapy: WFL for tasks assessed/performed Overall Cognitive Status: History of cognitive impairments - at baseline  General Comments      Exercises     Assessment/Plan    PT Assessment Patient needs continued PT services  PT Problem List Decreased strength;Decreased mobility;Decreased activity tolerance;Decreased balance;Decreased knowledge of use of DME;Decreased cognition       PT Treatment Interventions DME instruction;Gait training;Therapeutic activities;Therapeutic exercise;Patient/family education;Balance training;Functional  mobility training    PT Goals (Current goals can be found in the Care Plan section)  Acute Rehab PT Goals Patient Stated Goal: none stated PT Goal Formulation: Patient unable to participate in goal setting Time For Goal Achievement: 11/01/20 Potential to Achieve Goals: Good    Frequency Min 3X/week   Barriers to discharge        Co-evaluation               AM-PAC PT "6 Clicks" Mobility  Outcome Measure Help needed turning from your back to your side while in a flat bed without using bedrails?: A Little Help needed moving from lying on your back to sitting on the side of a flat bed without using bedrails?: A Lot Help needed moving to and from a bed to a chair (including a wheelchair)?: A Little Help needed standing up from a chair using your arms (e.g., wheelchair or bedside chair)?: A Little Help needed to walk in hospital room?: A Little Help needed climbing 3-5 steps with a railing? : Total 6 Click Score: 15    End of Session Equipment Utilized During Treatment: Gait belt Activity Tolerance: Patient limited by fatigue Patient left: in chair;with call bell/phone within reach;with chair alarm set   PT Visit Diagnosis: History of falling (Z91.81);Muscle weakness (generalized) (M62.81);Difficulty in walking, not elsewhere classified (R26.2)    Time: 0802-2336 PT Time Calculation (min) (ACUTE ONLY): 21 min   Charges:   PT Evaluation $PT Eval Moderate Complexity: 1 Mod            Doreatha Massed, PT Acute Rehabilitation  Office: [phone] Pager: [phone]

## 2020-10-18 NOTE — Progress Notes (Signed)
RN notified MD of patient's continuing trend of hypoglycemia.  RN notified MD of interventions reported in handoff by night RN and recorded in the [facility]. RN also notified MD that orange juice and food has been given by NT and RN to help maintain/elevate patient blood sugar within normal limits.

## 2020-10-18 NOTE — Progress Notes (Signed)
Nutrition Brief Note  Patient identified on the Malnutrition Screening Tool (MST) Report  Wt Readings from Last 15 Encounters:  11/12/17 69.4 kg  09/04/17 62.8 kg  08/16/17 65.1 kg  08/13/17 56.7 kg  08/09/17 64.5 kg  05/21/17 65.3 kg  05/09/17 66.2 kg  04/15/17 65 kg  12/24/16 64.5 kg  08/13/16 67.1 kg  07/02/16 67.6 kg  06/06/16 69.9 kg  05/09/16 66.2 kg  01/26/16 70.4 kg  11/28/15 70 kg    Placed order for patient to be weighed today. All other weights in the chart, including Care Everywhere, are from 2018 except for weight at [facility] on 05/30/20 when she weighed 152 lb.   She is noted to be a/o to self only. Patient was unable to provide any pertinent information. No family/visitors present at bedside.   Current diet order is Regular. No intakes documented since admission yesterday. She was eating breakfast at the time of visit: yogurt, bacon, scrambled eggs, fresh fruit, orange juice, cheesy grits. Labs and medications reviewed.   No nutrition interventions warranted at this time. If nutrition issues arise, please consult RD.       Jarome Matin, MS, RD, LDN, CNSC Inpatient Clinical Dietitian RD pager # available in [place]  After hours/weekend pager # available in [facility]

## 2020-10-19 DIAGNOSIS — F0391 Unspecified dementia with behavioral disturbance: Secondary | ICD-10-CM

## 2020-10-19 LAB — LIPID PANEL
Cholesterol: 61 mg/dL (ref 0–200)
HDL: 13 mg/dL — ABNORMAL LOW (ref >40)
LDL Cholesterol: 34 mg/dL (ref 0–99)
Total CHOL/HDL Ratio: 4.7 RATIO
Triglycerides: 72 mg/dL (ref <150)
VLDL: 14 mg/dL (ref 0–40)

## 2020-10-19 LAB — SARS CORONAVIRUS 2 BY RT PCR (HOSPITAL ORDER, PERFORMED IN ~~LOC~~ HOSPITAL LAB): SARS Coronavirus 2: NEGATIVE

## 2020-10-19 LAB — HEMOGLOBIN A1C
Hgb A1c MFr Bld: 4.8 % (ref 4.8–5.6)
Mean Plasma Glucose: 91.06 mg/dL

## 2020-10-19 LAB — CBC WITH DIFFERENTIAL/PLATELET
Abs Immature Granulocytes: 0.02 10*3/uL (ref 0.00–0.07)
Basophils Absolute: 0 10*3/uL (ref 0.0–0.1)
Basophils Relative: 0 %
Eosinophils Absolute: 0 10*3/uL (ref 0.0–0.5)
Eosinophils Relative: 1 %
HCT: 24 % — ABNORMAL LOW (ref 36.0–46.0)
Hemoglobin: 7.9 g/dL — ABNORMAL LOW (ref 12.0–15.0)
Immature Granulocytes: 0 %
Lymphocytes Relative: 25 %
Lymphs Abs: 1.7 10*3/uL (ref 0.7–4.0)
MCH: 32.2 pg (ref 26.0–34.0)
MCHC: 32.9 g/dL (ref 30.0–36.0)
MCV: 98 fL (ref 80.0–100.0)
Monocytes Absolute: 0.5 10*3/uL (ref 0.1–1.0)
Monocytes Relative: 8 %
Neutro Abs: 4.4 10*3/uL (ref 1.7–7.7)
Neutrophils Relative %: 66 %
Platelets: 208 10*3/uL (ref 150–400)
RBC: 2.45 MIL/uL — ABNORMAL LOW (ref 3.87–5.11)
RDW: 13.5 % (ref 11.5–15.5)
WBC: 6.6 10*3/uL (ref 4.0–10.5)
nRBC: 0 % (ref 0.0–0.2)

## 2020-10-19 LAB — BASIC METABOLIC PANEL
Anion gap: 5 (ref 5–15)
BUN: 15 mg/dL (ref 8–23)
CO2: 24 mmol/L (ref 22–32)
Calcium: 6.8 mg/dL — ABNORMAL LOW (ref 8.9–10.3)
Chloride: 108 mmol/L (ref 98–111)
Creatinine, Ser: 0.69 mg/dL (ref 0.44–1.00)
GFR, Estimated: >60 mL/min (ref >60)
Glucose, Bld: 127 mg/dL — ABNORMAL HIGH (ref 70–99)
Potassium: 3.2 mmol/L — ABNORMAL LOW (ref 3.5–5.1)
Sodium: 137 mmol/L (ref 135–145)

## 2020-10-19 LAB — URINE CULTURE: Culture: <10000 — AB

## 2020-10-19 LAB — GLUCOSE, CAPILLARY
Glucose-Capillary: 118 mg/dL — ABNORMAL HIGH (ref 70–99)
Glucose-Capillary: 128 mg/dL — ABNORMAL HIGH (ref 70–99)
Glucose-Capillary: 158 mg/dL — ABNORMAL HIGH (ref 70–99)
Glucose-Capillary: 149 mg/dL — ABNORMAL HIGH (ref 70–99)

## 2020-10-19 LAB — PHOSPHORUS: Phosphorus: 2.7 mg/dL (ref 2.5–4.6)

## 2020-10-19 LAB — MAGNESIUM: Magnesium: 1.6 mg/dL — ABNORMAL LOW (ref 1.7–2.4)

## 2020-10-19 MED ORDER — POTASSIUM CHLORIDE CRYS ER 20 MEQ PO TBCR
40.0000 meq | EXTENDED_RELEASE_TABLET | ORAL | Status: AC
  Administered 2020-10-19: 40 meq via ORAL
  Filled 2020-10-19 (×2): qty 2

## 2020-10-19 MED ORDER — HALOPERIDOL LACTATE 5 MG/ML IJ SOLN: 1.0000 mg | Freq: Four times a day (QID) | INTRAMUSCULAR | Status: DC | PRN

## 2020-10-19 MED ORDER — OLANZAPINE 5 MG PO TABS
2.5000 mg | ORAL_TABLET | Freq: Every day | ORAL | Status: DC
  Administered 2020-10-19: 2.5 mg via ORAL
  Filled 2020-10-19: qty 1

## 2020-10-19 MED ORDER — METOPROLOL TARTRATE 5 MG/5ML IV SOLN: 5.0000 mg | INTRAVENOUS | Status: DC | PRN

## 2020-10-19 MED ORDER — MAGNESIUM SULFATE 2 GM/50ML IV SOLN
2.0000 g | Freq: Once | INTRAVENOUS | Status: AC
  Administered 2020-10-19: 2 g via INTRAVENOUS
  Filled 2020-10-19: qty 50

## 2020-10-19 NOTE — Progress Notes (Signed)
RN went to patient room to collect sample for Covid test. Patient told RN, "absolutely no!"    RN then asked patient if she could change the dressing on her arm wound.  Patient stated, "I said absolutely no!"   RN will attempt again later.

## 2020-10-19 NOTE — Evaluation (Signed)
Occupational Therapy Evaluation Patient Details Name: [name] MRN: [id number] DOB: Jun 09, 1951 Today's Date: 10/19/2020    History of Present Illness 69 yo female admitted with acute metabolic encephalopathy, hypoglycemia. CT (+) head hematoma. Fall on 10/30 at ALF. Hx of dementia, falls, DM, back pain, COPD, depression, anxiety   Clinical Impression   Mrs. [name] is a 69 year old from local ALF who presents today with confusion and constantly moaning and groaning though unable to state if she is in pain or where. On evaluation patient required mod assistance for transfer out of bed, mod assist to stand with RW, min assist to ambulate to bathroom, and max assist for ADLs except for total assist with LB dressing. Patient is an unreliable historian and her PLOF is unknown. However, suspect that patient needing a lot of assistance with ADLs and some assistance with ambulation. Patient will benefit from skilled OT services while in hospital to improve or maintain functional abilities. Would recommend return to familiar environment if ALF able to assist with her current level of function. If ALF unable to except patient back recommended SNF level of care at discharge.      Follow Up Recommendations  Return to ALF vs SNF    Equipment Recommendations  None recommended by OT    Recommendations for Other Services       Precautions / Restrictions Precautions Precautions: Fall Restrictions Weight Bearing Restrictions: No      Mobility Bed Mobility Overal bed mobility: Needs Assistance Bed Mobility: Supine to Sit     Supine to sit: Mod assist;HOB elevated     General bed mobility comments: Mod assist to guide Les and for trunk lift off. Patient groaning and moaning throughout and stating "wait, wait" though unable to state why.    Transfers Overall transfer level: Needs assistance Equipment used: Rolling walker (2 wheeled) Transfers: Sit to/from Stand Sit to Stand: Mod  assist         General transfer comment: Mod assist for bed height to stand. Min assist for ambulation, mostly for walker management and multimodal cues needed for sequencing task.    Balance Overall balance assessment: Needs assistance;History of Falls         Standing balance support: Bilateral upper extremity supported Standing balance-Leahy Scale: Poor                             ADL either performed or assessed with clinical judgement   ADL Overall ADL's : Needs assistance/impaired Eating/Feeding: Maximal assistance;Set up Eating/Feeding Details (indicate cue type and reason): Required assistance to eat this morning. Grooming: Sitting;Set up;Wash/dry face   Upper Body Bathing: Maximal assistance;Sitting   Lower Body Bathing: Maximal assistance;Sit to/from stand   Upper Body Dressing : Maximal assistance;Sitting   Lower Body Dressing: Total assistance;Sit to/from stand Lower Body Dressing Details (indicate cue type and reason): to don socks Toilet Transfer: Moderate assistance;Regular Toilet;Grab bars;RW;Ambulation;Cueing for sequencing Toilet Transfer Details (indicate cue type and reason): min guard and verbal cues to transfer onto toilet. Mod assist to stand from toilet. Verbal cues for sequencing and hand placement. Toileting- Clothing Manipulation and Hygiene: Maximal assistance;Sit to/from stand;Cueing for sequencing Toileting - Clothing Manipulation Details (indicate cue type and reason): Max assist for toileting. Patient able to pull underwear mostly down. Needed assistance to wipe and pull underwear up. Patient exhibiting difficulty staying on task.     Functional mobility during ADLs: Minimal assistance;Rolling walker General ADL  Comments: Min assist to manage walker and safety for ambulation.     Vision   Vision Assessment?: No apparent visual deficits     Perception     Praxis      Pertinent Vitals/Pain Pain Assessment: Faces Faces Pain  Scale: Hurts a little bit Pain Location: generalized - unable to state Pain Descriptors / Indicators: Grimacing;Moaning (moaning and groaning throughout all movement) Pain Intervention(s): Limited activity within patient's tolerance;Repositioned;Monitored during session     Hand Dominance Right   Extremity/Trunk Assessment Upper Extremity Assessment Upper Extremity Assessment: Overall WFL for tasks assessed   Lower Extremity Assessment Lower Extremity Assessment: Defer to PT evaluation   Cervical / Trunk Assessment Cervical / Trunk Assessment: Kyphotic   Communication Communication Communication: No difficulties   Cognition Arousal/Alertness: Awake/alert Behavior During Therapy: WFL for tasks assessed/performed Overall Cognitive Status: History of cognitive impairments - at baseline                                 General Comments: Dementia at baseline.   General Comments       Exercises     Shoulder Instructions      Home Living Family/patient expects to be discharged to:: Assisted living ([place])                             Home Equipment: Gilford Rile - 2 wheels;Walker - 4 wheels          Prior Functioning/Environment Level of Independence: Needs assistance  Gait / Transfers Assistance Needed: ambulatory possibly without a device-pt stated she doesn't use a walker. However, does well with holding onto walker. ADL's / Homemaking Assistance Needed: Needs asssistance for ADLs.            OT Problem List: Decreased activity tolerance;Impaired balance (sitting and/or standing);Decreased safety awareness;Decreased cognition;Pain;Decreased knowledge of use of DME or AE      OT Treatment/Interventions: Self-care/ADL training;Therapeutic exercise;DME and/or AE instruction;Patient/family education;Balance training;Therapeutic activities    OT Goals(Current goals can be found in the care plan section) Acute Rehab OT Goals Patient  Stated Goal: none stated OT Goal Formulation: Patient unable to participate in goal setting Time For Goal Achievement: 11/02/20 Potential to Achieve Goals: Fair  OT Frequency: Min 2X/week   Barriers to D/C:            Co-evaluation              AM-PAC OT "6 Clicks" Daily Activity     Outcome Measure Help from another person eating meals?: A Lot Help from another person taking care of personal grooming?: A Little Help from another person toileting, which includes using toliet, bedpan, or urinal?: A Lot Help from another person bathing (including washing, rinsing, drying)?: A Lot Help from another person to put on and taking off regular upper body clothing?: A Lot Help from another person to put on and taking off regular lower body clothing?: Total 6 Click Score: 12   End of Session Equipment Utilized During Treatment: Gait belt;Rolling walker Nurse Communication: Mobility status  Activity Tolerance: Patient tolerated treatment well Patient left: in chair;with call bell/phone within reach;with chair alarm set  OT Visit Diagnosis: Unsteadiness on feet (R26.81);Pain                Time: 1324-4010 OT Time Calculation (min): 22 min Charges:  OT General Charges $OT Visit: 1  Visit OT Evaluation $OT Eval Moderate Complexity: 1 Mod  Iain Sawchuk, OTR/L [place]  Office [phone] Pager: [place] 10/19/2020, 10:10 AM

## 2020-10-19 NOTE — Consult Note (Addendum)
69 year old female with history of dementia, unspecified CVA, diabetes mellitus type 2, recurrent UTIs, hypertension, chronic diastolic CHF, AVNRT status post ablation, COPD, hyperlipidemia, bariatric surgery presented from assisted living facility for altered mental status.  Patient was admitted for acute metabolic encephalopathy and UTI. At baseline she is alert, with some disorientation. Psych consult placed for hallucinations. She is currently taking Seroquel, Bupropion, and duloxetine. Last EKG obtained yesterday, QTc 516. Patient with multiple risk factors for QTc prolongation to include cardiac abnormalities, female, age >44, 3 Psychotropics that may prolong qtc, and electrolyte abnormalities. Will d/c Seroquel at this time due to recent QTc of 516. Will repeat EKG if QTc has improved may start Zyprexa 2.5mg  po qhs.  Duloxetine and Bupropion are both considered safe and due not impact QTC. No TSH was obtained during this admission, will order at this time. Patient current hallucinations likely consistent with ongoing infection and encephalopathy, once this resolves hallucinations should subside as well.  -Will dc Seroquel (qtc 516) and start olanzapine if qtc has improved.  -Patient does not meet inpatient criteria at this time.  -Psychiatry to sign off please call with any additional questions or concerns.  -Will add EKG and TSH at this time.  Repeat EKG shows improved QTC 443.  Will start olanzapine 2.5 mg p.o. nightly for psychosis and hallucinations.

## 2020-10-19 NOTE — Progress Notes (Signed)
NT notified RN that patient had completely undressed, taken off all her telemetry leads, and pulled out her IV.  RN came to assess patient.  Patient is very confused and aggravated.  She is talking nonsensically.  RN notified MD.

## 2020-10-19 NOTE — Progress Notes (Signed)
Patient ID: [name], female   DOB: 20-Oct-1951, 69 y.o.   MRN: [id number]  PROGRESS NOTE    [name]  NTZ:[id number] DOB: Dec 19, 1950 DOA: 10/17/2020 PCP: Roetta Sessions, NP   Brief Narrative:  69 year old female with history of dementia, unspecified CVA, diabetes mellitus type 2, recurrent UTIs, hypertension, chronic diastolic CHF, AVNRT status post ablation, COPD, hyperlipidemia, bariatric surgery presented from assisted living facility for altered mental status.  On presentation, patient was afebrile, tachycardic.  Glucose was 29, creatinine 1.1.  She was given amp of D50.  She was started on antibiotics for UTI.  Assessment & Plan:   Acute metabolic encephalopathy History of dementia with behavioral abnormalities Depression -Patient currently resides in memory care unit at assisted living facility.  Her baseline is apparently alert; however does not have hallucinations -Currently mental status stable.  Continue Seroquel, duloxetine and bupropion -Fall precautions.  PT recommends SNF placement although family wants the patient to return back to assisted living facility -Palliative care consult for goals of care discussion -Psychiatry evaluation for hallucinations  Doubt that patient had UTI -UA showed bacteria but only 0-5 WBCs.  Currently she is on Rocephin.  DC Rocephin.  Hypokalemia -Replace.  Repeat a.m. labs  Hypomagnesemia -Replace.  Repeat a.m. labs  Mild hyponatremia -Improved.  AKI -Treated with IV fluids.  Resolved.  DC IV fluids  Anemia of chronic disease -Questionable cause.  Hemoglobin stable.  Monitor  Hypoglycemia Diabetes mellitus type II -Currently on IV fluids with dextrose.  DC IV fluids.  Encourage oral intake.  Blood sugars improving.  A1c 4.8.  Prolonged QTC -QTC 516 on 10/18/2020.  Repeat a.m. EKG.  Chronic diastolic CHF -Currently compensated.  Strict input output.  Daily weights.  Outpatient follow-up  Generalized  deconditioning -Overall prognosis is guarded to poor.  Palliative care consultation for goals of care discussion    DVT prophylaxis: Lovenox Code Status: Full Family Communication: None at bedside Disposition Plan: Status is: Inpatient  Remains inpatient appropriate because:Inpatient level of care appropriate due to severity of illness   Dispo: The patient is from: ALF              Anticipated d/c is to: ALF              Anticipated d/c date is: 1 day              Patient currently is not medically stable to d/c.    Consultants: None  Procedures: None  Antimicrobials:  Anti-infectives (From admission, onward)   Start     Dose/Rate Route Frequency Ordered Stop   10/17/20 1530  cefTRIAXone (ROCEPHIN) 1 g in sodium chloride 0.9 % 100 mL IVPB        1 g 200 mL/hr over 30 Minutes Intravenous Every 24 hours 10/17/20 1501         Subjective: Patient seen and examined at bedside.  Very poor historian.  Wakes up slightly, hardly participates in any conversation.  No overnight fever, vomiting or seizures reported.  Objective: Vitals:   10/17/20 2222 10/18/20 0215 10/18/20 0548 10/18/20 1318  BP: (!) 141/74 (!) 106/56 123/64 (!) 100/50  Pulse: (!) 57 72 97 (!) 58  Resp: 20 16 16 16   Temp: 97.8 F (36.6 C) 98.4 F (36.9 C) 98.8 F (37.1 C) 98 F (36.7 C)  TempSrc: Oral Oral Oral Oral  SpO2: 99% 99% 98% 92%  Weight:    60.4 kg    Intake/Output Summary (Last 24 hours)  at 10/19/2020 1138 Last data filed at 10/19/2020 0655 Gross per 24 hour  Intake 100 ml  Output 100 ml  Net 0 ml   Filed Weights   10/18/20 1318  Weight: 60.4 kg    Examination:  General exam: Appears calm and comfortable.  Chronically ill looking, elderly female lying in bed. Respiratory system: Bilateral decreased breath sounds at bases with some scattered crackles Cardiovascular system: S1 & S2 heard, intermittently bradycardic Gastrointestinal system: Abdomen is nondistended, soft and nontender.  Normal bowel sounds heard. Extremities: No cyanosis, clubbing; trace lower extremity edema Central nervous system:  Wakes up slightly, hardly participates in any conversation.  No focal neurological deficits. Moving extremities Skin: No rashes, lesions or ulcers Psychiatry: Could not be assessed because of mental status   Data Reviewed: I have personally reviewed following labs and imaging studies  CBC: Recent Labs  Lab 10/17/20 1147 10/17/20 1529 10/18/20 0500 10/19/20 0525  WBC 8.3 9.2 7.9 6.6  NEUTROABS 6.3  --   --  4.4  HGB 9.6* 9.0* 9.2* 7.9*  HCT 28.9* 27.4* 26.3* 24.0*  MCV 99.0 99.3 95.3 98.0  PLT 274 245 263 740   Basic Metabolic Panel: Recent Labs  Lab 10/17/20 1147 10/17/20 1529 10/18/20 0500 10/18/20 0850 10/19/20 0525  NA 140  --  134*  --  137  K 3.8  --  3.4*  --  3.2*  CL 104  --  101  --  108  CO2 23  --  23  --  24  GLUCOSE 39*  --  78  --  127*  BUN 26*  --  19  --  15  CREATININE 1.10* 1.12* 0.82  --  0.69  CALCIUM 7.6*  --  7.3*  --  6.8*  MG  --  1.5*  --  1.9 1.6*  PHOS  --  3.5  --   --  2.7   GFR: CrCl cannot be calculated (Unknown ideal weight.). Liver Function Tests: Recent Labs  Lab 10/17/20 1147  AST 31  ALT 32  ALKPHOS 67  BILITOT 1.3*  PROT 4.4*  ALBUMIN 2.0*   No results for input(s): LIPASE, AMYLASE in the last 168 hours. Recent Labs  Lab 10/17/20 1147  AMMONIA 16   Coagulation Profile: No results for input(s): INR, PROTIME in the last 168 hours. Cardiac Enzymes: No results for input(s): CKTOTAL, CKMB, CKMBINDEX, TROPONINI in the last 168 hours. BNP (last 3 results) No results for input(s): PROBNP in the last 8760 hours. HbA1C: Recent Labs    10/19/20 0525  HGBA1C 4.8   CBG: Recent Labs  Lab 10/18/20 1943 10/18/20 2353 10/19/20 0402 10/19/20 0744 10/19/20 1119  GLUCAP 129* 142* 118* 128* 158*   Lipid Profile: Recent Labs    10/19/20 0525  CHOL 61  HDL 13*  LDLCALC 34  TRIG 72  CHOLHDL 4.7    Thyroid Function Tests: No results for input(s): TSH, T4TOTAL, FREET4, T3FREE, THYROIDAB in the last 72 hours. Anemia Panel: Recent Labs    10/18/20 0500  VITAMINB12 1,241*   Sepsis Labs: Recent Labs  Lab 10/17/20 1147  LATICACIDVEN 2.7*    Recent Results (from the past 240 hour(s))  Respiratory Panel by RT PCR (Flu A&B, Covid) - Nasopharyngeal Swab     Status: None   Collection Time: 10/17/20  1:45 PM   Specimen: Nasopharyngeal Swab  Result Value Ref Range Status   SARS Coronavirus 2 by RT PCR NEGATIVE NEGATIVE Final    Comment: (NOTE)  SARS-CoV-2 target nucleic acids are NOT DETECTED.  The SARS-CoV-2 RNA is generally detectable in upper respiratoy specimens during the acute phase of infection. The lowest concentration of SARS-CoV-2 viral copies this assay can detect is 131 copies/mL. A negative result does not preclude SARS-Cov-2 infection and should not be used as the sole basis for treatment or other patient management decisions. A negative result may occur with  improper specimen collection/handling, submission of specimen other than nasopharyngeal swab, presence of viral mutation(s) within the areas targeted by this assay, and inadequate number of viral copies (<131 copies/mL). A negative result must be combined with clinical observations, patient history, and epidemiological information. The expected result is Negative.  Fact Sheet for Patients:  PinkCheek.be  Fact Sheet for Healthcare Providers:  GravelBags.it  This test is no t yet approved or cleared by the [place] FDA and  has been authorized for detection and/or diagnosis of SARS-CoV-2 by FDA under an Emergency Use Authorization (EUA). This EUA will remain  in effect (meaning this test can be used) for the duration of the COVID-19 declaration under Section 564(b)(1) of the Act, 21 U.S.C. section 360bbb-3(b)(1), unless the authorization is  terminated or revoked sooner.     Influenza A by PCR NEGATIVE NEGATIVE Final   Influenza B by PCR NEGATIVE NEGATIVE Final    Comment: (NOTE) The Xpert Xpress SARS-CoV-2/FLU/RSV assay is intended as an aid in  the diagnosis of influenza from Nasopharyngeal swab specimens and  should not be used as a sole basis for treatment. Nasal washings and  aspirates are unacceptable for Xpert Xpress SARS-CoV-2/FLU/RSV  testing.  Fact Sheet for Patients: PinkCheek.be  Fact Sheet for Healthcare Providers: GravelBags.it  This test is not yet approved or cleared by the [place] FDA and  has been authorized for detection and/or diagnosis of SARS-CoV-2 by  FDA under an Emergency Use Authorization (EUA). This EUA will remain  in effect (meaning this test can be used) for the duration of the  Covid-19 declaration under Section 564(b)(1) of the Act, 21  U.S.C. section 360bbb-3(b)(1), unless the authorization is  terminated or revoked. Performed at [facility], [place] [address] [place], [place] [address]   Culture, blood (routine x 2)     Status: None (Preliminary result)   Collection Time: 10/17/20  1:45 PM   Specimen: BLOOD  Result Value Ref Range Status   Specimen Description   Final    BLOOD LEFT ANTECUBITAL Performed at [facility] [address] [place], [place] [address]    Special Requests   Final    BOTTLES DRAWN AEROBIC AND ANAEROBIC Blood Culture adequate volume Performed at [facility] [address], [place], [place] [address]    Culture   Final    NO GROWTH 2 DAYS Performed at [facility] [address], [place], [place] [address]    Report Status PENDING  Incomplete  Culture, blood (routine x 2)     Status: None (Preliminary result)   Collection Time: 10/17/20  1:46 PM   Specimen: BLOOD LEFT FOREARM  Result Value Ref Range Status   Specimen  Description   Final    BLOOD LEFT FOREARM Performed at [facility] [address] [place], [place] [address]    Special Requests   Final    BOTTLES DRAWN AEROBIC AND ANAEROBIC Blood Culture adequate volume Performed at [facility] [address] [place], [place] [address]    Culture  Final    NO GROWTH 2 DAYS Performed at [facility], [place] [address], [place], [place] [address]    Report Status PENDING  Incomplete         Radiology Studies: CT Head Wo Contrast  Result Date: 10/17/2020 CLINICAL DATA:  Mental status change.  Recent fall. EXAM: CT HEAD WITHOUT CONTRAST TECHNIQUE: Contiguous axial images were obtained from the base of the skull through the vertex without intravenous contrast. COMPARISON:  10/15/2020. FINDINGS: Brain: Interval development of a low-density, approximately 7 mm thick subdural fluid collection along the left cerebral convexity. Mild associated mass effect with approximately 3 mm of rightward midline shift. Remote right frontal infarct. Similar generalized cerebral atrophy and chronic microvascular ischemic change. No hydrocephalus. No mass lesion. No evidence of acute hemorrhage. No evidence of acute large vascular territory infarct. Vascular: Calcific atherosclerosis. Skull: No acute fracture. Sinuses/Orbits: Mucosal thickening and opacification of anterior right ethmoid air cells. Unremarkable orbits. Other: No mastoid effusions. IMPRESSION: Interval development of a low-density, approximately 7 mm thick subdural fluid collection along the left cerebral convexity. Mild associated mass effect with 3 mm of rightward midline shift. Electronically Signed   By: Margaretha Sheffield MD   On: 10/17/2020 13:11   DG Chest Port 1 View  Result Date: 10/17/2020 CLINICAL DATA:  Shortness of breath. EXAM: PORTABLE CHEST 1 VIEW COMPARISON:  September 01, 2017. FINDINGS: The heart size and mediastinal contours are within normal  limits. No pneumothorax or pleural effusion is noted. Status post coronary bypass graft. Both lungs are clear. The visualized skeletal structures are unremarkable. IMPRESSION: No active disease. Electronically Signed   By: Marijo Conception M.D.   On: 10/17/2020 12:45        Scheduled Meds: . aspirin  81 mg Oral Daily  . buPROPion  150 mg Oral Daily  . DULoxetine  60 mg Oral Daily  . enoxaparin (LOVENOX) injection  40 mg Subcutaneous Daily  . multivitamin  1 tablet Oral Daily  . potassium chloride  40 mEq Oral Q4H  . QUEtiapine  12.5 mg Oral Daily  . QUEtiapine  25 mg Oral QHS  . sodium chloride flush  3 mL Intravenous Q12H   Continuous Infusions: . cefTRIAXone (ROCEPHIN)  IV 1 g (10/18/20 1750)  . dextrose 5 % and 0.9% NaCl 75 mL/hr at 10/19/20 1056  . magnesium sulfate bolus IVPB 2 g (10/19/20 1112)          Aline August, MD [facility] 10/19/2020, 11:38 AM

## 2020-10-19 NOTE — TOC Progression Note (Signed)
Transition of Care [facility]) - Progression Note    Patient Details  Name: NICHOLAS OSSA MRN: [id number] Date of Birth: 07/08/1951  Transition of Care [facility]) CM/SW Contact  Makynna Manocchio, Juliann Pulse, RN Phone Number: 10/19/2020, 2:22 PM  Clinical Narrative: If stable in am plan to return back to ALF-memory care [facility] rep Raymon Mutton following. Will need PT to see in early am-Nsg aware.Order covid. PTAR for transport.      Expected Discharge Plan: Memory Care Barriers to Discharge: Continued Medical Work up  Expected Discharge Plan and Services Expected Discharge Plan: Memory Care   Discharge Planning Services: CM Consult Post Acute Care Choice: Resumption of Svcs/PTA Provider Living arrangements for the past 2 months: [place] (memory care)                                       Social Determinants of Health (SDOH) Interventions    Readmission Risk Interventions Readmission Risk Prevention Plan 10/18/2020  PCP or Specialist Appt within 3-5 Days Complete  HRI or [place] Complete  Social Work Consult for [place] Planning/Counseling Complete  Palliative Care Screening Complete  Medication Review Press photographer) Complete  Some recent data might be hidden

## 2020-10-20 DIAGNOSIS — E162 Hypoglycemia, unspecified: Secondary | ICD-10-CM

## 2020-10-20 LAB — GLUCOSE, CAPILLARY
Glucose-Capillary: 129 mg/dL — ABNORMAL HIGH (ref 70–99)
Glucose-Capillary: 133 mg/dL — ABNORMAL HIGH (ref 70–99)
Glucose-Capillary: 77 mg/dL (ref 70–99)
Glucose-Capillary: 80 mg/dL (ref 70–99)

## 2020-10-20 LAB — VITAMIN B1: Vitamin B1 (Thiamine): 159.5 nmol/L (ref 66.5–200.0)

## 2020-10-20 MED ORDER — OLANZAPINE 2.5 MG PO TABS: 2.5000 mg | ORAL_TABLET | Freq: Every day | ORAL | 0 refills | Status: AC

## 2020-10-20 MED ORDER — ACETAMINOPHEN 500 MG PO TABS: 500.0000 mg | ORAL_TABLET | Freq: Four times a day (QID) | ORAL | Status: DC | PRN

## 2020-10-20 NOTE — TOC Transition Note (Addendum)
Transition of Care [facility]) - CM/SW Discharge Note   Patient Details  Name: [name] MRN: [id number] Date of Birth: [birth year]  [facility]) CM/SW Contact:  Dessa Phi, RN Phone Number: 10/20/2020, 11:14 AM   Clinical Narrative:  D/[facility] back to [name] care unit w/Palliative care services outpatient. PTAR for transport.Await d/c summary,rm#,nsg report tel#.  12:27p-PTAR called going t rm#14,nsg report tel#[phone].    Final next level of care: Memory Care Barriers to Discharge: No Barriers Identified   Patient Goals and CMS Choice Patient states their goals for this hospitalization and ongoing recovery are:: per brother [name] return back to ALF CMS Medicare.gov Compare Post Acute Care list provided to:: Patient Represenative (must comment) Choice offered to / list presented to : Sibling  Discharge Placement                       Discharge Plan and Services   Discharge Planning Services: CM Consult Post Acute Care Choice: Resumption of Svcs/PTA Provider                               Social Determinants of Health (SDOH) Interventions     Readmission Risk Interventions Readmission Risk Prevention Plan 10/18/2020  PCP or Specialist Appt within 3-5 Days Complete  HRI or [place] Complete  Social Work Consult for [place] Planning/Counseling Complete  Palliative Care Screening Complete  Medication Review Press photographer) Complete  Some recent data might be hidden

## 2020-10-20 NOTE — Progress Notes (Addendum)
Physical Therapy Treatment Patient Details Name: [name] MRN: [id number] DOB: 1951-02-03 Today's Date: 10/20/2020    History of Present Illness 69 yo female admitted with acute metabolic encephalopathy, hypoglycemia. CT (+) head hematoma. Fall on 10/30 at ALF. Hx of dementia, falls, DM, back pain, COPD, depression, anxiety    PT Comments    Pt awake but AxO x1 self only. Unfamiliar environment pt initially says no to everything, but was able to redirect with VCs that we were here to help her get out of bed to the recliner to eat breakfast. General bed mobility comments: Min assist with 75% VCs to encourage self-participation secondary to AMS; pt is quick to say no but can be redirected to complete task. General transfer comment: Requires 75% VCs with hald-held assist to rise from bed but physically able; pt needs redirecting to complete task due to AMS. General Gait Details: Required 25% VCs ambulated plus one hand-held assist from bed to recliner; pt was slow but steady, no overt LOB and able to complete turn to recliner safely using hands to steady self. Will consult LPT D/C plans are now to return to ALF memory unit.    Follow Up Recommendations  Home health PT (return to ALF memory unit)     Equipment Recommendations  None recommended by PT    Recommendations for Other Services       Precautions / Restrictions Precautions Precautions: Fall Restrictions Weight Bearing Restrictions: No    Mobility  Bed Mobility Overal bed mobility: Needs Assistance       Supine to sit: Min assist     General bed mobility comments: Min assist with 75% VCs to encourage self-participation secondary to AMS; pt is quick to say no but can be redirected to complete task  Transfers Overall transfer level: Needs assistance Equipment used: 1 person hand held assist;None Transfers: Sit to/from Stand Sit to Stand: Min guard;+2 safety/equipment         General transfer comment: Requires  75% VCs with hald-held assist to rise from bed but physically able; pt needs redirecting to complete task due to AMS  Ambulation/Gait Ambulation/Gait assistance: Min guard Gait Distance (Feet): 5 Feet Assistive device: Rolling walker (2 wheeled) Gait Pattern/deviations: Step-through pattern;Decreased stride length;Shuffle Gait velocity: decreased   General Gait Details: Required 25% VCs ambulated plus one hand-held assist from bed to recliner; pt was slow but steady, no overt LOB and able to complete turn to recliner safely using hands to steady self   Stairs             Wheelchair Mobility    Modified Rankin (Stroke Patients Only)       Balance                                            Cognition                                              Exercises      General Comments        Pertinent Vitals/Pain      Home Living                      Prior Function  PT Goals (current goals can now be found in the care plan section) Acute Rehab PT Goals Patient Stated Goal: none stated PT Goal Formulation: Patient unable to participate in goal setting Time For Goal Achievement: 11/01/20 Potential to Achieve Goals: Good Progress towards PT goals: Progressing toward goals    Frequency    Min 3X/week      PT Plan Discharge plan needs to be updated    Co-evaluation              AM-PAC PT "6 Clicks" Mobility   Outcome Measure  Help needed turning from your back to your side while in a flat bed without using bedrails?: A Little Help needed moving from lying on your back to sitting on the side of a flat bed without using bedrails?: A Little Help needed moving to and from a bed to a chair (including a wheelchair)?: A Little Help needed standing up from a chair using your arms (e.g., wheelchair or bedside chair)?: A Little Help needed to walk in hospital room?: A Little Help needed climbing 3-5  steps with a railing? : A Lot 6 Click Score: 17    End of Session Equipment Utilized During Treatment: Gait belt Activity Tolerance: Patient tolerated treatment well Patient left: in chair;with call bell/phone within reach;with chair alarm set   PT Visit Diagnosis: History of falling (Z91.81);Muscle weakness (generalized) (M62.81);Difficulty in walking, not elsewhere classified (R26.2)     Time:  - 9:35 - 10:07    Charges:    1 gt   1 ta                    [place], [place] [phone]  Rica Koyanagi  PTA [place] Pager      [phone] Office      [phone]

## 2020-10-20 NOTE — TOC Progression Note (Signed)
Transition of Care [facility]) - Progression Note    Patient Details  Name: [name] MRN: [id number] Date of Birth: 10/31/1951  [facility]) CM/SW Contact  Joe Gee, Juliann Pulse, RN Phone Number: 10/20/2020, 10:37 AM  Clinical Narrative:medically stable for d/c.Await call back form [facility]-memory care unit rep Donnetta Simpers if able to accept back. [facility]-memory care unit w/Palliative Care Service-otpt.       Expected Discharge Plan: Memory Care Barriers to Discharge: No Barriers Identified  Expected Discharge Plan and Services Expected Discharge Plan: Memory Care   Discharge Planning Services: CM Consult Post Acute Care Choice: Resumption of Svcs/PTA Provider Living arrangements for the past 2 months: [place] (memory care)                                       Social Determinants of Health (SDOH) Interventions    Readmission Risk Interventions Readmission Risk Prevention Plan 10/18/2020  PCP or Specialist Appt within 3-5 Days Complete  HRI or [place] Complete  Social Work Consult for [place] Planning/Counseling Complete  Palliative Care Screening Complete  Medication Review Press photographer) Complete  Some recent data might be hidden

## 2020-10-20 NOTE — Evaluation (Signed)
SLP Cancellation Note  Patient Details Name: [name] MRN: [id number] DOB: 1951-02-14   Cancelled treatment:       Reason Eval/Treat Not Completed: Other (comment) (Pt awake, reports she had a bowel movement, advised nurse tech and will continue efforts.  Pt does have h/o esophageal dysmotility.)   Macario Golds 10/20/2020, 8:41 AM   Kathleen Lime, MS [facility] SLP Acute Rehab Services Office [phone] Pager [phone]

## 2020-10-20 NOTE — Progress Notes (Signed)
Patient ID: [name], female   DOB: [birth year], 69 y.o.   MRN: [id number]  PROGRESS NOTE    AHNYLA MENDEL  SWH:[id number] DOB: 02/23/1951 DOA: 10/17/2020 PCP: Roetta Sessions, NP   Brief Narrative:  69 year old female with history of dementia, unspecified CVA, diabetes mellitus type 2, recurrent UTIs, hypertension, chronic diastolic CHF, AVNRT status post ablation, COPD, hyperlipidemia, bariatric surgery presented from assisted living facility for altered mental status.  On presentation, patient was afebrile, tachycardic.  Glucose was 29, creatinine 1.1.  She was given amp of D50.  She was started on antibiotics for UTI.  Assessment & Plan:   Acute metabolic encephalopathy History of dementia with behavioral abnormalities Depression -Patient currently resides in memory care unit at assisted living facility.  Her baseline is apparently alert; however does not have hallucinations -Currently mental status stable.  Continue duloxetine and bupropion -Fall precautions.  PT recommends SNF placement although family wants the patient to return back to assisted living facility -Palliative care consult for goals of care discussion is pending -Psychiatry recommended to switch Seroquel to Zyprexa.  Patient was apparently agitated yesterday evening but is currently calm and has not required any as needed Haldol yet.  Psychiatry does not recommend inpatient psychiatric hospitalization.  Outpatient follow-up with psychiatry.  Doubt that patient had UTI -UA showed bacteria but only 0-5 WBCs.  Rocephin discontinued on 10/19/2020.  Hypokalemia -No labs today.  Hypomagnesemia -No labs today.  Mild hyponatremia -Improved.  AKI -Treated with IV fluids.  Resolved.  DC IV fluids  Anemia of chronic disease -Questionable cause.  Hemoglobin stable.  Monitor  Hypoglycemia Diabetes mellitus type II -Currently on IV fluids with dextrose.  Off IV fluids.  Encourage oral intake.  Blood sugars  improving.  A1c 4.8.  Prolonged QTC -QTC 516 on 10/18/2020.  QTc improving.  Chronic diastolic CHF -Currently compensated.  Strict input output.  Daily weights.  Outpatient follow-up  Generalized deconditioning -Overall prognosis is guarded to poor.  Palliative care consultation for goals of care discussion    DVT prophylaxis: Lovenox Code Status: Full Family Communication: None at bedside Disposition Plan: Status is: Inpatient  Remains inpatient appropriate because:Inpatient level of care appropriate due to severity of illness   Dispo: The patient is from: ALF              Anticipated d/c is to: ALF              Anticipated d/c date is: 1 day              Patient currently is medically stable to d/c.    Consultants: Psychiatry.  Palliative care evaluation pending  Procedures: None  Antimicrobials:  Anti-infectives (From admission, onward)   Start     Dose/Rate Route Frequency Ordered Stop   10/17/20 1530  cefTRIAXone (ROCEPHIN) 1 g in sodium chloride 0.9 % 100 mL IVPB  Status:  Discontinued        1 g 200 mL/hr over 30 Minutes Intravenous Every 24 hours 10/17/20 1501 10/19/20 1154       Subjective: Patient seen and examined at bedside.  Poor historian.  Awake but slightly confused to time.  Does not participate in conversation much.  Patient was slightly more agitated yesterday afternoon but did not require any as needed Haldol as per nursing staff.  No overnight fever or vomiting reported. Objective: Vitals:   10/19/20 1958 10/20/20 0500 10/20/20 0542 10/20/20 0716  BP: (!) 107/53  127/73   Pulse: 77  91   Resp: 20  18   Temp: 97.7 F (36.5 C)  97.6 F (36.4 C)   TempSrc: Oral  Oral   SpO2: 100%  100%   Weight:  62.6 kg    Height:    5\' 2"  (1.575 m)    Intake/Output Summary (Last 24 hours) at 10/20/2020 1005 Last data filed at 10/19/2020 2017 Gross per 24 hour  Intake 1387.08 ml  Output --  Net 1387.08 ml   Filed Weights   10/18/20 1318 10/20/20 0500   Weight: 60.4 kg 62.6 kg    Examination:  General exam: Currently appears calm and in no distress.  Chronically ill looking, elderly female lying in bed.  Very poor historian and hardly participates in any conversation. Respiratory system: Bilateral decreased breath sounds at bases with no wheezing cardiovascular system: Currently rate controlled, S1-S2 heard Gastrointestinal system: Abdomen is nondistended, soft and nontender.  Bowel sounds are heard  extremities: Mild lower extremity edema present.  No clubbing     Data Reviewed: I have personally reviewed following labs and imaging studies  CBC: Recent Labs  Lab 10/17/20 1147 10/17/20 1529 10/18/20 0500 10/19/20 0525  WBC 8.3 9.2 7.9 6.6  NEUTROABS 6.3  --   --  4.4  HGB 9.6* 9.0* 9.2* 7.9*  HCT 28.9* 27.4* 26.3* 24.0*  MCV 99.0 99.3 95.3 98.0  PLT 274 245 263 696   Basic Metabolic Panel: Recent Labs  Lab 10/17/20 1147 10/17/20 1529 10/18/20 0500 10/18/20 0850 10/19/20 0525  NA 140  --  134*  --  137  K 3.8  --  3.4*  --  3.2*  CL 104  --  101  --  108  CO2 23  --  23  --  24  GLUCOSE 39*  --  78  --  127*  BUN 26*  --  19  --  15  CREATININE 1.10* 1.12* 0.82  --  0.69  CALCIUM 7.6*  --  7.3*  --  6.8*  MG  --  1.5*  --  1.9 1.6*  PHOS  --  3.5  --   --  2.7   GFR: Estimated Creatinine Clearance: 56.9 mL/min (by C-G formula based on SCr of 0.69 mg/dL). Liver Function Tests: Recent Labs  Lab 10/17/20 1147  AST 31  ALT 32  ALKPHOS 67  BILITOT 1.3*  PROT 4.4*  ALBUMIN 2.0*   No results for input(s): LIPASE, AMYLASE in the last 168 hours. Recent Labs  Lab 10/17/20 1147  AMMONIA 16   Coagulation Profile: No results for input(s): INR, PROTIME in the last 168 hours. Cardiac Enzymes: No results for input(s): CKTOTAL, CKMB, CKMBINDEX, TROPONINI in the last 168 hours. BNP (last 3 results) No results for input(s): PROBNP in the last 8760 hours. HbA1C: Recent Labs    10/19/20 0525  HGBA1C 4.8    CBG: Recent Labs  Lab 10/19/20 1119 10/19/20 2003 10/20/20 0000 10/20/20 0431 10/20/20 0741  GLUCAP 158* 149* 129* 77 80   Lipid Profile: Recent Labs    10/19/20 0525  CHOL 61  HDL 13*  LDLCALC 34  TRIG 72  CHOLHDL 4.7   Thyroid Function Tests: No results for input(s): TSH, T4TOTAL, FREET4, T3FREE, THYROIDAB in the last 72 hours. Anemia Panel: Recent Labs    10/18/20 0500  VITAMINB12 1,241*   Sepsis Labs: Recent Labs  Lab 10/17/20 1147  LATICACIDVEN 2.7*    Recent Results (from the past 240 hour(s))  Respiratory Panel by RT  PCR (Flu A&B, Covid) - Nasopharyngeal Swab     Status: None   Collection Time: 10/17/20  1:45 PM   Specimen: Nasopharyngeal Swab  Result Value Ref Range Status   SARS Coronavirus 2 by RT PCR NEGATIVE NEGATIVE Final    Comment: (NOTE) SARS-CoV-2 target nucleic acids are NOT DETECTED.  The SARS-CoV-2 RNA is generally detectable in upper respiratoy specimens during the acute phase of infection. The lowest concentration of SARS-CoV-2 viral copies this assay can detect is 131 copies/mL. A negative result does not preclude SARS-Cov-2 infection and should not be used as the sole basis for treatment or other patient management decisions. A negative result may occur with  improper specimen collection/handling, submission of specimen other than nasopharyngeal swab, presence of viral mutation(s) within the areas targeted by this assay, and inadequate number of viral copies (<131 copies/mL). A negative result must be combined with clinical observations, patient history, and epidemiological information. The expected result is Negative.  Fact Sheet for Patients:  PinkCheek.be  Fact Sheet for Healthcare Providers:  GravelBags.it  This test is no t yet approved or cleared by the [place] FDA and  has been authorized for detection and/or diagnosis of SARS-CoV-2 by FDA under an  Emergency Use Authorization (EUA). This EUA will remain  in effect (meaning this test can be used) for the duration of the COVID-19 declaration under Section 564(b)(1) of the Act, 21 U.S.C. section 360bbb-3(b)(1), unless the authorization is terminated or revoked sooner.     Influenza A by PCR NEGATIVE NEGATIVE Final   Influenza B by PCR NEGATIVE NEGATIVE Final    Comment: (NOTE) The Xpert Xpress SARS-CoV-2/FLU/RSV assay is intended as an aid in  the diagnosis of influenza from Nasopharyngeal swab specimens and  should not be used as a sole basis for treatment. Nasal washings and  aspirates are unacceptable for Xpert Xpress SARS-CoV-2/FLU/RSV  testing.  Fact Sheet for Patients: PinkCheek.be  Fact Sheet for Healthcare Providers: GravelBags.it  This test is not yet approved or cleared by the [place] FDA and  has been authorized for detection and/or diagnosis of SARS-CoV-2 by  FDA under an Emergency Use Authorization (EUA). This EUA will remain  in effect (meaning this test can be used) for the duration of the  Covid-19 declaration under Section 564(b)(1) of the Act, 21  U.S.C. section 360bbb-3(b)(1), unless the authorization is  terminated or revoked. Performed at [facility], [place] [address], [place], [place] [address]   Culture, blood (routine x 2)     Status: None (Preliminary result)   Collection Time: 10/17/20  1:45 PM   Specimen: BLOOD  Result Value Ref Range Status   Specimen Description   Final    BLOOD LEFT ANTECUBITAL Performed at [facility] [address], [place], [place] [address]    Special Requests   Final    BOTTLES DRAWN AEROBIC AND ANAEROBIC Blood Culture adequate volume Performed at [facility] [address], [place], [place] [address]    Culture   Final    NO GROWTH 3 DAYS Performed at [facility], [place] [address]  [place], [place] [address]    Report Status PENDING  Incomplete  Culture, blood (routine x 2)     Status: None (Preliminary result)   Collection Time: 10/17/20  1:46 PM   Specimen: BLOOD LEFT FOREARM  Result Value Ref Range Status   Specimen Description   Final    BLOOD LEFT FOREARM Performed at  [facility], [place] [address], [place], [place] [address]    Special Requests   Final    BOTTLES DRAWN AEROBIC AND ANAEROBIC Blood Culture adequate volume Performed at [facility] [address] [place], [place] [address]    Culture   Final    NO GROWTH 3 DAYS Performed at [facility], [place] [address] [place], [place] [address]    Report Status PENDING  Incomplete  Culture, Urine     Status: Abnormal   Collection Time: 10/18/20  6:31 PM   Specimen: Urine, Clean Catch  Result Value Ref Range Status   Specimen Description   Final    URINE, CLEAN CATCH Performed at [facility], [place] [address], [place], [place] [address]    Special Requests   Final    NONE Performed at [facility], [place] [address], [place], [place] [address]    Culture (A)  Final    <10,000 COLONIES/mL INSIGNIFICANT GROWTH Performed at [facility] [address] [place], [place] [address]    Report Status 10/19/2020 FINAL  Final  SARS Coronavirus 2 by RT PCR (hospital order, performed in [facility] hospital lab) Nasopharyngeal Nasopharyngeal Swab     Status: None   Collection Time: 10/19/20  5:45 PM   Specimen: Nasopharyngeal Swab  Result Value Ref Range Status   SARS Coronavirus 2 NEGATIVE NEGATIVE Final    Comment: (NOTE) SARS-CoV-2 target nucleic acids are NOT DETECTED.  The SARS-CoV-2 RNA is generally detectable in upper and lower respiratory specimens during the acute phase of infection. The lowest concentration of SARS-CoV-2 viral copies this assay can detect is 250 copies / mL. A negative result does not preclude  SARS-CoV-2 infection and should not be used as the sole basis for treatment or other patient management decisions.  A negative result may occur with improper specimen collection / handling, submission of specimen other than nasopharyngeal swab, presence of viral mutation(s) within the areas targeted by this assay, and inadequate number of viral copies (<250 copies / mL). A negative result must be combined with clinical observations, patient history, and epidemiological information.  Fact Sheet for Patients:   StrictlyIdeas.no  Fact Sheet for Healthcare Providers: BankingDealers.co.za  This test is not yet approved or  cleared by the [place] FDA and has been authorized for detection and/or diagnosis of SARS-CoV-2 by FDA under an Emergency Use Authorization (EUA).  This EUA will remain in effect (meaning this test can be used) for the duration of the COVID-19 declaration under Section 564(b)(1) of the Act, 21 U.S.C. section 360bbb-3(b)(1), unless the authorization is terminated or revoked sooner.  Performed at [facility], [place] [address], [place], [place] [address]          Radiology Studies: No results found.      Scheduled Meds: . aspirin  81 mg Oral Daily  . buPROPion  150 mg Oral Daily  . DULoxetine  60 mg Oral Daily  . enoxaparin (LOVENOX) injection  40 mg Subcutaneous Daily  . multivitamin  1 tablet Oral Daily  . OLANZapine  2.5 mg Oral QHS  . sodium chloride flush  3 mL Intravenous Q12H   Continuous Infusions:         Aline August, MD [facility] 10/20/2020, 10:05 AM

## 2020-10-20 NOTE — Discharge Summary (Signed)
Physician Discharge Summary  MYANA SCHLUP MGN:[id number] DOB: 1951-09-16 DOA: 10/17/2020  PCP: Roetta Sessions, NP  Admit date: 10/17/2020 Discharge date: 10/20/2020  Admitted From: ALF Disposition: ALF  Recommendations for Outpatient Follow-up:  1. Follow up with PCP in 1 week  2. Outpatient follow-up with psychiatry at earliest convenience 3. Recommend outpatient evaluation and follow-up with palliative care 4. Follow up in ED if symptoms worsen or new appear   Home Health: No Equipment/Devices: None  Discharge Condition: Guarded to poor CODE STATUS: Full Diet recommendation: Heart healthy/carb modified  Brief/Interim Summary: 69 year old female with history of dementia, unspecified CVA, diabetes mellitus type 2, recurrent UTIs, hypertension, chronic diastolic CHF, AVNRT status post ablation, COPD, hyperlipidemia, bariatric surgery presented from assisted living facility for altered mental status.  On presentation, patient was afebrile, tachycardic.  Glucose was 29, creatinine 1.1.  She was given amp of D50.  She was started on antibiotics for UTI.  During the hospitalization, her condition gradually improved.  Rocephin was discontinued as UTI was ruled out.  Her blood sugars improved.  Psychiatry was consulted for hallucinations who recommended to switch Seroquel to Zyprexa.  PT recommended SNF placement.  Family wants the patient to return back to ALF.  Palliative care consultation is pending.  Patient will be discharged to ALF with outpatient palliative care evaluation and follow-up and follow-up with psychiatry at earliest convenience.  Discharge Diagnoses:   Acute metabolic encephalopathy History of dementia with behavioral abnormalities Depression -Patient currently resides in memory care unit at assisted living facility.  Her baseline is apparently alert; however does not have hallucinations -Currently mental status stable.  Continue duloxetine and bupropion -Fall  precautions.  PT recommends SNF placement although family wants the patient to return back to assisted living facility -Palliative care consult for goals of care discussion is pending -Psychiatry recommended to switch Seroquel to Zyprexa.  Patient was apparently agitated yesterday evening but is currently calm and has not required any as needed Haldol yet.  Psychiatry does not recommend inpatient psychiatric hospitalization.  Outpatient follow-up with psychiatry. -Discharge patient back to ALF today  Doubt that patient had UTI -UA showed bacteria but only 0-5 WBCs.  Rocephin discontinued on 10/19/2020.  Hypokalemia -No labs today.  Hypomagnesemia -No labs today.  Mild hyponatremia -Improved.  AKI -Treated with IV fluids.  Resolved.  DC IV fluids  Anemia of chronic disease -Questionable cause.  Hemoglobin stable.  Monitor  Hypoglycemia Diabetes mellitus type II -Off IV fluids.  Encourage oral intake.  Blood sugars improving.  A1c 4.8. -Continue to hold oral meds upon discharge as well.  Follow-up with PCP regarding the same.  Prolonged QTC -QTC 516 on 10/18/2020.  QTc improving.  Chronic diastolic CHF -Currently compensated.  Strict input output.  Daily weights.  Outpatient follow-up  Generalized deconditioning -Overall prognosis is guarded to poor.  Palliative care consultation for goals of care discussion is pending.  This can happen as an outpatient.  Currently still full code.     Discharge Instructions  Discharge Instructions    Amb Referral to Palliative Care   Complete by: As directed    Goals of care discussion   Ambulatory referral to Psychiatry   Complete by: As directed    Hallucinations   Diet - low sodium heart healthy   Complete by: As directed    Diet Carb Modified   Complete by: As directed    Increase activity slowly   Complete by: As directed    No wound care  Complete by: As directed      Allergies as of 10/20/2020      Reactions    Penicillins Rash   ++ tolerates cefepime++ Has patient had a PCN reaction causing immediate rash, facial/tongue/throat swelling, SOB or lightheadedness with hypotension: Unknown Has patient had a PCN reaction causing severe rash involving mucus membranes or skin necrosis: Unknown Has patient had a PCN reaction that required hospitalization: No Has patient had a PCN reaction occurring within the last 10 years: No If all of the above answers are "NO", then may proceed with Cephalosporin use.   Latex    Metoclopramide Hcl    Neurontin [gabapentin]       Medication List    STOP taking these medications   CALCIUM CITRATE PO   glipiZIDE 10 MG tablet Commonly known as: GLUCOTROL   glipiZIDE XL 10 MG 24 hr tablet Generic drug: glipiZIDE   metFORMIN 500 MG tablet Commonly known as: GLUCOPHAGE   QUEtiapine 25 MG tablet Commonly known as: SEROQUEL   vitamin A 10000 UNIT capsule   VITAMIN B 12 PO   Vitamin D (Ergocalciferol) 1.25 MG (50000 UNIT) Caps capsule Commonly known as: DRISDOL   Voltaren 1 % Gel Generic drug: diclofenac sodium     TAKE these medications   acetaminophen 500 MG tablet Commonly known as: TYLENOL Take 1 tablet (500 mg total) by mouth every 6 (six) hours as needed for mild pain.   aspirin 81 MG tablet Take 81 mg by mouth daily.   BAZA PROTECT EX Apply 1 application topically. Apply to buttock topically three times a day for skin protection   blood glucose meter kit and supplies Kit Dispense based on patient and insurance preference.  Check blood sugar twice daily as directed.   buPROPion 150 MG 24 hr tablet Commonly known as: WELLBUTRIN XL Take 150 mg by mouth daily.   CALCIUM CITRATE MAXIMUM/VIT D PO Take 1 tablet by mouth at bedtime. Give 1 tablet by mouth   DULoxetine 60 MG capsule Commonly known as: CYMBALTA Take 60 mg by mouth daily.   glucose blood test strip 1 each by Other route 2 (two) times daily. Dispense based on Insurance and  patient preference.  Check blood sugars twice daily as instructed.   Iron (Ferrous Sulfate) 325 (65 Fe) MG Tabs Take 325 mg by mouth daily.   multivitamin with minerals tablet Take 1 tablet by mouth daily.   nitroGLYCERIN 0.4 MG/SPRAY spray Commonly known as: NITROLINGUAL USE ONE TO TWO SPRAYS UNDER THE TONGUE EVERY 5 MINUTES AS NEEDED FOR CHEST PAIN   OLANZapine 2.5 MG tablet Commonly known as: ZYPREXA Take 1 tablet (2.5 mg total) by mouth at bedtime.   omeprazole 40 MG capsule Commonly known as: PRILOSEC Take 40 mg by mouth daily.   vitamin E 180 MG (400 UNITS) capsule Generic drug: vitamin E Take 400 Units by mouth daily.       Follow-up Information    Roetta Sessions, NP. Schedule an appointment as soon as possible for a visit in 1 week(s).   Specialty: Nurse Practitioner Contact information: [address] [place] [address] [phone]              Allergies  Allergen Reactions  . Penicillins Rash    ++ tolerates cefepime++ Has patient had a PCN reaction causing immediate rash, facial/tongue/throat swelling, SOB or lightheadedness with hypotension: Unknown Has patient had a PCN reaction causing severe rash involving mucus membranes or skin necrosis:  Unknown Has patient had a PCN reaction that required hospitalization: No Has patient had a PCN reaction occurring within the last 10 years: No If all of the above answers are "NO", then may proceed with Cephalosporin use.   . Latex   . Metoclopramide Hcl   . Neurontin [Gabapentin]     Consultations:  Psychiatry.  Palliative care evaluation pending   Procedures/Studies: CT Head Wo Contrast  Result Date: 10/17/2020 CLINICAL DATA:  Mental status change.  Recent fall. EXAM: CT HEAD WITHOUT CONTRAST TECHNIQUE: Contiguous axial images were obtained from the base of the skull through the vertex without intravenous contrast. COMPARISON:  10/15/2020. FINDINGS: Brain: Interval development  of a low-density, approximately 7 mm thick subdural fluid collection along the left cerebral convexity. Mild associated mass effect with approximately 3 mm of rightward midline shift. Remote right frontal infarct. Similar generalized cerebral atrophy and chronic microvascular ischemic change. No hydrocephalus. No mass lesion. No evidence of acute hemorrhage. No evidence of acute large vascular territory infarct. Vascular: Calcific atherosclerosis. Skull: No acute fracture. Sinuses/Orbits: Mucosal thickening and opacification of anterior right ethmoid air cells. Unremarkable orbits. Other: No mastoid effusions. IMPRESSION: Interval development of a low-density, approximately 7 mm thick subdural fluid collection along the left cerebral convexity. Mild associated mass effect with 3 mm of rightward midline shift. Electronically Signed   By: Margaretha Sheffield MD   On: 10/17/2020 13:11   CT Head Wo Contrast  Result Date: 10/15/2020 CLINICAL DATA:  Unwitnessed fall EXAM: CT HEAD WITHOUT CONTRAST TECHNIQUE: Contiguous axial images were obtained from the base of the skull through the vertex without intravenous contrast. COMPARISON:  09/01/2017 FINDINGS: Brain: Old right frontal infarct. There is atrophy and chronic small vessel disease changes. No acute intracranial abnormality. Specifically, no hemorrhage, hydrocephalus, mass lesion, acute infarction, or significant intracranial injury. Vascular: No hyperdense vessel or unexpected calcification. Skull: No acute calvarial abnormality. Sinuses/Orbits: No acute findings Other: None IMPRESSION: Old right frontal infarct. Atrophy, chronic microvascular disease. No acute intracranial abnormality. Electronically Signed   By: Rolm Baptise M.D.   On: 10/15/2020 19:05   CT Cervical Spine Wo Contrast  Result Date: 10/15/2020 CLINICAL DATA:  Unwitnessed fall EXAM: CT CERVICAL SPINE WITHOUT CONTRAST TECHNIQUE: Multidetector CT imaging of the cervical spine was performed  without intravenous contrast. Multiplanar CT image reconstructions were also generated. COMPARISON:  None. FINDINGS: Alignment: No subluxation. Skull base and vertebrae: No acute fracture. No primary bone lesion or focal pathologic process. Soft tissues and spinal canal: No prevertebral fluid or swelling. No visible canal hematoma. Disc levels: Disc spaces are maintained. Diffuse degenerative facet disease bilaterally. Upper chest: No acute findings Other: None IMPRESSION: No acute bony abnormality. Electronically Signed   By: Rolm Baptise M.D.   On: 10/15/2020 19:07   DG Chest Port 1 View  Result Date: 10/17/2020 CLINICAL DATA:  Shortness of breath. EXAM: PORTABLE CHEST 1 VIEW COMPARISON:  September 01, 2017. FINDINGS: The heart size and mediastinal contours are within normal limits. No pneumothorax or pleural effusion is noted. Status post coronary bypass graft. Both lungs are clear. The visualized skeletal structures are unremarkable. IMPRESSION: No active disease. Electronically Signed   By: Marijo Conception M.D.   On: 10/17/2020 12:45       Subjective: Patient seen and examined at bedside.  Poor historian.  Awake but slightly confused to time.  Does not participate in conversation much.  Patient was slightly more agitated yesterday afternoon but did not require any as needed Haldol as per  nursing staff.  No overnight fever or vomiting reported.  Discharge Exam: Vitals:   10/19/20 1958 10/20/20 0542  BP: (!) 107/53 127/73  Pulse: 77 91  Resp: 20 18  Temp: 97.7 F (36.5 C) 97.6 F (36.4 C)  SpO2: 100% 100%    General exam: Currently appears calm and in no distress.  Chronically ill looking, elderly female lying in bed.  Very poor historian and hardly participates in any conversation. Respiratory system: Bilateral decreased breath sounds at bases with no wheezing cardiovascular system: Currently rate controlled, S1-S2 heard Gastrointestinal system: Abdomen is nondistended, soft and  nontender.  Bowel sounds are heard  extremities: Mild lower extremity edema present.  No clubbing     The results of significant diagnostics from this hospitalization (including imaging, microbiology, ancillary and laboratory) are listed below for reference.     Microbiology: Recent Results (from the past 240 hour(s))  Respiratory Panel by RT PCR (Flu A&B, Covid) - Nasopharyngeal Swab     Status: None   Collection Time: 10/17/20  1:45 PM   Specimen: Nasopharyngeal Swab  Result Value Ref Range Status   SARS Coronavirus 2 by RT PCR NEGATIVE NEGATIVE Final    Comment: (NOTE) SARS-CoV-2 target nucleic acids are NOT DETECTED.  The SARS-CoV-2 RNA is generally detectable in upper respiratoy specimens during the acute phase of infection. The lowest concentration of SARS-CoV-2 viral copies this assay can detect is 131 copies/mL. A negative result does not preclude SARS-Cov-2 infection and should not be used as the sole basis for treatment or other patient management decisions. A negative result may occur with  improper specimen collection/handling, submission of specimen other than nasopharyngeal swab, presence of viral mutation(s) within the areas targeted by this assay, and inadequate number of viral copies (<131 copies/mL). A negative result must be combined with clinical observations, patient history, and epidemiological information. The expected result is Negative.  Fact Sheet for Patients:  PinkCheek.be  Fact Sheet for Healthcare Providers:  GravelBags.it  This test is no t yet approved or cleared by the [place] FDA and  has been authorized for detection and/or diagnosis of SARS-CoV-2 by FDA under an Emergency Use Authorization (EUA). This EUA will remain  in effect (meaning this test can be used) for the duration of the COVID-19 declaration under Section 564(b)(1) of the Act, 21 U.S.C. section 360bbb-3(b)(1),  unless the authorization is terminated or revoked sooner.     Influenza A by PCR NEGATIVE NEGATIVE Final   Influenza B by PCR NEGATIVE NEGATIVE Final    Comment: (NOTE) The Xpert Xpress SARS-CoV-2/FLU/RSV assay is intended as an aid in  the diagnosis of influenza from Nasopharyngeal swab specimens and  should not be used as a sole basis for treatment. Nasal washings and  aspirates are unacceptable for Xpert Xpress SARS-CoV-2/FLU/RSV  testing.  Fact Sheet for Patients: PinkCheek.be  Fact Sheet for Healthcare Providers: GravelBags.it  This test is not yet approved or cleared by the [place] FDA and  has been authorized for detection and/or diagnosis of SARS-CoV-2 by  FDA under an Emergency Use Authorization (EUA). This EUA will remain  in effect (meaning this test can be used) for the duration of the  Covid-19 declaration under Section 564(b)(1) of the Act, 21  U.S.C. section 360bbb-3(b)(1), unless the authorization is  terminated or revoked. Performed at [facility], [place] [address] [place], [place] [address]   Culture, blood (routine x 2)     Status: None (Preliminary result)   Collection  Time: 10/17/20  1:45 PM   Specimen: BLOOD  Result Value Ref Range Status   Specimen Description   Final    BLOOD LEFT ANTECUBITAL Performed at [facility] [address], [place], [place] [address]    Special Requests   Final    BOTTLES DRAWN AEROBIC AND ANAEROBIC Blood Culture adequate volume Performed at [facility] [address] [place], [place] [address]    Culture   Final    NO GROWTH 3 DAYS Performed at [facility], [place] [address], [place], [place] [address]    Report Status PENDING  Incomplete  Culture, blood (routine x 2)     Status: None (Preliminary result)   Collection Time: 10/17/20  1:46 PM   Specimen: BLOOD LEFT FOREARM  Result Value Ref  Range Status   Specimen Description   Final    BLOOD LEFT FOREARM Performed at [facility] [address], [place], [place] [address]    Special Requests   Final    BOTTLES DRAWN AEROBIC AND ANAEROBIC Blood Culture adequate volume Performed at [facility] [address], [place], [place] [address]    Culture   Final    NO GROWTH 3 DAYS Performed at [facility], [place] [address], [place], [place] [address]    Report Status PENDING  Incomplete  Culture, Urine     Status: Abnormal   Collection Time: 10/18/20  6:31 PM   Specimen: Urine, Clean Catch  Result Value Ref Range Status   Specimen Description   Final    URINE, CLEAN CATCH Performed at [facility], [place] [address] [place], [place] [address]    Special Requests   Final    NONE Performed at [facility], [place] [address] [place], [place] [address]    Culture (A)  Final    <10,000 COLONIES/mL INSIGNIFICANT GROWTH Performed at [facility] [address], [place], [place] [address]    Report Status 10/19/2020 FINAL  Final  SARS Coronavirus 2 by RT PCR (hospital order, performed in [facility] hospital lab) Nasopharyngeal Nasopharyngeal Swab     Status: None   Collection Time: 10/19/20  5:45 PM   Specimen: Nasopharyngeal Swab  Result Value Ref Range Status   SARS Coronavirus 2 NEGATIVE NEGATIVE Final    Comment: (NOTE) SARS-CoV-2 target nucleic acids are NOT DETECTED.  The SARS-CoV-2 RNA is generally detectable in upper and lower respiratory specimens during the acute phase of infection. The lowest concentration of SARS-CoV-2 viral copies this assay can detect is 250 copies / mL. A negative result does not preclude SARS-CoV-2 infection and should not be used as the sole basis for treatment or other patient management decisions.  A negative result may occur with improper specimen collection / handling, submission of specimen  other than nasopharyngeal swab, presence of viral mutation(s) within the areas targeted by this assay, and inadequate number of viral copies (<250 copies / mL). A negative result must be combined with clinical observations, patient history, and epidemiological information.  Fact Sheet for Patients:   StrictlyIdeas.no  Fact Sheet for Healthcare Providers: BankingDealers.co.za  This test is not yet approved or  cleared by the [place] FDA and has been authorized for detection and/or diagnosis of SARS-CoV-2 by FDA under an Emergency Use Authorization (EUA).  This EUA will remain in effect (meaning this test can be used) for the duration of the COVID-19 declaration under  Section 564(b)(1) of the Act, 21 U.S.C. section 360bbb-3(b)(1), unless the authorization is terminated or revoked sooner.  Performed at [facility], [place] [address], [place], [place] [address]      Labs: BNP (last 3 results) Recent Labs    10/17/20 1147  BNP 41.7   Basic Metabolic Panel: Recent Labs  Lab 10/17/20 1147 10/17/20 1529 10/18/20 0500 10/18/20 0850 10/19/20 0525  NA 140  --  134*  --  137  K 3.8  --  3.4*  --  3.2*  CL 104  --  101  --  108  CO2 23  --  23  --  24  GLUCOSE 39*  --  78  --  127*  BUN 26*  --  19  --  15  CREATININE 1.10* 1.12* 0.82  --  0.69  CALCIUM 7.6*  --  7.3*  --  6.8*  MG  --  1.5*  --  1.9 1.6*  PHOS  --  3.5  --   --  2.7   Liver Function Tests: Recent Labs  Lab 10/17/20 1147  AST 31  ALT 32  ALKPHOS 67  BILITOT 1.3*  PROT 4.4*  ALBUMIN 2.0*   No results for input(s): LIPASE, AMYLASE in the last 168 hours. Recent Labs  Lab 10/17/20 1147  AMMONIA 16   CBC: Recent Labs  Lab 10/17/20 1147 10/17/20 1529 10/18/20 0500 10/19/20 0525  WBC 8.3 9.2 7.9 6.6  NEUTROABS 6.3  --   --  4.4  HGB 9.6* 9.0* 9.2* 7.9*  HCT 28.9* 27.4* 26.3* 24.0*  MCV 99.0 99.3 95.3 98.0  PLT 274 245 263  208   Cardiac Enzymes: No results for input(s): CKTOTAL, CKMB, CKMBINDEX, TROPONINI in the last 168 hours. BNP: Invalid input(s): POCBNP CBG: Recent Labs  Lab 10/19/20 1119 10/19/20 2003 10/20/20 0000 10/20/20 0431 10/20/20 0741  GLUCAP 158* 149* 129* 77 80   D-Dimer No results for input(s): DDIMER in the last 72 hours. Hgb A1c Recent Labs    10/19/20 0525  HGBA1C 4.8   Lipid Profile Recent Labs    10/19/20 0525  CHOL 61  HDL 13*  LDLCALC 34  TRIG 72  CHOLHDL 4.7   Thyroid function studies No results for input(s): TSH, T4TOTAL, T3FREE, THYROIDAB in the last 72 hours.  Invalid input(s): FREET3 Anemia work up Recent Labs    10/18/20 0500  VITAMINB12 1,241*   Urinalysis    Component Value Date/Time   COLORURINE AMBER (A) 10/17/2020 1344   APPEARANCEUR HAZY (A) 10/17/2020 1344   APPEARANCEUR Cloudy 01/16/2015 1515   LABSPEC 1.019 10/17/2020 1344   LABSPEC 1.014 01/16/2015 1515   PHURINE 5.0 10/17/2020 1344   GLUCOSEU NEGATIVE 10/17/2020 1344   GLUCOSEU Negative 01/16/2015 1515   HGBUR NEGATIVE 10/17/2020 1344   BILIRUBINUR NEGATIVE 10/17/2020 1344   BILIRUBINUR Negative 01/16/2015 1515   KETONESUR NEGATIVE 10/17/2020 1344   PROTEINUR NEGATIVE 10/17/2020 1344   UROBILINOGEN 0.2 03/30/2014 1415   NITRITE NEGATIVE 10/17/2020 1344   LEUKOCYTESUR SMALL (A) 10/17/2020 1344   LEUKOCYTESUR 2+ 01/16/2015 1515   Sepsis Labs Invalid input(s): PROCALCITONIN,  WBC,  LACTICIDVEN Microbiology Recent Results (from the past 240 hour(s))  Respiratory Panel by RT PCR (Flu A&B, Covid) - Nasopharyngeal Swab     Status: None   Collection Time: 10/17/20  1:45 PM   Specimen: Nasopharyngeal Swab  Result Value Ref Range Status   SARS Coronavirus 2 by RT PCR NEGATIVE NEGATIVE Final  Comment: (NOTE) SARS-CoV-2 target nucleic acids are NOT DETECTED.  The SARS-CoV-2 RNA is generally detectable in upper respiratoy specimens during the acute phase of infection. The  lowest concentration of SARS-CoV-2 viral copies this assay can detect is 131 copies/mL. A negative result does not preclude SARS-Cov-2 infection and should not be used as the sole basis for treatment or other patient management decisions. A negative result may occur with  improper specimen collection/handling, submission of specimen other than nasopharyngeal swab, presence of viral mutation(s) within the areas targeted by this assay, and inadequate number of viral copies (<131 copies/mL). A negative result must be combined with clinical observations, patient history, and epidemiological information. The expected result is Negative.  Fact Sheet for Patients:  PinkCheek.be  Fact Sheet for Healthcare Providers:  GravelBags.it  This test is no t yet approved or cleared by the [place] FDA and  has been authorized for detection and/or diagnosis of SARS-CoV-2 by FDA under an Emergency Use Authorization (EUA). This EUA will remain  in effect (meaning this test can be used) for the duration of the COVID-19 declaration under Section 564(b)(1) of the Act, 21 U.S.C. section 360bbb-3(b)(1), unless the authorization is terminated or revoked sooner.     Influenza A by PCR NEGATIVE NEGATIVE Final   Influenza B by PCR NEGATIVE NEGATIVE Final    Comment: (NOTE) The Xpert Xpress SARS-CoV-2/FLU/RSV assay is intended as an aid in  the diagnosis of influenza from Nasopharyngeal swab specimens and  should not be used as a sole basis for treatment. Nasal washings and  aspirates are unacceptable for Xpert Xpress SARS-CoV-2/FLU/RSV  testing.  Fact Sheet for Patients: PinkCheek.be  Fact Sheet for Healthcare Providers: GravelBags.it  This test is not yet approved or cleared by the [place] FDA and  has been authorized for detection and/or diagnosis of SARS-CoV-2 by  FDA under  an Emergency Use Authorization (EUA). This EUA will remain  in effect (meaning this test can be used) for the duration of the  Covid-19 declaration under Section 564(b)(1) of the Act, 21  U.S.C. section 360bbb-3(b)(1), unless the authorization is  terminated or revoked. Performed at [facility], [place] [address], [place], [place] [address]   Culture, blood (routine x 2)     Status: None (Preliminary result)   Collection Time: 10/17/20  1:45 PM   Specimen: BLOOD  Result Value Ref Range Status   Specimen Description   Final    BLOOD LEFT ANTECUBITAL Performed at [facility] [address], [place], [place] [address]    Special Requests   Final    BOTTLES DRAWN AEROBIC AND ANAEROBIC Blood Culture adequate volume Performed at [facility] [address] [place], [place] [address]    Culture   Final    NO GROWTH 3 DAYS Performed at [facility], [place] [address] [place], [place] [address]    Report Status PENDING  Incomplete  Culture, blood (routine x 2)     Status: None (Preliminary result)   Collection Time: 10/17/20  1:46 PM   Specimen: BLOOD LEFT FOREARM  Result Value Ref Range Status   Specimen Description   Final    BLOOD LEFT FOREARM Performed at [facility] [address] [place], [place] [address]    Special Requests   Final    BOTTLES DRAWN AEROBIC AND ANAEROBIC Blood Culture adequate volume Performed at [facility] [address] [place], [place] [address]  Culture   Final    NO GROWTH 3 DAYS Performed at [facility], [place] [address] [place], [place] [address]    Report Status PENDING  Incomplete  Culture, Urine     Status: Abnormal   Collection Time: 10/18/20  6:31 PM   Specimen: Urine, Clean Catch  Result Value Ref Range Status   Specimen Description   Final    URINE, CLEAN CATCH Performed at [facility], [place] [address], [place], [place] [address]    Special Requests   Final    NONE Performed at [facility], [place] [address] [place], [place] [address]    Culture (A)  Final    <10,000 COLONIES/mL INSIGNIFICANT GROWTH Performed at [place] [address], [place], [place] [address]    Report Status 10/19/2020 FINAL  Final  SARS Coronavirus 2 by RT PCR (hospital order, performed in [facility] hospital lab) Nasopharyngeal Nasopharyngeal Swab     Status: None   Collection Time: 10/19/20  5:45 PM   Specimen: Nasopharyngeal Swab  Result Value Ref Range Status   SARS Coronavirus 2 NEGATIVE NEGATIVE Final    Comment: (NOTE) SARS-CoV-2 target nucleic acids are NOT DETECTED.  The SARS-CoV-2 RNA is generally detectable in upper and lower respiratory specimens during the acute phase of infection. The lowest concentration of SARS-CoV-2 viral copies this assay can detect is 250 copies / mL. A negative result does not preclude SARS-CoV-2 infection and should not be used as the sole basis for treatment or other patient management decisions.  A negative result may occur with improper specimen collection / handling, submission of specimen other than nasopharyngeal swab, presence of viral mutation(s) within the areas targeted by this assay, and inadequate number of viral copies (<250 copies / mL). A negative result must be combined with clinical observations, patient history, and epidemiological information.  Fact Sheet for Patients:   StrictlyIdeas.no  Fact Sheet for Healthcare Providers: BankingDealers.co.za  This test is not yet approved or  cleared by the [place] FDA and has been authorized for detection and/or diagnosis of SARS-CoV-2 by FDA under an Emergency Use Authorization (EUA).  This EUA will remain in effect (meaning this test can be used) for the duration of the COVID-19 declaration under Section 564(b)(1) of the Act,  21 U.S.C. section 360bbb-3(b)(1), unless the authorization is terminated or revoked sooner.  Performed at [facility], [place] [address], [place], [place] [address]      Time coordinating discharge: 35 minutes  SIGNED:   Aline August, MD  [facility] 10/20/2020, 11:11 AM

## 2020-10-22 LAB — CULTURE, BLOOD (ROUTINE X 2)
Culture: NO GROWTH
Culture: NO GROWTH
Special Requests: ADEQUATE
Special Requests: ADEQUATE

## 2020-11-03 ENCOUNTER — Other Ambulatory Visit: Payer: Self-pay

## 2020-11-03 ENCOUNTER — Non-Acute Institutional Stay: Payer: Medicare Other | Admitting: Nurse Practitioner

## 2020-11-03 DIAGNOSIS — Z515 Encounter for palliative care: Secondary | ICD-10-CM

## 2020-11-03 DIAGNOSIS — L03119 Cellulitis of unspecified part of limb: Secondary | ICD-10-CM

## 2020-11-03 NOTE — Progress Notes (Signed)
[facility] Consult Note Telephone: [phone]  Fax: [phone]  PATIENT NAME: [name] [address] [place] [id number] (home)  DOB: 1951/07/05 MRN: [id number]  PRIMARY CARE PROVIDER:    Roetta Sessions, NP,  [place] [address] [place] [address] [phone]  REFERRING PROVIDER:   Roetta Sessions, NP [address] [place],  [place] [address] [phone]  RESPONSIBLE PARTY:   Extended Emergency Contact Information Primary Emergency Contact: Griffin,Jeff Address: [address]  [place]te Litter of [place] Mobile Phone: [phone] Relation: Brother Secondary Emergency Contact: Benetta Spar Mobile Phone: [phone] Relation: Brother  I met face to face with patient in facility, patient unable to substantively engage in process due to poor cognition secondary to advanced dementia.   ASSESSMENT AND RECOMMENDATIONS:   1. Advance Care Planning: Goal of care: Patient unable to participate in discussion. Directives: Patient code status is Full Code. Unable to reach her brother to review advance directives, called 3 times.  2. Symptom Management:  Areas of redness and warmth noted to right lower leg, posterior left hand, and right forearm. Posterior left hand with open draining wound. Sites tender to touch. No report of fever, chills, or SOB. Discussed with facility nurse Kirke Shaggy, report patient has been evaluated by her PCP, order for Bactrim was received for cellulitis.  Recommendation: patient with weeping skin and +3 edema to BLE. If weight gain persist, consider low dose diuretic for 4- 7 days. Consider monitoring weight more frequently.  Type 2 diabetes: Patient noted to be hypoglycemic during last hospitalization (10/17/2020 to 11/4/202). CBG was found to be 29, was on Glipizide and Metformin at the time. All hypoglycemics was stopped. Per facility records,  CBG range since hospital discharge is [phone], A1c 5.1 on 09/20/2020.  Recommendation: Continue current plan of care, liberalize diet to encourage adequate meal intake. Monitor for need to restart med as indicated. Goal A1c should be more relaxed. Palliative care will continue to provided support to patient, family and medical team. Due to patient's complex disease issues, patient is at increased risk for skin breakdown with infection, gradual weight loss, dysphagia, aspiration, vascular compromise leading to repeat CVA, MI, death. These risk will remain and will progress over time due to advancing age.  3. Follow up Palliative Care Visit: Palliative care will continue to follow for goals of care clarification and symptom management. Return in about 4- 6 weeks or prn.  4. Family /Caregiver/Community Supports: Patient is a resident of a [facility] section of [facility].   5. Cognitive / Functional decline: Patient awake and alert, pleasantly confused, totally dependent on staff for all of her ADLs, able to feed self. Patient is wheel chair dependent for Ambulation.  I spent 48 minutes providing this consultation, time includes time spent with patient and staff, chart review, provider coordination, and documentation. More than 50% of the time in this consultation was spent coordinating communication.   HISTORY OF PRESENT ILLNESS:  [name] is a 69 y.o. year old female with multiple medical problems including Dementia (FAST 7a) with behavioral concerns, type 2 diabetes, recurrent UTI, chronic CHF, AVNRT s/p ablation, hyperlipidemia, hx of bariatric surgery. Patient was hospitalized from 10/17/2020 to 10/20/2020 for hypoglycemia and altered mental status change. Palliative Care was asked to follow this patient by consultation request of Roetta Sessions* to help address advance care planning and goals of care. This is an  initial visit.  CODE STATUS: Full  Code  PPS: 30%  HOSPICE ELIGIBILITY/DIAGNOSIS: TBD  PHYSICAL EXAM / ROS:   Current and past weights: 140lbs slightly up from 139.4lbs at last month, was 149lbs on admission to the facility. General: NAD, frail appearing, sitting in wheel chair in living area in NAD Cardiovascular: no chest pain reported, +3 edema, venous stasis noted to bilateral lower legs Pulmonary: no cough, no SOB, room air GI:  appetite fair, no report of constipation, incontinent of bowel GU: no report of dysuria, incontinent of urine MSK:  no joint and ROM abnormalities, non-ambulatory Skin: skin weeping, area of redness and warmth noted to right lower leg, posterior left hand, and right forearm. Posterior left hand draining pus-like secretions  Neurological: weakness, confused  PAST MEDICAL HISTORY:  Past Medical History:  Diagnosis Date  . Allergy   . Anxiety   . Arthritis   . Asthma   . 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 Bilateral carotid artery occlusion 09/25/2013  . CAD s/p CABG 1994 09/25/2013   LIMA-LAD, SVG Diag, SVG-RCA, [facility] Cath 2010: Stents in the proximal LAD with 40% in-stent restenosis and 50% post-stent stenosis, 80% small OM 1, 70% small OM 2, 40% first PLA; diffuse 70% distal RCA; patent SVG to diagonal, patent SVG to distal RCA, patent atretic LIMA EF 55%; normal nuclear stress test 2011  . Cataract   . CHF (congestive heart failure) ([place])   . Chronic anemia   . Depression   . Diabetes mellitus without complication ([place])   . Diastolic dysfunction   . GERD (gastroesophageal reflux disease)   . Glaucoma   . Heart murmur   . History of recurrent UTIs   . Hyperlipidemia   . Hypertension   . Hypocalcemia   . Ischemic heart disease   . Migraine   . Morbid obesity ([place])    s/p gastric bypass  . Osteoporosis   . Peroneal neuropathy   . Retinopathy   . Stroke [facility])     SOCIAL HX:  Social History   Tobacco Use  . Smoking status: Former Smoker    Packs/day:  2.00    Quit date: 12/18/1987    Years since quitting: 32.9  . Smokeless tobacco: Never Used  . Tobacco comment: Pt unsure how many years she smoked total.  Substance Use Topics  . Alcohol use: No   FAMILY HX:  Family History  Problem Relation Age of Onset  . Diabetes Brother   . Asthma Brother   . Depression Brother   . Asthma Brother   . Depression Brother   . Diabetes Brother   . Cancer Father        skin  . Alcohol abuse Father   . Arthritis Father   . Depression Father   . Hypertension Father   . Alzheimer's disease Father   . Diabetes Mother   . Heart failure Mother   . Arthritis Mother   . Heart disease Mother   . Asthma Sister   . Heart attack Sister   . Depression Sister   . Diabetes Sister   . Drug abuse Sister   . Heart disease Sister   . Mental illness Sister   . Heart attack Son   . Hypertension Son   . Kidney disease Son        transplant 2003  . Vision loss Son     ALLERGIES:  Allergies  Allergen Reactions  . Penicillins Rash    ++ tolerates  cefepime++ Has patient had a PCN reaction causing immediate rash, facial/tongue/throat swelling, SOB or lightheadedness with hypotension: Unknown Has patient had a PCN reaction causing severe rash involving mucus membranes or skin necrosis: Unknown Has patient had a PCN reaction that required hospitalization: No Has patient had a PCN reaction occurring within the last 10 years: No If all of the above answers are "NO", then may proceed with Cephalosporin use.   . Latex   . Metoclopramide Hcl   . Neurontin [Gabapentin]      PERTINENT MEDICATIONS:  Outpatient Encounter Medications as of 11/03/2020  Medication Sig  . acetaminophen (TYLENOL) 500 MG tablet Take 1 tablet (500 mg total) by mouth every 6 (six) hours as needed for mild pain.  Marland Kitchen aspirin 81 MG tablet Take 81 mg by mouth daily.  . blood glucose meter kit and supplies KIT Dispense based on patient and insurance preference.  Check blood sugar twice  daily as directed.  Marland Kitchen buPROPion (WELLBUTRIN XL) 150 MG 24 hr tablet Take 150 mg by mouth daily.  . Calcium Citrate-Vitamin D (CALCIUM CITRATE MAXIMUM/VIT D PO) Take 1 tablet by mouth at bedtime. Give 1 tablet by mouth  . DULoxetine (CYMBALTA) 60 MG capsule Take 60 mg by mouth daily.  Marland Kitchen glucose blood test strip 1 each by Other route 2 (two) times daily. Dispense based on Insurance and patient preference.  Check blood sugars twice daily as instructed.  . Iron, Ferrous Sulfate, 325 (65 Fe) MG TABS Take 325 mg by mouth daily.   . Multiple Vitamins-Minerals (MULTIVITAMIN WITH MINERALS) tablet Take 1 tablet by mouth daily.  . nitroGLYCERIN (NITROLINGUAL) 0.4 MG/SPRAY spray USE ONE TO TWO SPRAYS UNDER THE TONGUE EVERY 5 MINUTES AS NEEDED FOR CHEST PAIN (Patient not taking: Reported on 10/17/2020)  . OLANZapine (ZYPREXA) 2.5 MG tablet Take 1 tablet (2.5 mg total) by mouth at bedtime.  Marland Kitchen omeprazole (PRILOSEC) 40 MG capsule Take 40 mg by mouth daily.  . Skin Protectants, Misc. (BAZA PROTECT EX) Apply 1 application topically. Apply to buttock topically three times a day for skin protection  . vitamin E (VITAMIN E) 180 MG (400 UNITS) capsule Take 400 Units by mouth daily.   No facility-administered encounter medications on file as of 11/03/2020.     Jari Favre, DNP, AGPCNP-BC

## 2020-11-07 ENCOUNTER — Emergency Department (HOSPITAL_COMMUNITY): Payer: Medicare Other

## 2020-11-07 ENCOUNTER — Encounter (HOSPITAL_COMMUNITY): Payer: Federal, State, Local not specified - PPO

## 2020-11-07 ENCOUNTER — Other Ambulatory Visit: Payer: Self-pay

## 2020-11-07 ENCOUNTER — Inpatient Hospital Stay (HOSPITAL_COMMUNITY)
Admission: EM | Admit: 2020-11-07 | Discharge: 2020-11-15 | DRG: 871 | Disposition: A | Discharge status: Hospice | Payer: Medicare Other | Source: Skilled Nursing Facility | Attending: Internal Medicine | Admitting: Internal Medicine

## 2020-11-07 ENCOUNTER — Telehealth: Payer: Self-pay | Admitting: Nurse Practitioner

## 2020-11-07 ENCOUNTER — Encounter (HOSPITAL_COMMUNITY): Payer: Self-pay | Admitting: Emergency Medicine

## 2020-11-07 DIAGNOSIS — L03116 Cellulitis of left lower limb: Secondary | ICD-10-CM | POA: Diagnosis present

## 2020-11-07 DIAGNOSIS — Z818 Family history of other mental and behavioral disorders: Secondary | ICD-10-CM

## 2020-11-07 DIAGNOSIS — J449 Chronic obstructive pulmonary disease, unspecified: Secondary | ICD-10-CM | POA: Diagnosis present

## 2020-11-07 DIAGNOSIS — Z825 Family history of asthma and other chronic lower respiratory diseases: Secondary | ICD-10-CM

## 2020-11-07 DIAGNOSIS — E119 Type 2 diabetes mellitus without complications: Secondary | ICD-10-CM

## 2020-11-07 DIAGNOSIS — Z7401 Bed confinement status: Secondary | ICD-10-CM | POA: Diagnosis not present

## 2020-11-07 DIAGNOSIS — E861 Hypovolemia: Secondary | ICD-10-CM | POA: Diagnosis present

## 2020-11-07 DIAGNOSIS — A419 Sepsis, unspecified organism: Secondary | ICD-10-CM | POA: Diagnosis present

## 2020-11-07 DIAGNOSIS — F419 Anxiety disorder, unspecified: Secondary | ICD-10-CM | POA: Diagnosis present

## 2020-11-07 DIAGNOSIS — Z821 Family history of blindness and visual loss: Secondary | ICD-10-CM

## 2020-11-07 DIAGNOSIS — Z91040 Latex allergy status: Secondary | ICD-10-CM

## 2020-11-07 DIAGNOSIS — G43909 Migraine, unspecified, not intractable, without status migrainosus: Secondary | ICD-10-CM | POA: Diagnosis present

## 2020-11-07 DIAGNOSIS — R627 Adult failure to thrive: Secondary | ICD-10-CM | POA: Diagnosis present

## 2020-11-07 DIAGNOSIS — K219 Gastro-esophageal reflux disease without esophagitis: Secondary | ICD-10-CM | POA: Diagnosis present

## 2020-11-07 DIAGNOSIS — R7401 Elevation of levels of liver transaminase levels: Secondary | ICD-10-CM | POA: Diagnosis present

## 2020-11-07 DIAGNOSIS — Z20822 Contact with and (suspected) exposure to covid-19: Secondary | ICD-10-CM | POA: Diagnosis present

## 2020-11-07 DIAGNOSIS — R54 Age-related physical debility: Secondary | ICD-10-CM | POA: Diagnosis present

## 2020-11-07 DIAGNOSIS — N179 Acute kidney failure, unspecified: Secondary | ICD-10-CM | POA: Diagnosis present

## 2020-11-07 DIAGNOSIS — R6520 Severe sepsis without septic shock: Secondary | ICD-10-CM | POA: Diagnosis present

## 2020-11-07 DIAGNOSIS — E860 Dehydration: Secondary | ICD-10-CM | POA: Diagnosis not present

## 2020-11-07 DIAGNOSIS — F0390 Unspecified dementia without behavioral disturbance: Secondary | ICD-10-CM | POA: Diagnosis present

## 2020-11-07 DIAGNOSIS — Z96641 Presence of right artificial hip joint: Secondary | ICD-10-CM | POA: Diagnosis present

## 2020-11-07 DIAGNOSIS — Z87440 Personal history of urinary (tract) infections: Secondary | ICD-10-CM | POA: Diagnosis not present

## 2020-11-07 DIAGNOSIS — I5032 Chronic diastolic (congestive) heart failure: Secondary | ICD-10-CM | POA: Diagnosis present

## 2020-11-07 DIAGNOSIS — Z888 Allergy status to other drugs, medicaments and biological substances status: Secondary | ICD-10-CM | POA: Diagnosis not present

## 2020-11-07 DIAGNOSIS — M810 Age-related osteoporosis without current pathological fracture: Secondary | ICD-10-CM | POA: Diagnosis present

## 2020-11-07 DIAGNOSIS — Z880 Allergy status to penicillin: Secondary | ICD-10-CM

## 2020-11-07 DIAGNOSIS — Z6825 Body mass index (BMI) 25.0-25.9, adult: Secondary | ICD-10-CM | POA: Diagnosis not present

## 2020-11-07 DIAGNOSIS — Z7982 Long term (current) use of aspirin: Secondary | ICD-10-CM

## 2020-11-07 DIAGNOSIS — Z8261 Family history of arthritis: Secondary | ICD-10-CM

## 2020-11-07 DIAGNOSIS — I110 Hypertensive heart disease with heart failure: Secondary | ICD-10-CM | POA: Diagnosis present

## 2020-11-07 DIAGNOSIS — F32A Depression, unspecified: Secondary | ICD-10-CM | POA: Diagnosis present

## 2020-11-07 DIAGNOSIS — E43 Unspecified severe protein-calorie malnutrition: Secondary | ICD-10-CM | POA: Diagnosis present

## 2020-11-07 DIAGNOSIS — Z66 Do not resuscitate: Secondary | ICD-10-CM | POA: Diagnosis present

## 2020-11-07 DIAGNOSIS — Z813 Family history of other psychoactive substance abuse and dependence: Secondary | ICD-10-CM

## 2020-11-07 DIAGNOSIS — Z8249 Family history of ischemic heart disease and other diseases of the circulatory system: Secondary | ICD-10-CM

## 2020-11-07 DIAGNOSIS — E785 Hyperlipidemia, unspecified: Secondary | ICD-10-CM | POA: Diagnosis present

## 2020-11-07 DIAGNOSIS — E11649 Type 2 diabetes mellitus with hypoglycemia without coma: Secondary | ICD-10-CM | POA: Diagnosis present

## 2020-11-07 DIAGNOSIS — I2510 Atherosclerotic heart disease of native coronary artery without angina pectoris: Secondary | ICD-10-CM | POA: Diagnosis present

## 2020-11-07 DIAGNOSIS — G9341 Metabolic encephalopathy: Secondary | ICD-10-CM | POA: Diagnosis present

## 2020-11-07 DIAGNOSIS — R000 Tachycardia, unspecified: Secondary | ICD-10-CM | POA: Diagnosis present

## 2020-11-07 DIAGNOSIS — I959 Hypotension, unspecified: Secondary | ICD-10-CM | POA: Diagnosis present

## 2020-11-07 DIAGNOSIS — E876 Hypokalemia: Secondary | ICD-10-CM | POA: Diagnosis present

## 2020-11-07 DIAGNOSIS — R7989 Other specified abnormal findings of blood chemistry: Secondary | ICD-10-CM | POA: Diagnosis present

## 2020-11-07 DIAGNOSIS — R0682 Tachypnea, not elsewhere classified: Secondary | ICD-10-CM | POA: Diagnosis present

## 2020-11-07 DIAGNOSIS — Z833 Family history of diabetes mellitus: Secondary | ICD-10-CM

## 2020-11-07 DIAGNOSIS — Z820 Family history of epilepsy and other diseases of the nervous system: Secondary | ICD-10-CM

## 2020-11-07 DIAGNOSIS — Z951 Presence of aortocoronary bypass graft: Secondary | ICD-10-CM

## 2020-11-07 DIAGNOSIS — R410 Disorientation, unspecified: Secondary | ICD-10-CM

## 2020-11-07 DIAGNOSIS — M1990 Unspecified osteoarthritis, unspecified site: Secondary | ICD-10-CM | POA: Diagnosis present

## 2020-11-07 DIAGNOSIS — Z87891 Personal history of nicotine dependence: Secondary | ICD-10-CM

## 2020-11-07 DIAGNOSIS — Z811 Family history of alcohol abuse and dependence: Secondary | ICD-10-CM

## 2020-11-07 DIAGNOSIS — Z8673 Personal history of transient ischemic attack (TIA), and cerebral infarction without residual deficits: Secondary | ICD-10-CM

## 2020-11-07 DIAGNOSIS — E11319 Type 2 diabetes mellitus with unspecified diabetic retinopathy without macular edema: Secondary | ICD-10-CM | POA: Diagnosis present

## 2020-11-07 LAB — CBC WITH DIFFERENTIAL/PLATELET
Abs Immature Granulocytes: 0.15 10*3/uL — ABNORMAL HIGH (ref 0.00–0.07)
Basophils Absolute: 0 10*3/uL (ref 0.0–0.1)
Basophils Relative: 0 %
Eosinophils Absolute: 0 10*3/uL (ref 0.0–0.5)
Eosinophils Relative: 0 %
HCT: 30 % — ABNORMAL LOW (ref 36.0–46.0)
Hemoglobin: 9.4 g/dL — ABNORMAL LOW (ref 12.0–15.0)
Immature Granulocytes: 1 %
Lymphocytes Relative: 8 %
Lymphs Abs: 1.5 10*3/uL (ref 0.7–4.0)
MCH: 30.5 pg (ref 26.0–34.0)
MCHC: 31.3 g/dL (ref 30.0–36.0)
MCV: 97.4 fL (ref 80.0–100.0)
Monocytes Absolute: 0.7 10*3/uL (ref 0.1–1.0)
Monocytes Relative: 4 %
Neutro Abs: 16.9 10*3/uL — ABNORMAL HIGH (ref 1.7–7.7)
Neutrophils Relative %: 87 %
Platelets: 285 10*3/uL (ref 150–400)
RBC: 3.08 MIL/uL — ABNORMAL LOW (ref 3.87–5.11)
RDW: 14 % (ref 11.5–15.5)
WBC: 19.2 10*3/uL — ABNORMAL HIGH (ref 4.0–10.5)
nRBC: 0.1 % (ref 0.0–0.2)

## 2020-11-07 LAB — COMPREHENSIVE METABOLIC PANEL
ALT: 45 U/L — ABNORMAL HIGH (ref 0–44)
AST: 45 U/L — ABNORMAL HIGH (ref 15–41)
Albumin: 1.6 g/dL — ABNORMAL LOW (ref 3.5–5.0)
Alkaline Phosphatase: 105 U/L (ref 38–126)
Anion gap: 14 (ref 5–15)
BUN: 37 mg/dL — ABNORMAL HIGH (ref 8–23)
CO2: 17 mmol/L — ABNORMAL LOW (ref 22–32)
Calcium: 7.6 mg/dL — ABNORMAL LOW (ref 8.9–10.3)
Chloride: 109 mmol/L (ref 98–111)
Creatinine, Ser: 2.09 mg/dL — ABNORMAL HIGH (ref 0.44–1.00)
GFR, Estimated: 25 mL/min — ABNORMAL LOW (ref >60)
Glucose, Bld: 174 mg/dL — ABNORMAL HIGH (ref 70–99)
Potassium: 3.3 mmol/L — ABNORMAL LOW (ref 3.5–5.1)
Sodium: 140 mmol/L (ref 135–145)
Total Bilirubin: 1.3 mg/dL — ABNORMAL HIGH (ref 0.3–1.2)
Total Protein: 3.9 g/dL — ABNORMAL LOW (ref 6.5–8.1)

## 2020-11-07 LAB — LACTIC ACID, PLASMA
Lactic Acid, Venous: 2.5 mmol/L (ref 0.5–1.9)
Lactic Acid, Venous: 3.7 mmol/L (ref 0.5–1.9)
Lactic Acid, Venous: 2.9 mmol/L (ref 0.5–1.9)
Lactic Acid, Venous: 2.2 mmol/L (ref 0.5–1.9)

## 2020-11-07 LAB — RESPIRATORY PANEL BY RT PCR (FLU A&B, COVID)
Influenza A by PCR: NEGATIVE
Influenza B by PCR: NEGATIVE
SARS Coronavirus 2 by RT PCR: NEGATIVE

## 2020-11-07 LAB — URINALYSIS, ROUTINE W REFLEX MICROSCOPIC
Bilirubin Urine: NEGATIVE
Glucose, UA: NEGATIVE mg/dL
Hgb urine dipstick: NEGATIVE
Ketones, ur: NEGATIVE mg/dL
Leukocytes,Ua: NEGATIVE
Nitrite: NEGATIVE
Protein, ur: NEGATIVE mg/dL
Specific Gravity, Urine: 1.02 (ref 1.005–1.030)
pH: 5 (ref 5.0–8.0)

## 2020-11-07 LAB — I-STAT VENOUS BLOOD GAS, ED
Acid-base deficit: 5 mmol/L — ABNORMAL HIGH (ref 0.0–2.0)
Bicarbonate: 19.8 mmol/L — ABNORMAL LOW (ref 20.0–28.0)
Calcium, Ion: 1.13 mmol/L — ABNORMAL LOW (ref 1.15–1.40)
HCT: 29 % — ABNORMAL LOW (ref 36.0–46.0)
Hemoglobin: 9.9 g/dL — ABNORMAL LOW (ref 12.0–15.0)
O2 Saturation: 47 %
Potassium: 3.2 mmol/L — ABNORMAL LOW (ref 3.5–5.1)
Sodium: 142 mmol/L (ref 135–145)
TCO2: 21 mmol/L — ABNORMAL LOW (ref 22–32)
pCO2, Ven: 33.3 mmHg — ABNORMAL LOW (ref 44.0–60.0)
pH, Ven: 7.381 (ref 7.250–7.430)
pO2, Ven: 26 mmHg — CL (ref 32.0–45.0)

## 2020-11-07 LAB — CK: Total CK: 287 U/L — ABNORMAL HIGH (ref 38–234)

## 2020-11-07 LAB — PROTIME-INR
INR: 1.4 — ABNORMAL HIGH (ref 0.8–1.2)
Prothrombin Time: 17.1 seconds — ABNORMAL HIGH (ref 11.4–15.2)

## 2020-11-07 LAB — TROPONIN I (HIGH SENSITIVITY)
Troponin I (High Sensitivity): 179 ng/L (ref <18)
Troponin I (High Sensitivity): 147 ng/L (ref <18)

## 2020-11-07 LAB — AMMONIA: Ammonia: 17 umol/L (ref 9–35)

## 2020-11-07 LAB — BRAIN NATRIURETIC PEPTIDE: B Natriuretic Peptide: 677.6 pg/mL — ABNORMAL HIGH (ref 0.0–100.0)

## 2020-11-07 LAB — MAGNESIUM: Magnesium: 1.8 mg/dL (ref 1.7–2.4)

## 2020-11-07 LAB — PHOSPHORUS: Phosphorus: 5.8 mg/dL — ABNORMAL HIGH (ref 2.5–4.6)

## 2020-11-07 MED ORDER — ACETAMINOPHEN 650 MG RE SUPP: 650.0000 mg | Freq: Four times a day (QID) | RECTAL | Status: DC | PRN

## 2020-11-07 MED ORDER — ONDANSETRON HCL 4 MG PO TABS: 4.0000 mg | ORAL_TABLET | Freq: Four times a day (QID) | ORAL | Status: DC | PRN

## 2020-11-07 MED ORDER — VANCOMYCIN HCL 1250 MG/250ML IV SOLN
1250.0000 mg | Freq: Once | INTRAVENOUS | Status: AC
  Administered 2020-11-07: 1250 mg via INTRAVENOUS
  Filled 2020-11-07: qty 250

## 2020-11-07 MED ORDER — VANCOMYCIN HCL 500 MG/100ML IV SOLN
500.0000 mg | INTRAVENOUS | Status: DC
  Administered 2020-11-08: 500 mg via INTRAVENOUS
  Filled 2020-11-07 (×3): qty 100

## 2020-11-07 MED ORDER — SODIUM CHLORIDE 0.9 % IV SOLN
2.0000 g | INTRAVENOUS | Status: DC
  Administered 2020-11-08 – 2020-11-09 (×2): 2 g via INTRAVENOUS
  Filled 2020-11-07 (×2): qty 20

## 2020-11-07 MED ORDER — HEPARIN SODIUM (PORCINE) 5000 UNIT/ML IJ SOLN
5000.0000 [IU] | Freq: Three times a day (TID) | INTRAMUSCULAR | Status: DC
  Administered 2020-11-07 – 2020-11-15 (×13): 5000 [IU] via SUBCUTANEOUS
  Filled 2020-11-07 (×15): qty 1

## 2020-11-07 MED ORDER — DULOXETINE HCL 60 MG PO CPEP
60.0000 mg | ORAL_CAPSULE | Freq: Every day | ORAL | Status: DC
  Administered 2020-11-08 – 2020-11-15 (×7): 60 mg via ORAL
  Filled 2020-11-07 (×7): qty 1

## 2020-11-07 MED ORDER — ASPIRIN 81 MG PO CHEW
81.0000 mg | CHEWABLE_TABLET | Freq: Every day | ORAL | Status: DC
  Administered 2020-11-08 – 2020-11-12 (×4): 81 mg via ORAL
  Filled 2020-11-07 (×4): qty 1

## 2020-11-07 MED ORDER — LACTATED RINGERS IV BOLUS
1000.0000 mL | Freq: Once | INTRAVENOUS | Status: AC
  Administered 2020-11-07: 1000 mL via INTRAVENOUS

## 2020-11-07 MED ORDER — CALCIUM GLUCONATE-NACL 2-0.675 GM/100ML-% IV SOLN
2.0000 g | Freq: Once | INTRAVENOUS | Status: AC
  Administered 2020-11-07: 2000 mg via INTRAVENOUS
  Filled 2020-11-07: qty 100

## 2020-11-07 MED ORDER — NOREPINEPHRINE 4 MG/250ML-% IV SOLN: 0.0000 ug/min | INTRAVENOUS | Status: DC

## 2020-11-07 MED ORDER — LACTATED RINGERS IV BOLUS (SEPSIS)
1000.0000 mL | Freq: Once | INTRAVENOUS | Status: AC
  Administered 2020-11-07: 1000 mL via INTRAVENOUS

## 2020-11-07 MED ORDER — SODIUM CHLORIDE 0.9% FLUSH
3.0000 mL | Freq: Two times a day (BID) | INTRAVENOUS | Status: DC
  Administered 2020-11-07 – 2020-11-10 (×6): 3 mL via INTRAVENOUS

## 2020-11-07 MED ORDER — BUPROPION HCL ER (XL) 150 MG PO TB24
150.0000 mg | ORAL_TABLET | Freq: Every day | ORAL | Status: DC
  Administered 2020-11-08 – 2020-11-15 (×7): 150 mg via ORAL
  Filled 2020-11-07 (×7): qty 1

## 2020-11-07 MED ORDER — ENSURE ENLIVE PO LIQD
237.0000 mL | Freq: Two times a day (BID) | ORAL | Status: DC
  Administered 2020-11-08 – 2020-11-15 (×6): 237 mL via ORAL
  Filled 2020-11-07: qty 237

## 2020-11-07 MED ORDER — SODIUM CHLORIDE 0.9 % IV SOLN
2.0000 g | Freq: Once | INTRAVENOUS | Status: AC
  Administered 2020-11-07: 2 g via INTRAVENOUS
  Filled 2020-11-07: qty 20

## 2020-11-07 MED ORDER — PANTOPRAZOLE SODIUM 40 MG PO TBEC
40.0000 mg | DELAYED_RELEASE_TABLET | Freq: Every day | ORAL | Status: DC
  Administered 2020-11-08 – 2020-11-12 (×4): 40 mg via ORAL
  Filled 2020-11-07 (×4): qty 1

## 2020-11-07 MED ORDER — LACTATED RINGERS IV SOLN: INTRAVENOUS | Status: DC

## 2020-11-07 MED ORDER — ACETAMINOPHEN 325 MG PO TABS
650.0000 mg | ORAL_TABLET | Freq: Four times a day (QID) | ORAL | Status: DC | PRN
  Administered 2020-11-08: 650 mg via ORAL
  Filled 2020-11-07: qty 2

## 2020-11-07 MED ORDER — ALBUTEROL SULFATE (2.5 MG/3ML) 0.083% IN NEBU: 2.5000 mg | INHALATION_SOLUTION | Freq: Four times a day (QID) | RESPIRATORY_TRACT | Status: DC | PRN

## 2020-11-07 MED ORDER — OLANZAPINE 2.5 MG PO TABS
2.5000 mg | ORAL_TABLET | Freq: Every day | ORAL | Status: DC
  Administered 2020-11-08 – 2020-11-09 (×3): 2.5 mg via ORAL
  Filled 2020-11-07 (×4): qty 1

## 2020-11-07 MED ORDER — VANCOMYCIN HCL IN DEXTROSE 1-5 GM/200ML-% IV SOLN: 1000.0000 mg | Freq: Once | INTRAVENOUS | Status: DC

## 2020-11-07 MED ORDER — POTASSIUM CHLORIDE 10 MEQ/100ML IV SOLN
10.0000 meq | INTRAVENOUS | Status: AC
  Administered 2020-11-07 (×4): 10 meq via INTRAVENOUS
  Filled 2020-11-07 (×4): qty 100

## 2020-11-07 MED ORDER — ONDANSETRON HCL 4 MG/2ML IJ SOLN: 4.0000 mg | Freq: Four times a day (QID) | INTRAMUSCULAR | Status: DC | PRN

## 2020-11-07 NOTE — Progress Notes (Signed)
Pharmacy Antibiotic Note  [name] is a 69 y.o. female admitted on 11/07/2020 with AMS/sepsis likely cellulitis.  Pharmacy has been consulted for vancomycin dosing.  Ceftriaxone per MD.    Plan: Vancomycin 1250 mg IV x 1, then 500 mg IV every 24 hours (target vancomycin trough 15-20) Monitor renal function, clinical progression and LOT Vancomycin trough at steady state     Temp (24hrs), Avg:97.7 F (36.5 C), Min:97.7 F (36.5 C), Max:97.7 F (36.5 C)  Recent Labs  Lab 11/07/20 0907  WBC 19.2*  CREATININE 2.09*  LATICACIDVEN 2.5*    CrCl cannot be calculated (Unknown ideal weight.).    Allergies  Allergen Reactions  . Penicillins Rash    ++ tolerates cefepime++ Has patient had a PCN reaction causing immediate rash, facial/tongue/throat swelling, SOB or lightheadedness with hypotension: Unknown Has patient had a PCN reaction causing severe rash involving mucus membranes or skin necrosis: Unknown Has patient had a PCN reaction that required hospitalization: No Has patient had a PCN reaction occurring within the last 10 years: No If all of the above answers are "NO", then may proceed with Cephalosporin use.   . Latex   . Metoclopramide Hcl   . Neurontin [Gabapentin]     Bertis Ruddy, PharmD Clinical Pharmacist ED Pharmacist Phone # [phone] 11/07/2020 10:28 AM

## 2020-11-07 NOTE — Progress Notes (Signed)
Elink monitoring pt''s code sepsis.

## 2020-11-07 NOTE — Progress Notes (Signed)
Attempted lower extremity venous duplex.  Patient refused.   Abram Sander 11/07/2020 2:12 PM

## 2020-11-07 NOTE — ED Notes (Signed)
Helped nurse with a in and out patient is resting with call bell in reach

## 2020-11-07 NOTE — ED Provider Notes (Addendum)
[facility] EMERGENCY DEPARTMENT Provider Note   CSN: [id number] Arrival date & time: 11/07/20  0240     History Chief Complaint  Patient presents with  . Altered Mental Status    [name] is a 69 y.o. female.  HPI 69 year old female with history of dementia, unspecified CVA, diabetes mellitus type 2, recurrent UTIs, hypertension, chronic diastolic CHF, AVNRT status post ablation, COPD, hyperlipidemia, bariatric surgery presented from assisted living facility for altered mental status.  This is same presenting chief complaint from admission 11\01\2021.  During that hospitalization patient had acute metabolic encephalopathy with dementia palliative care was consulted.  No source of infection identified.  Today, [facility] assisted living reports patient has been confused for 2 to 3 days.  No report of fall or injury.  Patient is a very poor historian.  She endorses that she does not feel good generally but cannot localize any pain.  She seems uncomfortable with movements and exam and when examining endorses pain in her legs.    Past Medical History:  Diagnosis Date  . Allergy   . Anxiety   . Arthritis   . Asthma   . 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 Bilateral carotid artery occlusion 09/25/2013  . CAD s/p CABG 1994 09/25/2013   LIMA-LAD, SVG Diag, SVG-RCA, [place] Cath 2010: Stents in the proximal LAD with 40% in-stent restenosis and 50% post-stent stenosis, 80% small OM 1, 70% small OM 2, 40% first PLA; diffuse 70% distal RCA; patent SVG to diagonal, patent SVG to distal RCA, patent atretic LIMA EF 55%; normal nuclear stress test 2011  . Cataract   . CHF (congestive heart failure) ([place])   . Chronic anemia   . Depression   . Diabetes mellitus without complication ([place])   . Diastolic dysfunction   . GERD (gastroesophageal reflux disease)   . Glaucoma   . Heart murmur   . History of recurrent UTIs   . Hyperlipidemia   . Hypertension    . Hypocalcemia   . Ischemic heart disease   . Migraine   . Morbid obesity ([place])    s/p gastric bypass  . Osteoporosis   . Peroneal neuropathy   . Retinopathy   . Stroke [facility])     Patient Active Problem List   Diagnosis Date Noted  . AMS (altered mental status) 10/17/2020  . Acute metabolic encephalopathy 97/35/3299  . Hypomagnesemia 10/17/2020  . Rhabdomyolysis 09/01/2017  . Hypokalemia 09/01/2017  . Coffee ground emesis 09/01/2017  . SIRS (systemic inflammatory response syndrome) ([place]) 09/01/2017  . Acute lower UTI 09/01/2017  . AKI (acute kidney injury) ([place]) 09/01/2017  . Chronic pain 09/01/2017  . Hematemesis   . 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 COPD mixed type ([place]) 08/09/2017  . 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 Diabetes mellitus type 2, uncontrolled, without complications 24/26/8341  . Amaurosis fugax of right eye 10/26/2015  . Depression 02/28/2015  . Bilateral leg edema, asymmetrical (R>L) 02/20/2015  . SVT (supraventricular tachycardia) ([place]) 12/31/2014  . Diabetes ([place]) 12/30/2014  . Primary osteoarthritis of right hip 12/27/2014  . Angina decubitus ([place]) 11/08/2014  . Hip joint pain 11/08/2014  . Chronic diastolic heart failure ([place]) 11/08/2014  . Acute on chronic diastolic heart failure ([place]) 09/23/2014  . Protein-calorie malnutrition, severe ([place]) 03/31/2014  . Hypocalcemia 03/30/2014  . Diarrhea 03/30/2014  . Bronchitis, acute 03/30/2014  . CAD s/p CABG 1994 09/25/2013  . Bilateral carotid artery occlusion 09/25/2013  . AVNRT  s/p successful RF ablation 09/25/2013  . H/O gastric bypass 09/25/2013  . Acute encephalopathy 09/25/2013  . History of Diastolic heart failure 56/43/3295  . Iron deficiency anemia 05/28/2012    Past Surgical History:  Procedure Laterality Date  . APPENDECTOMY    . CARDIAC CATHETERIZATION  03/30/2009   3 vessel CAD,patent grafts  . CHOLECYSTECTOMY    . CORONARY ARTERY BYPASS GRAFT  1994    LIM to LAD,SVG to first diagonal,SVG to distal RCA  . GASTRIC BYPASS  2002  . HEEL SPUR EXCISION  2012  . HERNIA REPAIR    . NM MYOCAR PERF WALL MOTION  2011   no ischemia  . RADIOFREQUENCY ABLATION     AVNRT - successful  . SMALL INTESTINE SURGERY    . TOTAL HIP ARTHROPLASTY Right 12/27/2014   Procedure: TOTAL HIP ARTHROPLASTY ANTERIOR APPROACH;  Surgeon: Alta Corning, MD;  Location: [facility];  Service: Orthopedics;  Laterality: Right;  . TRIGGER FINGER RELEASE  2012  . US ECHOCARDIOGRAPHY  05/09/2010   mild MR,mild mitral and aortic sclerosis  . VAGINAL HYSTERECTOMY    . VENTRAL HERNIA REPAIR       OB History   No obstetric history on file.     Family History  Problem Relation Age of Onset  . Diabetes Brother   . Asthma Brother   . Depression Brother   . Asthma Brother   . Depression Brother   . Diabetes Brother   . Cancer Father        skin  . Alcohol abuse Father   . Arthritis Father   . Depression Father   . Hypertension Father   . Alzheimer's disease Father   . Diabetes Mother   . Heart failure Mother   . Arthritis Mother   . Heart disease Mother   . Asthma Sister   . Heart attack Sister   . Depression Sister   . Diabetes Sister   . Drug abuse Sister   . Heart disease Sister   . Mental illness Sister   . Heart attack Son   . Hypertension Son   . Kidney disease Son        transplant 2003  . Vision loss Son     Social History   Tobacco Use  . Smoking status: Former Smoker    Packs/day: 2.00    Quit date: 12/18/1987    Years since quitting: 32.9  . Smokeless tobacco: Never Used  . Tobacco comment: Pt unsure how many years she smoked total.  Substance Use Topics  . Alcohol use: No  . Drug use: No    Home Medications Prior to Admission medications   Medication Sig Start Date End Date Taking? Authorizing Provider  acetaminophen (TYLENOL) 500 MG tablet Take 1 tablet (500 mg total) by mouth every 6 (six) hours as needed for mild pain. 10/20/20  Yes  Aline August, MD  aspirin 81 MG tablet Take 81 mg by mouth daily.   Yes [provider]  blood glucose meter kit and supplies KIT Dispense based on patient and insurance preference.  Check blood sugar twice daily as directed. 12/24/16  Yes Orlena Sheldon, PA-C  buPROPion (WELLBUTRIN XL) 150 MG 24 hr tablet Take 150 mg by mouth daily.   Yes [provider]  Calcium Citrate-Vitamin D (CALCIUM CITRATE MAXIMUM/VIT D PO) Take 1 tablet by mouth at bedtime. Give 1 tablet by mouth   Yes [provider]  DULoxetine (CYMBALTA) 60 MG capsule Take  60 mg by mouth daily.   Yes [provider]  furosemide (LASIX) 20 MG tablet Take 20 mg by mouth daily. For 3 days   Yes [provider]  glucose blood test strip 1 each by Other route 2 (two) times daily. Dispense based on Insurance and patient preference.  Check blood sugars twice daily as instructed. 12/24/16  Yes Orlena Sheldon, PA-C  Iron, Ferrous Sulfate, 325 (65 Fe) MG TABS Take 325 mg by mouth daily.    Yes [provider]  Multiple Vitamins-Minerals (MULTIVITAMIN WITH MINERALS) tablet Take 1 tablet by mouth daily.   Yes [provider]  nitroGLYCERIN (NITROLINGUAL) 0.4 MG/SPRAY spray USE ONE TO TWO SPRAYS UNDER THE TONGUE EVERY 5 MINUTES AS NEEDED FOR CHEST PAIN Patient taking differently: Place 2 sprays under the tongue every 5 (five) minutes x 3 doses as needed for chest pain.  09/06/14  Yes Croitoru, Mihai, MD  OLANZapine (ZYPREXA) 2.5 MG tablet Take 1 tablet (2.5 mg total) by mouth at bedtime. 10/20/20  Yes Aline August, MD  omeprazole (PRILOSEC) 40 MG capsule Take 40 mg by mouth daily.   Yes [provider]  Skin Protectants, Misc. (BAZA PROTECT EX) Apply 1 application topically. Apply to buttock topically three times a day for skin protection    Yes [provider]  sulfamethoxazole-trimethoprim (BACTRIM DS) 800-160 MG tablet Take 1 tablet by mouth 2 (two) times daily. Pt on day  4 of 7 day suppply   Yes [provider]  vitamin E (VITAMIN E) 180 MG (400 UNITS) capsule Take 400 Units by mouth daily.   Yes [provider]    Allergies    Penicillins, Latex, Metoclopramide hcl, and Neurontin [gabapentin]  Review of Systems   Review of Systems 10 systems reviewed and negative except as per HPI Physical Exam Updated Vital Signs BP (!) 89/65   Pulse 99   Temp 97.7 F (36.5 C) (Oral)   Resp (!) 25   SpO2 100%   Physical Exam Constitutional:      Comments: Patient is pale and thin in appearance.  She awakens to voice.  She seems confused but some speech is situationally appropriate.  No respiratory distress.  HENT:     Mouth/Throat:     Comments: Mucous membrane slightly dry, airway clear. Eyes:     Extraocular Movements: Extraocular movements intact.  Cardiovascular:     Rate and Rhythm: Normal rate and regular rhythm.  Pulmonary:     Comments: No respiratory distress.  Lungs are grossly clear.  Possible crackle left base. Abdominal:     General: There is no distension.     Palpations: Abdomen is soft.     Tenderness: There is no abdominal tenderness. There is no guarding.  Genitourinary:    Comments: Digital rectal exam: No stool in the vault, no impaction. Musculoskeletal:     Comments: Examination of buttock area does not show any skin or tissue breakdown.  Patient has Coban dressing wrap on the lower leg on the right appears to be a wound care dressing.  No swelling above the dressing.  Patient's toes on the right are slightly cool and significant onychomycosis.  Temperature feet however is symmetric.  Bilateral dorsalis pedis pulses present by handheld Doppler device.  Left lower leg has erythema and swelling around the calf and posterior leg.  Small crenulated wound pattern suggestive of previous, significantly larger edema of the extremity.  Left foot slightly cool significant onychomycosis.  Left hand has  what appears to been  previously a skin tear or possibly burn to the dorsum.  Small amount of purulent ooze present.  The hand itself does not have any large amount of swelling or appearance of diffuse cellulitis.  Skin:    General: Skin is warm and dry.     Coloration: Skin is pale.  Neurological:     Comments: Patient is resting.  She awakens to voice.  She can give her full name.  She is not sure which hospital she is in.  Some speech is situationally appropriate but other times wanders and exhibit significant confusion.  She attempts some help at following commands to roll over the stretcher but is very limited by habitus and pain with activity as well as comprehension.  There did not appear to be any focal motor deficits.  There is no flaccid paralysis of any extremities.  If I assist the patient to hold the stretcher rail with each hand she will do so.  She can move her lower extremities.  Overall function however is very poor.     ED Results / Procedures / Treatments   Labs (all labs ordered are listed, but only abnormal results are displayed) Labs Reviewed  COMPREHENSIVE METABOLIC PANEL - Abnormal; Notable for the following components:      Result Value   Potassium 3.3 (*)    CO2 17 (*)    Glucose, Bld 174 (*)    BUN 37 (*)    Creatinine, Ser 2.09 (*)    Calcium 7.6 (*)    Total Protein 3.9 (*)    Albumin 1.6 (*)    AST 45 (*)    ALT 45 (*)    Total Bilirubin 1.3 (*)    GFR, Estimated 25 (*)    All other components within normal limits  CBC WITH DIFFERENTIAL/PLATELET - Abnormal; Notable for the following components:   WBC 19.2 (*)    RBC 3.08 (*)    Hemoglobin 9.4 (*)    HCT 30.0 (*)    Neutro Abs 16.9 (*)    Abs Immature Granulocytes 0.15 (*)    All other components within normal limits  LACTIC ACID, PLASMA - Abnormal; Notable for the following components:   Lactic Acid, Venous 2.5 (*)    All other components within normal limits  PROTIME-INR - Abnormal; Notable for the following  components:   Prothrombin Time 17.1 (*)    INR 1.4 (*)    All other components within normal limits  PHOSPHORUS - Abnormal; Notable for the following components:   Phosphorus 5.8 (*)    All other components within normal limits  I-STAT VENOUS BLOOD GAS, ED - Abnormal; Notable for the following components:   pCO2, Ven 33.3 (*)    pO2, Ven 26.0 (*)    Bicarbonate 19.8 (*)    TCO2 21 (*)    Acid-base deficit 5.0 (*)    Potassium 3.2 (*)    Calcium, Ion 1.13 (*)    HCT 29.0 (*)    Hemoglobin 9.9 (*)    All other components within normal limits  CULTURE, BLOOD (ROUTINE X 2)  CULTURE, BLOOD (ROUTINE X 2)  RESPIRATORY PANEL BY RT PCR (FLU A&B, COVID)  MAGNESIUM  AMMONIA  URINALYSIS, ROUTINE W REFLEX MICROSCOPIC  LACTIC ACID, PLASMA  BLOOD GAS, VENOUS  BRAIN NATRIURETIC PEPTIDE  TROPONIN I (HIGH SENSITIVITY)    EKG EKG Interpretation  Date/Time:  Monday November 07 2020 11:02:15 EST Ventricular Rate:  114 PR Interval:  QRS Duration: 95 QT Interval:  339 QTC Calculation: 467 R Axis:   100 Text Interpretation: Sinus tachycardia Multiple premature complexes, vent & supraven Right axis deviation Low voltage, precordial leads similar to previous, artifact, no appearance of acute ischemia although artifact obscurs inferior leads. Confirmed by Charlesetta Shanks [phone]) on 11/07/2020 12:28:12 PM   Radiology CT Head Wo Contrast  Result Date: 11/07/2020 CLINICAL DATA:  Subdural hematoma. EXAM: CT HEAD WITHOUT CONTRAST TECHNIQUE: Contiguous axial images were obtained from the base of the skull through the vertex without intravenous contrast. COMPARISON:  CT head October 17, 2020. FINDINGS: Brain: Interval decrease in size of the left cerebral convexity low-density extra-axial collection with 6 mm persistent collection anteriorly (for example series 5, images 26 and 28). No substantial mass effect or midline shift. No evidence of acute large vascular territory infarct. Remote right frontal  infarct. Similar generalized cerebral atrophy with patchy white matter hypoattenuation, compatible with chronic microvascular ischemic disease. No hydrocephalus. No mass lesion. Vascular: Calcific atherosclerosis. Skull: No acute fracture. Sinuses/Orbits: Ethmoid air cell mucosal thickening. Otherwise, sinuses are clear. Unremarkable orbits. Other: No mastoid effusions. IMPRESSION: 1. Interval decrease in size of the left cerebral convexity low-density extra-axial collection with 5 mm persistent collection anteriorly. No substantial mass effect or midline shift. 2. 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 Electronically Signed   By: Margaretha Sheffield MD   On: 11/07/2020 10:06   DG Chest Port 1 View  Result Date: 11/07/2020 CLINICAL DATA:  Altered mental status. EXAM: PORTABLE CHEST 1 VIEW COMPARISON:  October 17, 2020. FINDINGS: Stable cardiomediastinal silhouette. Status post coronary bypass graft. No pneumothorax or pleural effusion is noted. Lungs are clear. Bony thorax is unremarkable. IMPRESSION: No active disease. Electronically Signed   By: Marijo Conception M.D.   On: 11/07/2020 11:01    Procedures Procedures (including critical care time) CRITICAL CARE Performed by: Charlesetta Shanks   Total critical care time: 60 minutes  Critical care time was exclusive of separately billable procedures and treating other patients.  Critical care was necessary to treat or prevent imminent or life-threatening deterioration.  Critical care was time spent personally by me on the following activities: development of treatment plan with patient and/or surrogate as well as nursing, discussions with consultants, evaluation of patient's response to treatment, examination of patient, obtaining history from patient or surrogate, ordering and performing treatments and interventions, ordering and review of laboratory studies, ordering and review of radiographic studies, pulse oximetry  and re-evaluation of patient's condition. Medications Ordered in ED Medications  lactated ringers infusion (has no administration in time range)  lactated ringers bolus 1,000 mL (1,000 mLs Intravenous New Bag/Given 11/07/20 1043)    And  lactated ringers bolus 1,000 mL (1,000 mLs Intravenous New Bag/Given 11/07/20 1110)  vancomycin (VANCOREADY) IVPB 1250 mg/250 mL (has no administration in time range)  vancomycin (VANCOREADY) IVPB 500 mg/100 mL (has no administration in time range)  cefTRIAXone (ROCEPHIN) 2 g in sodium chloride 0.9 % 100 mL IVPB (2 g Intravenous New Bag/Given 11/07/20 1049)    ED Course  I have reviewed the triage vital signs and the nursing notes.  Pertinent labs & imaging results that were available during my care of the patient were reviewed by me and considered in my medical decision making (see chart for details).    MDM Rules/Calculators/A&P                         Patient presents  with altered mental status approximately 18 days after last discharge from the hospital.  Repeat CT head does not show any increase of previous identified subdural hematoma.  This is decreased.  No acute findings.  Patient does have hypotension and leukocytosis.  Unclear definitive source of sepsis however patient does have erythema of the left lower extremity consistent with cellulitis and a hand wound with some purulent drainage.  Will initiate sepsis protocol.  BUN and creatinine elevated consistent with AKI.  Patient has significant leukocytosis.  Patient does have history of CHF and has some peripheral edema in the legs.  At this time she does not have significant crackle of the lungs.  Patient does appear to have risk for developing volume overload and CHF, prognosis guarded, palliative care had been consulted on last admission but patient still remains full code.  Plan for admission.  Patient with blood pressure has remained in the 76J systolic after 5-1/8 L rehydration.  Will add  Levophed.  Have consulted first hospitalist for admission but now consulted critical care due to need to start pressor.  His mental status waxes and wanes.  She will talk directly to me and answer questions.  Then she becomes confused and starts exhibiting signs of delirium.  Patient denies pain but seems very uncomfortable when I am moving touch her legs.  I did remove the Coban from her dressing on the right.  Gauze wraps left in place.  These are not tight.  Doppler pulses were confirmed in both feet.  13: 20 patient seen by hospitalist service.  At this time they advised patient his map is 18 and does not need to be in ICU.  Systolic pressure remains 80/68, heart rate low 100s.  Patient has had 3 L of fluid.  Recommendation is for continued hydration as patient is still dehydrated.  They advised they will discuss the case with hospitalist service, and continue to consult as needed.  Will add additional liter fluids to total 4 L.  Final Clinical Impression(s) / ED Diagnoses Final diagnoses:  Sepsis, due to unspecified organism, unspecified whether acute organ dysfunction present ([employer])  AKI (acute kidney injury) ([place])  Confusion    Rx / DC Orders ED Discharge Orders    None       Charlesetta Shanks, MD 11/07/20 1131    Charlesetta Shanks, MD 11/07/20 [place], MD 11/25/20 2240

## 2020-11-07 NOTE — Consult Note (Signed)
NAME:  [name], MRN:  [id number], DOB:  June 18, 1951, LOS: 0 ADMISSION DATE:  11/07/2020, CONSULTATION DATE:  11/07/20 REFERRING MD:  Charlesetta Shanks, MD, CHIEF COMPLAINT:  Altered mental status  Brief History   [name] is a 60 female with history of diastolic CHF, type 2 diabetes, stroke, hypertension, COPD, hyperlipidemia, dementia, and bariatric surgery who presents from assisted living facility for altered mental status likely secondary to sepsis due to LLE cellulitis, CCM consulted for hypotension despite fluid resuscitation.    History of present illness   [name] is a 42 female with history of diastolic CHF, type 2 diabetes, stroke, hypertension, COPD, hyperlipidemia, dementia, and bariatric surgery who presents from assisted living facility for altered mental status likely for the past 2-3 days.  Patient is a poor historian with waxing and waning mental status. She is unsure why she is here, complaining of pain but unable to localize. Intermittently able to speak and answer questions appropriately but quickly becomes confused and agitated.   Patient recently admitted for similar altered mental status between 11/1 and 11/42021. Found to have new subdural hematoma and hypoglycemic. Treated with antibiotics for UTI and mental status gradually improved.    In ED patient is hypotensive to 80s/50s and tachycardic. CMP with K of 3.3, BUN 37, Cr. 2.09, midly elevated AST, ALT, Bili of 1.3. She has Leucocytosis of 19.2, with abs neutrophils of 16.9, hgb of 9.4 from 7.9 on last admission.  UA negative, COVID negative, Lactic acid 2.5>3.7, troponin 179.  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   Significant Hospital Events     Consults:  PCCM  Procedures:  None  Significant Diagnostic Tests:  11/22 CXR No active disease 11/22 CT head: 1. Interval decrease in size of the left cerebral convexity low-density extra-axial collection with 5 mm persistent collection anteriorly. No substantial mass effect or midline shift. 2. 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  Micro Data:  11/22 Blood cultures >> 11/22 Covid negative 11/22 Influenza A and B negative  Antimicrobials:  11/22 Vancomycin, ceftriaxione  Interim history/subjective:    Objective   Blood pressure (!) 89/68, pulse 99, temperature 97.7 F (36.5 C), temperature source Oral, resp. rate 14, SpO2 100 %.        Intake/Output Summary (Last 24 hours) at 11/07/2020 1338 Last data filed at 11/07/2020 1227 Gross per 24 hour  Intake 2100 ml  Output --  Net 2100 ml   There were no vitals filed for this visit.  Examination: General: Caucasion female, laying in bed intermittently moaning or yelling Eyes: Pupils equal, EOMI HENT: Atraumatic, dry mucous membranes Lungs: Clear to ascultation, no accessory muscle use Cardiovascular: Tachycardic, normal S1, S2, no JVD, no edema Abdomen: Soft, nontender, no masses, normal bowel sounds Neuro: Oriented to self only, lethargic, no focal weakness,  Skin: LLE erythema and warmth no drainage, scattered abrasions of LUE, diminished skin tugor Psychiatric: intermittently agitated, poor insight Resolved Hospital Problem list   None  Assessment & Plan:  Sepsis secondary to left lower extremity cellulitis Hypotension secondary to sepsis  Patient presenting with altered mental status in the setting of sepsis secondary of LLE. On exam MAPs around 60-65%. She appears hypovolemic on exam with diminished skin turgor and dry mucous membranes. She has received 2L LR, would benefit from further fluid resuscitation.  - Additional bolus of liter of LR after current fluids - Trend lactic acid - No indication for pressors at this time - Continue Vanc and ceftriaxone for cellulitis -  LE [place] to rule out DVT  Prerenal AKI Hypokalemia Patient presenting with Cr. of  2.09 baseline between 0.6-0.8. K of 3.3. - Continue IVF - Replete electrolytes - Avoid nephrotoxic agents - trend kidney function  Acute delirium  Patient with waxing and waning episodes of confusion and agitation. - treat infection - maintain day night cycles and frequent reorienting   Best practice:  Code Status: Full Disposition: Patient will be admitted by [facility] hospitalitis. Please contact CCM with any further questions.  Labs   CBC: Recent Labs  Lab 11/07/20 0907 11/07/20 0919  WBC 19.2*  --   NEUTROABS 16.9*  --   HGB 9.4* 9.9*  HCT 30.0* 29.0*  MCV 97.4  --   PLT 285  --     Basic Metabolic Panel: Recent Labs  Lab 11/07/20 0907 11/07/20 0919  NA 140 142  K 3.3* 3.2*  CL 109  --   CO2 17*  --   GLUCOSE 174*  --   BUN 37*  --   CREATININE 2.09*  --   CALCIUM 7.6*  --   MG 1.8  --   PHOS 5.8*  --    GFR: CrCl cannot be calculated (Unknown ideal weight.). Recent Labs  Lab 11/07/20 0907 11/07/20 1136  WBC 19.2*  --   LATICACIDVEN 2.5* 3.7*    Liver Function Tests: Recent Labs  Lab 11/07/20 0907  AST 45*  ALT 45*  ALKPHOS 105  BILITOT 1.3*  PROT 3.9*  ALBUMIN 1.6*   No results for input(s): LIPASE, AMYLASE in the last 168 hours. Recent Labs  Lab 11/07/20 0907  AMMONIA 17    ABG    Component Value Date/Time   HCO3 19.8 (L) 11/07/2020 0919   TCO2 21 (L) 11/07/2020 0919   ACIDBASEDEF 5.0 (H) 11/07/2020 0919   O2SAT  47.0 11/07/2020 0919     Coagulation Profile: Recent Labs  Lab 11/07/20 0907  INR 1.4*    Cardiac Enzymes: No results for input(s): CKTOTAL, CKMB, CKMBINDEX, TROPONINI in the last 168 hours.  HbA1C: Hgb A1c MFr Bld  Date/Time Value Ref Range Status  10/19/2020 05:25 AM 4.8 4.8 - 5.6 % Final    Comment:    (NOTE) Pre diabetes:          5.7%-6.4%  Diabetes:              >6.4%  Glycemic control for   <7.0% adults with diabetes   08/31/2017 11:42 PM 10.3 (H) 4.8 - 5.6 % Final    Comment:    (NOTE) Pre diabetes:          5.7%-6.4% Diabetes:              >6.4% Glycemic control for   <7.0% adults with diabetes     CBG: No results for input(s): GLUCAP in the last 168 hours.  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   Surgical History    Past Surgical History:  Procedure Laterality Date  . APPENDECTOMY    . CARDIAC CATHETERIZATION  03/30/2009   3 vessel CAD,patent grafts  . CHOLECYSTECTOMY    . CORONARY ARTERY BYPASS GRAFT  1994   LIM to LAD,SVG to first diagonal,SVG to distal RCA  . GASTRIC BYPASS  2002  . HEEL SPUR EXCISION  2012  . HERNIA REPAIR    . NM MYOCAR PERF WALL MOTION  2011   no ischemia  . RADIOFREQUENCY ABLATION     AVNRT - successful  . SMALL INTESTINE SURGERY    . TOTAL HIP ARTHROPLASTY Right 12/27/2014   Procedure: TOTAL HIP ARTHROPLASTY ANTERIOR APPROACH;  Surgeon: Alta Corning, MD;  Location: [facility];  Service: Orthopedics;  Laterality: Right;  . TRIGGER FINGER RELEASE  2012  . US ECHOCARDIOGRAPHY  05/09/2010   mild MR,mild mitral and aortic sclerosis  . VAGINAL HYSTERECTOMY    . VENTRAL HERNIA REPAIR       Social History   reports that she quit smoking about 32 years ago. She smoked 2.00 packs per day. She has never used smokeless tobacco. She reports that she does not drink alcohol and does not use drugs.   Family History   Her family history includes Alcohol abuse in her father; Alzheimer's disease in her father; Arthritis in her father and mother; Asthma in her brother, brother, and sister; Cancer in her father; Depression in her brother, brother, father, and sister; Diabetes in her brother, brother, mother, and sister; Drug abuse in her sister; Heart attack in her sister and son; Heart disease in her mother and sister; Heart failure in her mother; Hypertension in her father and son; Kidney disease in her son; Mental illness in her sister; Vision loss in her son.   Allergies Allergies  Allergen Reactions  . Penicillins Rash    ++ tolerates cefepime++ Has patient had a PCN reaction causing immediate rash, facial/tongue/throat swelling, SOB or lightheadedness with hypotension: Unknown Has patient had a PCN reaction causing severe rash involving mucus membranes or skin necrosis: Unknown Has patient had a PCN reaction that required hospitalization: No Has patient had a PCN reaction occurring within the last 10 years: No If all of the above answers are "NO", then may proceed with Cephalosporin use.   . Latex   . Metoclopramide Hcl   . Neurontin [Gabapentin]      Home Medications  Prior to Admission medications   Medication Sig Start Date End Date Taking? Authorizing Provider  acetaminophen (TYLENOL) 500 MG tablet Take 1 tablet (500 mg total) by mouth every 6 (six) hours as needed for mild pain. 10/20/20  Yes Aline August, MD  aspirin 81 MG tablet Take  81 mg by mouth daily.   Yes [provider]  blood glucose meter kit and supplies KIT Dispense based on patient and insurance preference.  Check blood sugar twice daily as directed. 12/24/16  Yes Orlena Sheldon, PA-C  buPROPion (WELLBUTRIN XL) 150 MG 24 hr tablet Take 150 mg by mouth daily.   Yes [provider]  Calcium Citrate-Vitamin D (CALCIUM CITRATE MAXIMUM/VIT D PO) Take 1 tablet by mouth at bedtime. Give 1 tablet by mouth   Yes [provider]  DULoxetine (CYMBALTA) 60 MG capsule Take 60 mg by mouth daily.   Yes [provider]  furosemide (LASIX) 20 MG tablet Take 20 mg by mouth daily. For 3 days   Yes [provider]  glucose blood test strip 1 each by Other route 2 (two) times daily. Dispense based on Insurance and patient preference.  Check blood sugars twice daily as instructed. 12/24/16  Yes Orlena Sheldon, PA-C  Iron, Ferrous Sulfate, 325 (65 Fe) MG TABS Take 325 mg by mouth daily.    Yes [provider]  Multiple Vitamins-Minerals (MULTIVITAMIN WITH MINERALS) tablet Take 1 tablet by mouth daily.   Yes [provider]  nitroGLYCERIN (NITROLINGUAL) 0.4 MG/SPRAY spray USE ONE TO TWO SPRAYS UNDER THE TONGUE EVERY 5 MINUTES AS NEEDED FOR CHEST PAIN Patient taking differently: Place 2 sprays under the tongue every 5 (five) minutes x 3 doses as needed for chest pain.  09/06/14  Yes Croitoru, Mihai, MD  OLANZapine (ZYPREXA) 2.5 MG tablet Take 1 tablet (2.5 mg total) by mouth at bedtime. 10/20/20  Yes Aline August, MD  omeprazole (PRILOSEC) 40 MG capsule Take 40 mg by mouth daily.   Yes [provider]  Skin Protectants, Misc. (BAZA PROTECT EX) Apply 1 application topically. Apply to buttock topically three times a day for skin protection    Yes [provider]  sulfamethoxazole-trimethoprim (BACTRIM DS) 800-160 MG tablet Take 1 tablet by mouth 2 (two) times daily. Pt on day 4 of 7 day suppply   Yes [provider]  vitamin E (VITAMIN E) 180 MG (400 UNITS) capsule Take 400 Units by mouth daily.   Yes [provider]     Iona Beard PGY-1 Internal Medicine 11/07/20 1:39 PM

## 2020-11-07 NOTE — ED Notes (Signed)
Critical Care MD at bedside.  

## 2020-11-07 NOTE — ED Notes (Signed)
Help get patient cleaned p placed a external cath put a brief on patient

## 2020-11-07 NOTE — H&P (Addendum)
History and Physical    [name] VEH:[id number] DOB: Apr 21, 1951 DOA: 11/07/2020  Referring MD/NP/PA: Tomasa Rand, MD PCP: Roetta Sessions, NP  Patient coming from: [place] facility via EMS  Chief Complaint: Altered  I have personally briefly reviewed patient's old medical records in [place]   HPI: [name] is a 69 y.o. female with medical history significant of dementia, CAD, diastolic CHF last EF 55 to 60%, diabetes mellitus type II, AVNRT s/p ablation, and COPD who presented after being found to be more altered over the last 2 to 3 days.  Patient appears confused, but does state that she is at [facility].  She complains of having pain and not feeling well, but does not localize.  Her son is present at bedside and notes that at baseline her mental status waxes and wanes.  He notes that he is in charge of her decision making and like to make her DNR.  She had recently been hospitalized from 11/1-11/3 with acute encephalopathy thought likely to be multifactorial in nature given initial hypoglycemia of 29 with an acute kidney injury.  Symptoms improved with fluids and discontinuation of oral hypoglycemic agent.  There was concern for the possibility of a urinary tract infection and patient was initially started on IV antibiotics of Rocephin, but this was ruled out and discontinued.  ED Course: Upon admission into the emergency department patient was seen to be afebrile with pulse 90-120s, respiration 14-25, blood pressure 61/39-97/53, and O2 saturations currently maintained on room air.  Labs significant for WBC 19.2, hemoglobin 9.9, potassium 3.2, BUN 37, creatinine 2.09, and calcium 7.6, high-sensitivity troponin 179->147, and lactic acid 2.5->3.7->2.5.  Respiratory virus panel sepsis order set was initiated with empiric antibiotics of vancomycin and Rocephin for concern for possible cellulitis.  PCCM has been formally consulted due to persistent hypotension  to evaluate the patient.  Suspect the patient to be severely dry and recommended giving patient at least 4 L of IV fluids.  Review of Systems  Unable to perform ROS: Dementia  Constitutional: Positive for malaise/fatigue.    Past Medical History:  Diagnosis Date  . Allergy   . Anxiety   . Arthritis   . Asthma   . 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 Bilateral carotid artery occlusion 09/25/2013  . CAD s/p CABG 1994 09/25/2013   LIMA-LAD, SVG Diag, SVG-RCA, [facility] 2010: Stents in the proximal LAD with 40% in-stent restenosis and 50% post-stent stenosis, 80% small OM 1, 70% small OM 2, 40% first PLA; diffuse 70% distal RCA; patent SVG to diagonal, patent SVG to distal RCA, patent atretic LIMA EF 55%; normal nuclear stress test 2011  . Cataract   . CHF (congestive heart failure) ([place])   . Chronic anemia   . Depression   . Diabetes mellitus without complication ([place])   . Diastolic dysfunction   . GERD (gastroesophageal reflux disease)   . Glaucoma   . Heart murmur   . History of recurrent UTIs   . Hyperlipidemia   . Hypertension   . Hypocalcemia   . Ischemic heart disease   . Migraine   . Morbid obesity ([place])    s/p gastric bypass  . Osteoporosis   . Peroneal neuropathy   . Retinopathy   . Stroke [facility])     Past Surgical History:  Procedure Laterality Date  . APPENDECTOMY    . CARDIAC CATHETERIZATION  03/30/2009   3 vessel CAD,patent grafts  . CHOLECYSTECTOMY    .  CORONARY ARTERY BYPASS GRAFT  1994   LIM to LAD,SVG to first diagonal,SVG to distal RCA  . GASTRIC BYPASS  2002  . HEEL SPUR EXCISION  2012  . HERNIA REPAIR    . NM MYOCAR PERF WALL MOTION  2011   no ischemia  . RADIOFREQUENCY ABLATION     AVNRT - successful  . SMALL INTESTINE SURGERY    . TOTAL HIP ARTHROPLASTY Right 12/27/2014   Procedure: TOTAL HIP ARTHROPLASTY ANTERIOR APPROACH;  Surgeon: Alta Corning, MD;  Location: [facility];  Service: Orthopedics;  Laterality: Right;  . TRIGGER  FINGER RELEASE  2012  . US ECHOCARDIOGRAPHY  05/09/2010   mild MR,mild mitral and aortic sclerosis  . VAGINAL HYSTERECTOMY    . VENTRAL HERNIA REPAIR       reports that she quit smoking about 32 years ago. She smoked 2.00 packs per day. She has never used smokeless tobacco. She reports that she does not drink alcohol and does not use drugs.  Allergies  Allergen Reactions  . Penicillins Rash    ++ tolerates cefepime++ Has patient had a PCN reaction causing immediate rash, facial/tongue/throat swelling, SOB or lightheadedness with hypotension: Unknown Has patient had a PCN reaction causing severe rash involving mucus membranes or skin necrosis: Unknown Has patient had a PCN reaction that required hospitalization: No Has patient had a PCN reaction occurring within the last 10 years: No If all of the above answers are "NO", then may proceed with Cephalosporin use.   . Latex   . Metoclopramide Hcl   . Neurontin [Gabapentin]     Family History  Problem Relation Age of Onset  . Diabetes Brother   . Asthma Brother   . Depression Brother   . Asthma Brother   . Depression Brother   . Diabetes Brother   . Cancer Father        skin  . Alcohol abuse Father   . Arthritis Father   . Depression Father   . Hypertension Father   . Alzheimer's disease Father   . Diabetes Mother   . Heart failure Mother   . Arthritis Mother   . Heart disease Mother   . Asthma Sister   . Heart attack Sister   . Depression Sister   . Diabetes Sister   . Drug abuse Sister   . Heart disease Sister   . Mental illness Sister   . Heart attack Son   . Hypertension Son   . Kidney disease Son        transplant 2003  . Vision loss Son     Prior to Admission medications   Medication Sig Start Date End Date Taking? Authorizing Provider  acetaminophen (TYLENOL) 500 MG tablet Take 1 tablet (500 mg total) by mouth every 6 (six) hours as needed for mild pain. 10/20/20  Yes Aline August, MD  aspirin 81 MG  tablet Take 81 mg by mouth daily.   Yes [provider]  blood glucose meter kit and supplies KIT Dispense based on patient and insurance preference.  Check blood sugar twice daily as directed. 12/24/16  Yes Orlena Sheldon, PA-C  buPROPion (WELLBUTRIN XL) 150 MG 24 hr tablet Take 150 mg by mouth daily.   Yes [provider]  Calcium Citrate-Vitamin D (CALCIUM CITRATE MAXIMUM/VIT D PO) Take 1 tablet by mouth at bedtime. Give 1 tablet by mouth   Yes [provider]  DULoxetine (CYMBALTA) 60 MG capsule Take 60 mg by mouth daily.  Yes [provider]  furosemide (LASIX) 20 MG tablet Take 20 mg by mouth daily. For 3 days   Yes [provider]  glucose blood test strip 1 each by Other route 2 (two) times daily. Dispense based on Insurance and patient preference.  Check blood sugars twice daily as instructed. 12/24/16  Yes Orlena Sheldon, PA-C  Iron, Ferrous Sulfate, 325 (65 Fe) MG TABS Take 325 mg by mouth daily.    Yes [provider]  Multiple Vitamins-Minerals (MULTIVITAMIN WITH MINERALS) tablet Take 1 tablet by mouth daily.   Yes [provider]  nitroGLYCERIN (NITROLINGUAL) 0.4 MG/SPRAY spray USE ONE TO TWO SPRAYS UNDER THE TONGUE EVERY 5 MINUTES AS NEEDED FOR CHEST PAIN Patient taking differently: Place 2 sprays under the tongue every 5 (five) minutes x 3 doses as needed for chest pain.  09/06/14  Yes Croitoru, Mihai, MD  OLANZapine (ZYPREXA) 2.5 MG tablet Take 1 tablet (2.5 mg total) by mouth at bedtime. 10/20/20  Yes Aline August, MD  omeprazole (PRILOSEC) 40 MG capsule Take 40 mg by mouth daily.   Yes [provider]  Skin Protectants, Misc. (BAZA PROTECT EX) Apply 1 application topically. Apply to buttock topically three times a day for skin protection    Yes [provider]  sulfamethoxazole-trimethoprim (BACTRIM DS) 800-160 MG tablet Take 1 tablet by mouth 2 (two) times daily. Pt on day 4 of 7 day suppply   Yes [provider]  vitamin E (VITAMIN E) 180 MG (400 UNITS) capsule Take 400 Units by mouth daily.   Yes [provider]    Physical Exam:  Constitutional: Patient is lethargic but we will intermittently awaken and attempt to answer questions Vitals:   11/07/20 1230 11/07/20 1300 11/07/20 1330 11/07/20 1501  BP: (!) 85/50 (!) 89/68 (!) 83/55 (!) 77/47  Pulse: 90 99 100   Resp: $Remo'19 14 16 15  'PgCjj$ Temp:      TempSrc:      SpO2: 100% 100% 100%    Eyes: PERRL, lids and conjunctivae normal ENMT: Mucous membranes are dry. Posterior pharynx clear of any exudate or lesions.  Neck: normal, supple, no masses, no thyromegaly Respiratory: clear to auscultation bilaterally, no wheezing, no crackles. Normal respiratory effort. No accessory muscle use.  Cardiovascular: Regular rate and rhythm, no murmurs / rubs / gallops. No extremity edema. 2+ pedal pulses. No carotid bruits.  Abdomen: no tenderness, no masses palpated. No hepatosplenomegaly. Bowel sounds positive.  Musculoskeletal: .  Skin: Left lower extremity erythema with abrasions appreciated.  Right leg is currently bandaged. Neurologic: CN 2-12 grossly intact.  Moves all extremities. Psychiatric: Poor memory.  Oriented to self and states that she is at [facility].  Appears intermittently agitated at times.    Labs on Admission: I have personally reviewed following labs and imaging studies  CBC: Recent Labs  Lab 11/07/20 0907 11/07/20 0919  WBC 19.2*  --   NEUTROABS 16.9*  --   HGB 9.4* 9.9*  HCT 30.0* 29.0*  MCV 97.4  --   PLT 285  --    Basic Metabolic Panel: Recent Labs  Lab 11/07/20 0907 11/07/20 0919  NA 140 142  K 3.3* 3.2*  CL 109  --   CO2 17*  --   GLUCOSE 174*  --   BUN 37*  --   CREATININE 2.09*  --   CALCIUM 7.6*  --   MG 1.8  --   PHOS 5.8*  --    GFR: CrCl  cannot be calculated (Unknown ideal weight.). Liver Function Tests: Recent Labs  Lab 11/07/20 0907  AST 45*  ALT 45*  ALKPHOS 105  BILITOT 1.3*    PROT 3.9*  ALBUMIN 1.6*   No results for input(s): LIPASE, AMYLASE in the last 168 hours. Recent Labs  Lab 11/07/20 0907  AMMONIA 17   Coagulation Profile: Recent Labs  Lab 11/07/20 0907  INR 1.4*   Cardiac Enzymes: No results for input(s): CKTOTAL, CKMB, CKMBINDEX, TROPONINI in the last 168 hours. BNP (last 3 results) No results for input(s): PROBNP in the last 8760 hours. HbA1C: No results for input(s): HGBA1C in the last 72 hours. CBG: No results for input(s): GLUCAP in the last 168 hours. Lipid Profile: No results for input(s): CHOL, HDL, LDLCALC, TRIG, CHOLHDL, LDLDIRECT in the last 72 hours. Thyroid Function Tests: No results for input(s): TSH, T4TOTAL, FREET4, T3FREE, THYROIDAB in the last 72 hours. Anemia Panel: No results for input(s): VITAMINB12, FOLATE, FERRITIN, TIBC, IRON, RETICCTPCT in the last 72 hours. Urine analysis:    Component Value Date/Time   COLORURINE AMBER (A) 11/07/2020 0806   APPEARANCEUR HAZY (A) 11/07/2020 0806   APPEARANCEUR Cloudy 01/16/2015 1515   LABSPEC 1.020 11/07/2020 0806   LABSPEC 1.014 01/16/2015 1515   PHURINE 5.0 11/07/2020 0806   GLUCOSEU NEGATIVE 11/07/2020 0806   GLUCOSEU Negative 01/16/2015 1515   HGBUR NEGATIVE 11/07/2020 0806   BILIRUBINUR NEGATIVE 11/07/2020 0806   BILIRUBINUR Negative 01/16/2015 1515   KETONESUR NEGATIVE 11/07/2020 0806   PROTEINUR NEGATIVE 11/07/2020 0806   UROBILINOGEN 0.2 03/30/2014 1415   NITRITE NEGATIVE 11/07/2020 0806   LEUKOCYTESUR NEGATIVE 11/07/2020 0806   LEUKOCYTESUR 2+ 01/16/2015 1515   Sepsis Labs: Recent Results (from the past 240 hour(s))  Respiratory Panel by RT PCR (Flu A&B, Covid) - Nasopharyngeal Swab     Status: None   Collection Time: 11/07/20 10:30 AM   Specimen: Nasopharyngeal Swab; Nasopharyngeal(NP) swabs in vial transport medium  Result Value Ref Range Status   SARS Coronavirus 2 by RT PCR NEGATIVE NEGATIVE Final    Comment: (NOTE) SARS-CoV-2 target nucleic acids  are NOT DETECTED.  The SARS-CoV-2 RNA is generally detectable in upper respiratoy specimens during the acute phase of infection. The lowest concentration of SARS-CoV-2 viral copies this assay can detect is 131 copies/mL. A negative result does not preclude SARS-Cov-2 infection and should not be used as the sole basis for treatment or other patient management decisions. A negative result may occur with  improper specimen collection/handling, submission of specimen other than nasopharyngeal swab, presence of viral mutation(s) within the areas targeted by this assay, and inadequate number of viral copies (<131 copies/mL). A negative result must be combined with clinical observations, patient history, and epidemiological information. The expected result is Negative.  Fact Sheet for Patients:  PinkCheek.be  Fact Sheet for Healthcare Providers:  GravelBags.it  This test is no t yet approved or cleared by the [place] FDA and  has been authorized for detection and/or diagnosis of SARS-CoV-2 by FDA under an Emergency Use Authorization (EUA). This EUA will remain  in effect (meaning this test can be used) for the duration of the COVID-19 declaration under Section 564(b)(1) of the Act, 21 U.S.C. section 360bbb-3(b)(1), unless the authorization is terminated or revoked sooner.     Influenza A by PCR NEGATIVE NEGATIVE Final   Influenza B by PCR NEGATIVE NEGATIVE Final    Comment: (NOTE) The Xpert Xpress SARS-CoV-2/FLU/RSV assay is intended as an aid in  the diagnosis of  influenza from Nasopharyngeal swab specimens and  should not be used as a sole basis for treatment. Nasal washings and  aspirates are unacceptable for Xpert Xpress SARS-CoV-2/FLU/RSV  testing.  Fact Sheet for Patients: PinkCheek.be  Fact Sheet for Healthcare Providers: GravelBags.it  This test is not  yet approved or cleared by the [place] FDA and  has been authorized for detection and/or diagnosis of SARS-CoV-2 by  FDA under an Emergency Use Authorization (EUA). This EUA will remain  in effect (meaning this test can be used) for the duration of the  Covid-19 declaration under Section 564(b)(1) of the Act, 21  U.S.C. section 360bbb-3(b)(1), unless the authorization is  terminated or revoked. Performed at [facility], [place] [address] [place],  [address]      Radiological Exams on Admission: CT Head Wo Contrast  Result Date: 11/07/2020 CLINICAL DATA:  Subdural hematoma. EXAM: CT HEAD WITHOUT CONTRAST TECHNIQUE: Contiguous axial images were obtained from the base of the skull through the vertex without intravenous contrast. COMPARISON:  CT head October 17, 2020. FINDINGS: Brain: Interval decrease in size of the left cerebral convexity low-density extra-axial collection with 6 mm persistent collection anteriorly (for example series 5, images 26 and 28). No substantial mass effect or midline shift. No evidence of acute large vascular territory infarct. Remote right frontal infarct. Similar generalized cerebral atrophy with patchy white matter hypoattenuation, compatible with chronic microvascular ischemic disease. No hydrocephalus. No mass lesion. Vascular: Calcific atherosclerosis. Skull: No acute fracture. Sinuses/Orbits: Ethmoid air cell mucosal thickening. Otherwise, sinuses are clear. Unremarkable orbits. Other: No mastoid effusions. IMPRESSION: 1. Interval decrease in size of the left cerebral convexity low-density extra-axial collection with 5 mm persistent collection anteriorly. No substantial mass effect or midline shift. 2. 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 Electronically Signed   By: Margaretha Sheffield MD   On: 11/07/2020 10:06   DG Chest Port 1 View  Result Date: 11/07/2020 CLINICAL DATA:  Altered mental status. EXAM: PORTABLE  CHEST 1 VIEW COMPARISON:  October 17, 2020. FINDINGS: Stable cardiomediastinal silhouette. Status post coronary bypass graft. No pneumothorax or pleural effusion is noted. Lungs are clear. Bony thorax is unremarkable. IMPRESSION: No active disease. Electronically Signed   By: Marijo Conception M.D.   On: 11/07/2020 11:01    EKG: Independently reviewed.  Sinus tachycardia with PVCs at 114 bpm  Assessment/Plan Severe sepsis secondary to cellulitis of the left lower extremity: Acute.  Patient present with after being noticed to be acutely altered at admission facility.  Noted to be tachycardic and tachypneic with labs revealing WBC 19.2 with lactic acid 2.5->3.7->2.5.  -Admit to a progressive bed -Follow-up blood cultures -Continue to monitor the vancomycin and Rocephin -Continue trending lactic acid  -Continue IV fluids -Follow-up Doppler ultrasound of the left lower extremity  Acute Renal Failure secondary to severe dehydration: Patient baseline creatinine previously was noted to be 0.6, but she presents with creatinine elevated to 2.09 with BUN 37.  Suspecting prerenal cause of symptoms.  Patient had received a total of 4 L of IV fluids while in the ED. -Add on CK -Continue IV fluids at 100 ml/hr -Recheck kidney function in a.m.  Hypokalemia  hypocalcemia: On admission patient with multiple electrolyte abnormalities with potassium 3.3, calcium 7.6, and ionized calcium 1.13. -Give 40 mEq of potassium chloride IV and 2 g of calcium gluconate -Continue to monitor and replace as needed  Diastolic congestive heart failure: Patient appears to be hypovolemic. on admission patient  was noted to have BNP of 677.6, but clinically patient appears to be dry.  Noted to have normal EF of 55 to 60% with grade 2 diastolic dysfunction back in 2015. -Strict intake and output -Daily weights -Continuous pulse oximetry and recommend decreasing/stopping fluids if needed for signs of shortness of breath  Acute  metabolic encephalopathy dementia: At baseline patient is coming from memory care unit, but have been more lethargic and not responsive as normal.  CT scan of the head showed decreasing fluid collection previous study from 11/1.  Change in mental status likely related with severe dehydration and/or infection with history of dementia at baseline. -Neurochecks  Elevated troponin: Initial cardiac troponin elevated at 179 with repeat 147.  Question if secondary to demand in the setting of hypotension and severe dehydration -Continue to monitor  Diabetes mellitus type 2: During last hospitalization patient was discontinued off all oral medications.  Hemoglobin A1c was noted to be 4.8. -Continue to monitor off medication for now  Protein calorie malnutrition: On admission albumin noted to be significantly low at 1.6. -Check prealbumin in a.m.  Transaminitis: Acute.  AST and ALT mildly elevated at 45.  DVT prophylaxis: Heparin Code Status: DNR at son's request Family Communication: Son updated at bedside Disposition Plan: Likely discharge back to memory care unit once medically stable Consults called: PCCM Admission status: Inpatient requiring more than 2 midnight stay in need of IV fluid rehydration and antibiotics  Norval Morton MD [facility] Pager [phone]   If 7PM-7AM, please contact night-coverage [email or web address] Password [email or web address]  11/07/2020, 3:18 PM

## 2020-11-07 NOTE — ED Notes (Signed)
MD [place] at bedside

## 2020-11-07 NOTE — ED Notes (Signed)
Family and nurse at bedside.  No requests at present

## 2020-11-07 NOTE — ED Triage Notes (Signed)
Patient arrives to ED from [facility] with chief complaint of altered mental status. Per facility pt has been confused over the last 2-3 days. Facility denies any recent falls but pt has multiple abrasions on arm.

## 2020-11-08 DIAGNOSIS — R6520 Severe sepsis without septic shock: Secondary | ICD-10-CM | POA: Diagnosis not present

## 2020-11-08 DIAGNOSIS — A419 Sepsis, unspecified organism: Secondary | ICD-10-CM | POA: Diagnosis not present

## 2020-11-08 LAB — COMPREHENSIVE METABOLIC PANEL
ALT: 43 U/L (ref 0–44)
AST: 53 U/L — ABNORMAL HIGH (ref 15–41)
Albumin: 1.5 g/dL — ABNORMAL LOW (ref 3.5–5.0)
Alkaline Phosphatase: 117 U/L (ref 38–126)
Anion gap: 13 (ref 5–15)
BUN: 33 mg/dL — ABNORMAL HIGH (ref 8–23)
CO2: 18 mmol/L — ABNORMAL LOW (ref 22–32)
Calcium: 7.7 mg/dL — ABNORMAL LOW (ref 8.9–10.3)
Chloride: 108 mmol/L (ref 98–111)
Creatinine, Ser: 1.62 mg/dL — ABNORMAL HIGH (ref 0.44–1.00)
GFR, Estimated: 34 mL/min — ABNORMAL LOW (ref >60)
Glucose, Bld: 79 mg/dL (ref 70–99)
Potassium: 3.7 mmol/L (ref 3.5–5.1)
Sodium: 139 mmol/L (ref 135–145)
Total Bilirubin: 1 mg/dL (ref 0.3–1.2)
Total Protein: 3.7 g/dL — ABNORMAL LOW (ref 6.5–8.1)

## 2020-11-08 LAB — BLOOD GAS, VENOUS
Acid-base deficit: 2.1 mmol/L — ABNORMAL HIGH (ref 0.0–2.0)
Bicarbonate: 21.2 mmol/L (ref 20.0–28.0)
FIO2: 21
O2 Saturation: 47.1 %
Patient temperature: 37
pCO2, Ven: 30.7 mmHg — ABNORMAL LOW (ref 44.0–60.0)
pH, Ven: 7.454 — ABNORMAL HIGH (ref 7.250–7.430)
pO2, Ven: <31 mmHg — CL (ref 32.0–45.0)

## 2020-11-08 LAB — CBC
HCT: 28.7 % — ABNORMAL LOW (ref 36.0–46.0)
Hemoglobin: 9.2 g/dL — ABNORMAL LOW (ref 12.0–15.0)
MCH: 31 pg (ref 26.0–34.0)
MCHC: 32.1 g/dL (ref 30.0–36.0)
MCV: 96.6 fL (ref 80.0–100.0)
Platelets: 261 10*3/uL (ref 150–400)
RBC: 2.97 MIL/uL — ABNORMAL LOW (ref 3.87–5.11)
RDW: 14.1 % (ref 11.5–15.5)
WBC: 17.4 10*3/uL — ABNORMAL HIGH (ref 4.0–10.5)
nRBC: 0.1 % (ref 0.0–0.2)

## 2020-11-08 LAB — PROCALCITONIN: Procalcitonin: 0.61 ng/mL

## 2020-11-08 LAB — PREALBUMIN: Prealbumin: 5.1 mg/dL — ABNORMAL LOW (ref 18–38)

## 2020-11-08 LAB — MAGNESIUM: Magnesium: 1.5 mg/dL — ABNORMAL LOW (ref 1.7–2.4)

## 2020-11-08 LAB — MRSA PCR SCREENING: MRSA by PCR: POSITIVE — AB

## 2020-11-08 MED ORDER — LACTATED RINGERS IV SOLN: INTRAVENOUS | Status: AC

## 2020-11-08 MED ORDER — CHLORHEXIDINE GLUCONATE CLOTH 2 % EX PADS
6.0000 | MEDICATED_PAD | Freq: Every day | CUTANEOUS | Status: AC
  Administered 2020-11-10 – 2020-11-11 (×2): 6 via TOPICAL

## 2020-11-08 MED ORDER — MAGNESIUM SULFATE 2 GM/50ML IV SOLN
2.0000 g | Freq: Once | INTRAVENOUS | Status: AC
  Administered 2020-11-08: 2 g via INTRAVENOUS
  Filled 2020-11-08: qty 50

## 2020-11-08 MED ORDER — MUPIROCIN 2 % EX OINT
1.0000 "application " | TOPICAL_OINTMENT | Freq: Two times a day (BID) | CUTANEOUS | Status: AC
  Administered 2020-11-08 – 2020-11-13 (×6): 1 via NASAL
  Filled 2020-11-08 (×2): qty 22

## 2020-11-08 MED ORDER — PROSOURCE PLUS PO LIQD
30.0000 mL | Freq: Three times a day (TID) | ORAL | Status: DC
  Administered 2020-11-08 – 2020-11-15 (×14): 30 mL via ORAL
  Filled 2020-11-08 (×15): qty 30

## 2020-11-08 NOTE — Progress Notes (Signed)
PROGRESS NOTE                                                                                                                                                                                                             Patient Demographics:    [name], is a 69 y.o. female, DOB - 1951-07-26, PTW:[id number]  Outpatient Primary MD for the patient is Roetta Sessions, NP    LOS - 1  Admit date - 11/07/2020    Chief Complaint  Patient presents with  . Altered Mental Status       Brief Narrative (HPI from H&P)   FARRIS BLASH is a 69 y.o. female with medical history significant of dementia, CAD, diastolic CHF last EF 22 to 60%, diabetes mellitus type II, AVNRT s/p ablation, and COPD who presented after being found to be more altered over the last 2 to 3 days.  In the ER she was found to have severe dehydration with AKI and admitted to the hospital.   Subjective:    [name] today has, No headache, No chest pain, No abdominal pain - No Nausea, No new weakness tingling or numbness, denies any shortness of breath or discomfort, pleasantly confused but answering questions.   Assessment  & Plan :     1.  Acute metabolic encephalopathy in a patient with advanced dementia.  Brought on by severe dehydration and AKI, hydrate, CT head nonacute, exam nonfocal.  She has no subjective complaints.  Monitor.  2.  Cellulitis with possible sepsis at the time of admission.  She appears nontoxic to me, her legs have chronic discoloration from chronic third spacing of fluids which in turn is caused by severe malnutrition.  She appears afebrile, has mild reactionary leukocytosis, and my examination and evaluation she has no sepsis at this present time.  Will trend procalcitonin, for now we will continue empiric antibiotics till cultures are negative.  Supportive care and monitor.  3.  Severe third spacing of fluid due to extreme  malnutrition.  Protein supplements.  4.  Advanced baseline dementia.  At risk for delirium, minimize narcotics benzodiazepines.  Home dementia meds continued, monitor.  5.  GERD.  PPI.   Condition - Extremely Guarded  Family Communication  : Abner Greenspan [phone] on 11/08/2020 at 9:35 AM, gentle medical treatment, DNR.  No heroics.  Code Status :  DNR  Consults  :  None  Procedures  :    CT - 1. Interval decrease in size of the left cerebral convexity low-density extra-axial collection with 5 mm persistent collection anteriorly. No substantial mass effect or midline shift. 2. 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  PUD Prophylaxis : PPI  Disposition Plan  :    Status is: Inpatient  Remains inpatient appropriate because:IV treatments appropriate due to intensity of illness or inability to take PO   Dispo: The patient is from: SNF              Anticipated d/c is to: SNF              Anticipated d/c date is: 3 days              Patient currently is not medically stable to d/c.  DVT Prophylaxis  :  Heparin    Lab Results  Component Value Date   PLT 261 11/08/2020    Diet :  Diet Order            Diet heart healthy/carb modified Room service appropriate? Yes with Assist; Fluid consistency: Thin  Diet effective now                  Inpatient Medications  Scheduled Meds: . aspirin  81 mg Oral Daily  . buPROPion  150 mg Oral Daily  . DULoxetine  60 mg Oral Daily  . feeding supplement  237 mL Oral BID BM  . heparin  5,000 Units Subcutaneous Q8H  . OLANZapine  2.5 mg Oral QHS  . pantoprazole  40 mg Oral Daily  . sodium chloride flush  3 mL Intravenous Q12H   Continuous Infusions: . cefTRIAXone (ROCEPHIN)  IV    . lactated ringers 100 mL/hr at 11/08/20 0806  . vancomycin     PRN Meds:.acetaminophen **OR** [DISCONTINUED] acetaminophen, albuterol, ondansetron **OR** ondansetron (ZOFRAN) IV  Antibiotics  :     Anti-infectives (From admission, onward)   Start     Dose/Rate Route Frequency Ordered Stop   11/08/20 1200  vancomycin (VANCOREADY) IVPB 500 mg/100 mL        500 mg 100 mL/hr over 60 Minutes Intravenous Every 24 hours 11/07/20 1033     11/08/20 1000  cefTRIAXone (ROCEPHIN) 2 g in sodium chloride 0.9 % 100 mL IVPB        2 g 200 mL/hr over 30 Minutes Intravenous Every 24 hours 11/07/20 1850     11/07/20 1045  vancomycin (VANCOREADY) IVPB 1250 mg/250 mL        1,250 mg 166.7 mL/hr over 90 Minutes Intravenous  Once 11/07/20 1033 11/07/20 1406   11/07/20 1030  vancomycin (VANCOCIN) IVPB 1000 mg/200 mL premix  Status:  Discontinued        1,000 mg 200 mL/hr over 60 Minutes Intravenous  Once 11/07/20 1026 11/07/20 1033   11/07/20 1030  cefTRIAXone (ROCEPHIN) 2 g in sodium chloride 0.9 % 100 mL IVPB        2 g 200 mL/hr over 30 Minutes Intravenous  Once 11/07/20 1026 11/07/20 1131       Time Spent in minutes  30   Lala Lund M.D on 11/08/2020 at 9:36 AM  To page go to [email or web address] - password [email or web address]  [facility] -  Office  [phone]   See all Orders from today for further details    Objective:   Vitals:  11/07/20 2127 11/08/20 0000 11/08/20 0400 11/08/20 0801  BP: (!) 91/51 (!) 104/56 (!) 95/56 (!) 92/59  Pulse: 96 98 100 91  Resp: 20 16 19 15   Temp: 97.9 F (36.6 C) (!) 97.5 F (36.4 C) 98.8 F (37.1 C) 97.6 F (36.4 C)  TempSrc: Oral Oral Axillary Axillary  SpO2: 100% 99% 99% 100%  Weight: 63.7 kg       Wt Readings from Last 3 Encounters:  11/07/20 63.7 kg  10/20/20 62.6 kg  11/12/17 69.4 kg     Intake/Output Summary (Last 24 hours) at 11/08/2020 0936 Last data filed at 11/07/2020 2033 Gross per 24 hour  Intake 4750 ml  Output --  Net 4750 ml     Physical Exam  Awake pleasantly confused, No new F.N deficits, [place].AT,PERRAL Supple Neck,No JVD, No cervical lymphadenopathy appriciated.  Symmetrical Chest wall movement, Good air movement  bilaterally, CTAB RRR,No Gallops,Rubs or new Murmurs, No Parasternal Heave +ve B.Sounds, Abd Soft, No tenderness, No organomegaly appriciated, No rebound - guarding or rigidity. No Cyanosis, Clubbing or edema, No new Rash or bruise     Data Review:    CBC Recent Labs  Lab 11/07/20 0907 11/07/20 0919 11/08/20 0050  WBC 19.2*  --  17.4*  HGB 9.4* 9.9* 9.2*  HCT 30.0* 29.0* 28.7*  PLT 285  --  261  MCV 97.4  --  96.6  MCH 30.5  --  31.0  MCHC 31.3  --  32.1  RDW 14.0  --  14.1  LYMPHSABS 1.5  --   --   MONOABS 0.7  --   --   EOSABS 0.0  --   --   BASOSABS 0.0  --   --     Recent Labs  Lab 11/07/20 0907 11/07/20 0919 11/07/20 1136 11/07/20 1335 11/07/20 1713 11/08/20 0050  NA 140 142  --   --   --  139  K 3.3* 3.2*  --   --   --  3.7  CL 109  --   --   --   --  108  CO2 17*  --   --   --   --  18*  GLUCOSE 174*  --   --   --   --  79  BUN 37*  --   --   --   --  33*  CREATININE 2.09*  --   --   --   --  1.62*  CALCIUM 7.6*  --   --   --   --  7.7*  AST 45*  --   --   --   --  53*  ALT 45*  --   --   --   --  43  ALKPHOS 105  --   --   --   --  117  BILITOT 1.3*  --   --   --   --  1.0  ALBUMIN 1.6*  --   --   --   --  1.5*  MG 1.8  --   --   --   --  1.5*  LATICACIDVEN 2.5*  --  3.7* 2.9* 2.2*  --   INR 1.4*  --   --   --   --   --   AMMONIA 17  --   --   --   --   --   BNP 677.6*  --   --   --   --   --     ------------------------------------------------------------------------------------------------------------------  No results for input(s): CHOL, HDL, LDLCALC, TRIG, CHOLHDL, LDLDIRECT in the last 72 hours.  Lab Results  Component Value Date   HGBA1C 4.8 10/19/2020   ------------------------------------------------------------------------------------------------------------------ No results for input(s): TSH, T4TOTAL, T3FREE, THYROIDAB in the last 72 hours.  Invalid input(s): FREET3  Cardiac Enzymes No results for input(s): CKMB, TROPONINI,  MYOGLOBIN in the last 168 hours.  Invalid input(s): CK ------------------------------------------------------------------------------------------------------------------    Component Value Date/Time   BNP 677.6 (H) 11/07/2020 0907   BNP 71.5 09/27/2014 1134    Micro Results Recent Results (from the past 240 hour(s))  Culture, blood (routine x 2)     Status: None (Preliminary result)   Collection Time: 11/07/20 10:27 AM   Specimen: BLOOD  Result Value Ref Range Status   Specimen Description BLOOD BLOOD RIGHT HAND  Final   Special Requests   Final    BOTTLES DRAWN AEROBIC AND ANAEROBIC Blood Culture adequate volume   Culture   Final    NO GROWTH < 24 HOURS Performed at [facility], [address] [place], [place] [address]    Report Status PENDING  Incomplete  Respiratory Panel by RT PCR (Flu A&B, Covid) - Nasopharyngeal Swab     Status: None   Collection Time: 11/07/20 10:30 AM   Specimen: Nasopharyngeal Swab; Nasopharyngeal(NP) swabs in vial transport medium  Result Value Ref Range Status   SARS Coronavirus 2 by RT PCR NEGATIVE NEGATIVE Final    Comment: (NOTE) SARS-CoV-2 target nucleic acids are NOT DETECTED.  The SARS-CoV-2 RNA is generally detectable in upper respiratoy specimens during the acute phase of infection. The lowest concentration of SARS-CoV-2 viral copies this assay can detect is 131 copies/mL. A negative result does not preclude SARS-Cov-2 infection and should not be used as the sole basis for treatment or other patient management decisions. A negative result may occur with  improper specimen collection/handling, submission of specimen other than nasopharyngeal swab, presence of viral mutation(s) within the areas targeted by this assay, and inadequate number of viral copies (<131 copies/mL). A negative result must be combined with clinical observations, patient history, and epidemiological information. The expected result is Negative.  Fact Sheet  for Patients:  PinkCheek.be  Fact Sheet for Healthcare Providers:  GravelBags.it  This test is no t yet approved or cleared by the [place] FDA and  has been authorized for detection and/or diagnosis of SARS-CoV-2 by FDA under an Emergency Use Authorization (EUA). This EUA will remain  in effect (meaning this test can be used) for the duration of the COVID-19 declaration under Section 564(b)(1) of the Act, 21 U.S.C. section 360bbb-3(b)(1), unless the authorization is terminated or revoked sooner.     Influenza A by PCR NEGATIVE NEGATIVE Final   Influenza B by PCR NEGATIVE NEGATIVE Final    Comment: (NOTE) The Xpert Xpress SARS-CoV-2/FLU/RSV assay is intended as an aid in  the diagnosis of influenza from Nasopharyngeal swab specimens and  should not be used as a sole basis for treatment. Nasal washings and  aspirates are unacceptable for Xpert Xpress SARS-CoV-2/FLU/RSV  testing.  Fact Sheet for Patients: PinkCheek.be  Fact Sheet for Healthcare Providers: GravelBags.it  This test is not yet approved or cleared by the [place] FDA and  has been authorized for detection and/or diagnosis of SARS-CoV-2 by  FDA under an Emergency Use Authorization (EUA). This EUA will remain  in effect (meaning this test can be used) for the duration of the  Covid-19 declaration under Section 564(b)(1) of the  Act, 21  U.S.C. section 360bbb-3(b)(1), unless the authorization is  terminated or revoked. Performed at [facility], [place] [address], [place], [place] [address]   Culture, blood (routine x 2)     Status: None (Preliminary result)   Collection Time: 11/07/20 10:41 AM   Specimen: BLOOD  Result Value Ref Range Status   Specimen Description BLOOD SITE NOT SPECIFIED  Final   Special Requests   Final    BOTTLES DRAWN AEROBIC AND ANAEROBIC Blood Culture results may  not be optimal due to an excessive volume of blood received in culture bottles   Culture   Final    NO GROWTH < 24 HOURS Performed at [facility], [place] [address] [place], [place] [address]    Report Status PENDING  Incomplete    Radiology Reports CT Head Wo Contrast  Result Date: 11/07/2020 CLINICAL DATA:  Subdural hematoma. EXAM: CT HEAD WITHOUT CONTRAST TECHNIQUE: Contiguous axial images were obtained from the base of the skull through the vertex without intravenous contrast. COMPARISON:  CT head October 17, 2020. FINDINGS: Brain: Interval decrease in size of the left cerebral convexity low-density extra-axial collection with 6 mm persistent collection anteriorly (for example series 5, images 26 and 28). No substantial mass effect or midline shift. No evidence of acute large vascular territory infarct. Remote right frontal infarct. Similar generalized cerebral atrophy with patchy white matter hypoattenuation, compatible with chronic microvascular ischemic disease. No hydrocephalus. No mass lesion. Vascular: Calcific atherosclerosis. Skull: No acute fracture. Sinuses/Orbits: Ethmoid air cell mucosal thickening. Otherwise, sinuses are clear. Unremarkable orbits. Other: No mastoid effusions. IMPRESSION: 1. Interval decrease in size of the left cerebral convexity low-density extra-axial collection with 5 mm persistent collection anteriorly. No substantial mass effect or midline shift. 2. 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 Electronically Signed   By: Margaretha Sheffield MD   On: 11/07/2020 10:06   CT Head Wo Contrast  Result Date: 10/17/2020 CLINICAL DATA:  Mental status change.  Recent fall. EXAM: CT HEAD WITHOUT CONTRAST TECHNIQUE: Contiguous axial images were obtained from the base of the skull through the vertex without intravenous contrast. COMPARISON:  10/15/2020. FINDINGS: Brain: Interval development of a low-density, approximately 7 mm thick subdural  fluid collection along the left cerebral convexity. Mild associated mass effect with approximately 3 mm of rightward midline shift. Remote right frontal infarct. Similar generalized cerebral atrophy and chronic microvascular ischemic change. No hydrocephalus. No mass lesion. No evidence of acute hemorrhage. No evidence of acute large vascular territory infarct. Vascular: Calcific atherosclerosis. Skull: No acute fracture. Sinuses/Orbits: Mucosal thickening and opacification of anterior right ethmoid air cells. Unremarkable orbits. Other: No mastoid effusions. IMPRESSION: Interval development of a low-density, approximately 7 mm thick subdural fluid collection along the left cerebral convexity. Mild associated mass effect with 3 mm of rightward midline shift. Electronically Signed   By: Margaretha Sheffield MD   On: 10/17/2020 13:11   CT Head Wo Contrast  Result Date: 10/15/2020 CLINICAL DATA:  Unwitnessed fall EXAM: CT HEAD WITHOUT CONTRAST TECHNIQUE: Contiguous axial images were obtained from the base of the skull through the vertex without intravenous contrast. COMPARISON:  09/01/2017 FINDINGS: Brain: Old right frontal infarct. There is atrophy and chronic small vessel disease changes. No acute intracranial abnormality. Specifically, no hemorrhage, hydrocephalus, mass lesion, acute infarction, or significant intracranial injury. Vascular: No hyperdense vessel or unexpected calcification. Skull: No acute calvarial abnormality. Sinuses/Orbits: No acute findings Other: None IMPRESSION: Old right frontal infarct. Atrophy, chronic microvascular  disease. No acute intracranial abnormality. Electronically Signed   By: Rolm Baptise M.D.   On: 10/15/2020 19:05   CT Cervical Spine Wo Contrast  Result Date: 10/15/2020 CLINICAL DATA:  Unwitnessed fall EXAM: CT CERVICAL SPINE WITHOUT CONTRAST TECHNIQUE: Multidetector CT imaging of the cervical spine was performed without intravenous contrast. Multiplanar CT image  reconstructions were also generated. COMPARISON:  None. FINDINGS: Alignment: No subluxation. Skull base and vertebrae: No acute fracture. No primary bone lesion or focal pathologic process. Soft tissues and spinal canal: No prevertebral fluid or swelling. No visible canal hematoma. Disc levels: Disc spaces are maintained. Diffuse degenerative facet disease bilaterally. Upper chest: No acute findings Other: None IMPRESSION: No acute bony abnormality. Electronically Signed   By: Rolm Baptise M.D.   On: 10/15/2020 19:07   DG Chest Port 1 View  Result Date: 11/07/2020 CLINICAL DATA:  Altered mental status. EXAM: PORTABLE CHEST 1 VIEW COMPARISON:  October 17, 2020. FINDINGS: Stable cardiomediastinal silhouette. Status post coronary bypass graft. No pneumothorax or pleural effusion is noted. Lungs are clear. Bony thorax is unremarkable. IMPRESSION: No active disease. Electronically Signed   By: Marijo Conception M.D.   On: 11/07/2020 11:01   DG Chest Port 1 View  Result Date: 10/17/2020 CLINICAL DATA:  Shortness of breath. EXAM: PORTABLE CHEST 1 VIEW COMPARISON:  September 01, 2017. FINDINGS: The heart size and mediastinal contours are within normal limits. No pneumothorax or pleural effusion is noted. Status post coronary bypass graft. Both lungs are clear. The visualized skeletal structures are unremarkable. IMPRESSION: No active disease. Electronically Signed   By: Marijo Conception M.D.   On: 10/17/2020 12:45

## 2020-11-08 NOTE — Progress Notes (Addendum)
Occupational Therapy Evaluation Patient Details Name: [name] MRN: [id number] DOB: 1951/10/25 Today's Date: 11/08/2020    History of Present Illness Paula HELSER is a 69 y.o. female with medical history significant of dementia, CAD, diastolic CHF last EF 51 to 60%, diabetes mellitus type II, AVNRT s/p ablation, and COPD who presented after being found to be more altered over the last 2 to 3 days.  In the ER she was found to have severe dehydration with AKI and admitted to the hospital.  Pavo on 10/30 at ALF with hospital admission. Returned to ALF.    Clinical Impression   Pt apparently from [facility]F, where per chart review, pt has had a steady decline. Pt oriented only to self. Able to feed self after set up however will need assistance with meals to increase adequate PO intake. Attempted to mobilize pt, however pt would moan and yell in apparent pain/discomfort with any attempts at movement. Will need +2 for any mobility/most likely use of lift equipment. Pt with multiple wounds BU/LE. Pt appropriate for SNF unless family prefers for pt to return to ALF where she will most likely require total care for ADL and mobility. Will follow acutely    Follow Up Recommendations  SNF    Equipment Recommendations  None recommended by OT    Recommendations for Other Services  (palliative)     Precautions / Restrictions Precautions Precautions: Fall Precaution Comments: fragile skin; multiple wounds Required Braces or Orthoses:  (bandage R lower leg)      Mobility Bed Mobility Overal bed mobility: Needs Assistance Bed Mobility: Rolling Rolling: Max assist         General bed mobility comments: Attempted to move to EOB; will need +2 assistance    Transfers                 General transfer comment: unable to complete this date    Balance Overall balance assessment: History of Falls                                         ADL either  performed or assessed with clinical judgement   ADL Overall ADL's : Needs assistance/impaired Eating/Feeding: Set up;Supervision/ safety;Sitting Eating/Feeding Details (indicate cue type and reason): feeding self after set up; will need assistance to increase adequate PO intake Grooming: Moderate assistance;Sitting   Upper Body Bathing: Sitting;Maximal assistance   Lower Body Bathing: Total assistance;Bed level   Upper Body Dressing : Maximal assistance;Sitting   Lower Body Dressing: Total assistance;Bed level       Toileting- Clothing Manipulation and Hygiene: Total assistance       Functional mobility during ADLs:  (attempting however pt moaning with any movement)       Vision         Perception     Praxis      Pertinent Vitals/Pain Pain Assessment: Faces Faces Pain Scale: Hurts worst Pain Location: pain with movement or touching BLE/UE Pain Descriptors / Indicators: Discomfort;Grimacing;Guarding;Moaning Pain Intervention(s): Limited activity within patient's tolerance;Repositioned (RN notified)     Hand Dominance Right   Extremity/Trunk Assessment Upper Extremity Assessment Upper Extremity Assessment: Generalized weakness (limited B shoulder FF @ 90; using functionally)   Lower Extremity Assessment Lower Extremity Assessment: Defer to PT evaluation   Cervical / Trunk Assessment Cervical / Trunk Assessment: Kyphotic   Communication Communication Communication: HOH;Expressive difficulties  Cognition Arousal/Alertness: Lethargic Behavior During Therapy: Restless;Anxious Overall Cognitive Status: No family/caregiver present to determine baseline cognitive functioning Area of Impairment: Orientation;Attention;Memory;Following commands;Safety/judgement;Awareness;Problem solving                 Orientation Level: Disoriented to;Place;Time;Situation Current Attention Level: Focused Memory: Decreased short-term memory Following Commands: Follows one  step commands inconsistently Safety/Judgement: Decreased awareness of safety;Decreased awareness of deficits Awareness: Intellectual Problem Solving: Slow processing;Decreased initiation;Difficulty sequencing;Requires verbal cues;Requires tactile cues General Comments: Dementia at baseline. Apparently hallucinating seeing a table in her hand   General Comments  Bandage on R lower leg; L lower leg weeping; B heels floated on pillows BUE weeping; increased alertness once upright in bed and TV turned on    Exercises     Shoulder Instructions      Home Living Family/patient expects to be discharged to:: Skilled nursing facility                                        Prior Functioning/Environment Level of Independence: Needs assistance  Gait / Transfers Assistance Needed: per chart review ambulated @ 10 ft on last admission @ 11/2 ADL's / Homemaking Assistance Needed: Needs asssistance for ADLs. Communication / Swallowing Assistance Needed: HOH; expressive difficulties          OT Problem List: Decreased strength;Decreased range of motion;Decreased activity tolerance;Impaired balance (sitting and/or standing);Decreased coordination;Decreased cognition;Decreased safety awareness;Decreased knowledge of use of DME or AE;Cardiopulmonary status limiting activity;Impaired UE functional use;Pain;Increased edema      OT Treatment/Interventions: Self-care/ADL training;Therapeutic exercise;DME and/or AE instruction;Therapeutic activities;Cognitive remediation/compensation;Patient/family education;Balance training    OT Goals(Current goals can be found in the care plan section) Acute Rehab OT Goals Patient Stated Goal: unable to state OT Goal Formulation: Patient unable to participate in goal setting Time For Goal Achievement: 11/22/20 Potential to Achieve Goals: Fair  OT Frequency: Min 2X/week   Barriers to D/C:            Co-evaluation              AM-PAC OT "6  Clicks" Daily Activity     Outcome Measure Help from another person eating meals?: A Little Help from another person taking care of personal grooming?: A Lot Help from another person toileting, which includes using toliet, bedpan, or urinal?: Total Help from another person bathing (including washing, rinsing, drying)?: Total Help from another person to put on and taking off regular upper body clothing?: A Lot Help from another person to put on and taking off regular lower body clothing?: Total 6 Click Score: 10   End of Session Nurse Communication: Mobility status (pain with mobility)  Activity Tolerance: Patient limited by fatigue;Patient limited by pain Patient left: in bed;with call bell/phone within reach;with bed alarm set (modified chair position)  OT Visit Diagnosis: Other abnormalities of gait and mobility (R26.89);Muscle weakness (generalized) (M62.81);History of falling (Z91.81);Other symptoms and signs involving cognitive function;Pain Pain - Right/Left:  (generalized)                Time: 8144-8185 OT Time Calculation (min): 30 min Charges:  OT General Charges $OT Visit: 1 Visit OT Evaluation $OT Eval Moderate Complexity: 1 Mod OT Treatments $Self Care/Home Management : 8-22 mins  Maurie Boettcher, OT/L   Acute OT Clinical Specialist [place] Pager [phone] Office [phone]   [facility] 11/08/2020, 10:44 AM

## 2020-11-08 NOTE — Progress Notes (Signed)
Consulted for PIV insertion, pt bil arms assessed, swollen with multiple cuts, bruises and  with very poor veins. Current PIV inserted to LAFA, As per recommendation need central line.

## 2020-11-08 NOTE — Plan of Care (Signed)
  Problem: Skin Integrity: Goal: Risk for impaired skin integrity will decrease Outcome: Progressing   Problem: Safety: Goal: Ability to remain free from injury will improve Outcome: Progressing   Problem: Pain Managment: Goal: General experience of comfort will improve Outcome: Progressing   Problem: Coping: Goal: Level of anxiety will decrease Outcome: Progressing   

## 2020-11-08 NOTE — Evaluation (Signed)
Physical Therapy Evaluation Patient Details Name: [name] MRN: [id number] DOB: 1951/03/14 Today's Date: 11/08/2020   History of Present Illness  [name] is a 69 y.o. female with medical history significant of dementia, CAD, diastolic CHF last EF 75 to 60%, diabetes mellitus type II, AVNRT s/p ablation, and COPD who presented after being found to be more altered over the last 2 to 3 days.  In the ER she was found to have severe dehydration with AKI and admitted to the hospital.  [place] on 10/30 at ALF with hospital admission. Returned to ALF.   Clinical Impression  Pt admitted with above diagnosis. Pt was confused and unable to provide any history.  She demonstrated signs of pain with moaning with any attempts to assist with movements or with touch, but was able to actively move extremities with cues without signs of pain. Started to transfer to EOB and pt assisting but mid-ways began moaning and stated "no." Pt is from SNF - recommend SNF at discharge. Pt currently with functional limitations due to the deficits listed below (see PT Problem List). Pt will benefit from skilled PT to increase their independence and safety with mobility to allow discharge to the venue listed below.       Follow Up Recommendations SNF    Equipment Recommendations  None recommended by PT    Recommendations for Other Services       Precautions / Restrictions Precautions Precautions: Fall Precaution Comments: fragile skin; multiple wounds Required Braces or Orthoses:  (bandage R lower leg)      Mobility  Bed Mobility Overal bed mobility: Needs Assistance Bed Mobility: Rolling Rolling: Max assist   Supine to sit: Max assist     General bed mobility comments: Partial supine/sit - assisted pt with moving legs to EOB and she grabbed therapist hands and half way lifted trunk but then stated "oh oh oh I don't want to." Returned to supine and max x 2 to pull up in bed    Transfers                  General transfer comment: unable to complete this date  Ambulation/Gait                Stairs            Wheelchair Mobility    Modified Rankin (Stroke Patients Only)       Balance Overall balance assessment: History of Falls                                           Pertinent Vitals/Pain Pain Assessment: Faces Faces Pain Scale: Hurts even more Pain Location: Pt moaning when touched and assisted with extremity movement - but was able to do on her own without signs of pain; when asked if in pain states "no" Pain Descriptors / Indicators: Discomfort;Grimacing;Guarding;Moaning Pain Intervention(s): Limited activity within patient's tolerance;Repositioned    Home Living Family/patient expects to be discharged to:: Skilled nursing facility                 Additional Comments: Pt unable to provide PLOF.  Per chart pt from [facility]    Prior Function Level of Independence: Needs assistance   Gait / Transfers Assistance Needed: per chart review ambulated @ 10 ft on last admission @ 11/2  ADL's / Homemaking Assistance Needed: Needs  asssistance for ADLs.  Comments: Pt unable to provide PLOF     Hand Dominance   Dominant Hand: Right    Extremity/Trunk Assessment   Upper Extremity Assessment Upper Extremity Assessment: Defer to OT evaluation    Lower Extremity Assessment Lower Extremity Assessment: LLE deficits/detail;RLE deficits/detail;Difficult to assess due to impaired cognition RLE Deficits / Details: ROM WFL; Demonstrating 3/5 MMT throughout but unable to further test LLE Deficits / Details: ROM WFL; Demonstrating 3/5 MMT throughout but unable to further test    Cervical / Trunk Assessment Cervical / Trunk Assessment: Kyphotic  Communication   Communication: HOH;Expressive difficulties  Cognition Arousal/Alertness: Lethargic Behavior During Therapy: Restless;Anxious Overall Cognitive Status: No  family/caregiver present to determine baseline cognitive functioning Area of Impairment: Orientation;Attention;Memory;Following commands;Safety/judgement;Awareness;Problem solving                 Orientation Level: Disoriented to;Place;Time;Situation Current Attention Level: Focused Memory: Decreased short-term memory Following Commands: Follows one step commands inconsistently Safety/Judgement: Decreased awareness of safety;Decreased awareness of deficits Awareness: Intellectual Problem Solving: Slow processing;Decreased initiation;Difficulty sequencing;Requires verbal cues;Requires tactile cues General Comments: Dementia at baseline.  Pt moaning with attempts to assist with movement but when she does on her own no signs of pain.      General Comments General comments (skin integrity, edema, etc.): Bandage on R lower leg; L lower leg weeping; BUE weeping    Exercises General Exercises - Lower Extremity Ankle Circles/Pumps: AROM;Both;10 reps;Supine Heel Slides: AAROM;Both;5 reps;Supine Hip ABduction/ADduction: AAROM;5 reps;Both;Supine   Assessment/Plan    PT Assessment Patient needs continued PT services  PT Problem List Decreased strength;Decreased mobility;Decreased activity tolerance;Decreased balance;Decreased knowledge of use of DME;Decreased cognition;Decreased safety awareness;Decreased range of motion;Pain       PT Treatment Interventions DME instruction;Gait training;Therapeutic activities;Therapeutic exercise;Patient/family education;Balance training;Functional mobility training    PT Goals (Current goals can be found in the Care Plan section)  Acute Rehab PT Goals Patient Stated Goal: unable to state PT Goal Formulation: Patient unable to participate in goal setting Time For Goal Achievement: 11/22/20 Potential to Achieve Goals: Fair    Frequency Min 2X/week   Barriers to discharge        Co-evaluation               AM-PAC PT "6 Clicks" Mobility   Outcome Measure Help needed turning from your back to your side while in a flat bed without using bedrails?: A Little Help needed moving from lying on your back to sitting on the side of a flat bed without using bedrails?: A Lot Help needed moving to and from a bed to a chair (including a wheelchair)?: Total Help needed standing up from a chair using your arms (e.g., wheelchair or bedside chair)?: Total Help needed to walk in hospital room?: Total Help needed climbing 3-5 steps with a railing? : Total 6 Click Score: 9    End of Session   Activity Tolerance: Other (comment) (limited by pain/confusion) Patient left: with call bell/phone within reach;in bed;with bed alarm set Nurse Communication: Mobility status PT Visit Diagnosis: History of falling (Z91.81);Muscle weakness (generalized) (M62.81);Difficulty in walking, not elsewhere classified (R26.2)    Time: 1150-1210 PT Time Calculation (min) (ACUTE ONLY): 20 min   Charges:   PT Evaluation $PT Eval Moderate Complexity: 1 Melina Schools, PT Acute Rehab Services Pager [phone] [facility] [phone]    Karlton Lemon 11/08/2020, 12:20 PM

## 2020-11-08 NOTE — Progress Notes (Addendum)
CSW contacted [facility]/ALF. Their secretary will ask patient's nurse to contact CSW to discuss discharge plan. CSW also left voicemail for patient's brother, [name].   Paula Reber LCSW

## 2020-11-08 NOTE — Outcomes Assessment (Signed)
Pt admitted last night to [facility] with a report from ER nurse stating patient was COVID positive. As of this morning charge nurse realized pt is COVID negative after approval of patient to floor. Pt being transferred to non-covid side safely on other side of hallway on [facility]. Pt is in no distress. All PPE was used throughout the duration of taking care of this pt. VSS.

## 2020-11-09 ENCOUNTER — Inpatient Hospital Stay (HOSPITAL_COMMUNITY): Payer: Medicare Other

## 2020-11-09 ENCOUNTER — Encounter (HOSPITAL_COMMUNITY): Payer: Federal, State, Local not specified - PPO

## 2020-11-09 DIAGNOSIS — R7989 Other specified abnormal findings of blood chemistry: Secondary | ICD-10-CM | POA: Diagnosis not present

## 2020-11-09 DIAGNOSIS — R6520 Severe sepsis without septic shock: Secondary | ICD-10-CM | POA: Diagnosis not present

## 2020-11-09 DIAGNOSIS — A419 Sepsis, unspecified organism: Secondary | ICD-10-CM | POA: Diagnosis not present

## 2020-11-09 LAB — CBC WITH DIFFERENTIAL/PLATELET
Abs Immature Granulocytes: 0.08 10*3/uL — ABNORMAL HIGH (ref 0.00–0.07)
Basophils Absolute: 0 10*3/uL (ref 0.0–0.1)
Basophils Relative: 0 %
Eosinophils Absolute: 0.1 10*3/uL (ref 0.0–0.5)
Eosinophils Relative: 1 %
HCT: 25.4 % — ABNORMAL LOW (ref 36.0–46.0)
Hemoglobin: 8 g/dL — ABNORMAL LOW (ref 12.0–15.0)
Immature Granulocytes: 1 %
Lymphocytes Relative: 15 %
Lymphs Abs: 1.6 10*3/uL (ref 0.7–4.0)
MCH: 30.7 pg (ref 26.0–34.0)
MCHC: 31.5 g/dL (ref 30.0–36.0)
MCV: 97.3 fL (ref 80.0–100.0)
Monocytes Absolute: 0.5 10*3/uL (ref 0.1–1.0)
Monocytes Relative: 5 %
Neutro Abs: 8.2 10*3/uL — ABNORMAL HIGH (ref 1.7–7.7)
Neutrophils Relative %: 78 %
Platelets: 187 10*3/uL (ref 150–400)
RBC: 2.61 MIL/uL — ABNORMAL LOW (ref 3.87–5.11)
RDW: 13.9 % (ref 11.5–15.5)
WBC: 10.5 10*3/uL (ref 4.0–10.5)
nRBC: 0 % (ref 0.0–0.2)

## 2020-11-09 LAB — C-REACTIVE PROTEIN: CRP: 4.6 mg/dL — ABNORMAL HIGH (ref <1.0)

## 2020-11-09 LAB — COMPREHENSIVE METABOLIC PANEL
ALT: 44 U/L (ref 0–44)
AST: 46 U/L — ABNORMAL HIGH (ref 15–41)
Albumin: 1.2 g/dL — ABNORMAL LOW (ref 3.5–5.0)
Alkaline Phosphatase: 96 U/L (ref 38–126)
Anion gap: 12 (ref 5–15)
BUN: 29 mg/dL — ABNORMAL HIGH (ref 8–23)
CO2: 19 mmol/L — ABNORMAL LOW (ref 22–32)
Calcium: 7.4 mg/dL — ABNORMAL LOW (ref 8.9–10.3)
Chloride: 108 mmol/L (ref 98–111)
Creatinine, Ser: 1.13 mg/dL — ABNORMAL HIGH (ref 0.44–1.00)
GFR, Estimated: 52 mL/min — ABNORMAL LOW (ref >60)
Glucose, Bld: 67 mg/dL — ABNORMAL LOW (ref 70–99)
Potassium: 3 mmol/L — ABNORMAL LOW (ref 3.5–5.1)
Sodium: 139 mmol/L (ref 135–145)
Total Bilirubin: 0.9 mg/dL (ref 0.3–1.2)
Total Protein: 3.3 g/dL — ABNORMAL LOW (ref 6.5–8.1)

## 2020-11-09 LAB — PROCALCITONIN: Procalcitonin: 0.25 ng/mL

## 2020-11-09 LAB — MAGNESIUM: Magnesium: 1.8 mg/dL (ref 1.7–2.4)

## 2020-11-09 LAB — D-DIMER, QUANTITATIVE: D-Dimer, Quant: 1.49 ug/mL-FEU — ABNORMAL HIGH (ref 0.00–0.50)

## 2020-11-09 LAB — BRAIN NATRIURETIC PEPTIDE: B Natriuretic Peptide: 824.3 pg/mL — ABNORMAL HIGH (ref 0.0–100.0)

## 2020-11-09 MED ORDER — DOXYCYCLINE HYCLATE 100 MG PO TABS
100.0000 mg | ORAL_TABLET | Freq: Two times a day (BID) | ORAL | Status: DC
  Administered 2020-11-09 – 2020-11-12 (×5): 100 mg via ORAL
  Filled 2020-11-09 (×5): qty 1

## 2020-11-09 MED ORDER — VANCOMYCIN HCL 750 MG/150ML IV SOLN
750.0000 mg | INTRAVENOUS | Status: DC
  Administered 2020-11-09: 750 mg via INTRAVENOUS
  Filled 2020-11-09: qty 150

## 2020-11-09 MED ORDER — POTASSIUM CHLORIDE CRYS ER 20 MEQ PO TBCR
40.0000 meq | EXTENDED_RELEASE_TABLET | Freq: Four times a day (QID) | ORAL | Status: AC
  Administered 2020-11-09 (×2): 40 meq via ORAL
  Filled 2020-11-09 (×2): qty 2

## 2020-11-09 NOTE — Progress Notes (Signed)
PROGRESS NOTE                                                                                                                                                                                                             Patient Demographics:    [name], is a 69 y.o. female, DOB - December 03, 1951, EZM:[id number]  Outpatient Primary MD for the patient is Roetta Sessions, NP    LOS - 2  Admit date - 11/07/2020    Chief Complaint  Patient presents with  . Altered Mental Status       Brief Narrative (HPI from H&P)   [name] is a 69 y.o. female with medical history significant of dementia, CAD, diastolic CHF last EF 26 to 60%, diabetes mellitus type II, AVNRT s/p ablation, and COPD who presented after being found to be more altered over the last 2 to 3 days.  In the ER she was found to have severe dehydration with AKI and admitted to the hospital.   Subjective:   Patient in bed, mildly confused but denies any headache, no chest or abdominal pain.  No shortness of breath.   Assessment  & Plan :     1.  Acute metabolic encephalopathy in a patient with advanced dementia.  Brought on by severe dehydration and AKI, hydrate, CT head nonacute, exam nonfocal.  She has no subjective complaints.  Monitor.  2.  Cellulitis with possible sepsis at the time of admission.  She appears nontoxic to me, her legs have chronic discoloration from chronic third spacing of fluids which in turn is caused by severe malnutrition.  She appears afebrile, has mild reactionary leukocytosis, and my examination and evaluation she has no sepsis at this present time.  Will trend procalcitonin, blood cultures thus far are negative will switch to oral doxycycline for another 3 to 4 days and monitor.  Supportive care and monitor.  3.  Severe third spacing of fluid due to extreme malnutrition.  Protein supplements.  4.  Advanced baseline dementia.   At risk for delirium, minimize narcotics benzodiazepines.  Home dementia meds continued, monitor.  5.  GERD.  PPI.  6.  Hypokalemia.  Replaced.  7.  Mildly elevated D-dimer.  Likely due to inflammation, negative venous duplex.    Condition - Extremely Guarded  Family Communication  : Brother  [name] [phone] on 11/08/2020 at 9:35 AM, gentle medical treatment, DNR.  No heroics.  Code Status :  DNR  Consults  :  None  Procedures  :    Leg venous ultrasound.  No DVT.    CT - 1. Interval decrease in size of the left cerebral convexity low-density extra-axial collection with 5 mm persistent collection anteriorly. No substantial mass effect or midline shift. 2. 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  PUD Prophylaxis : PPI  Disposition Plan  :    Status is: Inpatient  Remains inpatient appropriate because:IV treatments appropriate due to intensity of illness or inability to take PO   Dispo: The patient is from: SNF              Anticipated d/c is to: SNF              Anticipated d/c date is: 3 days              Patient currently is not medically stable to d/c.  DVT Prophylaxis  :  Heparin    Lab Results  Component Value Date   PLT 187 11/09/2020    Diet :  Diet Order            Diet heart healthy/carb modified Room service appropriate? Yes with Assist; Fluid consistency: Thin  Diet effective now                  Inpatient Medications  Scheduled Meds: . (feeding supplement) PROSource Plus  30 mL Oral TID PC & HS  . aspirin  81 mg Oral Daily  . buPROPion  150 mg Oral Daily  . Chlorhexidine Gluconate Cloth  6 each Topical Q0600  . DULoxetine  60 mg Oral Daily  . feeding supplement  237 mL Oral BID BM  . heparin  5,000 Units Subcutaneous Q8H  . mupirocin ointment  1 application Nasal BID  . OLANZapine  2.5 mg Oral QHS  . pantoprazole  40 mg Oral Daily  . potassium chloride  40 mEq Oral Q6H  . sodium chloride flush  3 mL  Intravenous Q12H   Continuous Infusions: . cefTRIAXone (ROCEPHIN)  IV 2 g (11/09/20 0905)  . vancomycin 750 mg (11/09/20 1158)   PRN Meds:.acetaminophen **OR** [DISCONTINUED] acetaminophen, albuterol, [DISCONTINUED] ondansetron **OR** ondansetron (ZOFRAN) IV  Antibiotics  :    Anti-infectives (From admission, onward)   Start     Dose/Rate Route Frequency Ordered Stop   11/09/20 1200  vancomycin (VANCOREADY) IVPB 750 mg/150 mL        750 mg 150 mL/hr over 60 Minutes Intravenous Every 24 hours 11/09/20 0846     11/08/20 1200  vancomycin (VANCOREADY) IVPB 500 mg/100 mL  Status:  Discontinued        500 mg 100 mL/hr over 60 Minutes Intravenous Every 24 hours 11/07/20 1033 11/09/20 0846   11/08/20 1000  cefTRIAXone (ROCEPHIN) 2 g in sodium chloride 0.9 % 100 mL IVPB        2 g 200 mL/hr over 30 Minutes Intravenous Every 24 hours 11/07/20 1850     11/07/20 1045  vancomycin (VANCOREADY) IVPB 1250 mg/250 mL        1,250 mg 166.7 mL/hr over 90 Minutes Intravenous  Once 11/07/20 1033 11/07/20 1406   11/07/20 1030  vancomycin (VANCOCIN) IVPB 1000 mg/200 mL premix  Status:  Discontinued        1,000 mg 200 mL/hr over 60 Minutes Intravenous  Once 11/07/20  1026 11/07/20 1033   11/07/20 1030  cefTRIAXone (ROCEPHIN) 2 g in sodium chloride 0.9 % 100 mL IVPB        2 g 200 mL/hr over 30 Minutes Intravenous  Once 11/07/20 1026 11/07/20 1131       Time Spent in minutes  30   Lala Lund M.D on 11/09/2020 at 2:10 PM  To page go to [email or web address] - password [facility]  [facility] -  Office  [phone]   See all Orders from today for further details    Objective:   Vitals:   11/09/20 0134 11/09/20 0300 11/09/20 0400 11/09/20 0517  BP: 103/62  (!) 98/53 110/64  Pulse: 93 88 75   Resp: 15 16 15    Temp:    97.9 F (36.6 C)  TempSrc:    Axillary  SpO2: 99% 98% 99%   Weight:    64 kg    Wt Readings from Last 3 Encounters:  11/09/20 64 kg  10/20/20 62.6 kg  11/12/17 69.4 kg      Intake/Output Summary (Last 24 hours) at 11/09/2020 1410 Last data filed at 11/09/2020 0300 Gross per 24 hour  Intake 1656.81 ml  Output 350 ml  Net 1306.81 ml     Physical Exam  Awake pleasantly confused, No new F.N deficits, [place].AT,PERRAL Supple Neck,No JVD, No cervical lymphadenopathy appriciated.  Symmetrical Chest wall movement, Good air movement bilaterally, CTAB RRR,No Gallops, Rubs or new Murmurs, No Parasternal Heave +ve B.Sounds, Abd Soft, No tenderness, No organomegaly appriciated, No rebound - guarding or rigidity. No Cyanosis, chronic third spacing with 1-2+ leg edema     Data Review:    CBC Recent Labs  Lab 11/07/20 0907 11/07/20 0919 11/08/20 0050 11/09/20 0100  WBC 19.2*  --  17.4* 10.5  HGB 9.4* 9.9* 9.2* 8.0*  HCT 30.0* 29.0* 28.7* 25.4*  PLT 285  --  261 187  MCV 97.4  --  96.6 97.3  MCH 30.5  --  31.0 30.7  MCHC 31.3  --  32.1 31.5  RDW 14.0  --  14.1 13.9  LYMPHSABS 1.5  --   --  1.6  MONOABS 0.7  --   --  0.5  EOSABS 0.0  --   --  0.1  BASOSABS 0.0  --   --  0.0    Recent Labs  Lab 11/07/20 0907 11/07/20 0919 11/07/20 1136 11/07/20 1335 11/07/20 1713 11/08/20 0050 11/09/20 0100  NA 140 142  --   --   --  139 139  K 3.3* 3.2*  --   --   --  3.7 3.0*  CL 109  --   --   --   --  108 108  CO2 17*  --   --   --   --  18* 19*  GLUCOSE 174*  --   --   --   --  79 67*  BUN 37*  --   --   --   --  33* 29*  CREATININE 2.09*  --   --   --   --  1.62* 1.13*  CALCIUM 7.6*  --   --   --   --  7.7* 7.4*  AST 45*  --   --   --   --  53* 46*  ALT 45*  --   --   --   --  43 44  ALKPHOS 105  --   --   --   --  117  96  BILITOT 1.3*  --   --   --   --  1.0 0.9  ALBUMIN 1.6*  --   --   --   --  1.5* 1.2*  MG 1.8  --   --   --   --  1.5* 1.8  CRP  --   --   --   --   --   --  4.6*  DDIMER  --   --   --   --   --   --  1.49*  PROCALCITON  --   --   --   --   --  0.61 0.25  LATICACIDVEN 2.5*  --  3.7* 2.9* 2.2*  --   --   INR 1.4*  --   --    --   --   --   --   AMMONIA 17  --   --   --   --   --   --   BNP 677.6*  --   --   --   --   --  824.3*    ------------------------------------------------------------------------------------------------------------------ No results for input(s): CHOL, HDL, LDLCALC, TRIG, CHOLHDL, LDLDIRECT in the last 72 hours.  Lab Results  Component Value Date   HGBA1C 4.8 10/19/2020   ------------------------------------------------------------------------------------------------------------------ No results for input(s): TSH, T4TOTAL, T3FREE, THYROIDAB in the last 72 hours.  Invalid input(s): FREET3  Cardiac Enzymes No results for input(s): CKMB, TROPONINI, MYOGLOBIN in the last 168 hours.  Invalid input(s): CK ------------------------------------------------------------------------------------------------------------------    Component Value Date/Time   BNP 824.3 (H) 11/09/2020 0100   BNP 71.5 09/27/2014 1134    Micro Results Recent Results (from the past 240 hour(s))  Culture, blood (routine x 2)     Status: None (Preliminary result)   Collection Time: 11/07/20 10:27 AM   Specimen: BLOOD  Result Value Ref Range Status   Specimen Description BLOOD BLOOD RIGHT HAND  Final   Special Requests   Final    BOTTLES DRAWN AEROBIC AND ANAEROBIC Blood Culture adequate volume   Culture   Final    NO GROWTH 2 DAYS Performed at [facility], [address], [place], [place] [address]    Report Status PENDING  Incomplete  Respiratory Panel by RT PCR (Flu A&B, Covid) - Nasopharyngeal Swab     Status: None   Collection Time: 11/07/20 10:30 AM   Specimen: Nasopharyngeal Swab; Nasopharyngeal(NP) swabs in vial transport medium  Result Value Ref Range Status   SARS Coronavirus 2 by RT PCR NEGATIVE NEGATIVE Final    Comment: (NOTE) SARS-CoV-2 target nucleic acids are NOT DETECTED.  The SARS-CoV-2 RNA is generally detectable in upper respiratoy specimens during the acute phase of infection.  The lowest concentration of SARS-CoV-2 viral copies this assay can detect is 131 copies/mL. A negative result does not preclude SARS-Cov-2 infection and should not be used as the sole basis for treatment or other patient management decisions. A negative result may occur with  improper specimen collection/handling, submission of specimen other than nasopharyngeal swab, presence of viral mutation(s) within the areas targeted by this assay, and inadequate number of viral copies (<131 copies/mL). A negative result must be combined with clinical observations, patient history, and epidemiological information. The expected result is Negative.  Fact Sheet for Patients:  PinkCheek.be  Fact Sheet for Healthcare Providers:  GravelBags.it  This test is no t yet approved or cleared by the [place] FDA and  has been authorized for detection and/or  diagnosis of SARS-CoV-2 by FDA under an Emergency Use Authorization (EUA). This EUA will remain  in effect (meaning this test can be used) for the duration of the COVID-19 declaration under Section 564(b)(1) of the Act, 21 U.S.C. section 360bbb-3(b)(1), unless the authorization is terminated or revoked sooner.     Influenza A by PCR NEGATIVE NEGATIVE Final   Influenza B by PCR NEGATIVE NEGATIVE Final    Comment: (NOTE) The Xpert Xpress SARS-CoV-2/FLU/RSV assay is intended as an aid in  the diagnosis of influenza from Nasopharyngeal swab specimens and  should not be used as a sole basis for treatment. Nasal washings and  aspirates are unacceptable for Xpert Xpress SARS-CoV-2/FLU/RSV  testing.  Fact Sheet for Patients: PinkCheek.be  Fact Sheet for Healthcare Providers: GravelBags.it  This test is not yet approved or cleared by the [place] FDA and  has been authorized for detection and/or diagnosis of SARS-CoV-2 by  FDA  under an Emergency Use Authorization (EUA). This EUA will remain  in effect (meaning this test can be used) for the duration of the  Covid-19 declaration under Section 564(b)(1) of the Act, 21  U.S.C. section 360bbb-3(b)(1), unless the authorization is  terminated or revoked. Performed at [facility], [place] [address] [place], [place] [address]   Culture, blood (routine x 2)     Status: None (Preliminary result)   Collection Time: 11/07/20 10:41 AM   Specimen: BLOOD  Result Value Ref Range Status   Specimen Description BLOOD SITE NOT SPECIFIED  Final   Special Requests   Final    BOTTLES DRAWN AEROBIC AND ANAEROBIC Blood Culture results may not be optimal due to an excessive volume of blood received in culture bottles   Culture   Final    NO GROWTH 2 DAYS Performed at [facility], [place] [address], [place], [place] [address]    Report Status PENDING  Incomplete  MRSA PCR Screening     Status: Abnormal   Collection Time: 11/08/20  2:17 PM   Specimen: Nasal Mucosa; Nasopharyngeal  Result Value Ref Range Status   MRSA by PCR POSITIVE (A) NEGATIVE Final    Comment:        The GeneXpert MRSA Assay (FDA approved for NASAL specimens only), is one component of a comprehensive MRSA colonization surveillance program. It is not intended to diagnose MRSA infection nor to guide or monitor treatment for MRSA infections. RESULT CALLED TO, READ BACK BY AND VERIFIED WITH: Alfonse Ras RN 11/08/20 AT 8295 SK Performed at [facility], [place] [address], [place], [place] [address]     Radiology Reports CT Head Wo Contrast  Result Date: 11/07/2020 CLINICAL DATA:  Subdural hematoma. EXAM: CT HEAD WITHOUT CONTRAST TECHNIQUE: Contiguous axial images were obtained from the base of the skull through the vertex without intravenous contrast. COMPARISON:  CT head October 17, 2020. FINDINGS: Brain: Interval decrease in size of the left cerebral convexity low-density extra-axial collection  with 6 mm persistent collection anteriorly (for example series 5, images 26 and 28). No substantial mass effect or midline shift. No evidence of acute large vascular territory infarct. Remote right frontal infarct. Similar generalized cerebral atrophy with patchy white matter hypoattenuation, compatible with chronic microvascular ischemic disease. No hydrocephalus. No mass lesion. Vascular: Calcific atherosclerosis. Skull: No acute fracture. Sinuses/Orbits: Ethmoid air cell mucosal thickening. Otherwise, sinuses are clear. Unremarkable orbits. Other: No mastoid effusions. IMPRESSION: 1. Interval decrease in size of the left cerebral convexity low-density extra-axial collection with  5 mm persistent collection anteriorly. No substantial mass effect or midline shift. 2. 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 Electronically Signed   By: Margaretha Sheffield MD   On: 11/07/2020 10:06   CT Head Wo Contrast  Result Date: 10/17/2020 CLINICAL DATA:  Mental status change.  Recent fall. EXAM: CT HEAD WITHOUT CONTRAST TECHNIQUE: Contiguous axial images were obtained from the base of the skull through the vertex without intravenous contrast. COMPARISON:  10/15/2020. FINDINGS: Brain: Interval development of a low-density, approximately 7 mm thick subdural fluid collection along the left cerebral convexity. Mild associated mass effect with approximately 3 mm of rightward midline shift. Remote right frontal infarct. Similar generalized cerebral atrophy and chronic microvascular ischemic change. No hydrocephalus. No mass lesion. No evidence of acute hemorrhage. No evidence of acute large vascular territory infarct. Vascular: Calcific atherosclerosis. Skull: No acute fracture. Sinuses/Orbits: Mucosal thickening and opacification of anterior right ethmoid air cells. Unremarkable orbits. Other: No mastoid effusions. IMPRESSION: Interval development of a low-density, approximately 7 mm thick subdural  fluid collection along the left cerebral convexity. Mild associated mass effect with 3 mm of rightward midline shift. Electronically Signed   By: Margaretha Sheffield MD   On: 10/17/2020 13:11   CT Head Wo Contrast  Result Date: 10/15/2020 CLINICAL DATA:  Unwitnessed fall EXAM: CT HEAD WITHOUT CONTRAST TECHNIQUE: Contiguous axial images were obtained from the base of the skull through the vertex without intravenous contrast. COMPARISON:  09/01/2017 FINDINGS: Brain: Old right frontal infarct. There is atrophy and chronic small vessel disease changes. No acute intracranial abnormality. Specifically, no hemorrhage, hydrocephalus, mass lesion, acute infarction, or significant intracranial injury. Vascular: No hyperdense vessel or unexpected calcification. Skull: No acute calvarial abnormality. Sinuses/Orbits: No acute findings Other: None IMPRESSION: Old right frontal infarct. Atrophy, chronic microvascular disease. No acute intracranial abnormality. Electronically Signed   By: Rolm Baptise M.D.   On: 10/15/2020 19:05   CT Cervical Spine Wo Contrast  Result Date: 10/15/2020 CLINICAL DATA:  Unwitnessed fall EXAM: CT CERVICAL SPINE WITHOUT CONTRAST TECHNIQUE: Multidetector CT imaging of the cervical spine was performed without intravenous contrast. Multiplanar CT image reconstructions were also generated. COMPARISON:  None. FINDINGS: Alignment: No subluxation. Skull base and vertebrae: No acute fracture. No primary bone lesion or focal pathologic process. Soft tissues and spinal canal: No prevertebral fluid or swelling. No visible canal hematoma. Disc levels: Disc spaces are maintained. Diffuse degenerative facet disease bilaterally. Upper chest: No acute findings Other: None IMPRESSION: No acute bony abnormality. Electronically Signed   By: Rolm Baptise M.D.   On: 10/15/2020 19:07   DG Chest Port 1 View  Result Date: 11/07/2020 CLINICAL DATA:  Altered mental status. EXAM: PORTABLE CHEST 1 VIEW COMPARISON:   October 17, 2020. FINDINGS: Stable cardiomediastinal silhouette. Status post coronary bypass graft. No pneumothorax or pleural effusion is noted. Lungs are clear. Bony thorax is unremarkable. IMPRESSION: No active disease. Electronically Signed   By: Marijo Conception M.D.   On: 11/07/2020 11:01   DG Chest Port 1 View  Result Date: 10/17/2020 CLINICAL DATA:  Shortness of breath. EXAM: PORTABLE CHEST 1 VIEW COMPARISON:  September 01, 2017. FINDINGS: The heart size and mediastinal contours are within normal limits. No pneumothorax or pleural effusion is noted. Status post coronary bypass graft. Both lungs are clear. The visualized skeletal structures are unremarkable. IMPRESSION: No active disease. Electronically Signed   By: Marijo Conception M.D.   On: 10/17/2020 12:45   VAS [place] LOWER EXTREMITY VENOUS (DVT)  Result Date: 11/09/2020  Lower Venous DVT Study Indications: D-dimer.  Limitations: Bandages on RT calf, PT unable to tolerate probe pressure. Comparison Study: No prior studies. Performing Technologist: Darlin Coco, RDMS  Examination Guidelines: A complete evaluation includes B-mode imaging, spectral Doppler, color Doppler, and power Doppler as needed of all accessible portions of each vessel. Bilateral testing is considered an integral part of a complete examination. Limited examinations for reoccurring indications may be performed as noted. The reflux portion of the exam is performed with the patient in reverse Trendelenburg.  +---------+---------------+---------+-----------+----------+--------------+ RIGHT    CompressibilityPhasicitySpontaneityPropertiesThrombus Aging +---------+---------------+---------+-----------+----------+--------------+ CFV                     Yes      Yes                                 +---------+---------------+---------+-----------+----------+--------------+ FV Prox                 Yes      Yes                                  +---------+---------------+---------+-----------+----------+--------------+ FV Mid   Full           Yes      Yes                                 +---------+---------------+---------+-----------+----------+--------------+ FV DistalFull           Yes      Yes                                 +---------+---------------+---------+-----------+----------+--------------+ PFV                     Yes      Yes                                 +---------+---------------+---------+-----------+----------+--------------+ POP                     Yes      Yes                                 +---------+---------------+---------+-----------+----------+--------------+ PTV                                                   Not visualized +---------+---------------+---------+-----------+----------+--------------+ PERO                                                  Not visualized +---------+---------------+---------+-----------+----------+--------------+   +---------+---------------+---------+-----------+----------+--------------+ LEFT     CompressibilityPhasicitySpontaneityPropertiesThrombus Aging +---------+---------------+---------+-----------+----------+--------------+ CFV                     Yes      Yes                                 +---------+---------------+---------+-----------+----------+--------------+  FV Prox                 Yes      Yes                                 +---------+---------------+---------+-----------+----------+--------------+ FV Mid                  Yes      Yes                                 +---------+---------------+---------+-----------+----------+--------------+ FV Distal               Yes      Yes                                 +---------+---------------+---------+-----------+----------+--------------+ PFV                     Yes      Yes                                  +---------+---------------+---------+-----------+----------+--------------+ POP                     Yes      Yes                                 +---------+---------------+---------+-----------+----------+--------------+ PTV                                                   Not visualized +---------+---------------+---------+-----------+----------+--------------+ PERO                                                  Not visualized +---------+---------------+---------+-----------+----------+--------------+     Summary: RIGHT: - There is no evidence of deep vein thrombosis in the lower extremity. However, portions of this examination were limited- see technologist comments above.  - No cystic structure found in the popliteal fossa.  LEFT: - There is no evidence of deep vein thrombosis in the lower extremity. However, portions of this examination were limited- see technologist comments above.  - No cystic structure found in the popliteal fossa.  *See table(s) above for measurements and observations.    Preliminary

## 2020-11-09 NOTE — Progress Notes (Signed)
CSW contacted Durenda Age again. CSW spoke with tech, Mia. She states the nurse is not in yet but she will pass on my contact info.   Abisola Carrero LCSW

## 2020-11-09 NOTE — Progress Notes (Signed)
Lower extremity venous bilateral study completed.   Please see CV Proc for preliminary results.   Deeann Servidio, RDMS  

## 2020-11-09 NOTE — TOC Initial Note (Signed)
Transition of Care [facility]) - Initial/Assessment Note    Patient Details  Name: [name] MRN: [id number] Date of Birth: 1951-07-26  [facility]) CM/SW Contact:    Benard Halsted, LCSW Phone Number: 11/09/2020, 9:31 AM  Clinical Narrative:                 CSW received return call from Bethany, Therapist, sports at [employer] [phone]). She reported that since patient is requiring maximum assistance, they are not able to accept patient back. CSW spoke with patient's brother, [name], about SNF placement. He is saddened by this turn of events and reported feeling overwhelmed if patient requires long term care at SNF without having Medicaid. CSW will send out SNF referral.   Expected Discharge Plan: Skilled Nursing Facility Barriers to Discharge: SNF Pending bed offer, Continued Medical Work up   Patient Goals and CMS Choice Patient states their goals for this hospitalization and ongoing recovery are:: get better CMS Medicare.gov Compare Post Acute Care list provided to:: Patient Represenative (must comment) (Brother) Choice offered to / list presented to : Sibling  Expected Discharge Plan and Services Expected Discharge Plan: [place] In-house Referral: Clinical Social Work   Post Acute Care Choice: [place] Living arrangements for the past 2 months: [place]                                      Prior Living Arrangements/Services Living arrangements for the past 2 months: [place] Lives with:: Facility Resident Patient language and need for interpreter reviewed:: Yes            Current home services: DME    Activities of Daily Living      Permission Sought/Granted Permission sought to share information with : Facility Sport and exercise psychologist, Family Supports Permission granted to share information with : No  Share Information with NAME: [name]  Permission granted to share info w AGENCY:  SNFs  Permission granted to share info w Relationship: Brother  Permission granted to share info w Contact Information: [phone]  Emotional Assessment   Attitude/Demeanor/Rapport: Unable to Assess Affect (typically observed): Unable to Assess Orientation: : Oriented to Self Alcohol / Substance Use: Not Applicable    Admission diagnosis:  Confusion [R41.0] AKI (acute kidney injury) ([place]) [N17.9] Severe sepsis ([place]) [A41.9, R65.20] Sepsis, due to unspecified organism, unspecified whether acute organ dysfunction present [facility]) [A41.9] Patient Active Problem List   Diagnosis Date Noted  . Severe sepsis ([place]) 11/07/2020  . Severe dehydration 11/07/2020  . Cellulitis of left leg 11/07/2020  . Acute delirium   . AMS (altered mental status) 10/17/2020  . Acute metabolic encephalopathy 29/92/4268  . Hypomagnesemia 10/17/2020  . Rhabdomyolysis 09/01/2017  . Hypokalemia 09/01/2017  . Coffee ground emesis 09/01/2017  . SIRS (systemic inflammatory response syndrome) ([place]) 09/01/2017  . Acute lower UTI 09/01/2017  . Acute renal failure (ARF) ([place]) 09/01/2017  . Chronic pain 09/01/2017  . Hematemesis   . 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 COPD mixed type ([place]) 08/09/2017  . 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 Diabetes mellitus type 2, uncontrolled, without complications 34/19/6222  . Amaurosis fugax of right eye 10/26/2015  . Depression 02/28/2015  . Bilateral leg edema, asymmetrical (R>L) 02/20/2015  . SVT (supraventricular tachycardia) ([place]) 12/31/2014  . Diabetes ([place]) 12/30/2014  . Primary osteoarthritis of right hip 12/27/2014  . Angina decubitus (  [place]) 11/08/2014  . Hip joint pain 11/08/2014  . Chronic diastolic heart failure ([place]) 11/08/2014  . Acute on chronic diastolic heart failure ([place]) 09/23/2014  . Protein-calorie malnutrition, severe ([place]) 03/31/2014  . Hypocalcemia 03/30/2014  . Diarrhea 03/30/2014  . Bronchitis, acute 03/30/2014  .  CAD s/p CABG 1994 09/25/2013  . Bilateral carotid artery occlusion 09/25/2013  . 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 H/O gastric bypass 09/25/2013  . Acute encephalopathy 09/25/2013  . History of Diastolic heart failure 55/21/7471  . Iron deficiency anemia 05/28/2012   PCP:  Roetta Sessions, NP Pharmacy:  No Pharmacies Listed    Social Determinants of Health (SDOH) Interventions    Readmission Risk Interventions Readmission Risk Prevention Plan 11/09/2020 10/18/2020  Transportation Screening Complete -  PCP or Specialist Appt within 3-5 Days - Complete  HRI or [place] - Complete  Social Work Consult for [place] Planning/Counseling - Complete  Palliative Care Screening - Complete  Medication Review Press photographer) Referral to Pharmacy Complete  PCP or Specialist appointment within 3-5 days of discharge Complete -  [place] or Home Care Consult Complete -  SW Recovery Care/Counseling Consult Complete -  Palliative Care Screening Not Applicable -  [place] Complete -  Some recent data might be hidden

## 2020-11-10 LAB — COMPREHENSIVE METABOLIC PANEL
ALT: 44 U/L (ref 0–44)
AST: 31 U/L (ref 15–41)
Albumin: 1.3 g/dL — ABNORMAL LOW (ref 3.5–5.0)
Alkaline Phosphatase: 98 U/L (ref 38–126)
Anion gap: 11 (ref 5–15)
BUN: 22 mg/dL (ref 8–23)
CO2: 18 mmol/L — ABNORMAL LOW (ref 22–32)
Calcium: 7.5 mg/dL — ABNORMAL LOW (ref 8.9–10.3)
Chloride: 113 mmol/L — ABNORMAL HIGH (ref 98–111)
Creatinine, Ser: 0.92 mg/dL (ref 0.44–1.00)
GFR, Estimated: >60 mL/min (ref >60)
Glucose, Bld: 119 mg/dL — ABNORMAL HIGH (ref 70–99)
Potassium: 3.9 mmol/L (ref 3.5–5.1)
Sodium: 142 mmol/L (ref 135–145)
Total Bilirubin: 0.7 mg/dL (ref 0.3–1.2)
Total Protein: 3.6 g/dL — ABNORMAL LOW (ref 6.5–8.1)

## 2020-11-10 LAB — CBC WITH DIFFERENTIAL/PLATELET
Abs Immature Granulocytes: 0.03 10*3/uL (ref 0.00–0.07)
Basophils Absolute: 0 10*3/uL (ref 0.0–0.1)
Basophils Relative: 0 %
Eosinophils Absolute: 0.1 10*3/uL (ref 0.0–0.5)
Eosinophils Relative: 1 %
HCT: 29.8 % — ABNORMAL LOW (ref 36.0–46.0)
Hemoglobin: 9.2 g/dL — ABNORMAL LOW (ref 12.0–15.0)
Immature Granulocytes: 0 %
Lymphocytes Relative: 15 %
Lymphs Abs: 1.5 10*3/uL (ref 0.7–4.0)
MCH: 30.2 pg (ref 26.0–34.0)
MCHC: 30.9 g/dL (ref 30.0–36.0)
MCV: 97.7 fL (ref 80.0–100.0)
Monocytes Absolute: 0.6 10*3/uL (ref 0.1–1.0)
Monocytes Relative: 6 %
Neutro Abs: 7.7 10*3/uL (ref 1.7–7.7)
Neutrophils Relative %: 78 %
Platelets: 219 10*3/uL (ref 150–400)
RBC: 3.05 MIL/uL — ABNORMAL LOW (ref 3.87–5.11)
RDW: 14.2 % (ref 11.5–15.5)
WBC: 9.9 10*3/uL (ref 4.0–10.5)
nRBC: 0 % (ref 0.0–0.2)

## 2020-11-10 LAB — MAGNESIUM: Magnesium: 1.6 mg/dL — ABNORMAL LOW (ref 1.7–2.4)

## 2020-11-10 LAB — C-REACTIVE PROTEIN: CRP: 3.5 mg/dL — ABNORMAL HIGH (ref <1.0)

## 2020-11-10 LAB — BRAIN NATRIURETIC PEPTIDE: B Natriuretic Peptide: 582.6 pg/mL — ABNORMAL HIGH (ref 0.0–100.0)

## 2020-11-10 LAB — D-DIMER, QUANTITATIVE: D-Dimer, Quant: 1.27 ug/mL-FEU — ABNORMAL HIGH (ref 0.00–0.50)

## 2020-11-10 LAB — PROCALCITONIN: Procalcitonin: 0.12 ng/mL

## 2020-11-10 MED ORDER — OLANZAPINE 5 MG PO TBDP
2.5000 mg | ORAL_TABLET | Freq: Every day | ORAL | Status: DC
  Administered 2020-11-11 – 2020-11-14 (×2): 2.5 mg via ORAL
  Filled 2020-11-10 (×6): qty 0.5

## 2020-11-10 MED ORDER — OLANZAPINE 5 MG PO TBDP
5.0000 mg | ORAL_TABLET | Freq: Every day | ORAL | Status: DC | PRN
  Administered 2020-11-10: 5 mg via ORAL
  Filled 2020-11-10 (×2): qty 1

## 2020-11-10 NOTE — Progress Notes (Signed)
Pt refusing to eat dinner at this time

## 2020-11-10 NOTE — Progress Notes (Addendum)
PROGRESS NOTE        PATIENT DETAILS Name: [name] Age: 69 y.o. Sex: female Date of Birth: [birth year] Admit Date: 11/07/2020 Admitting Physician Norval Morton, MD TTS:[id number], Rocco Pauls, NP  Brief Narrative: Patient is a 69 y.o. female with history of dementia, chronic diastolic heart failure, AVNRT s/p ablation, COPD, DM-2-who presented to the hospital with acute metabolic encephalopathy, AKI and possible cellulitis of her left lower extremity.  See below for further details.  Significant events: 11/22>> admit to [facility] metabolic encephalopathy/AKI/left lower extremity cellulitis.  Significant studies: 11/22>> CT head: Interval decrease in size of the left cerebral SDH. 11/22>> chest x-ray: No active disease 11/24>> bilateral lower extremity Doppler: No DVT.  Antimicrobial therapy: None  Microbiology data: 11/22>> blood culture: No growth  Procedures : None  Consults: None  DVT Prophylaxis : heparin injection 5,000 Units Start: 11/07/20 2200  Subjective: No major issues overnight-pleasantly confused.  Assessment/Plan: Acute metabolic encephalopathy superimposed on advanced dementia: Acute metabolic encephalopathy likely due to AKI-suspect she is not that far from baseline.    Sepsis due to left lower extremity cellulitis: Sepsis physiology has resolved-soft tissue infection seems to have markedly improved on exam.  Cultures negative so far.  Continue doxycycline for a few more days.  AKI: Likely hemodynamically mediated-resolved with supportive care.  Edema/third spacing due to severe protein calorie malnutrition: Continue supplements.  GERD: Continue PPI  Recent SDH: Interval decrease in size of SDH on most recent CT head-doubt further work-up required.  During her most recent hospitalization-ED MD had discussed with neurosurgeon-Dr. Lora Havens intervention was recommended.  Advanced dementia: Continue  Zyprexa/Cymbalta/Wellbutrin  Failure to thrive syndrome: Very frail-poor oral intake-essentially bedbound-clearly slowly deteriorating.  Assess over the next few days-if no improvement-May need to consider hospice care at some point.  Discussed with brother-Jeff over the phone-continue gentle medical treatment-no heroics-hospice if significant deterioration or if poor oral intake continues.  Diet: Diet Order            Diet heart healthy/carb modified Room service appropriate? Yes with Assist; Fluid consistency: Thin  Diet effective now                  Code Status:  DNR  Family Communication: Abner Greenspan [phone] on 11/10/2020   Disposition Plan: Status is: Inpatient  Remains inpatient appropriate because:Inpatient level of care appropriate due to severity of illness   Dispo: The patient is from: SNF              Anticipated d/c is to: SNF              Anticipated d/c date is: 1 day              Patient currently is not medically stable to d/c.   Barriers to Discharge: failure to thrive synd-cellulitis LLE-awaiting SNF bed  Antimicrobial agents: Anti-infectives (From admission, onward)   Start     Dose/Rate Route Frequency Ordered Stop   11/09/20 1500  doxycycline (VIBRA-TABS) tablet 100 mg        100 mg Oral Every 12 hours 11/09/20 1414     11/09/20 1200  vancomycin (VANCOREADY) IVPB 750 mg/150 mL  Status:  Discontinued        750 mg 150 mL/hr over 60 Minutes Intravenous Every 24 hours 11/09/20 0846 11/09/20 1414   11/08/20 1200  vancomycin (  VANCOREADY) IVPB 500 mg/100 mL  Status:  Discontinued        500 mg 100 mL/hr over 60 Minutes Intravenous Every 24 hours 11/07/20 1033 11/09/20 0846   11/08/20 1000  cefTRIAXone (ROCEPHIN) 2 g in sodium chloride 0.9 % 100 mL IVPB  Status:  Discontinued        2 g 200 mL/hr over 30 Minutes Intravenous Every 24 hours 11/07/20 1850 11/09/20 1414   11/07/20 1045  vancomycin (VANCOREADY) IVPB 1250 mg/250 mL        1,250  mg 166.7 mL/hr over 90 Minutes Intravenous  Once 11/07/20 1033 11/07/20 1406   11/07/20 1030  vancomycin (VANCOCIN) IVPB 1000 mg/200 mL premix  Status:  Discontinued        1,000 mg 200 mL/hr over 60 Minutes Intravenous  Once 11/07/20 1026 11/07/20 1033   11/07/20 1030  cefTRIAXone (ROCEPHIN) 2 g in sodium chloride 0.9 % 100 mL IVPB        2 g 200 mL/hr over 30 Minutes Intravenous  Once 11/07/20 1026 11/07/20 1131       Time spent: 25 minutes-Greater than 50% of this time was spent in counseling, explanation of diagnosis, planning of further management, and coordination of care.  MEDICATIONS: Scheduled Meds: . (feeding supplement) PROSource Plus  30 mL Oral TID PC & HS  . aspirin  81 mg Oral Daily  . buPROPion  150 mg Oral Daily  . Chlorhexidine Gluconate Cloth  6 each Topical Q0600  . doxycycline  100 mg Oral Q12H  . DULoxetine  60 mg Oral Daily  . feeding supplement  237 mL Oral BID BM  . heparin  5,000 Units Subcutaneous Q8H  . mupirocin ointment  1 application Nasal BID  . OLANZapine  2.5 mg Oral QHS  . pantoprazole  40 mg Oral Daily  . sodium chloride flush  3 mL Intravenous Q12H   Continuous Infusions: PRN Meds:.acetaminophen **OR** [DISCONTINUED] acetaminophen, albuterol, [DISCONTINUED] ondansetron **OR** ondansetron (ZOFRAN) IV   PHYSICAL EXAM: Vital signs: Vitals:   11/09/20 1548 11/09/20 2137 11/10/20 0640 11/10/20 1441  BP: 93/66 107/78 (!) 98/36 (!) 94/52  Pulse: 90 80 65 61  Resp: 19 17 18 18   Temp: (!) 97.4 F (36.3 C) 98.2 F (36.8 C) (!) 97.3 F (36.3 C) 97.8 F (36.6 C)  TempSrc: Axillary Axillary Axillary Oral  SpO2: 98% 90% 99% 100%  Weight:       Filed Weights   11/07/20 2127 11/09/20 0517  Weight: 63.7 kg 64 kg   Body mass index is 25.81 kg/m.   Gen Exam:Pleasantly confused-does not follow commands. Not in any distress HEENT:atraumatic, normocephalic Chest: B/L clear to auscultation anteriorly CVS:S1S2 regular Abdomen:soft non tender,  non distended Extremities:no edema Neurology: Seems to move all 4 extremities Skin: no rash  I have personally reviewed following labs and imaging studies  LABORATORY DATA: CBC: Recent Labs  Lab 11/07/20 0907 11/07/20 0919 11/08/20 0050 11/09/20 0100 11/10/20 0226  WBC 19.2*  --  17.4* 10.5 9.9  NEUTROABS 16.9*  --   --  8.2* 7.7  HGB 9.4* 9.9* 9.2* 8.0* 9.2*  HCT 30.0* 29.0* 28.7* 25.4* 29.8*  MCV 97.4  --  96.6 97.3 97.7  PLT 285  --  261 187 500    Basic Metabolic Panel: Recent Labs  Lab 11/07/20 0907 11/07/20 0919 11/08/20 0050 11/09/20 0100 11/10/20 0226  NA 140 142 139 139 142  K 3.3* 3.2* 3.7 3.0* 3.9  CL 109  --  108  108 113*  CO2 17*  --  18* 19* 18*  GLUCOSE 174*  --  79 67* 119*  BUN 37*  --  33* 29* 22  CREATININE 2.09*  --  1.62* 1.13* 0.92  CALCIUM 7.6*  --  7.7* 7.4* 7.5*  MG 1.8  --  1.5* 1.8 1.6*  PHOS 5.8*  --   --   --   --     GFR: Estimated Creatinine Clearance: 50 mL/min (by C-G formula based on SCr of 0.92 mg/dL).  Liver Function Tests: Recent Labs  Lab 11/07/20 0907 11/08/20 0050 11/09/20 0100 11/10/20 0226  AST 45* 53* 46* 31  ALT 45* 43 44 44  ALKPHOS 105 117 96 98  BILITOT 1.3* 1.0 0.9 0.7  PROT 3.9* 3.7* 3.3* 3.6*  ALBUMIN 1.6* 1.5* 1.2* 1.3*   No results for input(s): LIPASE, AMYLASE in the last 168 hours. Recent Labs  Lab 11/07/20 0907  AMMONIA 17    Coagulation Profile: Recent Labs  Lab 11/07/20 0907  INR 1.4*    Cardiac Enzymes: Recent Labs  Lab 11/07/20 1335  CKTOTAL 287*    BNP (last 3 results) No results for input(s): PROBNP in the last 8760 hours.  Lipid Profile: No results for input(s): CHOL, HDL, LDLCALC, TRIG, CHOLHDL, LDLDIRECT in the last 72 hours.  Thyroid Function Tests: No results for input(s): TSH, T4TOTAL, FREET4, T3FREE, THYROIDAB in the last 72 hours.  Anemia Panel: No results for input(s): VITAMINB12, FOLATE, FERRITIN, TIBC, IRON, RETICCTPCT in the last 72 hours.  Urine  analysis:    Component Value Date/Time   COLORURINE AMBER (A) 11/07/2020 0806   APPEARANCEUR HAZY (A) 11/07/2020 0806   APPEARANCEUR Cloudy 01/16/2015 1515   LABSPEC 1.020 11/07/2020 0806   LABSPEC 1.014 01/16/2015 1515   PHURINE 5.0 11/07/2020 0806   GLUCOSEU NEGATIVE 11/07/2020 0806   GLUCOSEU Negative 01/16/2015 1515   HGBUR NEGATIVE 11/07/2020 0806   BILIRUBINUR NEGATIVE 11/07/2020 0806   BILIRUBINUR Negative 01/16/2015 1515   KETONESUR NEGATIVE 11/07/2020 0806   PROTEINUR NEGATIVE 11/07/2020 0806   UROBILINOGEN 0.2 03/30/2014 1415   NITRITE NEGATIVE 11/07/2020 0806   LEUKOCYTESUR NEGATIVE 11/07/2020 0806   LEUKOCYTESUR 2+ 01/16/2015 1515    Sepsis Labs: Lactic Acid, Venous    Component Value Date/Time   LATICACIDVEN 2.2 (HH) 11/07/2020 1713    MICROBIOLOGY: Recent Results (from the past 240 hour(s))  Culture, blood (routine x 2)     Status: None (Preliminary result)   Collection Time: 11/07/20 10:27 AM   Specimen: BLOOD  Result Value Ref Range Status   Specimen Description BLOOD BLOOD RIGHT HAND  Final   Special Requests   Final    BOTTLES DRAWN AEROBIC AND ANAEROBIC Blood Culture adequate volume   Culture   Final    NO GROWTH 3 DAYS Performed at [facility], [place] [address] [place], [place] [address]    Report Status PENDING  Incomplete  Respiratory Panel by RT PCR (Flu A&B, Covid) - Nasopharyngeal Swab     Status: None   Collection Time: 11/07/20 10:30 AM   Specimen: Nasopharyngeal Swab; Nasopharyngeal(NP) swabs in vial transport medium  Result Value Ref Range Status   SARS Coronavirus 2 by RT PCR NEGATIVE NEGATIVE Final    Comment: (NOTE) SARS-CoV-2 target nucleic acids are NOT DETECTED.  The SARS-CoV-2 RNA is generally detectable in upper respiratoy specimens during the acute phase of infection. The lowest concentration of SARS-CoV-2 viral copies this assay can detect is 131 copies/mL. A negative  result does not preclude SARS-Cov-2 infection and  should not be used as the sole basis for treatment or other patient management decisions. A negative result may occur with  improper specimen collection/handling, submission of specimen other than nasopharyngeal swab, presence of viral mutation(s) within the areas targeted by this assay, and inadequate number of viral copies (<131 copies/mL). A negative result must be combined with clinical observations, patient history, and epidemiological information. The expected result is Negative.  Fact Sheet for Patients:  PinkCheek.be  Fact Sheet for Healthcare Providers:  GravelBags.it  This test is no t yet approved or cleared by the [place] FDA and  has been authorized for detection and/or diagnosis of SARS-CoV-2 by FDA under an Emergency Use Authorization (EUA). This EUA will remain  in effect (meaning this test can be used) for the duration of the COVID-19 declaration under Section 564(b)(1) of the Act, 21 U.S.C. section 360bbb-3(b)(1), unless the authorization is terminated or revoked sooner.     Influenza A by PCR NEGATIVE NEGATIVE Final   Influenza B by PCR NEGATIVE NEGATIVE Final    Comment: (NOTE) The Xpert Xpress SARS-CoV-2/FLU/RSV assay is intended as an aid in  the diagnosis of influenza from Nasopharyngeal swab specimens and  should not be used as a sole basis for treatment. Nasal washings and  aspirates are unacceptable for Xpert Xpress SARS-CoV-2/FLU/RSV  testing.  Fact Sheet for Patients: PinkCheek.be  Fact Sheet for Healthcare Providers: GravelBags.it  This test is not yet approved or cleared by the [place] FDA and  has been authorized for detection and/or diagnosis of SARS-CoV-2 by  FDA under an Emergency Use Authorization (EUA). This EUA will remain  in effect (meaning this test can be used) for the duration of the  Covid-19 declaration  under Section 564(b)(1) of the Act, 21  U.S.C. section 360bbb-3(b)(1), unless the authorization is  terminated or revoked. Performed at [facility], [place] [address] [place], [place] [address]   Culture, blood (routine x 2)     Status: None (Preliminary result)   Collection Time: 11/07/20 10:41 AM   Specimen: BLOOD  Result Value Ref Range Status   Specimen Description BLOOD SITE NOT SPECIFIED  Final   Special Requests   Final    BOTTLES DRAWN AEROBIC AND ANAEROBIC Blood Culture results may not be optimal due to an excessive volume of blood received in culture bottles   Culture   Final    NO GROWTH 3 DAYS Performed at [facility], [place] [address], [place], [place] [address]    Report Status PENDING  Incomplete  MRSA PCR Screening     Status: Abnormal   Collection Time: 11/08/20  2:17 PM   Specimen: Nasal Mucosa; Nasopharyngeal  Result Value Ref Range Status   MRSA by PCR POSITIVE (A) NEGATIVE Final    Comment:        The GeneXpert MRSA Assay (FDA approved for NASAL specimens only), is one component of a comprehensive MRSA colonization surveillance program. It is not intended to diagnose MRSA infection nor to guide or monitor treatment for MRSA infections. RESULT CALLED TO, READ BACK BY AND VERIFIED WITH: Alfonse Ras RN 11/08/20 AT 6222 SK Performed at [facility], [place] [address] [place],  [address]     RADIOLOGY STUDIES/RESULTS: VAS [place] LOWER EXTREMITY VENOUS (DVT)  Result Date: 11/09/2020  Lower Venous DVT Study Indications: D-dimer.  Limitations: Bandages on RT calf, PT unable to tolerate probe pressure. Comparison Study: No prior  studies. Performing Technologist: Darlin Coco, RDMS  Examination Guidelines: A complete evaluation includes B-mode imaging, spectral Doppler, color Doppler, and power Doppler as needed of all accessible portions of each vessel. Bilateral testing is considered an integral part of a complete examination. Limited examinations  for reoccurring indications may be performed as noted. The reflux portion of the exam is performed with the patient in reverse Trendelenburg.  +---------+---------------+---------+-----------+----------+--------------+ RIGHT    CompressibilityPhasicitySpontaneityPropertiesThrombus Aging +---------+---------------+---------+-----------+----------+--------------+ CFV                     Yes      Yes                                 +---------+---------------+---------+-----------+----------+--------------+ FV Prox                 Yes      Yes                                 +---------+---------------+---------+-----------+----------+--------------+ FV Mid   Full           Yes      Yes                                 +---------+---------------+---------+-----------+----------+--------------+ FV DistalFull           Yes      Yes                                 +---------+---------------+---------+-----------+----------+--------------+ PFV                     Yes      Yes                                 +---------+---------------+---------+-----------+----------+--------------+ POP                     Yes      Yes                                 +---------+---------------+---------+-----------+----------+--------------+ PTV                                                   Not visualized +---------+---------------+---------+-----------+----------+--------------+ PERO                                                  Not visualized +---------+---------------+---------+-----------+----------+--------------+   +---------+---------------+---------+-----------+----------+--------------+ LEFT     CompressibilityPhasicitySpontaneityPropertiesThrombus Aging +---------+---------------+---------+-----------+----------+--------------+ CFV                     Yes      Yes                                  +---------+---------------+---------+-----------+----------+--------------+  FV Prox                 Yes      Yes                                 +---------+---------------+---------+-----------+----------+--------------+ FV Mid                  Yes      Yes                                 +---------+---------------+---------+-----------+----------+--------------+ FV Distal               Yes      Yes                                 +---------+---------------+---------+-----------+----------+--------------+ PFV                     Yes      Yes                                 +---------+---------------+---------+-----------+----------+--------------+ POP                     Yes      Yes                                 +---------+---------------+---------+-----------+----------+--------------+ PTV                                                   Not visualized +---------+---------------+---------+-----------+----------+--------------+ PERO                                                  Not visualized +---------+---------------+---------+-----------+----------+--------------+     Summary: RIGHT: - There is no evidence of deep vein thrombosis in the lower extremity. However, portions of this examination were limited- see technologist comments above.  - No cystic structure found in the popliteal fossa.  LEFT: - There is no evidence of deep vein thrombosis in the lower extremity. However, portions of this examination were limited- see technologist comments above.  - No cystic structure found in the popliteal fossa.  *See table(s) above for measurements and observations. Electronically signed by Monica Martinez MD on 11/09/2020 at 3:01:13 PM.    Final      LOS: 3 days   Oren Binet, MD  [facility]    To contact the attending provider between 7A-7P or the covering provider during after hours 7P-7A, please log into the web site [email or web address] and access  using universal [place] password for that web site. If you do not have the password, please call the hospital operator.  11/10/2020, 3:18 PM

## 2020-11-10 NOTE — Progress Notes (Signed)
Pt refusing to eat breakfast at this time, RN offered water to pt, pt also refusing to drink water.

## 2020-11-10 NOTE — Progress Notes (Signed)
Pt only ate 15% of lunch this afternoon

## 2020-11-11 NOTE — Progress Notes (Addendum)
Pt refusing to eat breakfast at this time, pt offered water but is pocketing water in her mouth and not swallowing it.

## 2020-11-11 NOTE — Progress Notes (Signed)
[facility] ([facility]) Hospital Liaison note.    Received request from TOC manager for family interest in [facility]. Chart and pt information under review by [facility] physician.  Hospice eligibility pending at this time.  [facility] is unable to offer a room today. Hospital Liaison will follow up tomorrow or sooner if a room becomes available. Please do not hesitate to call with questions.    Thank you for the opportunity to participate in this patient's care.  Chrislyn King, BSN, RN [facility] Hospital Liaison (listed on AMION under Hospice/[facility])    [phone]     

## 2020-11-11 NOTE — TOC Progression Note (Signed)
Transition of Care [facility]) - Progression Note    Patient Details  Name: LOUISIANA SEARLES MRN: [id number] Date of Birth: December 18, 1950  Transition of Care [facility]) CM/SW [place], LCSW Phone Number: 11/11/2020, 2:54 PM  Clinical Narrative:    CSW received consult regarding hospice placement. CSW spoke with patient's brother, [name], to offer choice. He selected [facility]. CSW sent referral to [place], for review.     Expected Discharge Plan: [place] Barriers to Discharge: Hospice Bed not available  Expected Discharge Plan and Services Expected Discharge Plan: [place] In-house Referral: Clinical Social Work   Post Acute Care Choice: [place] Living arrangements for the past 2 months: [place]                                       Social Determinants of Health (SDOH) Interventions    Readmission Risk Interventions Readmission Risk Prevention Plan 11/09/2020 10/18/2020  Transportation Screening Complete -  PCP or Specialist Appt within 3-5 Days - Complete  HRI or [place] - Complete  Social Work Consult for [place] Planning/Counseling - Complete  Palliative Care Screening - Complete  Medication Review [employer]) Referral to Pharmacy Complete  PCP or Specialist appointment within 3-5 days of discharge Complete -  [place] or Home Care Consult Complete -  SW Recovery Care/Counseling Consult Complete -  Palliative Care Screening Not Applicable -  [place] Complete -  Some recent data might be hidden

## 2020-11-11 NOTE — Progress Notes (Signed)
Pt is refusing to take medications at this time Dr. Sloan Leiter aware.

## 2020-11-11 NOTE — Progress Notes (Signed)
Pt refusing to eat dinner at this time

## 2020-11-11 NOTE — Progress Notes (Signed)
PROGRESS NOTE        PATIENT DETAILS Name: [name] Age: 69 y.o. Sex: female Date of Birth: 05-19-1951 Admit Date: 11/07/2020 Admitting Physician Norval Morton, MD [id number], Rocco Pauls, NP  Brief Narrative: Patient is a 69 y.o. female with history of dementia, chronic diastolic heart failure, AVNRT s/p ablation, COPD, DM-2-who presented to the hospital with acute metabolic encephalopathy, AKI and possible cellulitis of her left lower extremity.  See below for further details.  Significant events: 11/22>> admit to [facility] metabolic encephalopathy/AKI/left lower extremity cellulitis.  Significant studies: 11/22>> CT head: Interval decrease in size of the left cerebral SDH. 11/22>> chest x-ray: No active disease 11/24>> bilateral lower extremity Doppler: No DVT.  Antimicrobial therapy: None  Microbiology data: 11/22>> blood culture: No growth  Procedures : None  Consults: None  DVT Prophylaxis : heparin injection 5,000 Units Start: 11/07/20 2200  Subjective: Very pleasantly confusedPer nursing staff-hardly any oral intake for the past several days.  Refusing medications as well.  Assessment/Plan: Acute metabolic encephalopathy superimposed on advanced dementia: Acute metabolic encephalopathy likely due to AKI-suspect she is not that far from baseline.    Sepsis due to left lower extremity cellulitis: Sepsis physiology has resolved-soft tissue infection seems to have markedly improved on exam.  Cultures negative so far.  Continue doxycycline for a few more days.  AKI: Likely hemodynamically mediated-resolved with supportive care.  Edema/third spacing due to severe protein calorie malnutrition: Continue supplements.  GERD: Continue PPI  Recent SDH: Interval decrease in size of SDH on most recent CT head-doubt further work-up required.  During her most recent hospitalization-ED MD had discussed with neurosurgeon-Dr.  Lora Havens intervention was recommended.  Advanced dementia: Continue Zyprexa/Cymbalta/Wellbutrin  Failure to thrive syndrome: Very frail-poor oral intake-essentially bedbound-clearly slowly deteriorating.  Per nursing staff-no significant oral intake for the past few days-refusing medications and food.  Spoke with brother [name] over the phone-explained that she will likely continue to have severe failure to thrive syndrome-probably will require repeated hospitalizations-that will probably prolong her misery and suffering.  Prognosis is obviously very poor.  We discussed about hospice-he is agreeable to explore residential hospice options-suspect that her life expectancy with this poor oral intake is only a few weeks at best.  Patient has very advanced dementia.  Social work consulted.  DNR order in place-goals of care mostly for comfort at this point.  Diet: Diet Order            Diet heart healthy/carb modified Room service appropriate? Yes with Assist; Fluid consistency: Thin  Diet effective now                  Code Status:  DNR  Family Communication: Abner Greenspan [phone] on 11/11/2020   Disposition Plan: Status is: Inpatient  Remains inpatient appropriate because:Inpatient level of care appropriate due to severity of illness   Dispo: The patient is from: SNF              Anticipated d/c is to: SNF              Anticipated d/c date is: 1 day              Patient currently is not medically stable to d/c.   Barriers to Discharge: failure to thrive synd-cellulitis LLE-awaiting SNF bed  Antimicrobial agents: Anti-infectives (From admission, onward)   Start  Dose/Rate Route Frequency Ordered Stop   11/09/20 1500  doxycycline (VIBRA-TABS) tablet 100 mg        100 mg Oral Every 12 hours 11/09/20 1414     11/09/20 1200  vancomycin (VANCOREADY) IVPB 750 mg/150 mL  Status:  Discontinued        750 mg 150 mL/hr over 60 Minutes Intravenous Every 24 hours 11/09/20 0846  11/09/20 1414   11/08/20 1200  vancomycin (VANCOREADY) IVPB 500 mg/100 mL  Status:  Discontinued        500 mg 100 mL/hr over 60 Minutes Intravenous Every 24 hours 11/07/20 1033 11/09/20 0846   11/08/20 1000  cefTRIAXone (ROCEPHIN) 2 g in sodium chloride 0.9 % 100 mL IVPB  Status:  Discontinued        2 g 200 mL/hr over 30 Minutes Intravenous Every 24 hours 11/07/20 1850 11/09/20 1414   11/07/20 1045  vancomycin (VANCOREADY) IVPB 1250 mg/250 mL        1,250 mg 166.7 mL/hr over 90 Minutes Intravenous  Once 11/07/20 1033 11/07/20 1406   11/07/20 1030  vancomycin (VANCOCIN) IVPB 1000 mg/200 mL premix  Status:  Discontinued        1,000 mg 200 mL/hr over 60 Minutes Intravenous  Once 11/07/20 1026 11/07/20 1033   11/07/20 1030  cefTRIAXone (ROCEPHIN) 2 g in sodium chloride 0.9 % 100 mL IVPB        2 g 200 mL/hr over 30 Minutes Intravenous  Once 11/07/20 1026 11/07/20 1131       Time spent: 25 minutes-Greater than 50% of this time was spent in counseling, explanation of diagnosis, planning of further management, and coordination of care.  MEDICATIONS: Scheduled Meds: . (feeding supplement) PROSource Plus  30 mL Oral TID PC & HS  . aspirin  81 mg Oral Daily  . buPROPion  150 mg Oral Daily  . Chlorhexidine Gluconate Cloth  6 each Topical Q0600  . doxycycline  100 mg Oral Q12H  . DULoxetine  60 mg Oral Daily  . feeding supplement  237 mL Oral BID BM  . heparin  5,000 Units Subcutaneous Q8H  . mupirocin ointment  1 application Nasal BID  . OLANZapine zydis  2.5 mg Oral QHS  . pantoprazole  40 mg Oral Daily  . sodium chloride flush  3 mL Intravenous Q12H   Continuous Infusions: PRN Meds:.acetaminophen **OR** [DISCONTINUED] acetaminophen, albuterol, OLANZapine zydis, [DISCONTINUED] ondansetron **OR** ondansetron (ZOFRAN) IV   PHYSICAL EXAM: Vital signs: Vitals:   11/10/20 1441 11/10/20 2013 11/11/20 0638 11/11/20 1149  BP: (!) 94/52 96/71 (!) 115/57 109/75  Pulse: 61 99 75 (!) 105    Resp: 18 18 18 20   Temp: 97.8 F (36.6 C) 97.8 F (36.6 C) 97.7 F (36.5 C) 98 F (36.7 C)  TempSrc: Oral Oral Axillary Axillary  SpO2: 100% 97% 100% 99%  Weight:       Filed Weights   11/07/20 2127 11/09/20 0517  Weight: 63.7 kg 64 kg   Body mass index is 25.81 kg/m.   Gen Exam: Very pleasantly confused but not in any distress. HEENT:atraumatic, normocephalic Chest: B/L clear to auscultation anteriorly CVS:S1S2 regular Abdomen:soft non tender, non distended Extremities:no edema Neurology: Non focal Skin: no rash  I have personally reviewed following labs and imaging studies  LABORATORY DATA: CBC: Recent Labs  Lab 11/07/20 0907 11/07/20 0919 11/08/20 0050 11/09/20 0100 11/10/20 0226  WBC 19.2*  --  17.4* 10.5 9.9  NEUTROABS 16.9*  --   --  8.2*  7.7  HGB 9.4* 9.9* 9.2* 8.0* 9.2*  HCT 30.0* 29.0* 28.7* 25.4* 29.8*  MCV 97.4  --  96.6 97.3 97.7  PLT 285  --  261 187 527    Basic Metabolic Panel: Recent Labs  Lab 11/07/20 0907 11/07/20 0919 11/08/20 0050 11/09/20 0100 11/10/20 0226  NA 140 142 139 139 142  K 3.3* 3.2* 3.7 3.0* 3.9  CL 109  --  108 108 113*  CO2 17*  --  18* 19* 18*  GLUCOSE 174*  --  79 67* 119*  BUN 37*  --  33* 29* 22  CREATININE 2.09*  --  1.62* 1.13* 0.92  CALCIUM 7.6*  --  7.7* 7.4* 7.5*  MG 1.8  --  1.5* 1.8 1.6*  PHOS 5.8*  --   --   --   --     GFR: Estimated Creatinine Clearance: 50 mL/min (by C-G formula based on SCr of 0.92 mg/dL).  Liver Function Tests: Recent Labs  Lab 11/07/20 0907 11/08/20 0050 11/09/20 0100 11/10/20 0226  AST 45* 53* 46* 31  ALT 45* 43 44 44  ALKPHOS 105 117 96 98  BILITOT 1.3* 1.0 0.9 0.7  PROT 3.9* 3.7* 3.3* 3.6*  ALBUMIN 1.6* 1.5* 1.2* 1.3*   No results for input(s): LIPASE, AMYLASE in the last 168 hours. Recent Labs  Lab 11/07/20 0907  AMMONIA 17    Coagulation Profile: Recent Labs  Lab 11/07/20 0907  INR 1.4*    Cardiac Enzymes: Recent Labs  Lab 11/07/20 1335   CKTOTAL 287*    BNP (last 3 results) No results for input(s): PROBNP in the last 8760 hours.  Lipid Profile: No results for input(s): CHOL, HDL, LDLCALC, TRIG, CHOLHDL, LDLDIRECT in the last 72 hours.  Thyroid Function Tests: No results for input(s): TSH, T4TOTAL, FREET4, T3FREE, THYROIDAB in the last 72 hours.  Anemia Panel: No results for input(s): VITAMINB12, FOLATE, FERRITIN, TIBC, IRON, RETICCTPCT in the last 72 hours.  Urine analysis:    Component Value Date/Time   COLORURINE AMBER (A) 11/07/2020 0806   APPEARANCEUR HAZY (A) 11/07/2020 0806   APPEARANCEUR Cloudy 01/16/2015 1515   LABSPEC 1.020 11/07/2020 0806   LABSPEC 1.014 01/16/2015 1515   PHURINE 5.0 11/07/2020 0806   GLUCOSEU NEGATIVE 11/07/2020 0806   GLUCOSEU Negative 01/16/2015 1515   HGBUR NEGATIVE 11/07/2020 0806   BILIRUBINUR NEGATIVE 11/07/2020 0806   BILIRUBINUR Negative 01/16/2015 1515   KETONESUR NEGATIVE 11/07/2020 0806   PROTEINUR NEGATIVE 11/07/2020 0806   UROBILINOGEN 0.2 03/30/2014 1415   NITRITE NEGATIVE 11/07/2020 0806   LEUKOCYTESUR NEGATIVE 11/07/2020 0806   LEUKOCYTESUR 2+ 01/16/2015 1515    Sepsis Labs: Lactic Acid, Venous    Component Value Date/Time   LATICACIDVEN 2.2 (HH) 11/07/2020 1713    MICROBIOLOGY: Recent Results (from the past 240 hour(s))  Culture, blood (routine x 2)     Status: None (Preliminary result)   Collection Time: 11/07/20 10:27 AM   Specimen: BLOOD  Result Value Ref Range Status   Specimen Description BLOOD BLOOD RIGHT HAND  Final   Special Requests   Final    BOTTLES DRAWN AEROBIC AND ANAEROBIC Blood Culture adequate volume   Culture   Final    NO GROWTH 3 DAYS Performed at [facility], [place] [address] [place], [place] [address]    Report Status PENDING  Incomplete  Respiratory Panel by RT PCR (Flu A&B, Covid) - Nasopharyngeal Swab     Status: None   Collection Time: 11/07/20 10:30  AM   Specimen: Nasopharyngeal Swab; Nasopharyngeal(NP) swabs in  vial transport medium  Result Value Ref Range Status   SARS Coronavirus 2 by RT PCR NEGATIVE NEGATIVE Final    Comment: (NOTE) SARS-CoV-2 target nucleic acids are NOT DETECTED.  The SARS-CoV-2 RNA is generally detectable in upper respiratoy specimens during the acute phase of infection. The lowest concentration of SARS-CoV-2 viral copies this assay can detect is 131 copies/mL. A negative result does not preclude SARS-Cov-2 infection and should not be used as the sole basis for treatment or other patient management decisions. A negative result may occur with  improper specimen collection/handling, submission of specimen other than nasopharyngeal swab, presence of viral mutation(s) within the areas targeted by this assay, and inadequate number of viral copies (<131 copies/mL). A negative result must be combined with clinical observations, patient history, and epidemiological information. The expected result is Negative.  Fact Sheet for Patients:  PinkCheek.be  Fact Sheet for Healthcare Providers:  GravelBags.it  This test is no t yet approved or cleared by the [place] FDA and  has been authorized for detection and/or diagnosis of SARS-CoV-2 by FDA under an Emergency Use Authorization (EUA). This EUA will remain  in effect (meaning this test can be used) for the duration of the COVID-19 declaration under Section 564(b)(1) of the Act, 21 U.S.C. section 360bbb-3(b)(1), unless the authorization is terminated or revoked sooner.     Influenza A by PCR NEGATIVE NEGATIVE Final   Influenza B by PCR NEGATIVE NEGATIVE Final    Comment: (NOTE) The Xpert Xpress SARS-CoV-2/FLU/RSV assay is intended as an aid in  the diagnosis of influenza from Nasopharyngeal swab specimens and  should not be used as a sole basis for treatment. Nasal washings and  aspirates are unacceptable for Xpert Xpress SARS-CoV-2/FLU/RSV  testing.  Fact  Sheet for Patients: PinkCheek.be  Fact Sheet for Healthcare Providers: GravelBags.it  This test is not yet approved or cleared by the [place] FDA and  has been authorized for detection and/or diagnosis of SARS-CoV-2 by  FDA under an Emergency Use Authorization (EUA). This EUA will remain  in effect (meaning this test can be used) for the duration of the  Covid-19 declaration under Section 564(b)(1) of the Act, 21  U.S.C. section 360bbb-3(b)(1), unless the authorization is  terminated or revoked. Performed at [facility], [place] [address], [place], [place] [address]   Culture, blood (routine x 2)     Status: None (Preliminary result)   Collection Time: 11/07/20 10:41 AM   Specimen: BLOOD  Result Value Ref Range Status   Specimen Description BLOOD SITE NOT SPECIFIED  Final   Special Requests   Final    BOTTLES DRAWN AEROBIC AND ANAEROBIC Blood Culture results may not be optimal due to an excessive volume of blood received in culture bottles   Culture   Final    NO GROWTH 3 DAYS Performed at [facility], [place] [address] [place], [place] [address]    Report Status PENDING  Incomplete  MRSA PCR Screening     Status: Abnormal   Collection Time: 11/08/20  2:17 PM   Specimen: Nasal Mucosa; Nasopharyngeal  Result Value Ref Range Status   MRSA by PCR POSITIVE (A) NEGATIVE Final    Comment:        The GeneXpert MRSA Assay (FDA approved for NASAL specimens only), is one component of a comprehensive MRSA colonization surveillance program. It is not intended to diagnose MRSA infection nor to guide  or monitor treatment for MRSA infections. RESULT CALLED TO, READ BACK BY AND VERIFIED WITH: Alfonse Ras RN 11/08/20 AT 6237 SK Performed at [facility], [place] [address] [place],  [address]     RADIOLOGY STUDIES/RESULTS: No results found.   LOS: 4 days   Oren Binet, MD  [facility]    To contact the attending provider between 7A-7P or the covering provider during after hours 7P-7A, please log into the web site [email or web address] and access using universal [place] password for that web site. If you do not have the password, please call the hospital operator.  11/11/2020, 12:44 PM

## 2020-11-11 NOTE — Progress Notes (Signed)
Pt refusing to eat lunch at this time

## 2020-11-12 LAB — CULTURE, BLOOD (ROUTINE X 2)
Culture: NO GROWTH
Culture: NO GROWTH
Special Requests: ADEQUATE

## 2020-11-12 MED ORDER — MORPHINE SULFATE (CONCENTRATE) 10 MG/0.5ML PO SOLN: 5.0000 mg | ORAL | Status: AC | PRN

## 2020-11-12 MED ORDER — OLANZAPINE 5 MG PO TBDP: 5.0000 mg | ORAL_TABLET | Freq: Every day | ORAL | Status: AC | PRN

## 2020-11-12 MED ORDER — MORPHINE SULFATE (CONCENTRATE) 10 MG/0.5ML PO SOLN
5.0000 mg | ORAL | Status: DC | PRN
  Administered 2020-11-12 – 2020-11-15 (×5): 5 mg via ORAL
  Filled 2020-11-12 (×5): qty 0.5

## 2020-11-12 NOTE — Progress Notes (Signed)
Manufacturing engineer [facility]) Hospital Liaison note.     Received request from [place] for family interest in [facility]. [facility] is unable to offer a room today. Hospital Liaison will follow up tomorrow or sooner if a room becomes available and eligibility is confirmed.   A Please do not hesitate to call with questions.     Thank you,    Farrel Gordon, RN, [facility]       [place] (listed on AMION under [place])     [phone]

## 2020-11-12 NOTE — Discharge Summary (Addendum)
PATIENT DETAILS Name: [name] Age: 69 y.o. Sex: female Date of Birth: 1951/11/22 MRN: [id number]. Admitting Physician: Norval Morton, MD [id number], Rocco Pauls, NP  Admit Date: 11/07/2020 Discharge date: 11/15/2020  Recommendations for Outpatient Follow-up:  Optimize comfort care at the residential hospice.  Admitted From:  ALF   Disposition: [place]: No  Equipment/Devices: None  Discharge Condition: Stable  CODE STATUS:  DNR  Diet recommendation:  Diet Order            Diet - low sodium heart healthy           Diet heart healthy/carb modified Room service appropriate? Yes with Assist; Fluid consistency: Thin  Diet effective now                 Brief Narrative: Patient is a 69 y.o. female with history of dementia, chronic diastolic heart failure, AVNRT s/p ablation, COPD, DM-2-who presented to the hospital with acute metabolic encephalopathy, AKI and possible cellulitis of her left lower extremity.  See below for further details  Significant events: 11/22>> admit to [facility] metabolic encephalopathy/AKI/left lower extremity cellulitis.  Significant studies: 11/22>> CT head: Interval decrease in size of the left cerebral SDH. 11/22>> chest x-ray: No active disease 11/24>> bilateral lower extremity Doppler: No DVT.  Microbiology data: 11/22>> blood culture: No growth   Brief Hospital Course: Acute metabolic encephalopathy superimposed on advanced dementia: Acute metabolic encephalopathy likely due to AKI-suspect she is not that far from baseline.  Continues to be very pleasantly confused-and refusing to eat and refusing some of her medications.  Sepsis due to left lower extremity cellulitis: Sepsis physiology has resolved-soft tissue infection seems to have markedly improved on exam.  Cultures negative so far.    Was treated with antimicrobial therapy-doxycycline will be discontinued on 11/27.  Does not  require any further antimicrobial therapy on discharge.  AKI: Likely hemodynamically mediated-resolved with supportive care.  Edema/third spacing due to severe protein calorie malnutrition:  Continue supportive care  GERD: No longer on PPI  Recent SDH: Interval decrease in size of SDH on most recent CT head-doubt further work-up required.  During her most recent hospitalization-ED MD had discussed with neurosurgeon-Dr. Lora Havens intervention was recommended.  Advanced dementia: Continue Zyprexa/Cymbalta/Wellbutrin for comfort.  Failure to thrive syndrome: Very frail-poor oral intake-essentially bedbound-clearly slowly deteriorating.  Per nursing staff-no significant oral intake for the past few days-refusing medications and food.  Spoke with brother [name] over the phone-explained that she will likely continue to have severe failure to thrive syndrome-probably will require repeated hospitalizations-that will probably prolong her misery and suffering.  Prognosis is obviously very poor.  We discussed about hospice-he is agreeable to explore residential hospice options-suspect that her life expectancy with this poor oral intake is only a few weeks at best.  Patient has very advanced dementia.  Social work consulted-stable for discharge to residential hospice when bed is available.  DNR in place.    Discharge Diagnoses:  Principal Problem:   Sepsis Active Problems:   Hrotein-calorie malnutrition, severe ([place])   Diabetes ([place])   Hypokalemia   Acute renal failure (ARF) (HCC)   Acute metabolic encephalopathy   Severe dehydration   Cellulitis of left leg   Discharge Instructions:  Activity:  As tolerated with Full fall precautions use walker/cane & assistance as needed  Discharge Instructions    Diet - low sodium heart healthy   Complete by: As directed    Increase activity slowly  Complete by: As directed    No wound care   Complete by: As directed      Allergies as of  11/15/2020      Reactions   Penicillins Rash   ++ tolerates cefepime++ Has patient had a PCN reaction causing immediate rash, facial/tongue/throat swelling, SOB or lightheadedness with hypotension: Unknown Has patient had a PCN reaction causing severe rash involving mucus membranes or skin necrosis: Unknown Has patient had a PCN reaction that required hospitalization: No Has patient had a PCN reaction occurring within the last 10 years: No If all of the above answers are "NO", then may proceed with Cephalosporin use.   Latex    Metoclopramide Hcl    Neurontin [gabapentin]       Medication List    STOP taking these medications   acetaminophen 500 MG tablet Commonly known as: TYLENOL   aspirin 81 MG tablet   BAZA PROTECT EX   blood glucose meter kit and supplies Kit   CALCIUM CITRATE MAXIMUM/VIT D PO   furosemide 20 MG tablet Commonly known as: LASIX   glucose blood test strip   Iron (Ferrous Sulfate) 325 (65 Fe) MG Tabs   multivitamin with minerals tablet   nitroGLYCERIN 0.4 MG/SPRAY spray Commonly known as: NITROLINGUAL   omeprazole 40 MG capsule Commonly known as: PRILOSEC   sulfamethoxazole-trimethoprim 800-160 MG tablet Commonly known as: BACTRIM DS   vitamin E 180 MG (400 UNITS) capsule Generic drug: vitamin E     TAKE these medications   buPROPion 150 MG 24 hr tablet Commonly known as: WELLBUTRIN XL Take 150 mg by mouth daily.   DULoxetine 60 MG capsule Commonly known as: CYMBALTA Take 60 mg by mouth daily.   morphine CONCENTRATE 10 MG/0.5ML Soln concentrated solution Take 0.25 mLs (5 mg total) by mouth every 2 (two) hours as needed for severe pain, anxiety or shortness of breath.   OLANZapine 2.5 MG tablet Commonly known as: ZYPREXA Take 1 tablet (2.5 mg total) by mouth at bedtime. What changed: Another medication with the same name was added. Make sure you understand how and when to take each.   OLANZapine zydis 5 MG disintegrating  tablet Commonly known as: ZYPREXA Take 1 tablet (5 mg total) by mouth daily as needed (agitation, sedation). What changed: You were already taking a medication with the same name, and this prescription was added. Make sure you understand how and when to take each.       Follow-up Information    Roetta Sessions, NP Follow up.   Specialty: Nurse Practitioner Why: as needed Contact information: [address] [place] [address] [phone]              Allergies  Allergen Reactions  . Penicillins Rash    ++ tolerates cefepime++ Has patient had a PCN reaction causing immediate rash, facial/tongue/throat swelling, SOB or lightheadedness with hypotension: Unknown Has patient had a PCN reaction causing severe rash involving mucus membranes or skin necrosis: Unknown Has patient had a PCN reaction that required hospitalization: No Has patient had a PCN reaction occurring within the last 10 years: No If all of the above answers are "NO", then may proceed with Cephalosporin use.   . Latex   . Metoclopramide Hcl   . Neurontin [Gabapentin]     Other Procedures/Studies: CT Head Wo Contrast  Result Date: 11/07/2020 CLINICAL DATA:  Subdural hematoma. EXAM: CT HEAD WITHOUT CONTRAST TECHNIQUE: Contiguous axial images were obtained from the base of  the skull through the vertex without intravenous contrast. COMPARISON:  CT head October 17, 2020. FINDINGS: Brain: Interval decrease in size of the left cerebral convexity low-density extra-axial collection with 6 mm persistent collection anteriorly (for example series 5, images 26 and 28). No substantial mass effect or midline shift. No evidence of acute large vascular territory infarct. Remote right frontal infarct. Similar generalized cerebral atrophy with patchy white matter hypoattenuation, compatible with chronic microvascular ischemic disease. No hydrocephalus. No mass lesion. Vascular: Calcific atherosclerosis. Skull:  No acute fracture. Sinuses/Orbits: Ethmoid air cell mucosal thickening. Otherwise, sinuses are clear. Unremarkable orbits. Other: No mastoid effusions. IMPRESSION: 1. Interval decrease in size of the left cerebral convexity low-density extra-axial collection with 5 mm persistent collection anteriorly. No substantial mass effect or midline shift. 2. 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 Electronically Signed   By: Margaretha Sheffield MD   On: 11/07/2020 10:06   CT Head Wo Contrast  Result Date: 10/17/2020 CLINICAL DATA:  Mental status change.  Recent fall. EXAM: CT HEAD WITHOUT CONTRAST TECHNIQUE: Contiguous axial images were obtained from the base of the skull through the vertex without intravenous contrast. COMPARISON:  10/15/2020. FINDINGS: Brain: Interval development of a low-density, approximately 7 mm thick subdural fluid collection along the left cerebral convexity. Mild associated mass effect with approximately 3 mm of rightward midline shift. Remote right frontal infarct. Similar generalized cerebral atrophy and chronic microvascular ischemic change. No hydrocephalus. No mass lesion. No evidence of acute hemorrhage. No evidence of acute large vascular territory infarct. Vascular: Calcific atherosclerosis. Skull: No acute fracture. Sinuses/Orbits: Mucosal thickening and opacification of anterior right ethmoid air cells. Unremarkable orbits. Other: No mastoid effusions. IMPRESSION: Interval development of a low-density, approximately 7 mm thick subdural fluid collection along the left cerebral convexity. Mild associated mass effect with 3 mm of rightward midline shift. Electronically Signed   By: Margaretha Sheffield MD   On: 10/17/2020 13:11   DG Chest Port 1 View  Result Date: 11/07/2020 CLINICAL DATA:  Altered mental status. EXAM: PORTABLE CHEST 1 VIEW COMPARISON:  October 17, 2020. FINDINGS: Stable cardiomediastinal silhouette. Status post coronary bypass graft. No  pneumothorax or pleural effusion is noted. Lungs are clear. Bony thorax is unremarkable. IMPRESSION: No active disease. Electronically Signed   By: Marijo Conception M.D.   On: 11/07/2020 11:01   DG Chest Port 1 View  Result Date: 10/17/2020 CLINICAL DATA:  Shortness of breath. EXAM: PORTABLE CHEST 1 VIEW COMPARISON:  September 01, 2017. FINDINGS: The heart size and mediastinal contours are within normal limits. No pneumothorax or pleural effusion is noted. Status post coronary bypass graft. Both lungs are clear. The visualized skeletal structures are unremarkable. IMPRESSION: No active disease. Electronically Signed   By: Marijo Conception M.D.   On: 10/17/2020 12:45   VAS [place] LOWER EXTREMITY VENOUS (DVT)  Result Date: 11/09/2020  Lower Venous DVT Study Indications: D-dimer.  Limitations: Bandages on RT calf, PT unable to tolerate probe pressure. Comparison Study: No prior studies. Performing Technologist: Darlin Coco, RDMS  Examination Guidelines: A complete evaluation includes B-mode imaging, spectral Doppler, color Doppler, and power Doppler as needed of all accessible portions of each vessel. Bilateral testing is considered an integral part of a complete examination. Limited examinations for reoccurring indications may be performed as noted. The reflux portion of the exam is performed with the patient in reverse Trendelenburg.  +---------+---------------+---------+-----------+----------+--------------+ RIGHT    CompressibilityPhasicitySpontaneityPropertiesThrombus Aging +---------+---------------+---------+-----------+----------+--------------+ CFV  Yes      Yes                                 +---------+---------------+---------+-----------+----------+--------------+ FV Prox                 Yes      Yes                                 +---------+---------------+---------+-----------+----------+--------------+ FV Mid   Full           Yes      Yes                                  +---------+---------------+---------+-----------+----------+--------------+ FV DistalFull           Yes      Yes                                 +---------+---------------+---------+-----------+----------+--------------+ PFV                     Yes      Yes                                 +---------+---------------+---------+-----------+----------+--------------+ POP                     Yes      Yes                                 +---------+---------------+---------+-----------+----------+--------------+ PTV                                                   Not visualized +---------+---------------+---------+-----------+----------+--------------+ PERO                                                  Not visualized +---------+---------------+---------+-----------+----------+--------------+   +---------+---------------+---------+-----------+----------+--------------+ LEFT     CompressibilityPhasicitySpontaneityPropertiesThrombus Aging +---------+---------------+---------+-----------+----------+--------------+ CFV                     Yes      Yes                                 +---------+---------------+---------+-----------+----------+--------------+ FV Prox                 Yes      Yes                                 +---------+---------------+---------+-----------+----------+--------------+ FV Mid                  Yes      Yes                                 +---------+---------------+---------+-----------+----------+--------------+  FV Distal               Yes      Yes                                 +---------+---------------+---------+-----------+----------+--------------+ PFV                     Yes      Yes                                 +---------+---------------+---------+-----------+----------+--------------+ POP                     Yes      Yes                                  +---------+---------------+---------+-----------+----------+--------------+ PTV                                                   Not visualized +---------+---------------+---------+-----------+----------+--------------+ PERO                                                  Not visualized +---------+---------------+---------+-----------+----------+--------------+     Summary: RIGHT: - There is no evidence of deep vein thrombosis in the lower extremity. However, portions of this examination were limited- see technologist comments above.  - No cystic structure found in the popliteal fossa.  LEFT: - There is no evidence of deep vein thrombosis in the lower extremity. However, portions of this examination were limited- see technologist comments above.  - No cystic structure found in the popliteal fossa.  *See table(s) above for measurements and observations. Electronically signed by Monica Martinez MD on 11/09/2020 at 3:01:13 PM.    Final      TODAY-DAY OF DISCHARGE:  Subjective:   Donna Bernard today remains very confused-per nursing staff-hardly any oral intake at all.  Objective:   Blood pressure 112/86, pulse 95, temperature 98 F (36.7 C), temperature source Oral, resp. rate 16, height $RemoveBe'5\' 2"'LklRsMIjY$  (1.575 m), weight 64 kg, SpO2 94 %.  Intake/Output Summary (Last 24 hours) at 11/15/2020 1029 Last data filed at 11/15/2020 0609 Gross per 24 hour  Intake --  Output 200 ml  Net -200 ml   Filed Weights   11/07/20 2127 11/09/20 0517 11/14/20 0700  Weight: 63.7 kg 64 kg 64 kg    Exam: Confused but not in any distress [place].AT,PERRAL Supple Neck,No JVD, No cervical lymphadenopathy appriciated.  Symmetrical Chest wall movement, Good air movement bilaterally, CTAB RRR,No Gallops,Rubs or new Murmurs, No Parasternal Heave +ve B.Sounds, Abd Soft, Non tender, No organomegaly appriciated, No rebound -guarding or rigidity. No Cyanosis, Clubbing or edema, No new Rash or bruise   PERTINENT  RADIOLOGIC STUDIES: No results found.   PERTINENT LAB RESULTS: CBC: No results for input(s): WBC, HGB, HCT, PLT in the last 72 hours. CMET CMP     Component Value Date/Time   NA 142 11/10/2020 0226   NA 137 09/30/2013 1404  K 3.9 11/10/2020 0226   K 4.1 09/30/2013 1404   CL 113 (H) 11/10/2020 0226   CL 107 03/23/2013 1401   CO2 18 (L) 11/10/2020 0226   CO2 23 09/30/2013 1404   GLUCOSE 119 (H) 11/10/2020 0226   GLUCOSE 130 09/30/2013 1404   GLUCOSE 163 (H) 03/23/2013 1401   BUN 22 11/10/2020 0226   BUN 9.1 09/30/2013 1404   CREATININE 0.92 11/10/2020 0226   CREATININE 0.58 06/06/2016 1634   CREATININE 0.7 09/30/2013 1404   CALCIUM 7.5 (L) 11/10/2020 0226   CALCIUM 6.5 (L) 09/30/2013 1404   PROT 3.6 (L) 11/10/2020 0226   PROT 6.4 09/30/2013 1404   ALBUMIN 1.3 (L) 11/10/2020 0226   ALBUMIN 3.3 (L) 09/30/2013 1404   AST 31 11/10/2020 0226   AST 35 (H) 09/30/2013 1404   ALT 44 11/10/2020 0226   ALT 33 09/30/2013 1404   ALKPHOS 98 11/10/2020 0226   ALKPHOS 354 (H) 09/30/2013 1404   BILITOT 0.7 11/10/2020 0226   BILITOT 0.51 09/30/2013 1404   GFRNONAA >60 11/10/2020 0226   GFRNONAA >89 06/06/2016 1634   GFRAA >60 09/04/2017 0418   GFRAA >89 06/06/2016 1634    GFR Estimated Creatinine Clearance: 50 mL/min (by C-G formula based on SCr of 0.92 mg/dL). No results for input(s): LIPASE, AMYLASE in the last 72 hours. No results for input(s): CKTOTAL, CKMB, CKMBINDEX, TROPONINI in the last 72 hours. Invalid input(s): POCBNP No results for input(s): DDIMER in the last 72 hours. No results for input(s): HGBA1C in the last 72 hours. No results for input(s): CHOL, HDL, LDLCALC, TRIG, CHOLHDL, LDLDIRECT in the last 72 hours. No results for input(s): TSH, T4TOTAL, T3FREE, THYROIDAB in the last 72 hours.  Invalid input(s): FREET3 No results for input(s): VITAMINB12, FOLATE, FERRITIN, TIBC, IRON, RETICCTPCT in the last 72 hours. Coags: No results for input(s): INR in the last 72  hours.  Invalid input(s): PT Microbiology: Recent Results (from the past 240 hour(s))  Culture, blood (routine x 2)     Status: None   Collection Time: 11/07/20 10:27 AM   Specimen: BLOOD  Result Value Ref Range Status   Specimen Description BLOOD BLOOD RIGHT HAND  Final   Special Requests   Final    BOTTLES DRAWN AEROBIC AND ANAEROBIC Blood Culture adequate volume   Culture   Final    NO GROWTH 5 DAYS Performed at [facility], [address] [place], [place] [address]    Report Status 11/12/2020 FINAL  Final  Respiratory Panel by RT PCR (Flu A&B, Covid) - Nasopharyngeal Swab     Status: None   Collection Time: 11/07/20 10:30 AM   Specimen: Nasopharyngeal Swab; Nasopharyngeal(NP) swabs in vial transport medium  Result Value Ref Range Status   SARS Coronavirus 2 by RT PCR NEGATIVE NEGATIVE Final    Comment: (NOTE) SARS-CoV-2 target nucleic acids are NOT DETECTED.  The SARS-CoV-2 RNA is generally detectable in upper respiratoy specimens during the acute phase of infection. The lowest concentration of SARS-CoV-2 viral copies this assay can detect is 131 copies/mL. A negative result does not preclude SARS-Cov-2 infection and should not be used as the sole basis for treatment or other patient management decisions. A negative result may occur with  improper specimen collection/handling, submission of specimen other than nasopharyngeal swab, presence of viral mutation(s) within the areas targeted by this assay, and inadequate number of viral copies (<131 copies/mL). A negative result must be combined with clinical observations, patient history, and epidemiological information.  The expected result is Negative.  Fact Sheet for Patients:  PinkCheek.be  Fact Sheet for Healthcare Providers:  GravelBags.it  This test is no t yet approved or cleared by the [place] FDA and  has been authorized for detection and/or  diagnosis of SARS-CoV-2 by FDA under an Emergency Use Authorization (EUA). This EUA will remain  in effect (meaning this test can be used) for the duration of the COVID-19 declaration under Section 564(b)(1) of the Act, 21 U.S.C. section 360bbb-3(b)(1), unless the authorization is terminated or revoked sooner.     Influenza A by PCR NEGATIVE NEGATIVE Final   Influenza B by PCR NEGATIVE NEGATIVE Final    Comment: (NOTE) The Xpert Xpress SARS-CoV-2/FLU/RSV assay is intended as an aid in  the diagnosis of influenza from Nasopharyngeal swab specimens and  should not be used as a sole basis for treatment. Nasal washings and  aspirates are unacceptable for Xpert Xpress SARS-CoV-2/FLU/RSV  testing.  Fact Sheet for Patients: PinkCheek.be  Fact Sheet for Healthcare Providers: GravelBags.it  This test is not yet approved or cleared by the [place] FDA and  has been authorized for detection and/or diagnosis of SARS-CoV-2 by  FDA under an Emergency Use Authorization (EUA). This EUA will remain  in effect (meaning this test can be used) for the duration of the  Covid-19 declaration under Section 564(b)(1) of the Act, 21  U.S.C. section 360bbb-3(b)(1), unless the authorization is  terminated or revoked. Performed at [facility], [place] [address] [place], [place] [address]   Culture, blood (routine x 2)     Status: None   Collection Time: 11/07/20 10:41 AM   Specimen: BLOOD  Result Value Ref Range Status   Specimen Description BLOOD SITE NOT SPECIFIED  Final   Special Requests   Final    BOTTLES DRAWN AEROBIC AND ANAEROBIC Blood Culture results may not be optimal due to an excessive volume of blood received in culture bottles   Culture   Final    NO GROWTH 5 DAYS Performed at [facility], [place] [address], [place], [place] [address]    Report Status 11/12/2020 FINAL  Final  MRSA PCR Screening     Status: Abnormal     Collection Time: 11/08/20  2:17 PM   Specimen: Nasal Mucosa; Nasopharyngeal  Result Value Ref Range Status   MRSA by PCR POSITIVE (A) NEGATIVE Final    Comment:        The GeneXpert MRSA Assay (FDA approved for NASAL specimens only), is one component of a comprehensive MRSA colonization surveillance program. It is not intended to diagnose MRSA infection nor to guide or monitor treatment for MRSA infections. RESULT CALLED TO, READ BACK BY AND VERIFIED WITH: Alfonse Ras RN 11/08/20 AT 1194 SK Performed at [facility], [place] [address] [place], [place] [address]     FURTHER DISCHARGE INSTRUCTIONS:  Get Medicines reviewed and adjusted: Please take all your medications with you for your next visit with your Primary MD  Laboratory/radiological data: Please request your Primary MD to go over all hospital tests and procedure/radiological results at the follow up, please ask your Primary MD to get all Hospital records sent to his/her office.  In some cases, they will be blood work, cultures and biopsy results pending at the time of your discharge. Please request that your primary care M.D. goes through all the records of your hospital data and follows up on these results.  Also Note the following: If  you experience worsening of your admission symptoms, develop shortness of breath, life threatening emergency, suicidal or homicidal thoughts you must seek medical attention immediately by calling 911 or calling your MD immediately  if symptoms less severe.  You must read complete instructions/literature along with all the possible adverse reactions/side effects for all the Medicines you take and that have been prescribed to you. Take any new Medicines after you have completely understood and accpet all the possible adverse reactions/side effects.   Do not drive when taking Pain medications or sleeping medications (Benzodaizepines)  Do not take more than prescribed Pain, Sleep and Anxiety  Medications. It is not advisable to combine anxiety,sleep and pain medications without talking with your primary care practitioner  Special Instructions: If you have smoked or chewed Tobacco  in the last 2 yrs please stop smoking, stop any regular Alcohol  and or any Recreational drug use.  Wear Seat belts while driving.  Please note: You were cared for by a hospitalist during your hospital stay. Once you are discharged, your primary care physician will handle any further medical issues. Please note that NO REFILLS for any discharge medications will be authorized once you are discharged, as it is imperative that you return to your primary care physician (or establish a relationship with a primary care physician if you do not have one) for your post hospital discharge needs so that they can reassess your need for medications and monitor your lab values.  Total Time spent coordinating discharge including counseling, education and face to face time equals 35 minutes.  SignedOren Binet 11/15/2020 10:29 AM

## 2020-11-12 NOTE — TOC Progression Note (Signed)
Transition of Care [facility]) - Progression Note    Patient Details  Name: [name] MRN: [id number] Date of Birth: [birth year]  Transition of Care [facility]) CM/SW [place], [place] Phone Number: 11/12/2020, 11:28 AM  Clinical Narrative:    No beds available at [facility] today. CSW will continue to follow for disposition planning.   Expected Discharge Plan: [place] Barriers to Discharge: Hospice Bed not available  Expected Discharge Plan and Services Expected Discharge Plan: [place] In-house Referral: Clinical Social Work   Post Acute Care Choice: [place] Living arrangements for the past 2 months: [place] Expected Discharge Date: 11/12/20                                     Social Determinants of Health (SDOH) Interventions    Readmission Risk Interventions Readmission Risk Prevention Plan 11/09/2020 10/18/2020  Transportation Screening Complete -  PCP or Specialist Appt within 3-5 Days - Complete  HRI or [place] - Complete  Social Work Consult for [place] Planning/Counseling - Complete  Palliative Care Screening - Complete  Medication Review Press photographer) Referral to Pharmacy Complete  PCP or Specialist appointment within 3-5 days of discharge Complete -  [place] or Home Care Consult Complete -  SW Recovery Care/Counseling Consult Complete -  Palliative Care Screening Not Applicable -  [place] Complete -  Some recent data might be hidden

## 2020-11-13 NOTE — Progress Notes (Signed)
PROGRESS NOTE        PATIENT DETAILS Name: [name] Age: 69 y.o. Sex: female Date of Birth: 1950-12-19 Admit Date: 11/07/2020 Admitting Physician Norval Morton, MD GEX:[id number], Rocco Pauls, NP  Brief Narrative: Patient is a 69 y.o. female with history of dementia, chronic diastolic heart failure, AVNRT s/p ablation, COPD, DM-2-who presented to the hospital with acute metabolic encephalopathy, AKI and possible cellulitis of her left lower extremity.  See below for further details.  Significant events: 11/22>> admit to [facility] metabolic encephalopathy/AKI/left lower extremity cellulitis.  Significant studies: 11/22>> CT head: Interval decrease in size of the left cerebral SDH. 11/22>> chest x-ray: No active disease 11/24>> bilateral lower extremity Doppler: No DVT.  Antimicrobial therapy: None  Microbiology data: 11/22>> blood culture: No growth  Procedures : None  Consults: None  DVT Prophylaxis : heparin injection 5,000 Units Start: 11/07/20 2200  Subjective: Hardly eating or drinking-pleasantly confused.  Awaiting residential hospice bed.  Assessment/Plan: Acute metabolic encephalopathy superimposed on advanced dementia: Acute metabolic encephalopathy likely due to AKI-suspect she is not that far from baseline.  Continues to be very pleasantly confused-and refusing to eat and refusing some of her medications.  Sepsis due to left lower extremity cellulitis:Sepsis physiology has resolved-soft tissue infection seems to have markedly improved on exam. Cultures negative so far.   Was treated with antimicrobial therapy-doxycycline discontinued on 11/27.  Does not require any further antimicrobial therapy on discharge.  GMW:NUUVOZ hemodynamically mediated-resolved with supportive care.  Edema/third spacing due to severe protein calorie malnutrition: Continue supportive care  GERD: No longer on PPI  Recent DGU:[id number]  decrease in size of SDH on most recent CT head-doubt further work-up required. During her most recent hospitalization-ED MD had discussed with neurosurgeon-Dr. Lora Havens intervention was recommended.  Advanced dementia: Continue Zyprexa/Cymbalta/Wellbutrin for comfort.  Failure to thrive syndrome:Very frail-poor oral intake-essentially bedbound-clearly slowly deteriorating. Per nursing staff-no significant oral intake for the past few days-refusing medications and food. Spoke with brother [name] over the phone-explained that she will likely continue to have severe failure to thrive syndrome-probably will require repeated hospitalizations-that will probably prolong her misery and suffering. Prognosis is obviously very poor. We discussed about hospice-he is agreeable to explore residential hospice options-suspect that her life expectancy with this poor oral intake is only a few weeks at best. Patient has very advanced dementia. Social work consulted-stable for discharge to residential hospice when bed is available.  DNR in place.  Diet: Diet Order            Diet - low sodium heart healthy           Diet heart healthy/carb modified Room service appropriate? Yes with Assist; Fluid consistency: Thin  Diet effective now                  Code Status:  DNR  Family Communication: Abner Greenspan [phone] on 11/11/2020   Disposition Plan: Status is: Inpatient  Remains inpatient appropriate because:Inpatient level of care appropriate due to severity of illness   Dispo: The patient is from: SNF              Anticipated d/c is to: SNF              Anticipated d/c date is: 1 day              Patient currently is  medically stable to d/c.   Barriers to Discharge: Awaiting residential hospice bed  Antimicrobial agents: Anti-infectives (From admission, onward)   Start     Dose/Rate Route Frequency Ordered Stop   11/09/20 1500  doxycycline (VIBRA-TABS) tablet 100 mg  Status:   Discontinued        100 mg Oral Every 12 hours 11/09/20 1414 11/12/20 1016   11/09/20 1200  vancomycin (VANCOREADY) IVPB 750 mg/150 mL  Status:  Discontinued        750 mg 150 mL/hr over 60 Minutes Intravenous Every 24 hours 11/09/20 0846 11/09/20 1414   11/08/20 1200  vancomycin (VANCOREADY) IVPB 500 mg/100 mL  Status:  Discontinued        500 mg 100 mL/hr over 60 Minutes Intravenous Every 24 hours 11/07/20 1033 11/09/20 0846   11/08/20 1000  cefTRIAXone (ROCEPHIN) 2 g in sodium chloride 0.9 % 100 mL IVPB  Status:  Discontinued        2 g 200 mL/hr over 30 Minutes Intravenous Every 24 hours 11/07/20 1850 11/09/20 1414   11/07/20 1045  vancomycin (VANCOREADY) IVPB 1250 mg/250 mL        1,250 mg 166.7 mL/hr over 90 Minutes Intravenous  Once 11/07/20 1033 11/07/20 1406   11/07/20 1030  vancomycin (VANCOCIN) IVPB 1000 mg/200 mL premix  Status:  Discontinued        1,000 mg 200 mL/hr over 60 Minutes Intravenous  Once 11/07/20 1026 11/07/20 1033   11/07/20 1030  cefTRIAXone (ROCEPHIN) 2 g in sodium chloride 0.9 % 100 mL IVPB        2 g 200 mL/hr over 30 Minutes Intravenous  Once 11/07/20 1026 11/07/20 1131       Time spent: 15 minutes-Greater than 50% of this time was spent in counseling, explanation of diagnosis, planning of further management, and coordination of care.  MEDICATIONS: Scheduled Meds: . (feeding supplement) PROSource Plus  30 mL Oral TID PC & HS  . buPROPion  150 mg Oral Daily  . Chlorhexidine Gluconate Cloth  6 each Topical Q0600  . DULoxetine  60 mg Oral Daily  . feeding supplement  237 mL Oral BID BM  . heparin  5,000 Units Subcutaneous Q8H  . mupirocin ointment  1 application Nasal BID  . OLANZapine zydis  2.5 mg Oral QHS  . sodium chloride flush  3 mL Intravenous Q12H   Continuous Infusions: PRN Meds:.acetaminophen **OR** [DISCONTINUED] acetaminophen, albuterol, morphine CONCENTRATE, OLANZapine zydis, [DISCONTINUED] ondansetron **OR** ondansetron (ZOFRAN)  IV   PHYSICAL EXAM: Vital signs: Vitals:   11/11/20 2111 11/12/20 0618 11/12/20 2129 11/13/20 0430  BP: 116/72 96/63 112/88 116/72  Pulse: 88 95 (!) 101 97  Resp: 18 20 20 20   Temp: 98.5 F (36.9 C) 98.2 F (36.8 C) 97.7 F (36.5 C) 98.3 F (36.8 C)  TempSrc: Axillary Axillary Axillary Axillary  SpO2: 98% 98% 96% 98%  Weight:       Filed Weights   11/07/20 2127 11/09/20 0517  Weight: 63.7 kg 64 kg   Body mass index is 25.81 kg/m.   Gen Exam: Very pleasantly confused but not in any distress.   I have personally reviewed following labs and imaging studies  LABORATORY DATA: CBC: Recent Labs  Lab 11/07/20 0907 11/07/20 0919 11/08/20 0050 11/09/20 0100 11/10/20 0226  WBC 19.2*  --  17.4* 10.5 9.9  NEUTROABS 16.9*  --   --  8.2* 7.7  HGB 9.4* 9.9* 9.2* 8.0* 9.2*  HCT 30.0* 29.0* 28.7* 25.4* 29.8*  MCV 97.4  --  96.6 97.3 97.7  PLT 285  --  261 187 161    Basic Metabolic Panel: Recent Labs  Lab 11/07/20 0907 11/07/20 0919 11/08/20 0050 11/09/20 0100 11/10/20 0226  NA 140 142 139 139 142  K 3.3* 3.2* 3.7 3.0* 3.9  CL 109  --  108 108 113*  CO2 17*  --  18* 19* 18*  GLUCOSE 174*  --  79 67* 119*  BUN 37*  --  33* 29* 22  CREATININE 2.09*  --  1.62* 1.13* 0.92  CALCIUM 7.6*  --  7.7* 7.4* 7.5*  MG 1.8  --  1.5* 1.8 1.6*  PHOS 5.8*  --   --   --   --     GFR: Estimated Creatinine Clearance: 50 mL/min (by C-G formula based on SCr of 0.92 mg/dL).  Liver Function Tests: Recent Labs  Lab 11/07/20 0907 11/08/20 0050 11/09/20 0100 11/10/20 0226  AST 45* 53* 46* 31  ALT 45* 43 44 44  ALKPHOS 105 117 96 98  BILITOT 1.3* 1.0 0.9 0.7  PROT 3.9* 3.7* 3.3* 3.6*  ALBUMIN 1.6* 1.5* 1.2* 1.3*   No results for input(s): LIPASE, AMYLASE in the last 168 hours. Recent Labs  Lab 11/07/20 0907  AMMONIA 17    Coagulation Profile: Recent Labs  Lab 11/07/20 0907  INR 1.4*    Cardiac Enzymes: Recent Labs  Lab 11/07/20 1335  CKTOTAL 287*    BNP  (last 3 results) No results for input(s): PROBNP in the last 8760 hours.  Lipid Profile: No results for input(s): CHOL, HDL, LDLCALC, TRIG, CHOLHDL, LDLDIRECT in the last 72 hours.  Thyroid Function Tests: No results for input(s): TSH, T4TOTAL, FREET4, T3FREE, THYROIDAB in the last 72 hours.  Anemia Panel: No results for input(s): VITAMINB12, FOLATE, FERRITIN, TIBC, IRON, RETICCTPCT in the last 72 hours.  Urine analysis:    Component Value Date/Time   COLORURINE AMBER (A) 11/07/2020 0806   APPEARANCEUR HAZY (A) 11/07/2020 0806   APPEARANCEUR Cloudy 01/16/2015 1515   LABSPEC 1.020 11/07/2020 0806   LABSPEC 1.014 01/16/2015 1515   PHURINE 5.0 11/07/2020 0806   GLUCOSEU NEGATIVE 11/07/2020 0806   GLUCOSEU Negative 01/16/2015 1515   HGBUR NEGATIVE 11/07/2020 0806   BILIRUBINUR NEGATIVE 11/07/2020 0806   BILIRUBINUR Negative 01/16/2015 1515   KETONESUR NEGATIVE 11/07/2020 0806   PROTEINUR NEGATIVE 11/07/2020 0806   UROBILINOGEN 0.2 03/30/2014 1415   NITRITE NEGATIVE 11/07/2020 0806   LEUKOCYTESUR NEGATIVE 11/07/2020 0806   LEUKOCYTESUR 2+ 01/16/2015 1515    Sepsis Labs: Lactic Acid, Venous    Component Value Date/Time   LATICACIDVEN 2.2 (HH) 11/07/2020 1713    MICROBIOLOGY: Recent Results (from the past 240 hour(s))  Culture, blood (routine x 2)     Status: None   Collection Time: 11/07/20 10:27 AM   Specimen: BLOOD  Result Value Ref Range Status   Specimen Description BLOOD BLOOD RIGHT HAND  Final   Special Requests   Final    BOTTLES DRAWN AEROBIC AND ANAEROBIC Blood Culture adequate volume   Culture   Final    NO GROWTH 5 DAYS Performed at [facility], [place] [address] [place] [address] [place], [place] [address]    Report Status 11/12/2020 FINAL  Final  Respiratory Panel by RT PCR (Flu A&B, Covid) - Nasopharyngeal Swab     Status: None   Collection Time: 11/07/20 10:30 AM   Specimen: Nasopharyngeal Swab; Nasopharyngeal(NP) swabs in vial transport medium  Result Value Ref  Range Status   SARS Coronavirus 2 by RT PCR NEGATIVE NEGATIVE Final    Comment: (NOTE) SARS-CoV-2 target nucleic acids are NOT DETECTED.  The SARS-CoV-2 RNA is generally detectable in upper respiratoy specimens during the acute phase of infection. The lowest concentration of SARS-CoV-2 viral copies this assay can detect is 131 copies/mL. A negative result does not preclude SARS-Cov-2 infection and should not be used as the sole basis for treatment or other patient management decisions. A negative result may occur with  improper specimen collection/handling, submission of specimen other than nasopharyngeal swab, presence of viral mutation(s) within the areas targeted by this assay, and inadequate number of viral copies (<131 copies/mL). A negative result must be combined with clinical observations, patient history, and epidemiological information. The expected result is Negative.  Fact Sheet for Patients:  PinkCheek.be  Fact Sheet for Healthcare Providers:  GravelBags.it  This test is no t yet approved or cleared by the [place] FDA and  has been authorized for detection and/or diagnosis of SARS-CoV-2 by FDA under an Emergency Use Authorization (EUA). This EUA will remain  in effect (meaning this test can be used) for the duration of the COVID-19 declaration under Section 564(b)(1) of the Act, 21 U.S.C. section 360bbb-3(b)(1), unless the authorization is terminated or revoked sooner.     Influenza A by PCR NEGATIVE NEGATIVE Final   Influenza B by PCR NEGATIVE NEGATIVE Final    Comment: (NOTE) The Xpert Xpress SARS-CoV-2/FLU/RSV assay is intended as an aid in  the diagnosis of influenza from Nasopharyngeal swab specimens and  should not be used as a sole basis for treatment. Nasal washings and  aspirates are unacceptable for Xpert Xpress SARS-CoV-2/FLU/RSV  testing.  Fact Sheet for  Patients: PinkCheek.be  Fact Sheet for Healthcare Providers: GravelBags.it  This test is not yet approved or cleared by the [place] FDA and  has been authorized for detection and/or diagnosis of SARS-CoV-2 by  FDA under an Emergency Use Authorization (EUA). This EUA will remain  in effect (meaning this test can be used) for the duration of the  Covid-19 declaration under Section 564(b)(1) of the Act, 21  U.S.C. section 360bbb-3(b)(1), unless the authorization is  terminated or revoked. Performed at [facility], [place] [address] [place], [place] [address]   Culture, blood (routine x 2)     Status: None   Collection Time: 11/07/20 10:41 AM   Specimen: BLOOD  Result Value Ref Range Status   Specimen Description BLOOD SITE NOT SPECIFIED  Final   Special Requests   Final    BOTTLES DRAWN AEROBIC AND ANAEROBIC Blood Culture results may not be optimal due to an excessive volume of blood received in culture bottles   Culture   Final    NO GROWTH 5 DAYS Performed at [facility], [place] [address] [place], [place] [address]    Report Status 11/12/2020 FINAL  Final  MRSA PCR Screening     Status: Abnormal   Collection Time: 11/08/20  2:17 PM   Specimen: Nasal Mucosa; Nasopharyngeal  Result Value Ref Range Status   MRSA by PCR POSITIVE (A) NEGATIVE Final    Comment:        The GeneXpert MRSA Assay (FDA approved for NASAL specimens only), is one component of a comprehensive MRSA colonization surveillance program. It is not intended to diagnose MRSA infection nor to guide or monitor treatment for MRSA infections. RESULT CALLED TO, READ BACK BY AND VERIFIED WITH: Alfonse Ras RN  11/08/20 AT 1646 SK Performed at [facility], [place] [address] [place], [place] [address]     RADIOLOGY STUDIES/RESULTS: No results found.   LOS: 6 days   Oren Binet, MD  [facility]    To contact the attending  provider between 7A-7P or the covering provider during after hours 7P-7A, please log into the web site [email or web address] and access using universal [place] password for that web site. If you do not have the password, please call the hospital operator.  11/13/2020, 11:42 AM

## 2020-11-13 NOTE — Progress Notes (Signed)
[facility] ([facility]) Hospital Liaison note.   Received request from TOC manager for family interest in [facility]. [facility] is unable to offer a room today.  Hospital Liaison will follow up tomorrow or sooner if a room becomes available.  Please do not hesitate to call with questions.   Thank you,  Melissa O'Bryant, BSN, RN  [facility] Hospital Liaison (listed on AMION under [facility] [place])   [phone]   

## 2020-11-13 NOTE — Progress Notes (Signed)
Pt had very little intake today. She did drink an entire ProSource, [place] that was mixed with a little water. This nurse used a 10cc syringe to put approx 5cc into her mouth at a time. Pt swallowed it well and was engaged, stating "oh that's good, yes I want more". She also had approx 4 spoonfuls of applesauce. Scheduled meds were crushed and given in the applesauce. Medicated patient with morphine SL(see MAR) for moaning and grimacing. Medication was effective. Offered more fluids via syringe throughout the shift, however, patient not as interested as this morning. No attempts to get out of the bed on her own and no impulsive behavior noted. Family (brother) was updated via phone and states will be visiting tomorrow.

## 2020-11-14 NOTE — Progress Notes (Addendum)
[facility] Liaison Note;  Unfortunately [facility] is not able to offer a room today. TOC aware and left message with patient family.  Family and CSW are aware Manufacturing engineer liaison will follow up with TOC and family tomorrow or sooner if room becomes available. Please do not hesitate to call with questions.   Thank you,   Gar Ponto, RN [facility] Liaison  (on [place]) [phone]

## 2020-11-14 NOTE — Progress Notes (Signed)
Physical Therapy Discharge Patient Details Name: [name] MRN: [id number] DOB: 06-11-1951 Today's Date: 11/14/2020 Time:  -     Patient discharged from PT services secondary to Pt transitioning to comfort care/hospice.  Please see latest therapy progress note for current level of functioning and progress toward goals.    Progress and discharge plan discussed with patient and/or caregiver: Patient unable to participate in discharge planning and no caregivers available  GP     [place] Pager [phone] Office [phone]  11/14/2020, 9:32 AM

## 2020-11-14 NOTE — Care Management Important Message (Signed)
Important Message  Patient Details  Name: [name] MRN: [id number] Date of Birth: [birth year]   Medicare Important Message Given:  Yes - Important Message mailed due to current National Emergency  Verbal consent obtained due to current National Emergency  Relationship to patient: Self Contact Name: Caryle Helgeson Call Date: 11/14/20  Time: 1539 Phone: [phone] Outcome: No Answer/Busy Important Message mailed to: Patient address on file    Delorse Lek 11/14/2020, 3:39 PM

## 2020-11-14 NOTE — Progress Notes (Signed)
PROGRESS NOTE        PATIENT DETAILS Name: [name] Age: 69 y.o. Sex: female Date of Birth: [birth year] Admit Date: 11/07/2020 Admitting Physician Norval Morton, MD EXH:[id number], Rocco Pauls, NP  Brief Narrative: Patient is a 69 y.o. female with history of dementia, chronic diastolic heart failure, AVNRT s/p ablation, COPD, DM-2-who presented to the hospital with acute metabolic encephalopathy, AKI and possible cellulitis of her left lower extremity.  See below for further details.  Significant events: 11/22>> admit to [facility] metabolic encephalopathy/AKI/left lower extremity cellulitis.  Significant studies: 11/22>> CT head: Interval decrease in size of the left cerebral SDH. 11/22>> chest x-ray: No active disease 11/24>> bilateral lower extremity Doppler: No DVT.  Antimicrobial therapy: None  Microbiology data: 11/22>> blood culture: No growth  Procedures : None  Consults: None  DVT Prophylaxis : heparin injection 5,000 Units Start: 11/07/20 2200  Subjective: Hardly any oral intake over the past few days-very pleasantly confused-awaiting residential hospice bed.  Assessment/Plan: Acute metabolic encephalopathy superimposed on advanced dementia: Acute metabolic encephalopathy likely due to AKI-suspect she is not that far from baseline.  Continues to be very pleasantly confused-and refusing to eat and refusing some of her medications.  Sepsis due to left lower extremity cellulitis:Sepsis physiology has resolved-soft tissue infection seems to have markedly improved on exam. Cultures negative so far.   Was treated with antimicrobial therapy-doxycycline discontinued on 11/27.  Does not require any further antimicrobial therapy on discharge.  ELF:YBOFBP hemodynamically mediated-resolved with supportive care.  Edema/third spacing due to severe protein calorie malnutrition: Continue supportive care  GERD: No longer on  PPI  Recent ZWC:HENIDPOE decrease in size of SDH on most recent CT head-doubt further work-up required. During her most recent hospitalization-ED MD had discussed with neurosurgeon-Dr. Lora Havens intervention was recommended.  Advanced dementia: Continue Zyprexa/Cymbalta/Wellbutrin for comfort.  Failure to thrive syndrome:Very frail-poor oral intake-essentially bedbound-clearly slowly deteriorating. Per nursing staff-no significant oral intake for the past few days-refusing medications and food. Spoke with brother [name] over the phone-explained that she will likely continue to have severe failure to thrive syndrome-probably will require repeated hospitalizations-that will probably prolong her misery and suffering. Prognosis is obviously very poor. We discussed about hospice-he is agreeable to explore residential hospice options-suspect that her life expectancy with this poor oral intake is only a few weeks at best. Patient has very advanced dementia. Social work consulted-stable for discharge to residential hospice when bed is available.  DNR in place.  Diet: Diet Order            Diet - low sodium heart healthy           Diet heart healthy/carb modified Room service appropriate? Yes with Assist; Fluid consistency: Thin  Diet effective now                  Code Status:  DNR  Family Communication: Abner Greenspan [phone] on 11/11/2020   Disposition Plan: Status is: Inpatient  Remains inpatient appropriate because:Inpatient level of care appropriate due to severity of illness   Dispo: The patient is from: SNF              Anticipated d/c is to: SNF              Anticipated d/c date is: 1 day  Patient currently is medically stable to d/c.   Barriers to Discharge: Awaiting residential hospice bed  Antimicrobial agents: Anti-infectives (From admission, onward)   Start     Dose/Rate Route Frequency Ordered Stop   11/09/20 1500  doxycycline  (VIBRA-TABS) tablet 100 mg  Status:  Discontinued        100 mg Oral Every 12 hours 11/09/20 1414 11/12/20 1016   11/09/20 1200  vancomycin (VANCOREADY) IVPB 750 mg/150 mL  Status:  Discontinued        750 mg 150 mL/hr over 60 Minutes Intravenous Every 24 hours 11/09/20 0846 11/09/20 1414   11/08/20 1200  vancomycin (VANCOREADY) IVPB 500 mg/100 mL  Status:  Discontinued        500 mg 100 mL/hr over 60 Minutes Intravenous Every 24 hours 11/07/20 1033 11/09/20 0846   11/08/20 1000  cefTRIAXone (ROCEPHIN) 2 g in sodium chloride 0.9 % 100 mL IVPB  Status:  Discontinued        2 g 200 mL/hr over 30 Minutes Intravenous Every 24 hours 11/07/20 1850 11/09/20 1414   11/07/20 1045  vancomycin (VANCOREADY) IVPB 1250 mg/250 mL        1,250 mg 166.7 mL/hr over 90 Minutes Intravenous  Once 11/07/20 1033 11/07/20 1406   11/07/20 1030  vancomycin (VANCOCIN) IVPB 1000 mg/200 mL premix  Status:  Discontinued        1,000 mg 200 mL/hr over 60 Minutes Intravenous  Once 11/07/20 1026 11/07/20 1033   11/07/20 1030  cefTRIAXone (ROCEPHIN) 2 g in sodium chloride 0.9 % 100 mL IVPB        2 g 200 mL/hr over 30 Minutes Intravenous  Once 11/07/20 1026 11/07/20 1131       Time spent: 15 minutes-Greater than 50% of this time was spent in counseling, explanation of diagnosis, planning of further management, and coordination of care.  MEDICATIONS: Scheduled Meds: . (feeding supplement) PROSource Plus  30 mL Oral TID PC & HS  . buPROPion  150 mg Oral Daily  . DULoxetine  60 mg Oral Daily  . feeding supplement  237 mL Oral BID BM  . heparin  5,000 Units Subcutaneous Q8H  . OLANZapine zydis  2.5 mg Oral QHS  . sodium chloride flush  3 mL Intravenous Q12H   Continuous Infusions: PRN Meds:.acetaminophen **OR** [DISCONTINUED] acetaminophen, albuterol, morphine CONCENTRATE, OLANZapine zydis, [DISCONTINUED] ondansetron **OR** ondansetron (ZOFRAN) IV   PHYSICAL EXAM: Vital signs: Vitals:   11/13/20 1223 11/13/20  2106 11/14/20 0515 11/14/20 0700  BP: 121/83 124/67 119/78 (!) 151/131  Pulse: 93 100 93 85  Resp: 20 20 16    Temp: 98.3 F (36.8 C) 99 F (37.2 C) 98.9 F (37.2 C)   TempSrc: Oral Axillary Axillary   SpO2: 92% 98% 91% 90%  Weight:       Filed Weights   11/07/20 2127 11/09/20 0517  Weight: 63.7 kg 64 kg   Body mass index is 25.81 kg/m.   Gen Exam: Very pleasantly confused but not in any distress.   I have personally reviewed following labs and imaging studies  LABORATORY DATA: CBC: Recent Labs  Lab 11/08/20 0050 11/09/20 0100 11/10/20 0226  WBC 17.4* 10.5 9.9  NEUTROABS  --  8.2* 7.7  HGB 9.2* 8.0* 9.2*  HCT 28.7* 25.4* 29.8*  MCV 96.6 97.3 97.7  PLT 261 187 778    Basic Metabolic Panel: Recent Labs  Lab 11/08/20 0050 11/09/20 0100 11/10/20 0226  NA 139 139 142  K 3.7 3.0* 3.9  CL 108 108 113*  CO2 18* 19* 18*  GLUCOSE 79 67* 119*  BUN 33* 29* 22  CREATININE 1.62* 1.13* 0.92  CALCIUM 7.7* 7.4* 7.5*  MG 1.5* 1.8 1.6*    GFR: Estimated Creatinine Clearance: 50 mL/min (by C-G formula based on SCr of 0.92 mg/dL).  Liver Function Tests: Recent Labs  Lab 11/08/20 0050 11/09/20 0100 11/10/20 0226  AST 53* 46* 31  ALT 43 44 44  ALKPHOS 117 96 98  BILITOT 1.0 0.9 0.7  PROT 3.7* 3.3* 3.6*  ALBUMIN 1.5* 1.2* 1.3*   No results for input(s): LIPASE, AMYLASE in the last 168 hours. No results for input(s): AMMONIA in the last 168 hours.  Coagulation Profile: No results for input(s): INR, PROTIME in the last 168 hours.  Cardiac Enzymes: Recent Labs  Lab 11/07/20 1335  CKTOTAL 287*    BNP (last 3 results) No results for input(s): PROBNP in the last 8760 hours.  Lipid Profile: No results for input(s): CHOL, HDL, LDLCALC, TRIG, CHOLHDL, LDLDIRECT in the last 72 hours.  Thyroid Function Tests: No results for input(s): TSH, T4TOTAL, FREET4, T3FREE, THYROIDAB in the last 72 hours.  Anemia Panel: No results for input(s): VITAMINB12, FOLATE,  FERRITIN, TIBC, IRON, RETICCTPCT in the last 72 hours.  Urine analysis:    Component Value Date/Time   COLORURINE AMBER (A) 11/07/2020 0806   APPEARANCEUR HAZY (A) 11/07/2020 0806   APPEARANCEUR Cloudy 01/16/2015 1515   LABSPEC 1.020 11/07/2020 0806   LABSPEC 1.014 01/16/2015 1515   PHURINE 5.0 11/07/2020 0806   GLUCOSEU NEGATIVE 11/07/2020 0806   GLUCOSEU Negative 01/16/2015 1515   HGBUR NEGATIVE 11/07/2020 0806   BILIRUBINUR NEGATIVE 11/07/2020 0806   BILIRUBINUR Negative 01/16/2015 1515   KETONESUR NEGATIVE 11/07/2020 0806   PROTEINUR NEGATIVE 11/07/2020 0806   UROBILINOGEN 0.2 03/30/2014 1415   NITRITE NEGATIVE 11/07/2020 0806   LEUKOCYTESUR NEGATIVE 11/07/2020 0806   LEUKOCYTESUR 2+ 01/16/2015 1515    Sepsis Labs: Lactic Acid, Venous    Component Value Date/Time   LATICACIDVEN 2.2 (HH) 11/07/2020 1713    MICROBIOLOGY: Recent Results (from the past 240 hour(s))  Culture, blood (routine x 2)     Status: None   Collection Time: 11/07/20 10:27 AM   Specimen: BLOOD  Result Value Ref Range Status   Specimen Description BLOOD BLOOD RIGHT HAND  Final   Special Requests   Final    BOTTLES DRAWN AEROBIC AND ANAEROBIC Blood Culture adequate volume   Culture   Final    NO GROWTH 5 DAYS Performed at [facility], [place] [address] [place], [place] [address]    Report Status 11/12/2020 FINAL  Final  Respiratory Panel by RT PCR (Flu A&B, Covid) - Nasopharyngeal Swab     Status: None   Collection Time: 11/07/20 10:30 AM   Specimen: Nasopharyngeal Swab; Nasopharyngeal(NP) swabs in vial transport medium  Result Value Ref Range Status   SARS Coronavirus 2 by RT PCR NEGATIVE NEGATIVE Final    Comment: (NOTE) SARS-CoV-2 target nucleic acids are NOT DETECTED.  The SARS-CoV-2 RNA is generally detectable in upper respiratoy specimens during the acute phase of infection. The lowest concentration of SARS-CoV-2 viral copies this assay can detect is 131 copies/mL. A negative  result does not preclude SARS-Cov-2 infection and should not be used as the sole basis for treatment or other patient management decisions. A negative result may occur with  improper specimen collection/handling, submission of specimen other than nasopharyngeal swab, presence of viral mutation(s) within the areas  targeted by this assay, and inadequate number of viral copies (<131 copies/mL). A negative result must be combined with clinical observations, patient history, and epidemiological information. The expected result is Negative.  Fact Sheet for Patients:  PinkCheek.be  Fact Sheet for Healthcare Providers:  GravelBags.it  This test is no t yet approved or cleared by the [place] FDA and  has been authorized for detection and/or diagnosis of SARS-CoV-2 by FDA under an Emergency Use Authorization (EUA). This EUA will remain  in effect (meaning this test can be used) for the duration of the COVID-19 declaration under Section 564(b)(1) of the Act, 21 U.S.C. section 360bbb-3(b)(1), unless the authorization is terminated or revoked sooner.     Influenza A by PCR NEGATIVE NEGATIVE Final   Influenza B by PCR NEGATIVE NEGATIVE Final    Comment: (NOTE) The Xpert Xpress SARS-CoV-2/FLU/RSV assay is intended as an aid in  the diagnosis of influenza from Nasopharyngeal swab specimens and  should not be used as a sole basis for treatment. Nasal washings and  aspirates are unacceptable for Xpert Xpress SARS-CoV-2/FLU/RSV  testing.  Fact Sheet for Patients: PinkCheek.be  Fact Sheet for Healthcare Providers: GravelBags.it  This test is not yet approved or cleared by the [place] FDA and  has been authorized for detection and/or diagnosis of SARS-CoV-2 by  FDA under an Emergency Use Authorization (EUA). This EUA will remain  in effect (meaning this test can be used)  for the duration of the  Covid-19 declaration under Section 564(b)(1) of the Act, 21  U.S.C. section 360bbb-3(b)(1), unless the authorization is  terminated or revoked. Performed at [facility], [place] [address] [place], [place] [address]   Culture, blood (routine x 2)     Status: None   Collection Time: 11/07/20 10:41 AM   Specimen: BLOOD  Result Value Ref Range Status   Specimen Description BLOOD SITE NOT SPECIFIED  Final   Special Requests   Final    BOTTLES DRAWN AEROBIC AND ANAEROBIC Blood Culture results may not be optimal due to an excessive volume of blood received in culture bottles   Culture   Final    NO GROWTH 5 DAYS Performed at [facility], [place] [address], [place], [place] [address]    Report Status 11/12/2020 FINAL  Final  MRSA PCR Screening     Status: Abnormal   Collection Time: 11/08/20  2:17 PM   Specimen: Nasal Mucosa; Nasopharyngeal  Result Value Ref Range Status   MRSA by PCR POSITIVE (A) NEGATIVE Final    Comment:        The GeneXpert MRSA Assay (FDA approved for NASAL specimens only), is one component of a comprehensive MRSA colonization surveillance program. It is not intended to diagnose MRSA infection nor to guide or monitor treatment for MRSA infections. RESULT CALLED TO, READ BACK BY AND VERIFIED WITH: Alfonse Ras RN 11/08/20 AT 5188 SK Performed at [facility], [place] [address], [place],  [address]     RADIOLOGY STUDIES/RESULTS: No results found.   LOS: 7 days   Oren Binet, MD  [facility]    To contact the attending provider between 7A-7P or the covering provider during after hours 7P-7A, please log into the web site [email or web address] and access using universal [place] password for that web site. If you do not have the password, please call the hospital operator.  11/14/2020, 9:30 AM

## 2020-11-14 NOTE — Progress Notes (Signed)
[facility] Liaison Note:   [place] has offered patient family a bed for transfer on 11/30. Patient brother [name] has agreed for transfer and will complete the registration paper work in the morning with our [facility] Social Worker. TOC aware of plan.  An [place] will notify the [facility] once the consents are done and patient is ready for transfer. At that time, Please fax discharge summary to [phone] and have the bedside RN call report to [phone]. Then please arrange transport for patient.   Thank you,   Gar Ponto, RN [facility] Liaison  [place] are on AMION

## 2020-11-14 NOTE — Progress Notes (Signed)
OT Cancellation Note  Patient Details Name: [name] MRN: [id number] DOB: 1951/11/09   Cancelled Treatment:    Reason Eval/Treat Not Completed: Other (comment) (Pt transitioning to hospice care. OT will respectfully sign off at this time, thank you for this consult.)  Zenovia Jarred, MSOT, OTR/L [facility] Office Number: [phone] Pager: [phone]  Zenovia Jarred 11/14/2020, 2:02 PM

## 2020-11-15 NOTE — Progress Notes (Signed)
  Called report to [facility] and spoke to [employer]. No questions at this time.

## 2020-11-15 NOTE — Progress Notes (Signed)
Brother of patient requesting for the patient to be tested for covid. MD is aware. Patient was tested less than a week ago.

## 2020-11-15 NOTE — Plan of Care (Signed)
  Problem: Education: Goal: Ability to state activities that reduce stress will improve Outcome: Not Progressing   Problem: Coping: Goal: Ability to identify and develop effective coping behavior will improve Outcome: Not Progressing   Problem: Self-Concept: Goal: Ability to identify factors that promote anxiety will improve Outcome: Not Progressing Goal: Level of anxiety will decrease Outcome: Not Progressing Goal: Ability to modify response to factors that promote anxiety will improve Outcome: Not Progressing   Problem: Education: Goal: Knowledge of General Education information will improve Description: Including pain rating scale, medication(s)/side effects and non-pharmacologic comfort measures Outcome: Not Progressing   Problem: Health Behavior/Discharge Planning: Goal: Ability to manage health-related needs will improve Outcome: Not Progressing   Problem: Clinical Measurements: Goal: Ability to maintain clinical measurements within normal limits will improve Outcome: Not Progressing Goal: Will remain free from infection Outcome: Not Progressing Goal: Diagnostic test results will improve Outcome: Not Progressing Goal: Respiratory complications will improve Outcome: Not Progressing Goal: Cardiovascular complication will be avoided Outcome: Not Progressing   Problem: Activity: Goal: Risk for activity intolerance will decrease Outcome: Not Progressing   Problem: Nutrition: Goal: Adequate nutrition will be maintained Outcome: Not Progressing   Problem: Coping: Goal: Level of anxiety will decrease Outcome: Not Progressing   Problem: Elimination: Goal: Will not experience complications related to bowel motility Outcome: Not Progressing Goal: Will not experience complications related to urinary retention Outcome: Not Progressing   Problem: Pain Managment: Goal: General experience of comfort will improve Outcome: Not Progressing   Problem: Safety: Goal:  Ability to remain free from injury will improve Outcome: Not Progressing   Problem: Skin Integrity: Goal: Risk for impaired skin integrity will decrease Outcome: Not Progressing

## 2020-11-15 NOTE — TOC Transition Note (Signed)
Transition of Care [facility]) - CM/SW Discharge Note   Patient Details  Name: [name] MRN: [id number] Date of Birth: 1951-04-02  Transition of [facility] CM/SW Contact:  Benard Halsted, LCSW Phone Number: 11/15/2020, 11:52 AM   Clinical Narrative:    Patient will DC to: [facility] Anticipated DC date: 11/15/20 Family notified: Brother, [name] Transport by: Corey Harold    Per MD patient ready for DC to Hospice. RN to call report prior to discharge [phone] ). RN, patient, patient's family, and facility notified of DC. Discharge Summary sent to facility. DC packet on chart. Ambulance transport requested for patient.   CSW will sign off for now as social work intervention is no longer needed. Please consult [place] again if new needs arise.      Final next level of care: [place] Barriers to Discharge: Barriers Resolved   Patient Goals and CMS Choice Patient states their goals for this hospitalization and ongoing recovery are:: get better CMS Medicare.gov Compare Post Acute Care list provided to:: Patient Represenative (must comment) (Brother) Choice offered to / list presented to : Sibling  Discharge Placement                Patient to be transferred to facility by: [place] Name of family member notified: Brother Patient and family notified of of transfer: 11/15/20  Discharge Plan and Services In-house Referral: Clinical Social Work   Post Acute Care Choice: [place]                               Social Determinants of Health ([place]) Interventions     Readmission Risk Interventions Readmission Risk Prevention Plan 11/09/2020 10/18/2020  Transportation Screening Complete -  PCP or Specialist Appt within 3-5 Days - Complete  HRI or [place] - Complete  Social Work Consult for [place] Planning/Counseling - Complete  Palliative Care Screening - Complete  Medication Review Press photographer) Referral to  Pharmacy Complete  PCP or Specialist appointment within 3-5 days of discharge Complete -  [place] or Home Care Consult Complete -  SW Recovery Care/Counseling Consult Complete -  Palliative Care Screening Not Applicable -  [place] Complete -  Some recent data might be hidden

## 2020-11-15 NOTE — Progress Notes (Addendum)
Manufacturing engineer [facility]) Hospital Liaison note.   **Addendum: Consents are complete at this time.  [place] is read yto receive pt. TOC made aware.**  Spoke with brother [name] to confirm interest and explain services. Family agreeable to transfer today. TOC aware.    [facility] will notify TOC when registration paperwork has been completed to arrange transport.   RN please call report to [phone].  Please be sure the signed [facility] DNR form transports with the patient.  Thank you for the opportunity to participate in this patient's care.  Chrislyn Edison Pace, BSN, RN [place] (listed on [place] under Hospice/[facility])    [phone]

## 2020-11-15 NOTE — Progress Notes (Signed)
PROGRESS NOTE        PATIENT DETAILS Name: [name] Age: 69 y.o. Sex: female Date of Birth: May 25, 1951 Admit Date: 11/07/2020 Admitting Physician Norval Morton, MD FYB:[id number], Rocco Pauls, NP  Brief Narrative: Patient is a 69 y.o. female with history of dementia, chronic diastolic heart failure, AVNRT s/p ablation, COPD, DM-2-who presented to the hospital with acute metabolic encephalopathy, AKI and possible cellulitis of her left lower extremity.  See below for further details.  Significant events: 11/22>> admit to [facility] metabolic encephalopathy/AKI/left lower extremity cellulitis.  Significant studies: 11/22>> CT head: Interval decrease in size of the left cerebral SDH. 11/22>> chest x-ray: No active disease 11/24>> bilateral lower extremity Doppler: No DVT.  Antimicrobial therapy: None  Microbiology data: 11/22>> blood culture: No growth  Procedures : None  Consults: None  DVT Prophylaxis : heparin injection 5,000 Units Start: 11/07/20 2200  Subjective: No major issues-lying comfortably in bed-not any distress-for residential hospice today  Assessment/Plan: Acute metabolic encephalopathy superimposed on advanced dementia: Acute metabolic encephalopathy likely due to AKI-suspect she is not that far from baseline.  Continues to be very pleasantly confused-and refusing to eat and refusing some of her medications.  Sepsis due to left lower extremity cellulitis:Sepsis physiology has resolved-soft tissue infection seems to have markedly improved on exam. Cultures negative so far.   Was treated with antimicrobial therapy-doxycycline discontinued on 11/27.  Does not require any further antimicrobial therapy on discharge.  HEN:IDPOEU hemodynamically mediated-resolved with supportive care.  Edema/third spacing due to severe protein calorie malnutrition: Continue supportive care  GERD: No longer on PPI  Recent  MPN:TIRWERXV decrease in size of SDH on most recent CT head-doubt further work-up required. During her most recent hospitalization-ED MD had discussed with neurosurgeon-Dr. Lora Havens intervention was recommended.  Advanced dementia: Continue Zyprexa/Cymbalta/Wellbutrin for comfort.  Failure to thrive syndrome:Very frail-poor oral intake-essentially bedbound-clearly slowly deteriorating. Per nursing staff-no significant oral intake for the past few days-refusing medications and food. Spoke with brother [name] over the phone-explained that she will likely continue to have severe failure to thrive syndrome-probably will require repeated hospitalizations-that will probably prolong her misery and suffering. Prognosis is obviously very poor. We discussed about hospice-he is agreeable to explore residential hospice options-suspect that her life expectancy with this poor oral intake is only a few weeks at best. Patient has very advanced dementia. Social work consulted-stable for discharge to residential hospice when bed is available.  DNR in place.  Diet: Diet Order            Diet - low sodium heart healthy           Diet heart healthy/carb modified Room service appropriate? Yes with Assist; Fluid consistency: Thin  Diet effective now                  Code Status:  DNR  Family Communication: Abner Greenspan [phone] on 11/11/2020   Disposition Plan: Status is: Inpatient  Remains inpatient appropriate because:Inpatient level of care appropriate due to severity of illness   Dispo: The patient is from: SNF              Anticipated d/c is to: SNF              Anticipated d/c date is: 1 day              Patient currently  is medically stable to d/c.   Barriers to Discharge: Awaiting residential hospice bed  Antimicrobial agents: Anti-infectives (From admission, onward)   Start     Dose/Rate Route Frequency Ordered Stop   11/09/20 1500  doxycycline (VIBRA-TABS) tablet 100  mg  Status:  Discontinued        100 mg Oral Every 12 hours 11/09/20 1414 11/12/20 1016   11/09/20 1200  vancomycin (VANCOREADY) IVPB 750 mg/150 mL  Status:  Discontinued        750 mg 150 mL/hr over 60 Minutes Intravenous Every 24 hours 11/09/20 0846 11/09/20 1414   11/08/20 1200  vancomycin (VANCOREADY) IVPB 500 mg/100 mL  Status:  Discontinued        500 mg 100 mL/hr over 60 Minutes Intravenous Every 24 hours 11/07/20 1033 11/09/20 0846   11/08/20 1000  cefTRIAXone (ROCEPHIN) 2 g in sodium chloride 0.9 % 100 mL IVPB  Status:  Discontinued        2 g 200 mL/hr over 30 Minutes Intravenous Every 24 hours 11/07/20 1850 11/09/20 1414   11/07/20 1045  vancomycin (VANCOREADY) IVPB 1250 mg/250 mL        1,250 mg 166.7 mL/hr over 90 Minutes Intravenous  Once 11/07/20 1033 11/07/20 1406   11/07/20 1030  vancomycin (VANCOCIN) IVPB 1000 mg/200 mL premix  Status:  Discontinued        1,000 mg 200 mL/hr over 60 Minutes Intravenous  Once 11/07/20 1026 11/07/20 1033   11/07/20 1030  cefTRIAXone (ROCEPHIN) 2 g in sodium chloride 0.9 % 100 mL IVPB        2 g 200 mL/hr over 30 Minutes Intravenous  Once 11/07/20 1026 11/07/20 1131       Time spent: 15 minutes-Greater than 50% of this time was spent in counseling, explanation of diagnosis, planning of further management, and coordination of care.  MEDICATIONS: Scheduled Meds: . (feeding supplement) PROSource Plus  30 mL Oral TID PC & HS  . buPROPion  150 mg Oral Daily  . DULoxetine  60 mg Oral Daily  . feeding supplement  237 mL Oral BID BM  . heparin  5,000 Units Subcutaneous Q8H  . OLANZapine zydis  2.5 mg Oral QHS  . sodium chloride flush  3 mL Intravenous Q12H   Continuous Infusions: PRN Meds:.acetaminophen **OR** [DISCONTINUED] acetaminophen, albuterol, morphine CONCENTRATE, OLANZapine zydis, [DISCONTINUED] ondansetron **OR** ondansetron (ZOFRAN) IV   PHYSICAL EXAM: Vital signs: Vitals:   11/14/20 0700 11/14/20 1100 11/14/20 2112  11/15/20 0507  BP: (!) 151/131 (!) 155/104 (!) 114/58 112/86  Pulse: 85 94 99 95  Resp:   17 16  Temp: (!) 97.3 F (36.3 C) 98.2 F (36.8 C) 97.8 F (36.6 C) 98 F (36.7 C)  TempSrc: Axillary  Axillary Oral  SpO2: 90% 92% 92% 94%  Weight: 64 kg     Height: 5\' 2"  (1.575 m)      Filed Weights   11/07/20 2127 11/09/20 0517 11/14/20 0700  Weight: 63.7 kg 64 kg 64 kg   Body mass index is 25.81 kg/m.   Gen Exam: Very pleasantly confused but not in any distress.   I have personally reviewed following labs and imaging studies  LABORATORY DATA: CBC: Recent Labs  Lab 11/09/20 0100 11/10/20 0226  WBC 10.5 9.9  NEUTROABS 8.2* 7.7  HGB 8.0* 9.2*  HCT 25.4* 29.8*  MCV 97.3 97.7  PLT 187 654    Basic Metabolic Panel: Recent Labs  Lab 11/09/20 0100 11/10/20 0226  NA 139 142  K 3.0* 3.9  CL 108 113*  CO2 19* 18*  GLUCOSE 67* 119*  BUN 29* 22  CREATININE 1.13* 0.92  CALCIUM 7.4* 7.5*  MG 1.8 1.6*    GFR: Estimated Creatinine Clearance: 50 mL/min (by C-G formula based on SCr of 0.92 mg/dL).  Liver Function Tests: Recent Labs  Lab 11/09/20 0100 11/10/20 0226  AST 46* 31  ALT 44 44  ALKPHOS 96 98  BILITOT 0.9 0.7  PROT 3.3* 3.6*  ALBUMIN 1.2* 1.3*   No results for input(s): LIPASE, AMYLASE in the last 168 hours. No results for input(s): AMMONIA in the last 168 hours.  Coagulation Profile: No results for input(s): INR, PROTIME in the last 168 hours.  Cardiac Enzymes: No results for input(s): CKTOTAL, CKMB, CKMBINDEX, TROPONINI in the last 168 hours.  BNP (last 3 results) No results for input(s): PROBNP in the last 8760 hours.  Lipid Profile: No results for input(s): CHOL, HDL, LDLCALC, TRIG, CHOLHDL, LDLDIRECT in the last 72 hours.  Thyroid Function Tests: No results for input(s): TSH, T4TOTAL, FREET4, T3FREE, THYROIDAB in the last 72 hours.  Anemia Panel: No results for input(s): VITAMINB12, FOLATE, FERRITIN, TIBC, IRON, RETICCTPCT in the last 72  hours.  Urine analysis:    Component Value Date/Time   COLORURINE AMBER (A) 11/07/2020 0806   APPEARANCEUR HAZY (A) 11/07/2020 0806   APPEARANCEUR Cloudy 01/16/2015 1515   LABSPEC 1.020 11/07/2020 0806   LABSPEC 1.014 01/16/2015 1515   PHURINE 5.0 11/07/2020 0806   GLUCOSEU NEGATIVE 11/07/2020 0806   GLUCOSEU Negative 01/16/2015 1515   HGBUR NEGATIVE 11/07/2020 0806   BILIRUBINUR NEGATIVE 11/07/2020 0806   BILIRUBINUR Negative 01/16/2015 1515   KETONESUR NEGATIVE 11/07/2020 0806   PROTEINUR NEGATIVE 11/07/2020 0806   UROBILINOGEN 0.2 03/30/2014 1415   NITRITE NEGATIVE 11/07/2020 0806   LEUKOCYTESUR NEGATIVE 11/07/2020 0806   LEUKOCYTESUR 2+ 01/16/2015 1515    Sepsis Labs: Lactic Acid, Venous    Component Value Date/Time   LATICACIDVEN 2.2 (HH) 11/07/2020 1713    MICROBIOLOGY: Recent Results (from the past 240 hour(s))  Culture, blood (routine x 2)     Status: None   Collection Time: 11/07/20 10:27 AM   Specimen: BLOOD  Result Value Ref Range Status   Specimen Description BLOOD BLOOD RIGHT HAND  Final   Special Requests   Final    BOTTLES DRAWN AEROBIC AND ANAEROBIC Blood Culture adequate volume   Culture   Final    NO GROWTH 5 DAYS Performed at [facility], [place] [address] [place], [place] [address]    Report Status 11/12/2020 FINAL  Final  Respiratory Panel by RT PCR (Flu A&B, Covid) - Nasopharyngeal Swab     Status: None   Collection Time: 11/07/20 10:30 AM   Specimen: Nasopharyngeal Swab; Nasopharyngeal(NP) swabs in vial transport medium  Result Value Ref Range Status   SARS Coronavirus 2 by RT PCR NEGATIVE NEGATIVE Final    Comment: (NOTE) SARS-CoV-2 target nucleic acids are NOT DETECTED.  The SARS-CoV-2 RNA is generally detectable in upper respiratoy specimens during the acute phase of infection. The lowest concentration of SARS-CoV-2 viral copies this assay can detect is 131 copies/mL. A negative result does not preclude SARS-Cov-2 infection and  should not be used as the sole basis for treatment or other patient management decisions. A negative result may occur with  improper specimen collection/handling, submission of specimen other than nasopharyngeal swab, presence of viral mutation(s) within the areas targeted by this assay, and inadequate number of viral  copies (<131 copies/mL). A negative result must be combined with clinical observations, patient history, and epidemiological information. The expected result is Negative.  Fact Sheet for Patients:  PinkCheek.be  Fact Sheet for Healthcare Providers:  GravelBags.it  This test is no t yet approved or cleared by the [place] FDA and  has been authorized for detection and/or diagnosis of SARS-CoV-2 by FDA under an Emergency Use Authorization (EUA). This EUA will remain  in effect (meaning this test can be used) for the duration of the COVID-19 declaration under Section 564(b)(1) of the Act, 21 U.S.C. section 360bbb-3(b)(1), unless the authorization is terminated or revoked sooner.     Influenza A by PCR NEGATIVE NEGATIVE Final   Influenza B by PCR NEGATIVE NEGATIVE Final    Comment: (NOTE) The Xpert Xpress SARS-CoV-2/FLU/RSV assay is intended as an aid in  the diagnosis of influenza from Nasopharyngeal swab specimens and  should not be used as a sole basis for treatment. Nasal washings and  aspirates are unacceptable for Xpert Xpress SARS-CoV-2/FLU/RSV  testing.  Fact Sheet for Patients: PinkCheek.be  Fact Sheet for Healthcare Providers: GravelBags.it  This test is not yet approved or cleared by the [place] FDA and  has been authorized for detection and/or diagnosis of SARS-CoV-2 by  FDA under an Emergency Use Authorization (EUA). This EUA will remain  in effect (meaning this test can be used) for the duration of the  Covid-19 declaration  under Section 564(b)(1) of the Act, 21  U.S.C. section 360bbb-3(b)(1), unless the authorization is  terminated or revoked. Performed at [facility], [place] [address], [place], [place] [address]   Culture, blood (routine x 2)     Status: None   Collection Time: 11/07/20 10:41 AM   Specimen: BLOOD  Result Value Ref Range Status   Specimen Description BLOOD SITE NOT SPECIFIED  Final   Special Requests   Final    BOTTLES DRAWN AEROBIC AND ANAEROBIC Blood Culture results may not be optimal due to an excessive volume of blood received in culture bottles   Culture   Final    NO GROWTH 5 DAYS Performed at [facility], [place] [address] [place], [place] [address]    Report Status 11/12/2020 FINAL  Final  MRSA PCR Screening     Status: Abnormal   Collection Time: 11/08/20  2:17 PM   Specimen: Nasal Mucosa; Nasopharyngeal  Result Value Ref Range Status   MRSA by PCR POSITIVE (A) NEGATIVE Final    Comment:        The GeneXpert MRSA Assay (FDA approved for NASAL specimens only), is one component of a comprehensive MRSA colonization surveillance program. It is not intended to diagnose MRSA infection nor to guide or monitor treatment for MRSA infections. RESULT CALLED TO, READ BACK BY AND VERIFIED WITH: Alfonse Ras RN 11/08/20 AT 7939 SK Performed at [facility], [place] [address], [place],  [address]     RADIOLOGY STUDIES/RESULTS: No results found.   LOS: 8 days   Oren Binet, MD  [facility]    To contact the attending provider between 7A-7P or the covering provider during after hours 7P-7A, please log into the web site [email or web address] and access using universal [place] password for that web site. If you do not have the password, please call the hospital operator.  11/15/2020, 10:28 AM

## 2020-12-17 DEATH — deceased

## 2022-06-13 IMAGING — CT CT HEAD W/O CM
3 series · 15 of 47 positions shown, 18 images · non-contrast
Comparison: 09/01/2017

CLINICAL DATA: Unwitnessed fall

EXAM:
CT HEAD WITHOUT CONTRAST
TECHNIQUE: Contiguous axial images were obtained from the base of the skull
through the vertex without intravenous contrast.

[Series 3: head wo · axial · 0.47mm/px · z∈[-128,+22]mm · 9 of 36 slices shown, 12 images]
[im 3/36  brain]
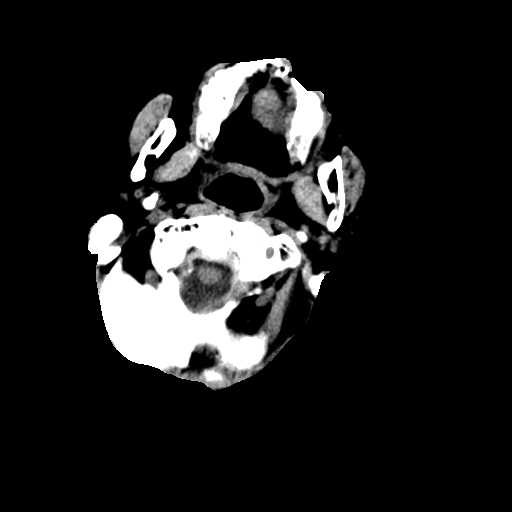
[im 3/36  bone]
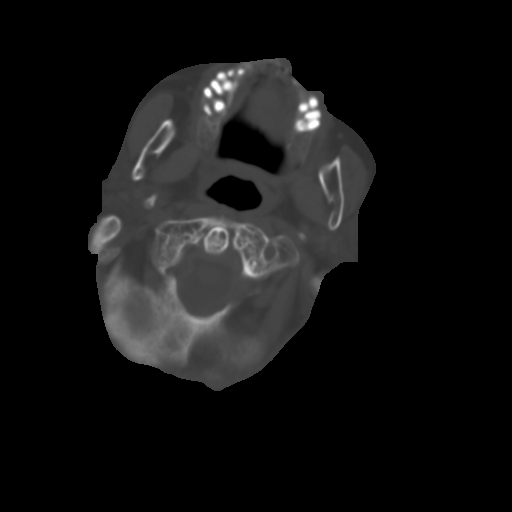
[im 7/36  brain]
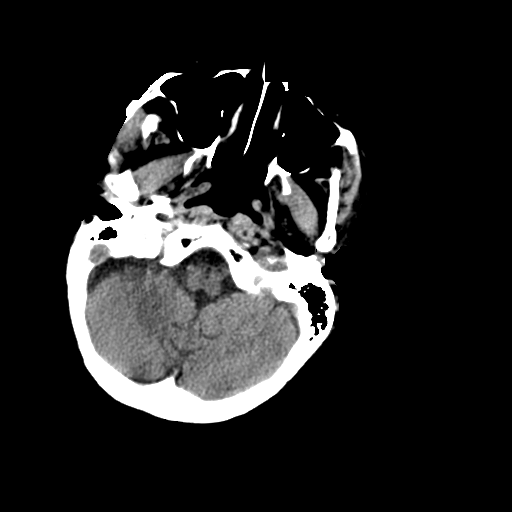
[im 10/36  brain]
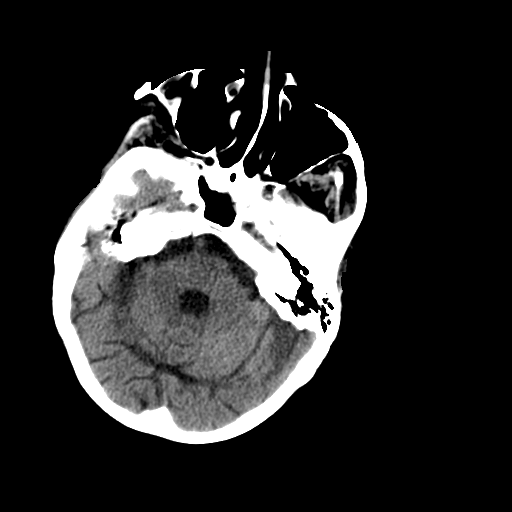
[im 14/36  brain]
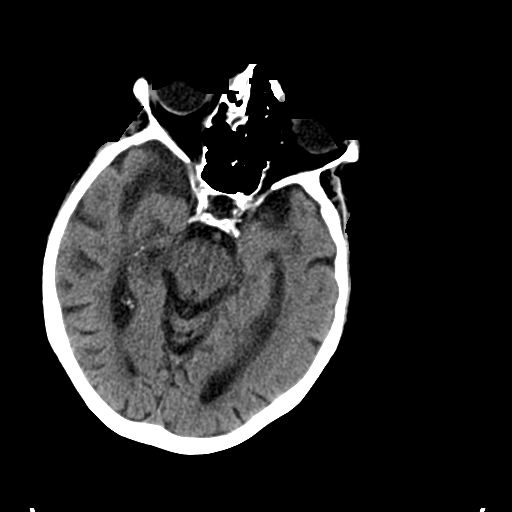
[im 19/36  brain]
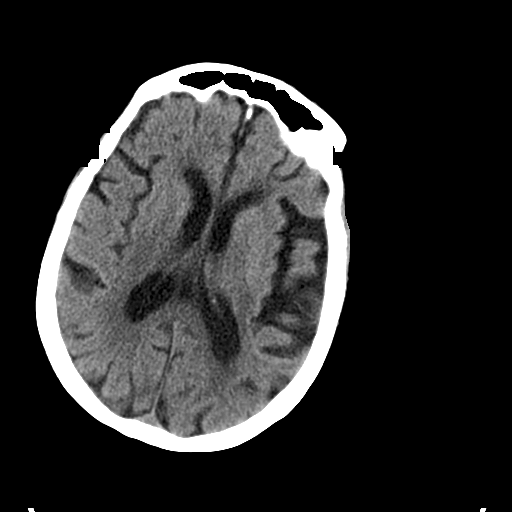
[im 19/36  bone]
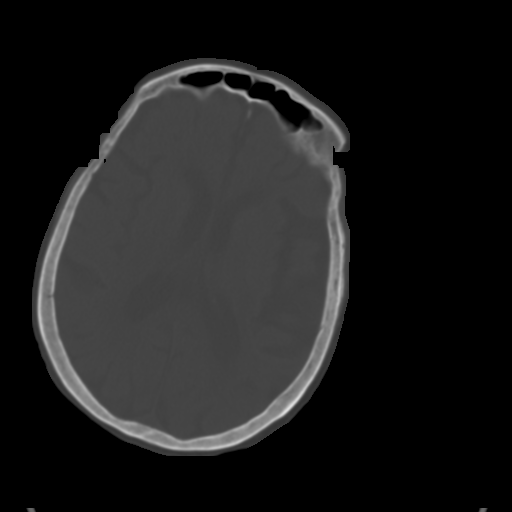
[im 22/36  brain]
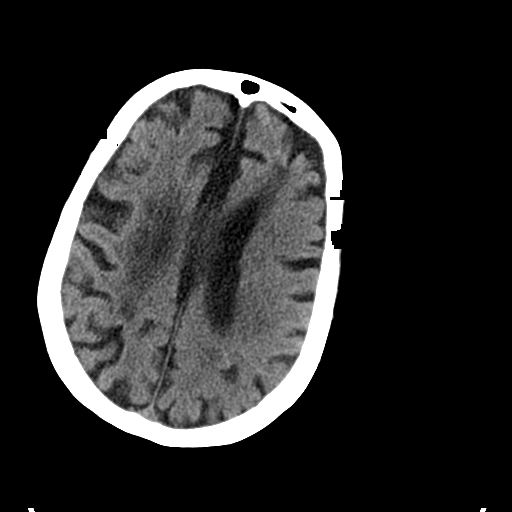
[im 26/36  brain]
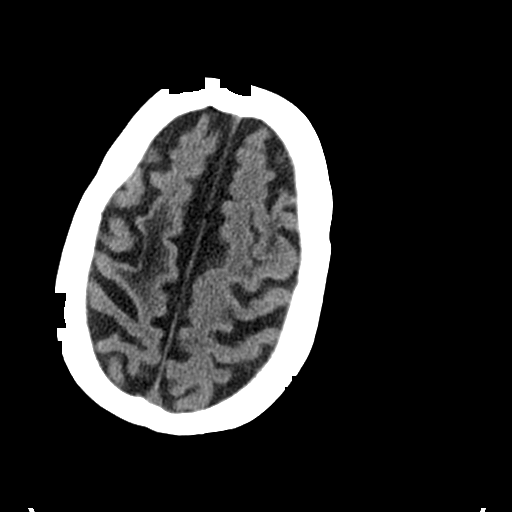
[im 29/36  brain]
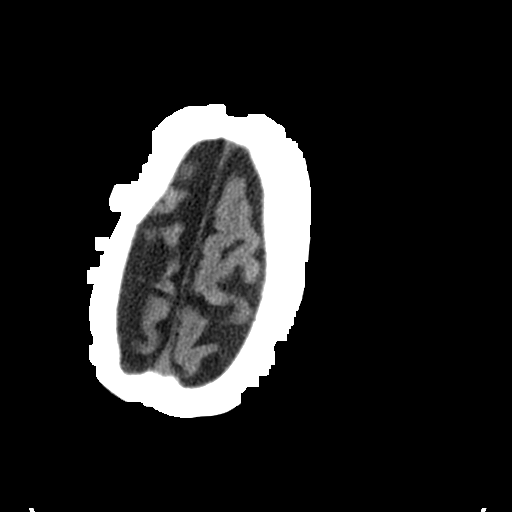
[im 33/36  brain]
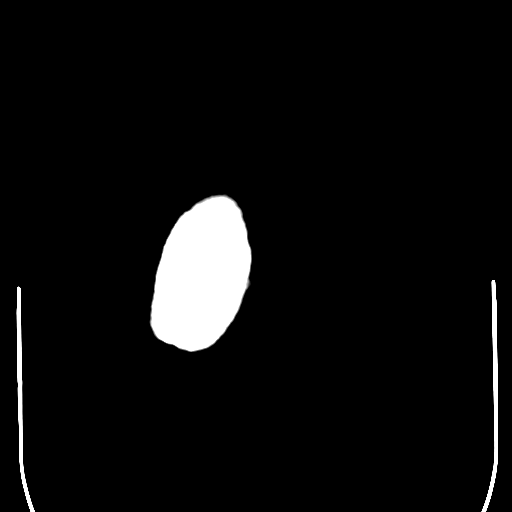
[im 33/36  bone]
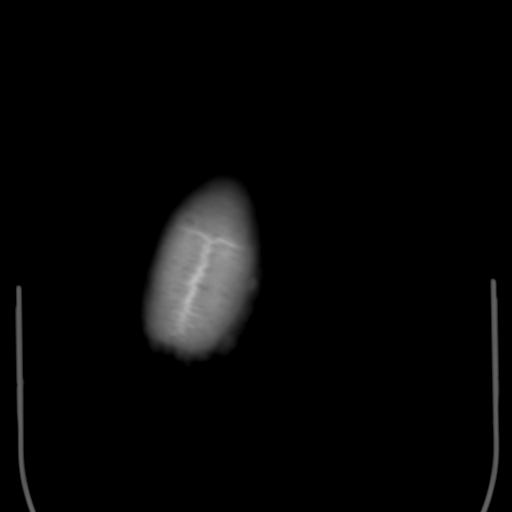

[Series 6: coronal soft tissue · coronal · 0.34mm/px · 3 of 73 slices shown]
[im 25/73  brain]
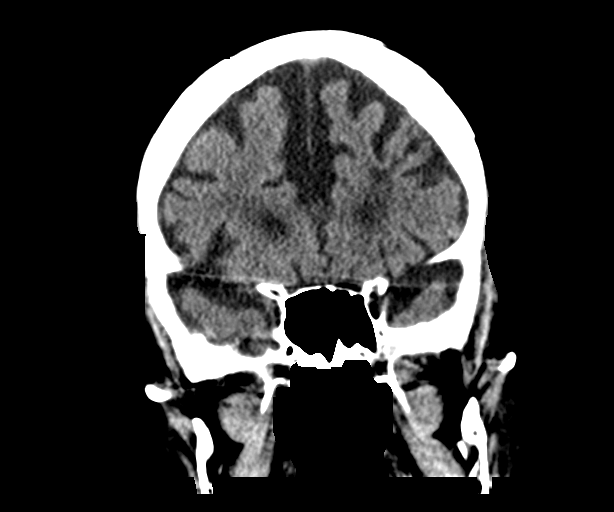
[im 33/73  brain]
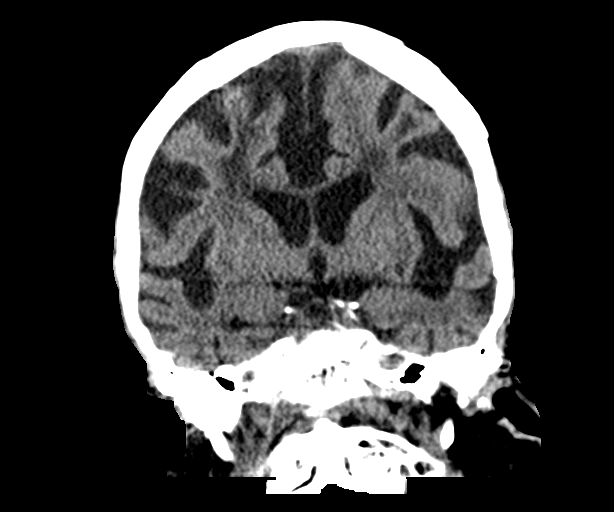
[im 41/73  brain]
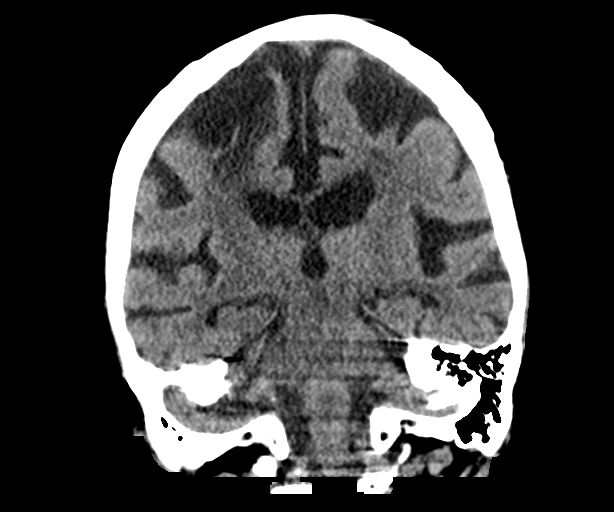

[Series 7: sagittal soft tissue · sagittal · 0.34mm/px · 3 of 57 slices shown]
[im 19/57  brain]
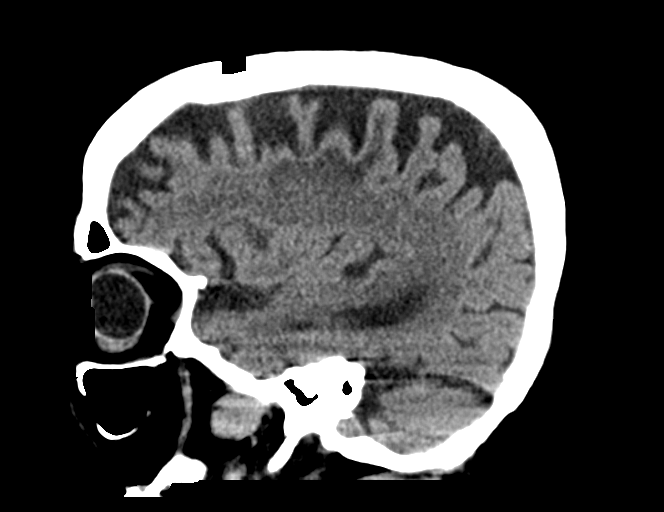
[im 29/57  brain]
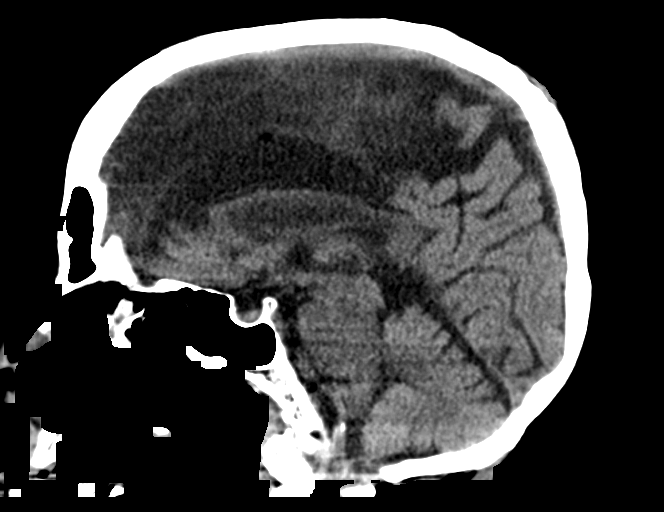
[im 38/57  brain]
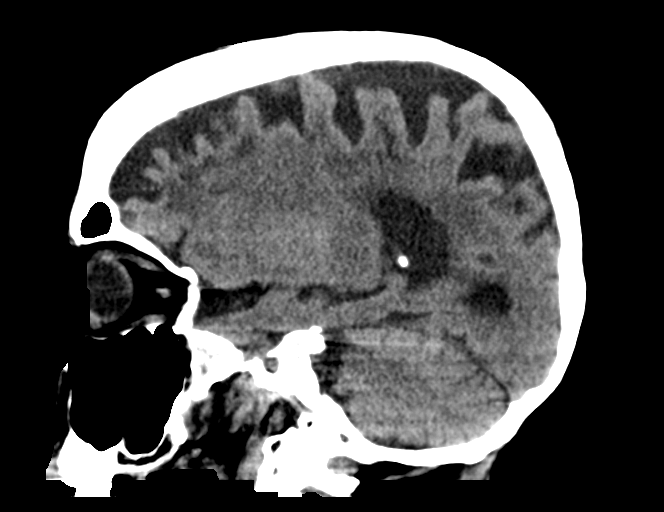

[15 of 47 positions shown; findings below may reference images not displayed]

FINDINGS: Brain: Old right frontal infarct. There is atrophy and chronic small
vessel disease changes. No acute intracranial abnormality.
Specifically, no hemorrhage, hydrocephalus, mass lesion, acute
infarction, or significant intracranial injury.

Vascular: No hyperdense vessel or unexpected calcification.

Skull: No acute calvarial abnormality.

Sinuses/Orbits: No acute findings

Other: None
IMPRESSION: Old right frontal infarct.

Atrophy, chronic microvascular disease.

No acute intracranial abnormality.
